# Patient Record
Sex: Female | Born: 1965 | Race: Black or African American | Hispanic: No | Smoking: Never smoker
Health system: Southern US, Community
[De-identification: ages and names within clinical notes are randomized; demographics above are authoritative.]

## PROBLEM LIST (undated history)

## (undated) DIAGNOSIS — M6282 Rhabdomyolysis: Secondary | ICD-10-CM

## (undated) DIAGNOSIS — I509 Heart failure, unspecified: Secondary | ICD-10-CM

## (undated) DIAGNOSIS — F819 Developmental disorder of scholastic skills, unspecified: Secondary | ICD-10-CM

## (undated) DIAGNOSIS — M1990 Unspecified osteoarthritis, unspecified site: Secondary | ICD-10-CM

## (undated) DIAGNOSIS — R413 Other amnesia: Secondary | ICD-10-CM

## (undated) DIAGNOSIS — I428 Other cardiomyopathies: Secondary | ICD-10-CM

## (undated) DIAGNOSIS — K589 Irritable bowel syndrome without diarrhea: Secondary | ICD-10-CM

## (undated) DIAGNOSIS — F419 Anxiety disorder, unspecified: Secondary | ICD-10-CM

## (undated) DIAGNOSIS — G9341 Metabolic encephalopathy: Secondary | ICD-10-CM

## (undated) DIAGNOSIS — A419 Sepsis, unspecified organism: Secondary | ICD-10-CM

## (undated) DIAGNOSIS — R109 Unspecified abdominal pain: Secondary | ICD-10-CM

## (undated) DIAGNOSIS — T7840XA Allergy, unspecified, initial encounter: Secondary | ICD-10-CM

## (undated) DIAGNOSIS — E785 Hyperlipidemia, unspecified: Secondary | ICD-10-CM

## (undated) DIAGNOSIS — F319 Bipolar disorder, unspecified: Secondary | ICD-10-CM

## (undated) DIAGNOSIS — K5900 Constipation, unspecified: Secondary | ICD-10-CM

## (undated) DIAGNOSIS — F32A Depression, unspecified: Secondary | ICD-10-CM

## (undated) DIAGNOSIS — F329 Major depressive disorder, single episode, unspecified: Secondary | ICD-10-CM

## (undated) DIAGNOSIS — I639 Cerebral infarction, unspecified: Secondary | ICD-10-CM

## (undated) DIAGNOSIS — G709 Myoneural disorder, unspecified: Secondary | ICD-10-CM

## (undated) DIAGNOSIS — I5042 Chronic combined systolic (congestive) and diastolic (congestive) heart failure: Secondary | ICD-10-CM

## (undated) DIAGNOSIS — G8929 Other chronic pain: Secondary | ICD-10-CM

## (undated) DIAGNOSIS — I10 Essential (primary) hypertension: Secondary | ICD-10-CM

## (undated) HISTORY — DX: Heart failure, unspecified: I50.9

## (undated) HISTORY — DX: Sepsis, unspecified organism: A41.9

## (undated) HISTORY — DX: Metabolic encephalopathy: G93.41

## (undated) HISTORY — DX: Other cardiomyopathies: I42.8

## (undated) HISTORY — DX: Rhabdomyolysis: M62.82

## (undated) HISTORY — DX: Bipolar disorder, unspecified: F31.9

## (undated) HISTORY — DX: Major depressive disorder, single episode, unspecified: F32.9

## (undated) HISTORY — DX: Chronic combined systolic (congestive) and diastolic (congestive) heart failure: I50.42

## (undated) HISTORY — PX: ABDOMINAL HYSTERECTOMY: SHX81

## (undated) HISTORY — DX: Unspecified osteoarthritis, unspecified site: M19.90

## (undated) HISTORY — DX: Hyperlipidemia, unspecified: E78.5

## (undated) HISTORY — DX: Cerebral infarction, unspecified: I63.9

## (undated) HISTORY — PX: GALLBLADDER SURGERY: SHX652

## (undated) HISTORY — DX: Allergy, unspecified, initial encounter: T78.40XA

## (undated) HISTORY — PX: KNEE SURGERY: SHX244

## (undated) HISTORY — DX: Myoneural disorder, unspecified: G70.9

---

## 1999-01-30 ENCOUNTER — Emergency Department (HOSPITAL_COMMUNITY): Admission: EM | Admit: 1999-01-30 | Discharge: 1999-01-30 | Payer: Self-pay | Admitting: Internal Medicine

## 1999-06-27 ENCOUNTER — Emergency Department (HOSPITAL_COMMUNITY): Admission: EM | Admit: 1999-06-27 | Discharge: 1999-06-27 | Payer: Self-pay | Admitting: Emergency Medicine

## 1999-09-26 ENCOUNTER — Emergency Department (HOSPITAL_COMMUNITY): Admission: EM | Admit: 1999-09-26 | Discharge: 1999-09-26 | Payer: Self-pay | Admitting: Emergency Medicine

## 1999-10-15 ENCOUNTER — Other Ambulatory Visit: Admission: RE | Admit: 1999-10-15 | Discharge: 1999-10-15 | Payer: Self-pay | Admitting: Family Medicine

## 1999-10-24 ENCOUNTER — Emergency Department (HOSPITAL_COMMUNITY): Admission: EM | Admit: 1999-10-24 | Discharge: 1999-10-24 | Payer: Self-pay

## 1999-10-31 ENCOUNTER — Encounter: Payer: Self-pay | Admitting: Family Medicine

## 1999-10-31 ENCOUNTER — Ambulatory Visit (HOSPITAL_COMMUNITY): Admission: RE | Admit: 1999-10-31 | Discharge: 1999-10-31 | Payer: Self-pay | Admitting: Family Medicine

## 1999-11-14 ENCOUNTER — Emergency Department (HOSPITAL_COMMUNITY): Admission: EM | Admit: 1999-11-14 | Discharge: 1999-11-14 | Payer: Self-pay

## 2000-07-01 ENCOUNTER — Emergency Department (HOSPITAL_COMMUNITY): Admission: EM | Admit: 2000-07-01 | Discharge: 2000-07-01 | Payer: Self-pay | Admitting: Emergency Medicine

## 2000-07-01 ENCOUNTER — Encounter: Payer: Self-pay | Admitting: Emergency Medicine

## 2000-12-13 ENCOUNTER — Emergency Department (HOSPITAL_COMMUNITY): Admission: EM | Admit: 2000-12-13 | Discharge: 2000-12-14 | Payer: Self-pay | Admitting: Emergency Medicine

## 2000-12-14 ENCOUNTER — Encounter: Payer: Self-pay | Admitting: Emergency Medicine

## 2000-12-29 ENCOUNTER — Ambulatory Visit (HOSPITAL_COMMUNITY): Admission: RE | Admit: 2000-12-29 | Discharge: 2000-12-29 | Payer: Self-pay | Admitting: Family Medicine

## 2000-12-29 ENCOUNTER — Encounter: Payer: Self-pay | Admitting: Family Medicine

## 2001-02-15 ENCOUNTER — Emergency Department (HOSPITAL_COMMUNITY): Admission: EM | Admit: 2001-02-15 | Discharge: 2001-02-15 | Payer: Self-pay | Admitting: Emergency Medicine

## 2001-02-15 ENCOUNTER — Encounter: Payer: Self-pay | Admitting: Emergency Medicine

## 2001-06-23 ENCOUNTER — Encounter: Admission: RE | Admit: 2001-06-23 | Discharge: 2001-06-23 | Payer: Self-pay | Admitting: Internal Medicine

## 2001-07-20 ENCOUNTER — Inpatient Hospital Stay (HOSPITAL_COMMUNITY): Admission: AD | Admit: 2001-07-20 | Discharge: 2001-07-20 | Payer: Self-pay | Admitting: Obstetrics

## 2001-09-08 ENCOUNTER — Encounter: Admission: RE | Admit: 2001-09-08 | Discharge: 2001-09-08 | Payer: Self-pay

## 2002-01-05 ENCOUNTER — Encounter: Admission: RE | Admit: 2002-01-05 | Discharge: 2002-01-05 | Payer: Self-pay | Admitting: Internal Medicine

## 2002-05-19 ENCOUNTER — Other Ambulatory Visit: Admission: RE | Admit: 2002-05-19 | Discharge: 2002-05-19 | Payer: Self-pay | Admitting: Family Medicine

## 2002-11-18 ENCOUNTER — Encounter: Payer: Self-pay | Admitting: Emergency Medicine

## 2002-11-18 ENCOUNTER — Emergency Department (HOSPITAL_COMMUNITY): Admission: EM | Admit: 2002-11-18 | Discharge: 2002-11-18 | Payer: Self-pay | Admitting: Emergency Medicine

## 2003-01-20 ENCOUNTER — Emergency Department (HOSPITAL_COMMUNITY): Admission: EM | Admit: 2003-01-20 | Discharge: 2003-01-20 | Payer: Self-pay | Admitting: Emergency Medicine

## 2003-02-15 ENCOUNTER — Other Ambulatory Visit: Admission: RE | Admit: 2003-02-15 | Discharge: 2003-02-15 | Payer: Self-pay | Admitting: Family Medicine

## 2003-02-17 ENCOUNTER — Encounter: Admission: RE | Admit: 2003-02-17 | Discharge: 2003-03-14 | Payer: Self-pay | Admitting: Family Medicine

## 2003-05-01 ENCOUNTER — Emergency Department (HOSPITAL_COMMUNITY): Admission: EM | Admit: 2003-05-01 | Discharge: 2003-05-01 | Payer: Self-pay | Admitting: Emergency Medicine

## 2003-05-23 ENCOUNTER — Emergency Department (HOSPITAL_COMMUNITY): Admission: EM | Admit: 2003-05-23 | Discharge: 2003-05-23 | Payer: Self-pay

## 2003-05-23 ENCOUNTER — Encounter: Payer: Self-pay | Admitting: Emergency Medicine

## 2003-05-26 ENCOUNTER — Other Ambulatory Visit: Admission: RE | Admit: 2003-05-26 | Discharge: 2003-05-26 | Payer: Self-pay | Admitting: *Deleted

## 2003-08-31 ENCOUNTER — Encounter: Admission: RE | Admit: 2003-08-31 | Discharge: 2003-08-31 | Payer: Self-pay | Admitting: *Deleted

## 2003-10-19 ENCOUNTER — Ambulatory Visit (HOSPITAL_COMMUNITY): Admission: RE | Admit: 2003-10-19 | Discharge: 2003-10-19 | Payer: Self-pay | Admitting: Obstetrics & Gynecology

## 2005-01-23 ENCOUNTER — Inpatient Hospital Stay (HOSPITAL_COMMUNITY): Admission: RE | Admit: 2005-01-23 | Discharge: 2005-01-30 | Payer: Self-pay | Admitting: Psychiatry

## 2005-01-23 ENCOUNTER — Ambulatory Visit: Payer: Self-pay | Admitting: Psychiatry

## 2005-01-23 ENCOUNTER — Encounter: Payer: Self-pay | Admitting: Emergency Medicine

## 2005-10-19 ENCOUNTER — Emergency Department (HOSPITAL_COMMUNITY): Admission: EM | Admit: 2005-10-19 | Discharge: 2005-10-20 | Payer: Self-pay | Admitting: Emergency Medicine

## 2006-05-20 ENCOUNTER — Emergency Department (HOSPITAL_COMMUNITY): Admission: EM | Admit: 2006-05-20 | Discharge: 2006-05-20 | Payer: Self-pay | Admitting: Emergency Medicine

## 2006-06-08 ENCOUNTER — Ambulatory Visit (HOSPITAL_COMMUNITY): Admission: RE | Admit: 2006-06-08 | Discharge: 2006-06-08 | Payer: Self-pay | Admitting: Obstetrics

## 2006-06-25 ENCOUNTER — Encounter (INDEPENDENT_AMBULATORY_CARE_PROVIDER_SITE_OTHER): Payer: Self-pay | Admitting: Specialist

## 2006-06-25 ENCOUNTER — Inpatient Hospital Stay (HOSPITAL_COMMUNITY): Admission: RE | Admit: 2006-06-25 | Discharge: 2006-06-28 | Payer: Self-pay | Admitting: Obstetrics

## 2007-07-28 ENCOUNTER — Emergency Department (HOSPITAL_COMMUNITY): Admission: EM | Admit: 2007-07-28 | Discharge: 2007-07-29 | Payer: Self-pay | Admitting: Emergency Medicine

## 2007-09-04 ENCOUNTER — Encounter: Admission: RE | Admit: 2007-09-04 | Discharge: 2007-09-04 | Payer: Self-pay | Admitting: Family Medicine

## 2007-12-24 ENCOUNTER — Emergency Department (HOSPITAL_COMMUNITY): Admission: EM | Admit: 2007-12-24 | Discharge: 2007-12-24 | Payer: Self-pay | Admitting: Emergency Medicine

## 2008-01-03 ENCOUNTER — Emergency Department (HOSPITAL_COMMUNITY): Admission: EM | Admit: 2008-01-03 | Discharge: 2008-01-03 | Payer: Self-pay | Admitting: Emergency Medicine

## 2008-03-28 ENCOUNTER — Emergency Department (HOSPITAL_COMMUNITY): Admission: EM | Admit: 2008-03-28 | Discharge: 2008-03-28 | Payer: Self-pay | Admitting: Emergency Medicine

## 2008-03-30 ENCOUNTER — Ambulatory Visit (HOSPITAL_COMMUNITY): Admission: RE | Admit: 2008-03-30 | Discharge: 2008-03-30 | Payer: Self-pay | Admitting: Emergency Medicine

## 2008-06-13 ENCOUNTER — Emergency Department (HOSPITAL_COMMUNITY): Admission: EM | Admit: 2008-06-13 | Discharge: 2008-06-14 | Payer: Self-pay | Admitting: Emergency Medicine

## 2008-06-30 ENCOUNTER — Ambulatory Visit (HOSPITAL_COMMUNITY): Admission: RE | Admit: 2008-06-30 | Discharge: 2008-06-30 | Payer: Self-pay | Admitting: Obstetrics

## 2008-10-09 ENCOUNTER — Ambulatory Visit (HOSPITAL_COMMUNITY): Admission: RE | Admit: 2008-10-09 | Discharge: 2008-10-09 | Payer: Self-pay | Admitting: Obstetrics

## 2008-12-28 ENCOUNTER — Encounter: Payer: Self-pay | Admitting: Physician Assistant

## 2009-01-02 ENCOUNTER — Encounter: Admission: RE | Admit: 2009-01-02 | Discharge: 2009-01-02 | Payer: Self-pay | Admitting: Gastroenterology

## 2009-05-10 ENCOUNTER — Emergency Department (HOSPITAL_COMMUNITY): Admission: EM | Admit: 2009-05-10 | Discharge: 2009-05-11 | Payer: Self-pay | Admitting: Emergency Medicine

## 2009-06-20 ENCOUNTER — Emergency Department (HOSPITAL_COMMUNITY): Admission: EM | Admit: 2009-06-20 | Discharge: 2009-06-20 | Payer: Self-pay | Admitting: Emergency Medicine

## 2009-06-29 ENCOUNTER — Telehealth: Payer: Self-pay | Admitting: Gastroenterology

## 2009-07-03 ENCOUNTER — Ambulatory Visit: Payer: Self-pay | Admitting: Gastroenterology

## 2009-07-03 ENCOUNTER — Telehealth (INDEPENDENT_AMBULATORY_CARE_PROVIDER_SITE_OTHER): Payer: Self-pay | Admitting: *Deleted

## 2009-07-03 DIAGNOSIS — Z860100 Personal history of colon polyps, unspecified: Secondary | ICD-10-CM | POA: Insufficient documentation

## 2009-07-03 DIAGNOSIS — K5732 Diverticulitis of large intestine without perforation or abscess without bleeding: Secondary | ICD-10-CM | POA: Insufficient documentation

## 2009-07-03 DIAGNOSIS — J90 Pleural effusion, not elsewhere classified: Secondary | ICD-10-CM | POA: Insufficient documentation

## 2009-07-03 DIAGNOSIS — I10 Essential (primary) hypertension: Secondary | ICD-10-CM | POA: Insufficient documentation

## 2009-07-03 DIAGNOSIS — R1032 Left lower quadrant pain: Secondary | ICD-10-CM | POA: Insufficient documentation

## 2009-07-03 DIAGNOSIS — I517 Cardiomegaly: Secondary | ICD-10-CM | POA: Insufficient documentation

## 2009-07-03 DIAGNOSIS — R109 Unspecified abdominal pain: Secondary | ICD-10-CM | POA: Insufficient documentation

## 2009-07-04 DIAGNOSIS — N760 Acute vaginitis: Secondary | ICD-10-CM | POA: Insufficient documentation

## 2009-07-04 DIAGNOSIS — F411 Generalized anxiety disorder: Secondary | ICD-10-CM | POA: Insufficient documentation

## 2009-07-04 DIAGNOSIS — N83209 Unspecified ovarian cyst, unspecified side: Secondary | ICD-10-CM

## 2009-07-04 LAB — CONVERTED CEMR LAB
Basophils Relative: 0.9 % (ref 0.0–3.0)
Eosinophils Absolute: 0.5 10*3/uL (ref 0.0–0.7)
Hemoglobin: 14.4 g/dL (ref 12.0–15.0)
Lymphs Abs: 2.9 10*3/uL (ref 0.7–4.0)
MCHC: 34.4 g/dL (ref 30.0–36.0)
MCV: 96.7 fL (ref 78.0–100.0)
Monocytes Absolute: 0.6 10*3/uL (ref 0.1–1.0)
Neutro Abs: 2.8 10*3/uL (ref 1.4–7.7)
RBC: 4.32 M/uL (ref 3.87–5.11)

## 2009-07-05 ENCOUNTER — Ambulatory Visit: Payer: Self-pay | Admitting: Cardiovascular Disease

## 2009-07-05 DIAGNOSIS — R0602 Shortness of breath: Secondary | ICD-10-CM

## 2009-07-10 ENCOUNTER — Telehealth: Payer: Self-pay | Admitting: Gastroenterology

## 2009-07-16 ENCOUNTER — Ambulatory Visit: Payer: Self-pay

## 2009-07-16 ENCOUNTER — Ambulatory Visit: Payer: Self-pay | Admitting: Cardiology

## 2009-07-16 ENCOUNTER — Encounter: Payer: Self-pay | Admitting: Cardiovascular Disease

## 2009-07-16 ENCOUNTER — Ambulatory Visit (HOSPITAL_COMMUNITY): Admission: RE | Admit: 2009-07-16 | Discharge: 2009-07-16 | Payer: Self-pay | Admitting: Cardiovascular Disease

## 2009-07-17 ENCOUNTER — Telehealth: Payer: Self-pay | Admitting: Gastroenterology

## 2009-07-21 ENCOUNTER — Telehealth: Payer: Self-pay | Admitting: Gastroenterology

## 2009-07-24 ENCOUNTER — Telehealth (INDEPENDENT_AMBULATORY_CARE_PROVIDER_SITE_OTHER): Payer: Self-pay | Admitting: *Deleted

## 2009-07-24 ENCOUNTER — Ambulatory Visit: Payer: Self-pay | Admitting: Gastroenterology

## 2009-07-24 LAB — CONVERTED CEMR LAB
AST: 16 units/L (ref 0–37)
Albumin: 4.3 g/dL (ref 3.5–5.2)
Alkaline Phosphatase: 49 units/L (ref 39–117)
BUN: 6 mg/dL (ref 6–23)
Eosinophils Absolute: 0.3 10*3/uL (ref 0.0–0.7)
MCHC: 34.6 g/dL (ref 30.0–36.0)
MCV: 94.7 fL (ref 78.0–100.0)
Monocytes Absolute: 0.4 10*3/uL (ref 0.1–1.0)
Neutrophils Relative %: 50.8 % (ref 43.0–77.0)
Platelets: 254 10*3/uL (ref 150.0–400.0)
Potassium: 3.2 meq/L — ABNORMAL LOW (ref 3.5–5.1)
Sodium: 144 meq/L (ref 135–145)
Total Bilirubin: 1.4 mg/dL — ABNORMAL HIGH (ref 0.3–1.2)

## 2009-07-27 ENCOUNTER — Telehealth (INDEPENDENT_AMBULATORY_CARE_PROVIDER_SITE_OTHER): Payer: Self-pay | Admitting: *Deleted

## 2009-07-27 ENCOUNTER — Ambulatory Visit (HOSPITAL_COMMUNITY): Admission: RE | Admit: 2009-07-27 | Discharge: 2009-07-27 | Payer: Self-pay | Admitting: Gastroenterology

## 2009-07-30 ENCOUNTER — Telehealth (INDEPENDENT_AMBULATORY_CARE_PROVIDER_SITE_OTHER): Payer: Self-pay | Admitting: *Deleted

## 2009-08-01 ENCOUNTER — Ambulatory Visit (HOSPITAL_COMMUNITY): Admission: RE | Admit: 2009-08-01 | Discharge: 2009-08-01 | Payer: Self-pay | Admitting: Gastroenterology

## 2009-08-02 ENCOUNTER — Telehealth: Payer: Self-pay | Admitting: Internal Medicine

## 2009-08-03 ENCOUNTER — Telehealth: Payer: Self-pay | Admitting: Gastroenterology

## 2009-08-09 ENCOUNTER — Telehealth: Payer: Self-pay | Admitting: Gastroenterology

## 2009-08-09 ENCOUNTER — Emergency Department (HOSPITAL_COMMUNITY): Admission: EM | Admit: 2009-08-09 | Discharge: 2009-08-09 | Payer: Self-pay | Admitting: Emergency Medicine

## 2009-09-28 ENCOUNTER — Emergency Department (HOSPITAL_COMMUNITY): Admission: EM | Admit: 2009-09-28 | Discharge: 2009-09-29 | Payer: Self-pay | Admitting: Emergency Medicine

## 2009-10-12 ENCOUNTER — Ambulatory Visit (HOSPITAL_BASED_OUTPATIENT_CLINIC_OR_DEPARTMENT_OTHER): Admission: RE | Admit: 2009-10-12 | Discharge: 2009-10-12 | Payer: Self-pay | Admitting: Urology

## 2010-01-01 ENCOUNTER — Ambulatory Visit (HOSPITAL_BASED_OUTPATIENT_CLINIC_OR_DEPARTMENT_OTHER): Admission: RE | Admit: 2010-01-01 | Discharge: 2010-01-01 | Payer: Self-pay | Admitting: Urology

## 2010-02-18 IMAGING — CT CT PELVIS W/ CM
2 of 5 series · 17 of 46 positions shown, 19 images · IV contrast (APPLIED)
Comparison: [DATE].

CT ABDOMEN

CLINICAL DATA: Left-sided abdominal pain.

CT ABDOMEN AND PELVIS WITH CONTRAST
TECHNIQUE: Multidetector CT imaging of the abdomen and pelvis was
performed using the standard protocol following bolus
administration of intravenous contrast.
Contrast: 100 ml Omnipaque 300.

[Series 2: abd/pelv with 5.0 b31f st · axial · 0.71mm/px · z∈[-474,-44]mm · 14 of 98 slices shown, 16 images]
[im 6/98  soft-tissue]
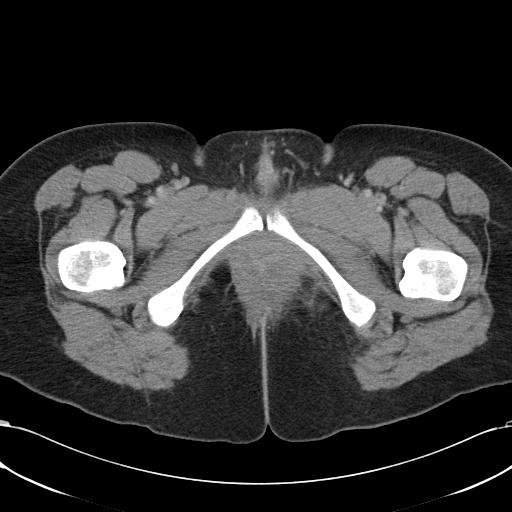
[im 6/98  bone]
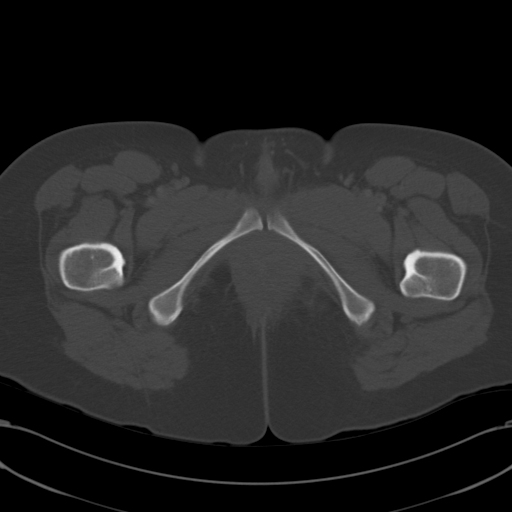
[im 11/98  soft-tissue]
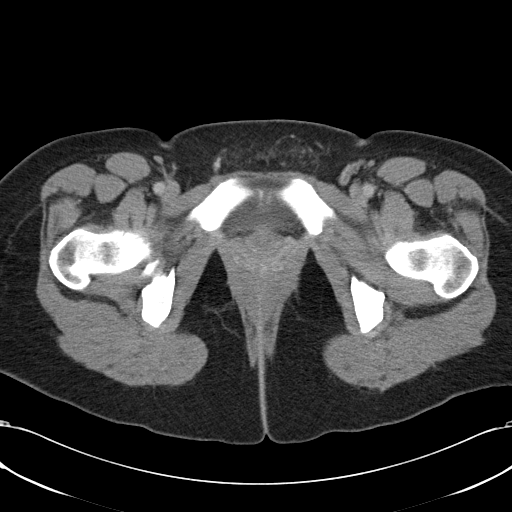
[im 21/98  soft-tissue]
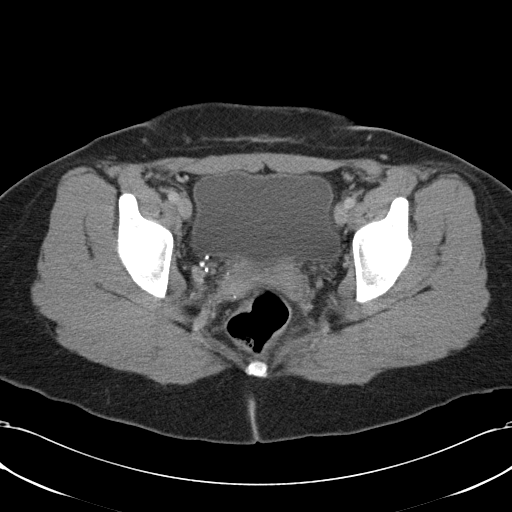
[im 26/98  soft-tissue]
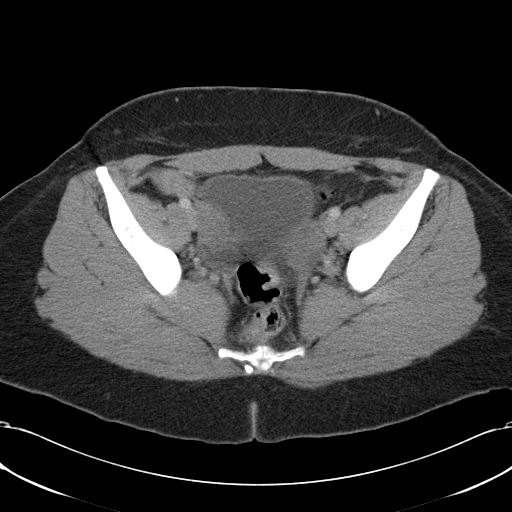
[im 31/98  soft-tissue]
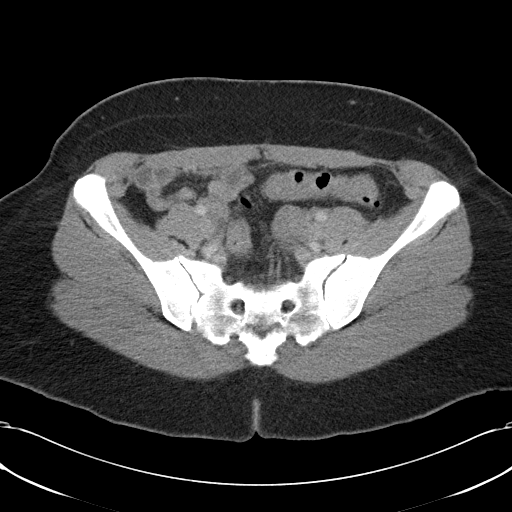
[im 41/98  soft-tissue]
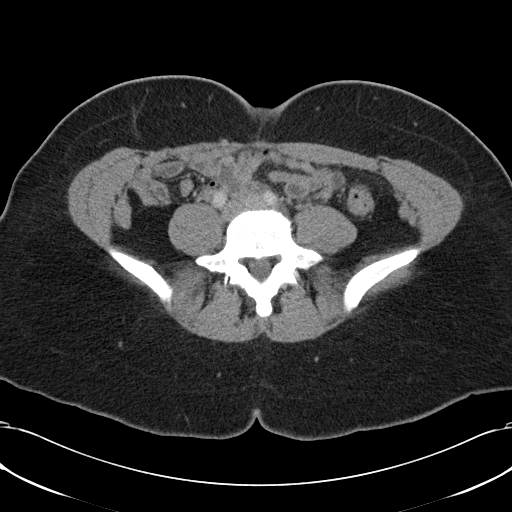
[im 46/98  soft-tissue]
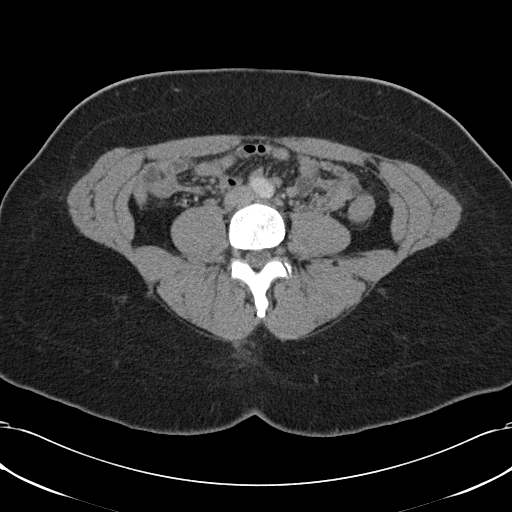
[im 52/98  soft-tissue]
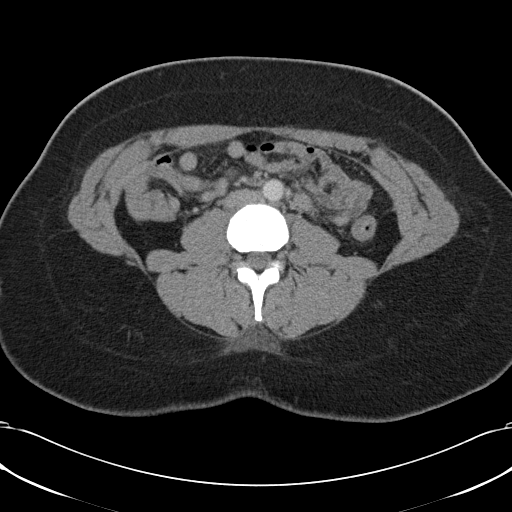
[im 57/98  soft-tissue]
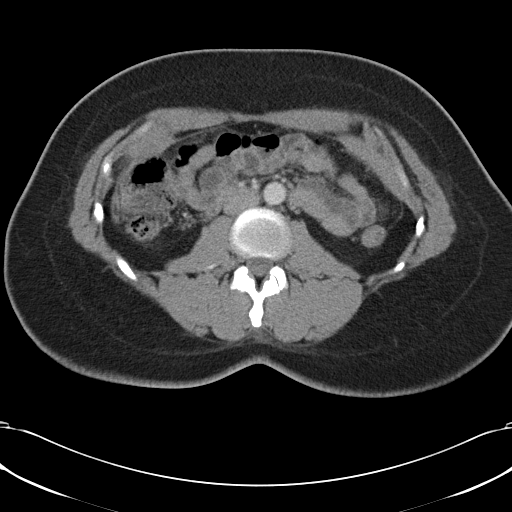
[im 57/98  bone]
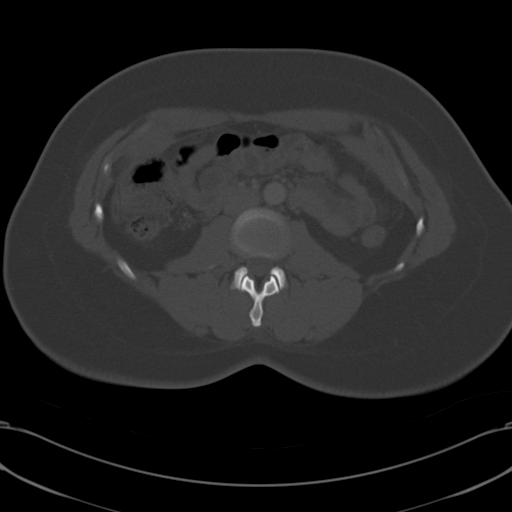
[im 67/98  soft-tissue]
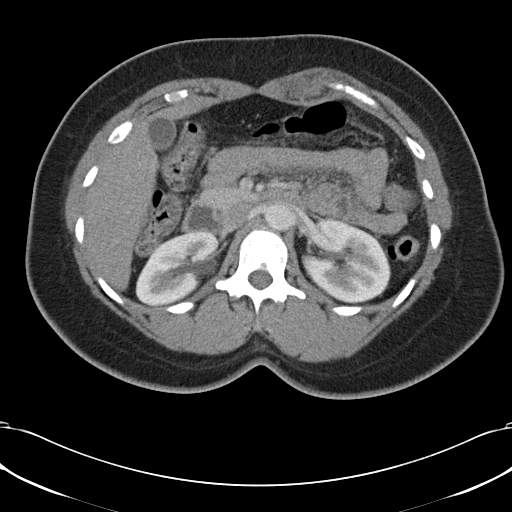
[im 72/98  soft-tissue]
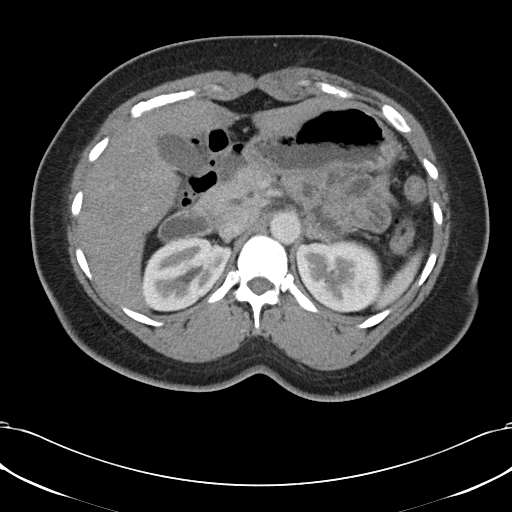
[im 77/98  soft-tissue]
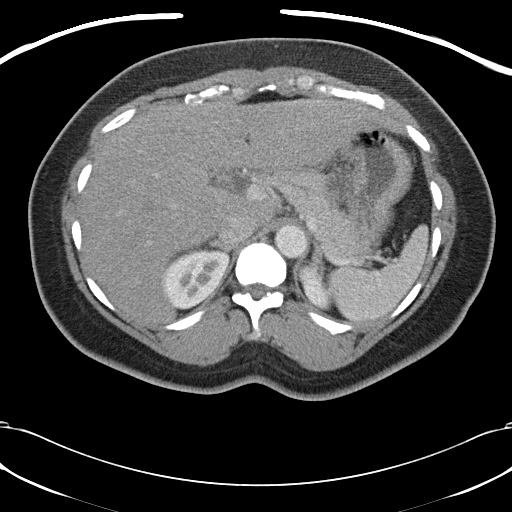
[im 87/98  soft-tissue]
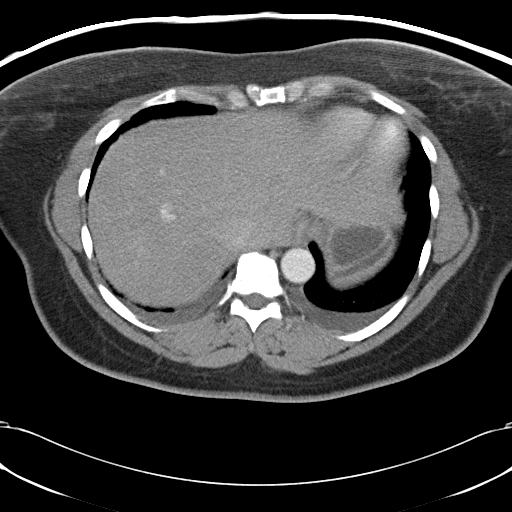
[im 92/98  soft-tissue]
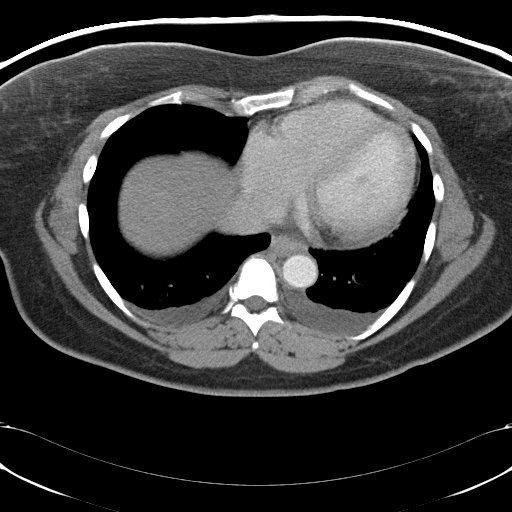

[Series 6: abd/pelv with 3.0 spo cor st · coronal · 0.96mm/px · 3 of 69 slices shown]
[im 23/69  soft-tissue]
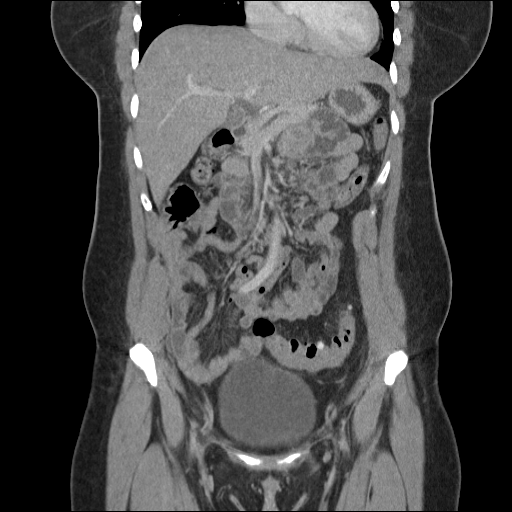
[im 31/69  soft-tissue]
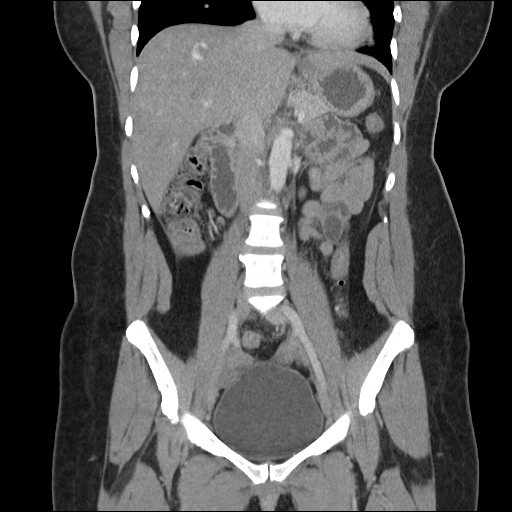
[im 38/69  soft-tissue]
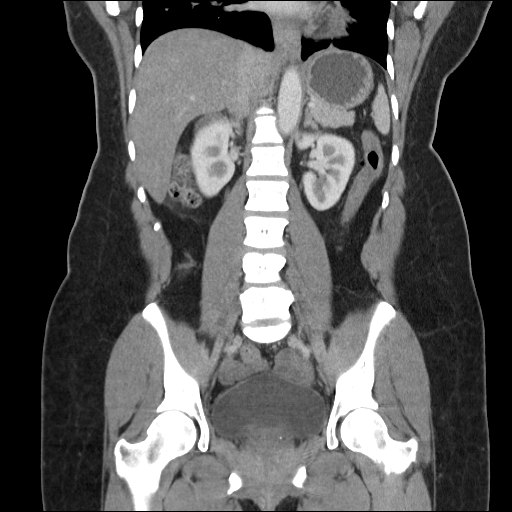

[17 of 46 positions shown; findings below may reference images not displayed]

FINDINGS: Bilateral pleural effusions are present.  There is
associated atelectasis.  This is similar to the prior study.  The
heart is enlarged.  No significant pericardial effusion is present.

The hypodense lesion near the dome the liver demonstrates
peripheral enhancement, compatible with a hemangioma.  Mild extra
panic biliary dilation is stable, with the common bile duct
measuring 8 mm.  The stomach, duodenum, and pancreas are within
normal limits.  No obstructing lesion is identified.  The
gallbladder is unremarkable.  Minimal renal scarring is stable.

Bone windows are unremarkable.
IMPRESSION: 1.  Stable appearance of small bilateral pleural effusions and
associated atelectasis.
2.  A hemangioma at the dome of the liver.
3.  No acute abnormality of the abdomen.
4.  Stable mild extrahepatic biliary dilation without focal
obstructive lesion identified.

CT PELVIS
FINDINGS: Diverticular changes are again seen within the sigmoid
colon.  There is wall thickening and mild pericolonic stranding,
suggesting residual or recurrent diverticulitis.  There is no free
air or significant fluid collection to suggest abscess.  The
remainder of the colon is normal.  The appendix is visualized and
within normal limits.  The urinary bladder is within normal limits.
No significant pelvic adenopathy or free fluid is evident.  The
bone windows are unremarkable.
IMPRESSION: 1.  Sigmoid diverticulosis.
2.  Mild wall thickening and persistent stranding at the junction
of the descending and sigmoid colon, suggesting mild
diverticulitis.

## 2010-05-01 ENCOUNTER — Telehealth (INDEPENDENT_AMBULATORY_CARE_PROVIDER_SITE_OTHER): Payer: Self-pay | Admitting: *Deleted

## 2010-06-04 ENCOUNTER — Emergency Department (HOSPITAL_COMMUNITY): Admission: EM | Admit: 2010-06-04 | Discharge: 2010-06-04 | Payer: Self-pay | Admitting: Emergency Medicine

## 2010-08-21 ENCOUNTER — Ambulatory Visit
Admission: RE | Admit: 2010-08-21 | Discharge: 2010-08-21 | Payer: Self-pay | Source: Home / Self Care | Attending: Gastroenterology | Admitting: Gastroenterology

## 2010-08-22 ENCOUNTER — Telehealth (INDEPENDENT_AMBULATORY_CARE_PROVIDER_SITE_OTHER): Payer: Self-pay | Admitting: *Deleted

## 2010-08-22 ENCOUNTER — Encounter: Payer: Self-pay | Admitting: Gastroenterology

## 2010-09-07 ENCOUNTER — Encounter: Payer: Self-pay | Admitting: Family Medicine

## 2010-09-08 ENCOUNTER — Encounter: Payer: Self-pay | Admitting: Family Medicine

## 2010-09-08 ENCOUNTER — Encounter: Payer: Self-pay | Admitting: Obstetrics

## 2010-09-08 ENCOUNTER — Encounter: Payer: Self-pay | Admitting: Gastroenterology

## 2010-09-17 NOTE — Progress Notes (Signed)
  Phone Note Outgoing Call   Call placed by: Deliah Goody, RN,  May 01, 2010 8:46 AM Summary of Call: SPOKE WITH PT, IN 11-10 PT HAD A CXR WITH SMALL EFFUSIONS. PER DR NISHAN REPEAT CXR ORDERED TO MAKE SURE HAS CLEARED UP. PT WILL GO TO THE XRAY DEPARTMENT AT THE [facility] TOMORROW FOR REPEAT SCAN. Initial call taken by: Deliah Goody, RN,  May 01, 2010 8:48 AM

## 2010-09-19 NOTE — Assessment & Plan Note (Signed)
Review of gastrointestinal problems: 1. Left (sigmoid/descending) diverticulitis noted on CT scans (two of them) Fall 2010.  Was under care of Dr. Jeani Hawking, did not follow up with him due to billing issues.  Colonoscopy May 2010 found diverticulosis, single small TA (Dr. Elnoria Howard).  Has LLQ pains, cipro/flagyl course (twice previously) and now on her second course of Augmentin. 2. Adenomatous colon polyps, Dr. Elnoria Howard colonoscopy 12/2008.  needs repeat colonoscopy 5, 2015. 3. left ovarian complex cyst; noted 07/2009, sent to gynecology.  ?cause of recent pains.   History of Present Illness Visit Type: Follow-up Visit Primary GI MD: Rob Bunting MD Primary Provider: Cleda Daub, MD Chief Complaint: abdominal pain History of Present Illness:     45 year old woman whom I last saw about a year ago, she was having abd pains.  I found she had a left ovarian cyst. She was seen by Dr. Henderson Cloud, was supposed to go back but she did not.  she has not followup with me since then  she came to the conclusion that she may have IBS.  she has a BM about once every 4 times a month.   She only takes prune juice.  fi ber supplements do not help.           Current Medications (verified): 1)  Alprazolam 0.5 Mg Tabs (Alprazolam) .Marland Kitchen.. 1 Tablet By Mouth Three Times A Day 2)  Doxazosin Mesylate 4 Mg Tabs (Doxazosin Mesylate) .Marland Kitchen.. 1 Tablet By Mouth Once Daily 3)  Hydrochlorothiazide 25 Mg Tabs (Hydrochlorothiazide) .Marland Kitchen.. 1 Tablet By Mouth Once Daily  Allergies (verified): 1)  ! Pcn  Vital Signs:  Patient profile:   45 year old female Height:      64 inches Weight:      184.25 pounds BMI:     31.74 Pulse rate:   84 / minute Pulse rhythm:   regular BP sitting:   110 / 68  (left arm) Cuff size:   regular  Vitals Entered By: June McMurray CMA Duncan Dull) (August 21, 2010 2:08 PM)  Physical Exam  Additional Exam:  Constitutional: generally well appearing Psychiatric: alert and oriented times 3 Abdomen:  soft, non-tender, non-distended, normal bowel sounds    Impression & Recommendations:  Problem # 1:  Chronic constipation she was quite difficult in the exam room today. I recommended she try MiraLax 2-3 doses a day and at first she told me she only took one dose a day but then later on she said she had taken many doses a day in the distal doesn't work. She really wanted a pill for this. I eventually agreed to write her a prescription for a trial of amitiza at 8 micrograms twice daily. She will return to see me in 6-7 weeks and sooner if needed.  Of note she has not followed up with her gynecologist I referred to. I recommended she do that since it does sound like he wanted to see her again for this complex ovarian cyst.  Patient Instructions: 1)  Stay on the prune juice. 2)  Start taking miralax, start with 3 doses a day.  Since you will not try this medicine then will give a prescription for amitiza. 3)  You should get back in with Dr. Henderson Cloud for the ovarian cyst. 4)  Return to see Dr. Christella Hartigan in 6-7 weeks. 5)  The medication list was reviewed and reconciled.  All changed / newly prescribed medications were explained.  A complete medication list was provided to the patient / caregiver.  Prescriptions: AMITIZA 8 MCG  CAPS (LUBIPROSTONE) 1 two times a day/take with food and water  #60 x 1   Entered and Authorized by:   Rachael Fee MD   Signed by:   Rachael Fee MD on 08/21/2010   Method used:   Electronically to        [facility] [phone]* (retail)       [address]       [place], [place]  [address]       Ph: [phone]       Fax: [phone]   RxID:   [id number]

## 2010-09-19 NOTE — Progress Notes (Signed)
Summary: Amitiza prior auth  Phone Note Other Incoming   Summary of Call: Prior auth recieved for Amitiza and was completed over the phone response to be recieved in about 2 buisness days Initial call taken by: Chales Abrahams CMA (AAMA),  August 22, 2010 9:20 AM     Appended Document: Amitiza prior auth Dr Christella Hartigan the pt's insurance has denied Amitiza and states she has to have Lactulose first and then if that fails they will cover Amitiza.  I advised the pt and she said she would try the lactulose if you want her to.  Should I send a rx for it?  Appended Document: Amitiza prior auth yes, have her take lactulose 30cc, twice daily.  dispense one month, refill 6 times  thanks  Appended Document: Amitiza prior auth pt aware rx sent   Clinical Lists Changes  Medications: Added new medication of LACTULOSE 10 GM/15ML SOLN (LACTULOSE) 30 cc two times a day - Signed Rx of LACTULOSE 10 GM/15ML SOLN (LACTULOSE) 30 cc two times a day;  #1 month x 6;  Signed;  Entered by: Chales Abrahams CMA (AAMA);  Authorized by: Rachael Fee MD;  Method used: Electronically to [facility] [phone]*, [address], [place], [place]  [address], Ph: [phone], Fax: [phone]    Prescriptions: LACTULOSE 10 GM/15ML SOLN (LACTULOSE) 30 cc two times a day  #1 month x 6   Entered by:   Chales Abrahams CMA (AAMA)   Authorized by:   Rachael Fee MD   Signed by:   Chales Abrahams CMA (AAMA) on 08/22/2010   Method used:   Electronically to        [facility] [phone]* (retail)       [address]       [place], [place]  [address]       Ph: [phone]       Fax: [phone]   RxID:   [id number]

## 2010-09-25 ENCOUNTER — Ambulatory Visit: Payer: Self-pay | Attending: Gynecologic Oncology | Admitting: Gynecologic Oncology

## 2010-09-25 NOTE — Medication Information (Signed)
Summary: Amitiza Denied / [employer]  Amitiza Denied / [employer]   Imported By: Lennie Odor 09/17/2010 14:54:50  _____________________________________________________________________  External Attachment:    Type:   Image     Comment:   External Document

## 2010-10-30 LAB — POCT I-STAT, CHEM 8
BUN: 16 mg/dL (ref 6–23)
Chloride: 102 mEq/L (ref 96–112)
Creatinine, Ser: 0.9 mg/dL (ref 0.4–1.2)
Potassium: 4 mEq/L (ref 3.5–5.1)
Sodium: 140 mEq/L (ref 135–145)
TCO2: 28 mmol/L (ref 0–100)

## 2010-10-30 LAB — URINALYSIS, ROUTINE W REFLEX MICROSCOPIC
Glucose, UA: NEGATIVE mg/dL
Hgb urine dipstick: NEGATIVE
Protein, ur: NEGATIVE mg/dL
pH: 8 (ref 5.0–8.0)

## 2010-10-30 LAB — HEMOCCULT GUIAC POC 1CARD (OFFICE): Fecal Occult Bld: NEGATIVE

## 2010-11-04 LAB — POCT I-STAT, CHEM 8
BUN: 9 mg/dL (ref 6–23)
Chloride: 103 mEq/L (ref 96–112)
Creatinine, Ser: 0.8 mg/dL (ref 0.4–1.2)
Potassium: 3 mEq/L — ABNORMAL LOW (ref 3.5–5.1)
Sodium: 141 mEq/L (ref 135–145)

## 2010-11-08 LAB — URINALYSIS, ROUTINE W REFLEX MICROSCOPIC
Glucose, UA: NEGATIVE mg/dL
Hgb urine dipstick: NEGATIVE
Specific Gravity, Urine: 1.014 (ref 1.005–1.030)
pH: 6 (ref 5.0–8.0)

## 2010-11-08 LAB — DIFFERENTIAL
Basophils Absolute: 0.1 10*3/uL (ref 0.0–0.1)
Eosinophils Relative: 7 % — ABNORMAL HIGH (ref 0–5)
Lymphocytes Relative: 44 % (ref 12–46)

## 2010-11-08 LAB — BASIC METABOLIC PANEL
CO2: 30 mEq/L (ref 19–32)
Chloride: 101 mEq/L (ref 96–112)
Creatinine, Ser: 0.96 mg/dL (ref 0.4–1.2)
GFR calc Af Amer: >60 mL/min (ref >60)
Sodium: 142 mEq/L (ref 135–145)

## 2010-11-08 LAB — CBC
HCT: 41.3 % (ref 36.0–46.0)
Platelets: 316 10*3/uL (ref 150–400)
RDW: 13.1 % (ref 11.5–15.5)
WBC: 9.7 10*3/uL (ref 4.0–10.5)

## 2010-11-08 LAB — POCT I-STAT 4, (NA,K, GLUC, HGB,HCT)
HCT: 38 % (ref 36.0–46.0)
Sodium: 139 mEq/L (ref 135–145)

## 2010-11-18 LAB — COMPREHENSIVE METABOLIC PANEL
Alkaline Phosphatase: 66 U/L (ref 39–117)
BUN: 9 mg/dL (ref 6–23)
Chloride: 99 mEq/L (ref 96–112)
Glucose, Bld: 102 mg/dL — ABNORMAL HIGH (ref 70–99)
Potassium: 3.1 mEq/L — ABNORMAL LOW (ref 3.5–5.1)
Total Bilirubin: 1.2 mg/dL (ref 0.3–1.2)

## 2010-11-18 LAB — CBC
HCT: 39.7 % (ref 36.0–46.0)
Hemoglobin: 13.3 g/dL (ref 12.0–15.0)
RBC: 4.19 MIL/uL (ref 3.87–5.11)
WBC: 7.1 10*3/uL (ref 4.0–10.5)

## 2010-11-18 LAB — URINALYSIS, ROUTINE W REFLEX MICROSCOPIC
Glucose, UA: NEGATIVE mg/dL
Specific Gravity, Urine: 1.017 (ref 1.005–1.030)
pH: 6 (ref 5.0–8.0)

## 2010-11-18 LAB — DIFFERENTIAL
Basophils Absolute: 0 10*3/uL (ref 0.0–0.1)
Basophils Relative: 1 % (ref 0–1)
Eosinophils Absolute: 0.6 10*3/uL (ref 0.0–0.7)
Monocytes Absolute: 0.5 10*3/uL (ref 0.1–1.0)
Neutro Abs: 2.6 10*3/uL (ref 1.7–7.7)
Neutrophils Relative %: 36 % — ABNORMAL LOW (ref 43–77)

## 2010-11-20 LAB — BASIC METABOLIC PANEL
BUN: 8 mg/dL (ref 6–23)
Calcium: 10 mg/dL (ref 8.4–10.5)
GFR calc non Af Amer: >60 mL/min (ref >60)
Potassium: 3.4 mEq/L — ABNORMAL LOW (ref 3.5–5.1)
Sodium: 140 mEq/L (ref 135–145)

## 2010-11-20 LAB — URINE MICROSCOPIC-ADD ON

## 2010-11-20 LAB — DIFFERENTIAL
Basophils Absolute: 0 10*3/uL (ref 0.0–0.1)
Eosinophils Relative: 4 % (ref 0–5)
Lymphocytes Relative: 42 % (ref 12–46)
Lymphs Abs: 3.7 10*3/uL (ref 0.7–4.0)
Neutro Abs: 4.3 10*3/uL (ref 1.7–7.7)

## 2010-11-20 LAB — URINALYSIS, ROUTINE W REFLEX MICROSCOPIC
Glucose, UA: NEGATIVE mg/dL
Ketones, ur: 15 mg/dL — AB
Nitrite: NEGATIVE
Specific Gravity, Urine: 1.026 (ref 1.005–1.030)
pH: 6 (ref 5.0–8.0)

## 2010-11-20 LAB — CBC
HCT: 41.2 % (ref 36.0–46.0)
Platelets: 289 10*3/uL (ref 150–400)
WBC: 8.9 10*3/uL (ref 4.0–10.5)

## 2010-11-22 LAB — COMPREHENSIVE METABOLIC PANEL
ALT: 14 U/L (ref 0–35)
AST: 20 U/L (ref 0–37)
CO2: 29 mEq/L (ref 19–32)
Chloride: 98 mEq/L (ref 96–112)
Creatinine, Ser: 1.04 mg/dL (ref 0.4–1.2)
GFR calc Af Amer: >60 mL/min (ref >60)
GFR calc non Af Amer: 58 mL/min — ABNORMAL LOW (ref >60)
Glucose, Bld: 80 mg/dL (ref 70–99)
Sodium: 135 mEq/L (ref 135–145)
Total Bilirubin: 1 mg/dL (ref 0.3–1.2)

## 2010-11-22 LAB — URINALYSIS, ROUTINE W REFLEX MICROSCOPIC
Hgb urine dipstick: NEGATIVE
Specific Gravity, Urine: 1.022 (ref 1.005–1.030)
Urobilinogen, UA: 1 mg/dL (ref 0.0–1.0)
pH: 6 (ref 5.0–8.0)

## 2010-11-22 LAB — CBC
Hemoglobin: 12.4 g/dL (ref 12.0–15.0)
MCV: 95.9 fL (ref 78.0–100.0)
RBC: 3.78 MIL/uL — ABNORMAL LOW (ref 3.87–5.11)
WBC: 10.6 10*3/uL — ABNORMAL HIGH (ref 4.0–10.5)

## 2010-11-22 LAB — DIFFERENTIAL
Basophils Absolute: 0.1 10*3/uL (ref 0.0–0.1)
Eosinophils Absolute: 0.9 10*3/uL — ABNORMAL HIGH (ref 0.0–0.7)
Eosinophils Relative: 8 % — ABNORMAL HIGH (ref 0–5)

## 2010-11-22 LAB — AMYLASE: Amylase: 66 U/L (ref 27–131)

## 2010-12-25 ENCOUNTER — Emergency Department (HOSPITAL_COMMUNITY)
Admission: EM | Admit: 2010-12-25 | Discharge: 2010-12-25 | Disposition: A | Discharge status: Home / Self Care | Payer: Medicare Other | Attending: Emergency Medicine | Admitting: Emergency Medicine

## 2010-12-25 DIAGNOSIS — I10 Essential (primary) hypertension: Secondary | ICD-10-CM | POA: Insufficient documentation

## 2010-12-25 DIAGNOSIS — L02519 Cutaneous abscess of unspecified hand: Secondary | ICD-10-CM | POA: Insufficient documentation

## 2010-12-25 DIAGNOSIS — L03019 Cellulitis of unspecified finger: Secondary | ICD-10-CM | POA: Insufficient documentation

## 2011-01-03 NOTE — H&P (Signed)
NAME:  Dorothy Alvarez, Dorothy Alvarez NO.:  [id number]   MEDICAL RECORD NO.:  [id number]          PATIENT TYPE:  IPS   LOCATION:  0307                          FACILITY:  [facility]   PHYSICIAN:  Jeanice Lim, M.D. DATE OF BIRTH:  [birth year]   DATE OF ADMISSION:  01/23/2005  DATE OF DISCHARGE:                         PSYCHIATRIC ADMISSION ASSESSMENT   IDENTIFYING INFORMATION:  This is a 45 year old single African-American  female voluntarily admitted on January 23, 2005.   HISTORY OF PRESENT ILLNESS:  The patient presents with a history of  depression.  The patient states that she just needed to get away.  She  states that she had taken herself to the emergency department with  complaints of arm pain, started crying, feeling very angry.  The patient  reports she has problems with self-esteem.  She states that her life is  feeling empty.  Also has to take care of the children.  Also experienced  some homicidal ideation towards no one in particular.  Her sleep has been  satisfactory.  She sleeps well with her medication.  She reports very vivid  nightmares.  The patient states her stressors go way back to 1995.  She  never felt she should have been going to mental health where she had never  been started on medication.  She denies any hallucinations.  She reports a  history of cocaine use for about six months to a year.   PAST PSYCHIATRIC HISTORY:  This is the patient's first admission to  [facility].  Sees Dr. Mila Homer in mental health.   SOCIAL HISTORY:  This is a 45 year old single African-American female.  She  has two children, ages 97 and 46.  The 99 year old is with the mother of the  patient.  She is on disability.  She lives with her two children otherwise.  She reports no legal issues.   FAMILY HISTORY:  Parents with depression.   ALCOHOL/DRUG HISTORY:  Nonsmoker.  Denies any alcohol.  Denies any IV drug  use.  Reports recent use of cocaine.   PRIMARY CARE  PHYSICIAN:  Dr. Parke Simmers.   MEDICAL PROBLEMS:  Hypertension.   MEDICATIONS:  Has been on Xanax 1 mg q.i.d., Wellbutrin 150 mg for three  years, Diovan 160/25 mg, Seroquel 150 mg at bedtime.   ALLERGIES:  No known allergies.   PHYSICAL EXAMINATION:  The patient was assessed at [facility].  This is  middle-aged, overweight female in no acute distress.  Somewhat disheveled.  Temperature is 98.1, heart rate 92, blood pressure 134/94, weight 220  pounds, 99% saturated.   LABORATORY DATA:  CBC is within normal limits.  Normal EKG.  Glucose is 103.  SGOT 20, SGPT 25.  Urine drug screen is positive for cocaine.  Alcohol level  is less than 5.  Salicylate level less than 5.  Acetaminophen level less  than 10.  Wet prep was done that showed no yeast or no trichomonas.   MENTAL STATUS EXAM:  She is a fully alert, cooperative female.  Fair eye  contact.  Speech is clear, rambling.  The patient  feels angry.  Affect is  somewhat agitated.  Thought processes with patient seems to be exhibiting  some paranoid ideation, some ideas of reference.  Does not appear to be  internally stimulated.  Cognitive function intact.  Memory is fair.  Insight  is minimal.  She is a poor historian.   DIAGNOSES:   AXIS I:  1.  Depressive disorder not otherwise specified.  2.  Rule out bipolar disorder not otherwise specified.  3.  Cocaine abuse.   AXIS II:  Deferred.   AXIS III:  Hypertension.   AXIS IV:  Problems with primary support group, psychosocial problems related  to self-esteem, social isolation, chronic mental illness and substance  abuse.   AXIS V:  Current 38; estimated this past year 38.   PLAN:  Stabilize mood and thinking.  Contract for safety.  Work on relapse  prevention.  Will stabilize mood and thinking.  Will clarify patient's Xanax  prescription.  Will have Seroquel available for agitation.  The patient will  monitor her blood pressure.  The patient is to follow up with Dr. Mila Homer.  The   patient is to increase her coping skills.  Will consider a family session  with children.   TENTATIVE LENGTH OF STAY:  Four to five days.       JO/MEDQ  D:  01/30/2005  T:  01/30/2005  Job:  [id number]

## 2011-01-03 NOTE — Discharge Summary (Signed)
NAME:  Dorothy Alvarez, Dorothy Alvarez NO.:  [id number]   MEDICAL RECORD NO.:  [id number]          PATIENT TYPE:  INP   LOCATION:  9308                          FACILITY:  [facility]   PHYSICIAN:  Charles A. Clearance Coots, M.D.DATE OF BIRTH:  August 07, 1966   DATE OF ADMISSION:  06/25/2006  DATE OF DISCHARGE:  06/28/2006                               DISCHARGE SUMMARY   ADMISSION DIAGNOSIS:  Symptomatic uterine fibroids and menorrhagia.   DISCHARGE DIAGNOSIS:  Symptomatic uterine fibroids and menorrhagia.   PROCEDURE:  Status post total abdominal hysterectomy, bilateral salpingo-  oophorectomy.   DISPOSITION:  Discharged home in good condition.   REASON FOR ADMISSION:  A 45 year old black female with large uterine  fibroids and heavy painful periods.  The patient's fibroids have been  getting more uncomfortable, with increased pain or pressure over the  past 6 months due to large size.  The patient desired definitive  surgical management.   PAST SURGICAL HISTORY:  Knee.   PAST MEDICAL HISTORY:  Panic attacks.   ALLERGIES:  PENICILLIN.   MEDICATIONS:  Iron.   SOCIAL HISTORY:  Single.  Negative tobacco, alcohol, or recreational  drug use.   PHYSICAL EXAMINATION:  GENERAL:  Well-nourished, well-developed female  in no acute distress.  VITAL SIGNS:  Temperature 98.4, pulse 82, respiratory rate 18, blood  pressure 130/82.  HEENT:  Within normal limits.  NECK:  Supple, zero adenopathy.  LUNGS:  Clear to auscultation bilaterally.  HEART:  Regular rate and rhythm.  ABDOMEN:  Soft.  Uterus palpable above the symphysis, tender.  PELVIC:  Normal external female genitalia.  Vaginal mucosa normal.  Cervix parous without lesions.  Uterus about 16-week size, irregular,  tender.  There were no adnexal masses appreciated.  EXTREMITIES:  Without cyanosis, clubbing or edema.   ADMITTING LABORATORY VALUES:  Hemoglobin 11.1, hematocrit 32.3, white  blood cell count 7200, platelets 491,000.  Coags  were within normal  limits.  Urinalysis was within normal limits   HOSPITAL COURSE:  The patient underwent total abdominal hysterectomy and  bilateral salpingo-oophorectomy on June 25, 2006.  There were no  intraoperative complications.  Her postoperative course was  uncomplicated, and she was discharged home on postoperative day #3 in  good condition.   DISCHARGE LABORATORY VALUES:  Hemoglobin 9.5, hematocrit 28.6, white  blood cell count 12,500, platelets 419,000.   DISCHARGE MEDICATIONS:  Percocet and ibuprofen were prescribed for pain.  Continue iron therapy as prescribed.   DISCHARGE INSTRUCTIONS:  Routine written instructions were given for  discharge after hysterectomy.  The patient is to call our office for a  followup appointment in 2 weeks.      Charles A. Clearance Coots, M.D.  Electronically Signed     CAH/MEDQ  D:  07/17/2006  T:  07/19/2006  Job:  [id number]

## 2011-01-03 NOTE — Discharge Summary (Signed)
NAME:  Dorothy Alvarez, Dorothy Alvarez NO.:  [id number]   MEDICAL RECORD NO.:  [id number]          PATIENT TYPE:  IPS   LOCATION:  0307                          [facility]ILITY[facility]  [facility]   PHYSICIAN:  Jeanice Lim, M.D. DATE OF BIRTH:  April 25, 1966   DATE OF ADMISSION:  01/23/2005  DATE OF DISCHARGE:  01/30/2005                                 DISCHARGE SUMMARY   IDENTIFYING DATA:  This 45 year old single African-American female,  voluntarily admitted on June 8, presenting with a history of depression,  reporting just needed to get away.  Also experiencing some homicidal  ideation towards no one in particular. Reports stressors going way back to  1995, felt that should have been seen by mental health but had never been  started on medications.  Reported a history of cocaine use for 6 months to a  year.  First admission to [facility], sees Dr. Gwyndolyn Kaufman at mental  health.  Medical problems hypertension.   MEDICATIONS:  Xanax 1 mg q.i.d., Wellbutrin 150 mg for 3 years, Diovan  160/25 daily, and Seroquel 150 q.h.s.   ALLERGIES:  No known drug allergies.   PHYSICAL AND NEUROLOGICAL EXAMINATION:  Essentially within normal limits.   ROUTINE ADMISSION LABS:  Within normal limits.  Alcohol level less than 5.  Liver function tests within normal limits.  EKG within normal limits.   MENTAL STATUS EXAM:  Fully alert, cooperative, fair eye contact, speech  clear, rambling.  The patient feels angry, somewhat agitated.  Thought  process goal directed, exhibiting some paranoid ideation and ideas of  reference.  Does not appear to be internally distracted.  Cognitively  intact.  Judgment and insight were minimal and the patient was a poor  historian.   ADMISSION DIAGNOSES:  AXIS I:  Depressive disorder not otherwise specified,  rule out bipolar disorder not otherwise specified, cocaine dependency and  possible substance-induced mood disorder superimposed on a depressive  disorder.  AXIS  II:  Deferred.  AXIS III:  Hypertension  AXIS IV:  Moderate problems with primary support group, psychosocial issues,  self-esteem, isolation and chronic mental illness and substance abuse  sequelae.  AXIS V:  38/65.   HOSPITAL COURSE:  The patient was admitted and ordered routine p.r.n.  medications, underwent further monitoring, and was encouraged to participate  in individual, group and milieu therapy.  The patient's medications were  reconciled, clarification regarding Xanax.  Seroquel was ordered for  agitation.  The patient was placed on safety checks, encouraged to  participate in individual, group and milieu therapy, showed progress in  developing aftercare plan and relapse prevention plan, and the patient was  resumed on Wellbutrin and Xanax due to no history of previous benzodiazepine  abuse or alcohol abuse.  Seroquel was gradually tapered and Risperdal  started, and Equetro for mood instability, and the patient was continued on  Cymbalta targeting depressive symptoms and Topamax and trazodone for sleep  restoration.  The patient reported a positive response, tolerating  medications without side effects, showing remarkable improvement, and was  discharged in improved condition.  Mood was more  euthymic, affect brighter,  no dangerous ideation, no psychotic symptoms.  The patient reported  understanding the importance of being compliant with medication followup and  was given medication education again, and discharged on:  1.  Cymbalta 30 mg daily.  2.  Topamax 25 mg 2 at 8 p.m.  3.  Equetro 200 mg b.i.d.  4.  Xanax 1 mg 4 times a day as previously prescribed.  5.  Diovan 160 daily as previously prescribed.  6.  Hydrochlorothiazide 25 mg daily as previously prescribed.  7.  Allegra as previously prescribed.  8.  Risperdal 0.5 mg at 9 a.m. and 2 at 3 p.m.  9.  Wellbutrin 300 mg XL q.a.m.  10. Trazodone 100 mg 1.5 at 8 p.m.  11. Ambien 10 mg q.8 p.m.   DISPOSITION:  The  patient is to follow up with Dr. Lang Snow at [facility] on June 21 therapy 9:30 a.m.   DISCHARGE DIAGNOSES:  AXIS I:  Depressive disorder not otherwise specified,  rule out bipolar disorder not otherwise specified, cocaine dependency and  possible substance-induced mood disorder superimposed on a depressive  disorder.  AXIS II:  Deferred.  AXIS III:  Hypertension  AXIS IV:  Moderate problems with primary support group, psychosocial issues,  self esteem, isolation and chronic mental illness and substance abuse  sequelae.  AXIS V:  Global assessment of function on discharge was 55.       JEM/MEDQ  D:  03/06/2005  T:  03/06/2005  Job:  [id number]

## 2011-01-03 NOTE — Op Note (Signed)
NAME:  Dorothy Alvarez, Dorothy Alvarez NO.:  [id number]   MEDICAL RECORD NO.:  [id number]          PATIENT TYPE:  INP   LOCATION:  9308                          FACILITY:  [facility]   PHYSICIAN:  Charles A. Clearance Coots, M.D.DATE OF BIRTH:  1965/11/11   DATE OF PROCEDURE:  06/25/2006  DATE OF DISCHARGE:                                 OPERATIVE REPORT   PREOPERATIVE DIAGNOSIS:  Symptomatic uterine fibroids, menorrhagia.   POSTOPERATIVE DIAGNOSIS:  Symptomatic uterine fibroids, menorrhagia.   PROCEDURE:  Total abdominal hysterectomy, bilateral salpingectomy.   SURGEON:  Coral Ceo, MD   ASSISTANT:  Antionette Char, MD   ANESTHESIA:  General.   ESTIMATED BLOOD LOSS:  300 mL.   SPECIMEN TO PATHOLOGY:  Uterus, cervix, fallopian tubes.   COMPLICATIONS:  None.   Foley to gravity.   FINDINGS:  Large fibroid uterus.   OPERATION:  The patient was brought to operating room and after satisfactory  general endotracheal anesthesia, the abdomen was prepped and draped in the  usual sterile fashion.  The Pfannenstiel skin incision was made with a  scalpel that was deepened down to the fascia with a scalpel. The fascia was  nicked in the midline and the fascial incision was extended to left, to  right with curved Mayo scissors.  The superior and inferior fascial edges  were taken off the rectus muscle, both blunt and sharp dissection.  Rectus  muscles bluntly and sharply divided in midline, superiorly and inferiorly,  being careful to avoid the urinary bladder inferiorly.  Peritoneum was  entered digitally and was sharply extended superiorly and inferiorly being  careful to avoid the urinary bladder inferiorly.  The uterus was noted to be  about 18 weeks size and the uterus was brought up into the incision and  exteriorized.  The round ligaments were then grasped with Kelly forceps  bilaterally, cut and suture ligated with transfixion sutures of 0 Vicryl.  The anterior reflection of the  bladder peritoneum was then undermined and  excised with Metzenbaum scissors and the urinary bladder was sharply  dissected away from the lower uterine segment and the cervix, placing it  well out of the operative field.  A window was then created digitally.  Medial aspect of the broad ligament and parametrial clamps were placed  across the utero-ovarian and the broad ligaments and a second clamp was  placed beneath the upper clamp.  The utero-ovarian and broad ligaments were  then transected between clamps and free tie of 0 Vicryl was placed beneath  the bottom clamp followed by a transfixion suture of 0 Vicryl above the  knot.  Hemostasis was excellent.  The same procedure was performed on the  opposite side without complications.  The uterine vessels were then  skeletonized and were doubly clamped bilaterally with curved Heaney clamps  and straight Heaney clamp was placed above the upper clamp.  The vessels  were then transected and suture ligated beneath the lower clamp with 0  Vicryl.  A second transfixion suture was placed above the knot, beneath the  upper clamp of 0 Vicryl.  Hemostasis  was excellent.  Same procedure was  performed on the opposite side without complications.  The cardinal  ligaments were then serially clamped, cut and suture ligated with  transfixion sutures of 0 Vicryl down to the uterosacral ligaments.  The  uterosacral ligaments were then clamped, cut and suture ligated with  transfixion sutures of 0 Vicryl bilaterally.  Two parametrial clamps were  then placed across the vaginal cuff meeting in the center and the cervix and  uterus was cut away from the vaginal wall and submitted to pathology for  evaluation.  The vaginal cuff was then closed with transfixion sutures of 0-  0 Vicryl beneath each clamp and a figure-of-eight suture of 0 Vicryl in the  middle of the cuff.  Hemostasis was excellent.  The fallopian tubes were  then grasped with Babcock clamps and were  crossclamped with Kelly forceps  and excised with Metzenbaum scissors and suture ligated with transfixion  sutures of 0 Vicryl bilaterally.  The fallopian tubes were submitted to  pathology for evaluation.  Pelvic cavity was then thoroughly irrigated with  warm saline solution and all clots were removed.  All pedicles were examined  for hemostasis and there was no active bleeding noted.  Vaginal cuff was  examined for hemostasis and there was no active bleeding noted.  The  O'Connor-O'Sullivan retractor which had been placed in the incision was then  removed and all packing was removed.  Surgical technician indicated that all  needle, sponge and instrument counts were correct x2.  The abdomen was then  closed as follows.  Peritoneum was closed with a continuous suture of 2-0  Vicryl.  Fascia was closed with a continuous suture of 0 Vicryl from each  corner to the center.  Subcutaneous tissue was thoroughly irrigated with  warm saline solution and all areas of subcutaneous bleeding were coagulated  with Bovie.  The skin was then closed with continuous subcuticular suture of  3-0 Monocryl.  Dermabond was then placed along the incision closure.  The  patient tolerated procedure well, was transported to recovery room in  satisfactory condition.      Charles A. Clearance Coots, M.D.  Electronically Signed     CAH/MEDQ  D:  06/25/2006  T:  06/26/2006  Job:  [id number]

## 2011-05-14 LAB — DIFFERENTIAL
Basophils Absolute: 0
Basophils Relative: 1
Eosinophils Relative: 4
Lymphocytes Relative: 40
Monocytes Absolute: 0.6

## 2011-05-14 LAB — CBC
HCT: 40.8
Hemoglobin: 14.2
MCHC: 34.8
MCV: 91.7
RDW: 13.2

## 2011-05-14 LAB — POCT I-STAT, CHEM 8
BUN: 9
Calcium, Ion: 1.08 — ABNORMAL LOW
TCO2: 26

## 2011-05-14 LAB — URINALYSIS, ROUTINE W REFLEX MICROSCOPIC
Bilirubin Urine: NEGATIVE
Hgb urine dipstick: NEGATIVE
Ketones, ur: NEGATIVE
Protein, ur: NEGATIVE
Urobilinogen, UA: 0.2

## 2011-05-16 LAB — URINALYSIS, ROUTINE W REFLEX MICROSCOPIC
Hgb urine dipstick: NEGATIVE
Nitrite: NEGATIVE
Specific Gravity, Urine: 1.025
Urobilinogen, UA: 1

## 2011-05-16 LAB — WET PREP, GENITAL
Trich, Wet Prep: NONE SEEN
Yeast Wet Prep HPF POC: NONE SEEN

## 2011-05-16 LAB — RPR: RPR Ser Ql: NONREACTIVE

## 2011-05-16 LAB — GC/CHLAMYDIA PROBE AMP, GENITAL: Chlamydia, DNA Probe: NEGATIVE

## 2011-05-20 LAB — CBC
HCT: 34.6 — ABNORMAL LOW
MCV: 94.2
Platelets: 268
RDW: 13.3

## 2011-05-20 LAB — POCT I-STAT, CHEM 8
BUN: 13
Calcium, Ion: 1.21
Chloride: 98
Creatinine, Ser: 1.2
Glucose, Bld: 84
HCT: 38
Hemoglobin: 12.9
Potassium: 2.9 — ABNORMAL LOW
Sodium: 138
TCO2: 31

## 2011-05-20 LAB — DIFFERENTIAL
Basophils Absolute: 0
Eosinophils Absolute: 0.6
Eosinophils Relative: 5
Monocytes Absolute: 0.7

## 2011-05-20 LAB — URINALYSIS, ROUTINE W REFLEX MICROSCOPIC
Bilirubin Urine: NEGATIVE
Glucose, UA: NEGATIVE
Hgb urine dipstick: NEGATIVE
Ketones, ur: NEGATIVE
Protein, ur: NEGATIVE

## 2011-05-26 LAB — CULTURE, ROUTINE-ABSCESS

## 2012-01-16 ENCOUNTER — Encounter (HOSPITAL_COMMUNITY): Payer: Self-pay | Admitting: Emergency Medicine

## 2012-01-16 ENCOUNTER — Emergency Department (HOSPITAL_COMMUNITY): Payer: Medicare Other

## 2012-01-16 ENCOUNTER — Emergency Department (HOSPITAL_COMMUNITY)
Admission: EM | Admit: 2012-01-16 | Discharge: 2012-01-17 | Disposition: A | Discharge status: Home / Self Care | Payer: Medicare Other | Attending: Emergency Medicine | Admitting: Emergency Medicine

## 2012-01-16 DIAGNOSIS — Z90710 Acquired absence of both cervix and uterus: Secondary | ICD-10-CM | POA: Insufficient documentation

## 2012-01-16 DIAGNOSIS — M549 Dorsalgia, unspecified: Secondary | ICD-10-CM | POA: Insufficient documentation

## 2012-01-16 DIAGNOSIS — L905 Scar conditions and fibrosis of skin: Secondary | ICD-10-CM

## 2012-01-16 DIAGNOSIS — I10 Essential (primary) hypertension: Secondary | ICD-10-CM | POA: Insufficient documentation

## 2012-01-16 DIAGNOSIS — F329 Major depressive disorder, single episode, unspecified: Secondary | ICD-10-CM | POA: Insufficient documentation

## 2012-01-16 DIAGNOSIS — F3289 Other specified depressive episodes: Secondary | ICD-10-CM | POA: Insufficient documentation

## 2012-01-16 DIAGNOSIS — R300 Dysuria: Secondary | ICD-10-CM | POA: Insufficient documentation

## 2012-01-16 DIAGNOSIS — F411 Generalized anxiety disorder: Secondary | ICD-10-CM | POA: Insufficient documentation

## 2012-01-16 DIAGNOSIS — R109 Unspecified abdominal pain: Secondary | ICD-10-CM | POA: Insufficient documentation

## 2012-01-16 HISTORY — DX: Depression, unspecified: F32.A

## 2012-01-16 HISTORY — DX: Essential (primary) hypertension: I10

## 2012-01-16 HISTORY — DX: Major depressive disorder, single episode, unspecified: F32.9

## 2012-01-16 HISTORY — DX: Anxiety disorder, unspecified: F41.9

## 2012-01-16 LAB — BASIC METABOLIC PANEL
BUN: 19 mg/dL (ref 6–23)
Creatinine, Ser: 0.88 mg/dL (ref 0.50–1.10)
GFR calc Af Amer: 89 mL/min — ABNORMAL LOW (ref >90)
GFR calc non Af Amer: 77 mL/min — ABNORMAL LOW (ref >90)
Potassium: 4.2 mEq/L (ref 3.5–5.1)

## 2012-01-16 LAB — URINALYSIS, ROUTINE W REFLEX MICROSCOPIC
Bilirubin Urine: NEGATIVE
Glucose, UA: NEGATIVE mg/dL
Hgb urine dipstick: NEGATIVE
Ketones, ur: NEGATIVE mg/dL
Leukocytes, UA: NEGATIVE
Nitrite: NEGATIVE
Protein, ur: NEGATIVE mg/dL
Specific Gravity, Urine: 1.023 (ref 1.005–1.030)
Urobilinogen, UA: 0.2 mg/dL (ref 0.0–1.0)
pH: 5.5 (ref 5.0–8.0)

## 2012-01-16 LAB — CBC
Hemoglobin: 12.2 g/dL (ref 12.0–15.0)
MCH: 31.3 pg (ref 26.0–34.0)
MCHC: 33.8 g/dL (ref 30.0–36.0)
RDW: 12.8 % (ref 11.5–15.5)

## 2012-01-16 LAB — DIFFERENTIAL
Basophils Absolute: 0.1 10*3/uL (ref 0.0–0.1)
Basophils Relative: 1 % (ref 0–1)
Eosinophils Absolute: 1.2 10*3/uL — ABNORMAL HIGH (ref 0.0–0.7)
Monocytes Relative: 8 % (ref 3–12)
Neutrophils Relative %: 52 % (ref 43–77)

## 2012-01-16 MED ORDER — OXYCODONE-ACETAMINOPHEN 5-325 MG PO TABS
1.0000 | ORAL_TABLET | Freq: Once | ORAL | Status: AC
  Administered 2012-01-16: 1 via ORAL
  Filled 2012-01-16: qty 1

## 2012-01-16 MED ORDER — OXYCODONE-ACETAMINOPHEN 5-325 MG PO TABS: 1.0000 | ORAL_TABLET | Freq: Four times a day (QID) | ORAL | Status: AC | PRN

## 2012-01-16 NOTE — ED Provider Notes (Addendum)
History     CSN: [id number]  Arrival date & time 01/16/12  4098   First MD Initiated Contact with Patient 01/16/12 2044      Chief Complaint  Patient presents with  . Abdominal Pain  . Back Pain  . Dysuria    (Consider location/radiation/quality/duration/timing/severity/associated sxs/prior treatment) Patient is a 46 y.o. female presenting with abdominal pain, back pain, and dysuria. The history is provided by the patient.  Abdominal Pain The primary symptoms of the illness include abdominal pain and dysuria. The primary symptoms of the illness do not include fever, nausea, vomiting or diarrhea. Episode onset:   states  thhis hass bbeen  going on forr years but worse in the last week. The onset of the illness was gradual. The problem has not changed since onset. The abdominal pain is located in the suprapubic region. The abdominal pain radiates to the right flank. The severity of the abdominal pain is 8/10. The abdominal pain is relieved by nothing. The abdominal pain is exacerbated by movement.  Onset: Strong smelling urine. The dysuria is not associated with discharge, frequency or urgency.  Additional symptoms associated with the illness include back pain. Symptoms associated with the illness do not include anorexia, urgency or frequency.  Back Pain  Associated symptoms include abdominal pain and dysuria. Pertinent negatives include no fever.  Dysuria  Pertinent negatives include no nausea, no vomiting, no discharge, no frequency and no urgency.    Past Medical History  Diagnosis Date  . Hypertension   . Depression   . Anxiety     Past Surgical History  Procedure Date  . Abdominal hysterectomy     No family history on file.  History  Substance Use Topics  . Smoking status: Never Smoker   . Smokeless tobacco: Not on file  . Alcohol Use: No    OB History    Grav Para Term Preterm Abortions TAB SAB Ect Mult Living                  Review of Systems    Constitutional: Negative for fever.  Gastrointestinal: Positive for abdominal pain. Negative for nausea, vomiting, diarrhea and anorexia.  Genitourinary: Positive for dysuria. Negative for urgency and frequency.  Musculoskeletal: Positive for back pain.  All other systems reviewed and are negative.    Allergies  Penicillins  Home Medications   Current Outpatient Rx  Name Route Sig Dispense Refill  . ACETAMINOPHEN 500 MG PO TABS Oral Take 500 mg by mouth every 6 (six) hours as needed.    . ALPRAZOLAM 0.25 MG PO TABS Oral Take 0.25 mg by mouth 3 (three) times daily as needed. anxiety    . TRIAMTERENE-HCTZ 37.5-25 MG PO CAPS Oral Take 1 capsule by mouth daily.      BP 115/90  Pulse 111  Temp(Src) 98.1 F (36.7 C) (Oral)  Resp 20  Wt 184 lb (83.462 kg)  SpO2 98%  Physical Exam  Nursing note and vitals reviewed. Constitutional: She is oriented to person, place, and time. She appears well-developed and well-nourished. No distress.  HENT:  Head: Normocephalic and atraumatic.  Mouth/Throat: Oropharynx is clear and moist.  Eyes: Conjunctivae and EOM are normal. Pupils are equal, round, and reactive to light.  Neck: Normal range of motion. Neck supple.  Cardiovascular: Normal rate, regular rhythm and intact distal pulses.   No murmur heard. Pulmonary/Chest: Effort normal and breath sounds normal. No respiratory distress. She has no wheezes. She has no rales.  Abdominal: Soft.  She exhibits no distension. There is tenderness in the suprapubic area. There is no rebound, no guarding and no CVA tenderness.  Musculoskeletal: Normal range of motion. She exhibits no edema and no tenderness.       Lumbar back: She exhibits tenderness. She exhibits no bony tenderness.       Back:  Neurological: She is alert and oriented to person, place, and time.  Skin: Skin is warm and dry. No rash noted. No erythema.  Psychiatric: She has a normal mood and affect. Her behavior is normal.    ED  Course  Procedures (including critical care time)  Labs Reviewed  DIFFERENTIAL - Abnormal; Notable for the following:    Eosinophils Relative 12 (*)    Eosinophils Absolute 1.2 (*)    All other components within normal limits  BASIC METABOLIC PANEL - Abnormal; Notable for the following:    Glucose, Bld 102 (*)    GFR calc non Af Amer 77 (*)    GFR calc Af Amer 89 (*)    All other components within normal limits  URINALYSIS, ROUTINE W REFLEX MICROSCOPIC  CBC   No results found.   No diagnosis found.    MDM   Patient here complaining of lower abdominal pain and right-sided back pain. She states this has been ongoing for years after a history of fibroids and an abdominal hysterectomy. She also states her urine has been smelling strong but denies fever, nausea or vomiting. Exam patient's pain is exacerbated by movement and palpation.  However she has no peritoneal signs or concern for pancreatitis, gastritis, appendicitis or diverticulitis. CBC, BMP, UA are all within normal limits. Discussed this with the patient and she will be following up with her PMD on the 15th for further evaluation. She was requesting a Pap smear here but explained to the patient that we are unable to do that test. She also was requesting a pelvic ultrasound however the patient no longer has a uterus and feel her pain is most likely from scar tissue and this would not be adequately evaluated with an ultrasound or CT.   10:47 PM Looking back through the records patient had a complex questionable left ovarian cyst one year ago and she never followed up. Based on this information will get an ultrasound to further evaluate the ovary.  Gwyneth Sprout, MD 01/16/12 1308  Gwyneth Sprout, MD 01/17/12 2256

## 2012-01-16 NOTE — ED Notes (Signed)
Ultrasound Tech at bedside at this time

## 2012-01-16 NOTE — Discharge Instructions (Signed)

## 2012-01-16 NOTE — ED Notes (Signed)
Pt alert, nad, c/o UTI s/s,. Onset several weeks ago, resp even unlabored, skin pwd. Pt states hx of uterine fibroids, denies bleeding or discharge

## 2012-01-17 MED ORDER — OXYCODONE-ACETAMINOPHEN 5-325 MG PO TABS
1.0000 | ORAL_TABLET | Freq: Once | ORAL | Status: AC
  Administered 2012-01-17: 1 via ORAL
  Filled 2012-01-17: qty 1

## 2012-01-17 MED ORDER — KETOROLAC TROMETHAMINE 30 MG/ML IJ SOLN
30.0000 mg | Freq: Once | INTRAMUSCULAR | Status: AC
  Administered 2012-01-17: 30 mg via INTRAMUSCULAR
  Filled 2012-01-17: qty 1

## 2012-01-17 NOTE — ED Notes (Signed)
Spoke with Earley Favor, NP and notified her that this patient has already received 2 percocet during her stay in the ED. Confirmed that she has ordered a 3rd percocet and she does want this administered. The patient's daughter is driving her home

## 2012-02-05 ENCOUNTER — Encounter (HOSPITAL_COMMUNITY): Payer: Self-pay | Admitting: Emergency Medicine

## 2012-02-05 ENCOUNTER — Emergency Department (HOSPITAL_COMMUNITY)
Admission: EM | Admit: 2012-02-05 | Discharge: 2012-02-05 | Disposition: A | Discharge status: Home / Self Care | Payer: Medicare Other | Attending: Emergency Medicine | Admitting: Emergency Medicine

## 2012-02-05 ENCOUNTER — Emergency Department (HOSPITAL_COMMUNITY): Payer: Medicare Other

## 2012-02-05 DIAGNOSIS — R609 Edema, unspecified: Secondary | ICD-10-CM | POA: Insufficient documentation

## 2012-02-05 DIAGNOSIS — F329 Major depressive disorder, single episode, unspecified: Secondary | ICD-10-CM | POA: Insufficient documentation

## 2012-02-05 DIAGNOSIS — R0609 Other forms of dyspnea: Secondary | ICD-10-CM | POA: Insufficient documentation

## 2012-02-05 DIAGNOSIS — F3289 Other specified depressive episodes: Secondary | ICD-10-CM | POA: Insufficient documentation

## 2012-02-05 DIAGNOSIS — I10 Essential (primary) hypertension: Secondary | ICD-10-CM | POA: Insufficient documentation

## 2012-02-05 DIAGNOSIS — R0600 Dyspnea, unspecified: Secondary | ICD-10-CM

## 2012-02-05 DIAGNOSIS — Z880 Allergy status to penicillin: Secondary | ICD-10-CM | POA: Insufficient documentation

## 2012-02-05 DIAGNOSIS — F411 Generalized anxiety disorder: Secondary | ICD-10-CM | POA: Insufficient documentation

## 2012-02-05 DIAGNOSIS — M545 Low back pain, unspecified: Secondary | ICD-10-CM | POA: Insufficient documentation

## 2012-02-05 DIAGNOSIS — R0989 Other specified symptoms and signs involving the circulatory and respiratory systems: Secondary | ICD-10-CM | POA: Insufficient documentation

## 2012-02-05 DIAGNOSIS — G8929 Other chronic pain: Secondary | ICD-10-CM | POA: Insufficient documentation

## 2012-02-05 LAB — BASIC METABOLIC PANEL
BUN: 16 mg/dL (ref 6–23)
Calcium: 9.5 mg/dL (ref 8.4–10.5)
Creatinine, Ser: 0.84 mg/dL (ref 0.50–1.10)
GFR calc Af Amer: >90 mL/min (ref >90)
GFR calc non Af Amer: 81 mL/min — ABNORMAL LOW (ref >90)

## 2012-02-05 LAB — CBC
MCHC: 34.9 g/dL (ref 30.0–36.0)
RDW: 13 % (ref 11.5–15.5)

## 2012-02-05 LAB — DIFFERENTIAL
Basophils Absolute: 0 10*3/uL (ref 0.0–0.1)
Basophils Relative: 0 % (ref 0–1)
Monocytes Absolute: 0.7 10*3/uL (ref 0.1–1.0)
Neutro Abs: 6.7 10*3/uL (ref 1.7–7.7)
Neutrophils Relative %: 60 % (ref 43–77)

## 2012-02-05 LAB — URINALYSIS, ROUTINE W REFLEX MICROSCOPIC
Bilirubin Urine: NEGATIVE
Ketones, ur: NEGATIVE mg/dL
Nitrite: NEGATIVE
Specific Gravity, Urine: 1.027 (ref 1.005–1.030)
Urobilinogen, UA: 0.2 mg/dL (ref 0.0–1.0)

## 2012-02-05 MED ORDER — METHOCARBAMOL 500 MG PO TABS: 500.0000 mg | ORAL_TABLET | Freq: Two times a day (BID) | ORAL | Status: AC

## 2012-02-05 MED ORDER — KETOROLAC TROMETHAMINE 60 MG/2ML IM SOLN
60.0000 mg | Freq: Once | INTRAMUSCULAR | Status: AC
  Administered 2012-02-05: 60 mg via INTRAMUSCULAR
  Filled 2012-02-05: qty 2

## 2012-02-05 NOTE — ED Notes (Signed)
Patient complains of constipation- last bowel movement "I can't remember".

## 2012-02-05 NOTE — ED Notes (Signed)
Pt c/o lower abd pain x years after having hysterectomy; pt c/o increased SOB and swelling; pt speaking complete sentences and mild swelling noted to feet; pt sts x 2 weeks; pt sts some burning with urination but denies vaginal discharge

## 2012-02-05 NOTE — ED Provider Notes (Signed)
History     CSN: [id number]  Arrival date & time 02/05/12  1016   First MD Initiated Contact with Patient 02/05/12 1114      Chief Complaint  Patient presents with  . Abdominal Pain  . Shortness of Breath    (Consider location/radiation/quality/duration/timing/severity/associated sxs/prior treatment) HPI Pt with multiple complaints. She has chronic lower abdominal pain, related to prior hysterectomy, no change in character or severity but no longer controlled well with her home Vicoprofen. She also reports moderate aching lower back pain, worse with movement. She feels like she is swollen and 'retaining fluid' and reports some subjective dyspnea, no orthopnea or dyspnea with exertion. She has not had cough or fever. No chest pain.   Past Medical History  Diagnosis Date  . Hypertension   . Depression   . Anxiety     Past Surgical History  Procedure Date  . Abdominal hysterectomy     History reviewed. No pertinent family history.  History  Substance Use Topics  . Smoking status: Never Smoker   . Smokeless tobacco: Not on file  . Alcohol Use: No    OB History    Grav Para Term Preterm Abortions TAB SAB Ect Mult Living                  Review of Systems All other systems reviewed and are negative except as noted in HPI.   Allergies  Penicillins  Home Medications   Current Outpatient Rx  Name Route Sig Dispense Refill  . ALPRAZOLAM 0.5 MG PO TABS Oral Take 0.5 mg by mouth 3 (three) times daily as needed. For anxiety.    . AMITRIPTYLINE HCL 50 MG PO TABS Oral Take 50 mg by mouth 2 (two) times daily.    Marland Kitchen LOSARTAN POTASSIUM 100 MG PO TABS Oral Take 100 mg by mouth daily.    . TRIAMTERENE-HCTZ 37.5-25 MG PO CAPS Oral Take 1 capsule by mouth daily.    Marland Kitchen ZOLPIDEM TARTRATE ER 12.5 MG PO TBCR Oral Take 12.5 mg by mouth at bedtime as needed. For insomnia.      BP 142/100  Pulse 108  Temp 98.4 F (36.9 C) (Oral)  Resp 20  SpO2 97%  Physical Exam  Nursing note  and vitals reviewed. Constitutional: She is oriented to person, place, and time. She appears well-developed and well-nourished.  HENT:  Head: Normocephalic and atraumatic.  Eyes: EOM are normal. Pupils are equal, round, and reactive to light.  Neck: Normal range of motion. Neck supple.  Cardiovascular: Normal rate, normal heart sounds and intact distal pulses.   Pulmonary/Chest: Effort normal and breath sounds normal.  Abdominal: Bowel sounds are normal. She exhibits no distension. There is no tenderness.  Musculoskeletal: Normal range of motion. She exhibits tenderness (bilateral lumbar paraspinal tenderness).       Pt has very trace edema of LE bilaterally, no pitting  Neurological: She is alert and oriented to person, place, and time. She has normal strength. No cranial nerve deficit or sensory deficit.  Skin: Skin is warm and dry. No rash noted.  Psychiatric: She has a normal mood and affect.    ED Course  Procedures (including critical care time)  Labs Reviewed  URINALYSIS, ROUTINE W REFLEX MICROSCOPIC - Abnormal; Notable for the following:    APPearance HAZY (*)     All other components within normal limits  BASIC METABOLIC PANEL - Abnormal; Notable for the following:    GFR calc non Af Amer 81 (*)  All other components within normal limits  CBC - Abnormal; Notable for the following:    WBC 11.2 (*)     RBC 3.86 (*)     HCT 34.7 (*)     All other components within normal limits  DIFFERENTIAL - Abnormal; Notable for the following:    Eosinophils Relative 7 (*)     Eosinophils Absolute 0.8 (*)     All other components within normal limits   Dg Chest 2 View  02/05/2012  *RADIOLOGY REPORT*  Clinical Data: Shortness of breath, chest pain.  CHEST - 2 VIEW  Comparison: 07/05/2009  Findings: Heart and mediastinal contours are within normal limits. No focal opacities or effusions.  No acute bony abnormality.  IMPRESSION: No active cardiopulmonary disease.  Original Report  Authenticated By: Cyndie Chime, M.D.     No diagnosis found.    MDM  Pt has multiple complaints, most of which appear to be chronic or previously evaluated, see similar presentation from 3 weeks ago. She has no evidence of fluid overload, CHF, peripheral edema, etc. Advised to continue pain medication. Robaxin as a muscle relaxer. PCP followup.         Cyrilla Durkin B. Bernette Mayers, MD 02/05/12 1254

## 2012-02-05 NOTE — Discharge Instructions (Signed)
Back Pain, Adult Low back pain is very common. About 1 in 5 people have back pain.The cause of low back pain is rarely dangerous. The pain often gets better over time.About half of people with a sudden onset of back pain feel better in just 2 weeks. About 8 in 10 people feel better by 6 weeks.  CAUSES Some common causes of back pain include:  Strain of the muscles or ligaments supporting the spine.   Wear and tear (degeneration) of the spinal discs.   Arthritis.   Direct injury to the back.  DIAGNOSIS Most of the time, the direct cause of low back pain is not known.However, back pain can be treated effectively even when the exact cause of the pain is unknown.Answering your caregiver's questions about your overall health and symptoms is one of the most accurate ways to make sure the cause of your pain is not dangerous. If your caregiver needs more information, he or she may order lab work or imaging tests (X-rays or MRIs).However, even if imaging tests show changes in your back, this usually does not require surgery. HOME CARE INSTRUCTIONS For many people, back pain returns.Since low back pain is rarely dangerous, it is often a condition that people can learn to manageon their own.   Remain active. It is stressful on the back to sit or stand in one place. Do not sit, drive, or stand in one place for more than 30 minutes at a time. Take short walks on level surfaces as soon as pain allows.Try to increase the length of time you walk each day.   Do not stay in bed.Resting more than 1 or 2 days can delay your recovery.   Do not avoid exercise or work.Your body is made to move.It is not dangerous to be active, even though your back may hurt.Your back will likely heal faster if you return to being active before your pain is gone.   Pay attention to your body when you bend and lift. Many people have less discomfortwhen lifting if they bend their knees, keep the load close to their  bodies,and avoid twisting. Often, the most comfortable positions are those that put less stress on your recovering back.   Find a comfortable position to sleep. Use a firm mattress and lie on your side with your knees slightly bent. If you lie on your back, put a pillow under your knees.   Only take over-the-counter or prescription medicines as directed by your caregiver. Over-the-counter medicines to reduce pain and inflammation are often the most helpful.Your caregiver may prescribe muscle relaxant drugs.These medicines help dull your pain so you can more quickly return to your normal activities and healthy exercise.   Put ice on the injured area.   Put ice in a plastic bag.   Place a towel between your skin and the bag.   Leave the ice on for 15 to 20 minutes, 3 to 4 times a day for the first 2 to 3 days. After that, ice and heat may be alternated to reduce pain and spasms.   Ask your caregiver about trying back exercises and gentle massage. This may be of some benefit.   Avoid feeling anxious or stressed.Stress increases muscle tension and can worsen back pain.It is important to recognize when you are anxious or stressed and learn ways to manage it.Exercise is a great option.  SEEK MEDICAL CARE IF:  You have pain that is not relieved with rest or medicine.   You have   pain that does not improve in 1 week.   You have new symptoms.   You are generally not feeling well.  SEEK IMMEDIATE MEDICAL CARE IF:   You have pain that radiates from your back into your legs.   You develop new bowel or bladder control problems.   You have unusual weakness or numbness in your arms or legs.   You develop nausea or vomiting.   You develop abdominal pain.   You feel faint.  Document Released: 08/04/2005 Document Revised: 07/24/2011 Document Reviewed: 12/23/2010 [facility] Patient Information 2012 [place], [place].Shortness of Breath Shortness of breath (dyspnea) is the feeling of uneasy  breathing. Shortness of breath does not always mean that there is a life-threatening illness. However, shortness of breath requires immediate medical care. CAUSES  Causes for shortness of breath include:  Not enough oxygen in the air (as with high altitudes or with a smoke-filled room).   Short-term (acute) lung disease, including:   Infections such as pneumonia.   Fluid in the lungs, such as heart failure.   A blood clot in the lungs (pulmonary embolism).   Lasting (chronic) lung diseases.   Heart disease (heart attack, angina, heart failure, and others).   Low red blood cells (anemia).   Poor physical fitness. This can cause shortness of breath when you exercise.   Chest or back injuries or stiffness.   Being overweight (obese).   Anxiety. This can make you feel like you are not getting enough air.  DIAGNOSIS  Serious medical problems can usually be found during your physical exam. Many tests may also be done to determine why you are having shortness of breath. Tests include:  Chest X-rays.   Lung function tests.   Blood tests.   Electrocardiography.   Exercise testing.   A cardiac echo.   Imaging scans.  Your caregiver may not be able to find a cause for your shortness of breath after your exam. In this case, it is important to have a follow-up exam with your caregiver as directed.  HOME CARE INSTRUCTIONS   Do not smoke. Smoking is a common cause of shortness of breath. Ask for help to stop smoking.   Avoid being around chemicals that may bother your breathing (paint fumes, dust).   Rest as needed. Slowly resume your usual activities.   If medicines were prescribed, take them as directed for the full length of time directed. This includes oxygen and any inhaled medicines.   Follow up with your caregiver as directed. Waiting to do so or failure to follow up could result in worsening of your condition and possible disability or death.   Be sure you understand  what to do or who to call if your shortness of breath worsens.  SEEK MEDICAL CARE IF:   Your condition does not improve in the time expected.   You have a hard time doing your normal activities even with rest.   You have any side effects or problems with the medicines prescribed.   You develop any new symptoms.  SEEK IMMEDIATE MEDICAL CARE IF:   Your shortness of breath is getting worse.   You feel lightheaded, faint, or develop a cough not controlled with medicines.   You start coughing up blood.   You have pain with breathing.   You have chest pain or pain in your arms, shoulders, or abdomen.   You have a fever.   You are unable to walk up stairs or exercise the way you normally do.  Your symptoms are getting worse.  Document Released: 04/29/2001 Document Revised: 07/24/2011 Document Reviewed: 12/15/2007 [facility] Patient Information 2012 [place], [place].

## 2012-04-03 ENCOUNTER — Encounter (HOSPITAL_COMMUNITY): Payer: Self-pay | Admitting: Emergency Medicine

## 2012-04-03 ENCOUNTER — Emergency Department (HOSPITAL_COMMUNITY)
Admission: EM | Admit: 2012-04-03 | Discharge: 2012-04-04 | Disposition: A | Discharge status: Home / Self Care | Payer: Medicare Other | Attending: Emergency Medicine | Admitting: Emergency Medicine

## 2012-04-03 DIAGNOSIS — Z90710 Acquired absence of both cervix and uterus: Secondary | ICD-10-CM | POA: Insufficient documentation

## 2012-04-03 DIAGNOSIS — M549 Dorsalgia, unspecified: Secondary | ICD-10-CM | POA: Insufficient documentation

## 2012-04-03 DIAGNOSIS — K5900 Constipation, unspecified: Secondary | ICD-10-CM | POA: Insufficient documentation

## 2012-04-03 DIAGNOSIS — N949 Unspecified condition associated with female genital organs and menstrual cycle: Secondary | ICD-10-CM | POA: Insufficient documentation

## 2012-04-03 DIAGNOSIS — R102 Pelvic and perineal pain: Secondary | ICD-10-CM

## 2012-04-03 DIAGNOSIS — I10 Essential (primary) hypertension: Secondary | ICD-10-CM | POA: Insufficient documentation

## 2012-04-03 LAB — CBC WITH DIFFERENTIAL/PLATELET
Basophils Absolute: 0.1 10*3/uL (ref 0.0–0.1)
Basophils Relative: 1 % (ref 0–1)
Eosinophils Absolute: 0.6 10*3/uL (ref 0.0–0.7)
Lymphs Abs: 3.7 10*3/uL (ref 0.7–4.0)
MCH: 31.6 pg (ref 26.0–34.0)
Neutrophils Relative %: 42 % — ABNORMAL LOW (ref 43–77)
Platelets: 311 10*3/uL (ref 150–400)
RBC: 4.34 MIL/uL (ref 3.87–5.11)
RDW: 12.7 % (ref 11.5–15.5)

## 2012-04-03 LAB — BASIC METABOLIC PANEL
GFR calc Af Amer: 89 mL/min — ABNORMAL LOW (ref >90)
GFR calc non Af Amer: 77 mL/min — ABNORMAL LOW (ref >90)
Glucose, Bld: 84 mg/dL (ref 70–99)
Potassium: 3.3 mEq/L — ABNORMAL LOW (ref 3.5–5.1)
Sodium: 137 mEq/L (ref 135–145)

## 2012-04-03 LAB — URINALYSIS, ROUTINE W REFLEX MICROSCOPIC
Bilirubin Urine: NEGATIVE
Nitrite: NEGATIVE
Specific Gravity, Urine: 1.021 (ref 1.005–1.030)
Urobilinogen, UA: 0.2 mg/dL (ref 0.0–1.0)

## 2012-04-03 LAB — WET PREP, GENITAL
WBC, Wet Prep HPF POC: NONE SEEN
Yeast Wet Prep HPF POC: NONE SEEN

## 2012-04-03 MED ORDER — OXYCODONE-ACETAMINOPHEN 5-325 MG PO TABS
2.0000 | ORAL_TABLET | Freq: Once | ORAL | Status: AC
  Administered 2012-04-03: 2 via ORAL
  Filled 2012-04-03: qty 2

## 2012-04-03 MED ORDER — ONDANSETRON 8 MG PO TBDP
8.0000 mg | ORAL_TABLET | Freq: Once | ORAL | Status: AC
  Administered 2012-04-03: 8 mg via ORAL
  Filled 2012-04-03: qty 1

## 2012-04-03 MED ORDER — HYDROCODONE-ACETAMINOPHEN 5-325 MG PO TABS: 1.0000 | ORAL_TABLET | ORAL | Status: AC | PRN

## 2012-04-03 MED ORDER — POTASSIUM CHLORIDE CRYS ER 20 MEQ PO TBCR
40.0000 meq | EXTENDED_RELEASE_TABLET | Freq: Once | ORAL | Status: AC
  Administered 2012-04-03: 40 meq via ORAL
  Filled 2012-04-03: qty 2

## 2012-04-03 MED ORDER — KETOROLAC TROMETHAMINE 60 MG/2ML IM SOLN
60.0000 mg | Freq: Once | INTRAMUSCULAR | Status: AC
  Administered 2012-04-03: 60 mg via INTRAMUSCULAR
  Filled 2012-04-03: qty 2

## 2012-04-03 NOTE — ED Provider Notes (Signed)
History     CSN: [id number]  Arrival date & time 04/03/12  1737   First MD Initiated Contact with Patient 04/03/12 2016      Chief Complaint  Patient presents with  . Abdominal Pain  . Back Pain   HPI  History provided by the patient. Patient is a 46 year old female with history of hypertension, anxiety and depression who presents with complaints of intermittent waxing and waning lower abdominal cramping and discomfort. Symptoms have been associated with some dysuria and urinary frequency. Pain occasionally radiates to bilateral flank area. Patient also notes a foul odor to her urine was a dark coloration. She denies any blood. Patient denies any fever, chills, sweats, nausea or vomiting. Patient also reports some issues with hard stool and constipation. Her last bowel movement was yesterday after taking prune juice to help. Patient has history of hysterectomy and fibroids in the past. She denies any vaginal discharge or bleeding. She does report some vaginal itching and burning.    Past Medical History  Diagnosis Date  . Hypertension   . Depression   . Anxiety     Past Surgical History  Procedure Date  . Abdominal hysterectomy     No family history on file.  History  Substance Use Topics  . Smoking status: Never Smoker   . Smokeless tobacco: Not on file  . Alcohol Use: No    OB History    Grav Para Term Preterm Abortions TAB SAB Ect Mult Living                  Review of Systems  Constitutional: Negative for fever and chills.  Cardiovascular: Negative for chest pain.  Gastrointestinal: Positive for nausea, abdominal pain and constipation. Negative for vomiting and diarrhea.  Genitourinary: Positive for dysuria, frequency and flank pain. Negative for hematuria, vaginal bleeding and vaginal discharge.    Allergies  Penicillins  Home Medications   Current Outpatient Rx  Name Route Sig Dispense Refill  . ALPRAZOLAM 0.5 MG PO TABS Oral Take 0.5 mg by mouth 3  (three) times daily as needed. For anxiety.    . TRIAMTERENE-HCTZ 37.5-25 MG PO CAPS Oral Take 1 capsule by mouth daily.    Marland Kitchen ZOLPIDEM TARTRATE ER 12.5 MG PO TBCR Oral Take 12.5 mg by mouth at bedtime as needed. For insomnia.      BP 150/97  Pulse 84  Temp 97.9 F (36.6 C) (Oral)  Resp 18  SpO2 100%  Physical Exam  Nursing note and vitals reviewed. Constitutional: She is oriented to person, place, and time. She appears well-developed and well-nourished. No distress.  HENT:  Head: Normocephalic.  Cardiovascular: Normal rate and regular rhythm.   Pulmonary/Chest: Effort normal and breath sounds normal. No respiratory distress. She has no wheezes. She has no rales.  Abdominal: Soft. There is tenderness in the suprapubic area. There is no rebound, no guarding, no CVA tenderness, no tenderness at McBurney's point and negative Murphy's sign.       Tenderness is mild  Genitourinary: Vagina normal. Cervix exhibits no discharge. Right adnexum displays tenderness. Right adnexum displays no mass and no fullness. Left adnexum displays tenderness. Left adnexum displays no mass and no fullness.       Chaperone was present. Some reproducible tenderness on pelvic exam. No masses present. Exam is limited by body habitus.  Neurological: She is alert and oriented to person, place, and time.  Skin: Skin is warm and dry.  Psychiatric: She has a normal mood and  affect. Her behavior is normal.    ED Course  Procedures   Results for orders placed during the hospital encounter of 04/03/12  URINALYSIS, ROUTINE W REFLEX MICROSCOPIC      Component Value Range   Color, Urine YELLOW  YELLOW   APPearance CLEAR  CLEAR   Specific Gravity, Urine 1.021  1.005 - 1.030   pH 5.5  5.0 - 8.0   Glucose, UA NEGATIVE  NEGATIVE mg/dL   Hgb urine dipstick NEGATIVE  NEGATIVE   Bilirubin Urine NEGATIVE  NEGATIVE   Ketones, ur NEGATIVE  NEGATIVE mg/dL   Protein, ur NEGATIVE  NEGATIVE mg/dL   Urobilinogen, UA 0.2  0.0 -  1.0 mg/dL   Nitrite NEGATIVE  NEGATIVE   Leukocytes, UA NEGATIVE  NEGATIVE  CBC WITH DIFFERENTIAL      Component Value Range   WBC 8.2  4.0 - 10.5 K/uL   RBC 4.34  3.87 - 5.11 MIL/uL   Hemoglobin 13.7  12.0 - 15.0 g/dL   HCT 47.8  29.5 - 62.1 %   MCV 90.6  78.0 - 100.0 fL   MCH 31.6  26.0 - 34.0 pg   MCHC 34.9  30.0 - 36.0 g/dL   RDW 30.8  65.7 - 84.6 %   Platelets 311  150 - 400 K/uL   Neutrophils Relative 42 (*) 43 - 77 %   Neutro Abs 3.5  1.7 - 7.7 K/uL   Lymphocytes Relative 45  12 - 46 %   Lymphs Abs 3.7  0.7 - 4.0 K/uL   Monocytes Relative 5  3 - 12 %   Monocytes Absolute 0.4  0.1 - 1.0 K/uL   Eosinophils Relative 7 (*) 0 - 5 %   Eosinophils Absolute 0.6  0.0 - 0.7 K/uL   Basophils Relative 1  0 - 1 %   Basophils Absolute 0.1  0.0 - 0.1 K/uL  BASIC METABOLIC PANEL      Component Value Range   Sodium 137  135 - 145 mEq/L   Potassium 3.3 (*) 3.5 - 5.1 mEq/L   Chloride 100  96 - 112 mEq/L   CO2 29  19 - 32 mEq/L   Glucose, Bld 84  70 - 99 mg/dL   BUN 13  6 - 23 mg/dL   Creatinine, Ser 9.62  0.50 - 1.10 mg/dL   Calcium 9.1  8.4 - 95.2 mg/dL   GFR calc non Af Amer 77 (*) >90 mL/min   GFR calc Af Amer 89 (*) >90 mL/min  WET PREP, GENITAL      Component Value Range   Yeast Wet Prep HPF POC NONE SEEN  NONE SEEN   Trich, Wet Prep NONE SEEN  NONE SEEN   Clue Cells Wet Prep HPF POC FEW (*) NONE SEEN   WBC, Wet Prep HPF POC NONE SEEN  NONE SEEN      1. Pelvic pain in female       MDM  8:10 PM patient seen and evaluated. Patient resting comfortably not appearing acute distress or discomfort.  Patient does have some reproducible tenderness on pelvic exam. Patient reports a known history of fibroids and reports she has had some return of the symptoms but is being followed by OB/GYN. No concerning masses appreciated on exam. Normal vaginal exam.  Patient feeling much better after medications. At this time we'll discharge home and patient will followup with  OB/GYN.    Angus Seller, [place] 04/04/12 [phone]

## 2012-04-03 NOTE — ED Notes (Signed)
Bedside report received from previous RN 

## 2012-04-03 NOTE — ED Notes (Signed)
Low back pain onset five days ago. Some lower abdominal pain, denies N/V/D. Endorses constipation. Dysuria,

## 2012-04-04 NOTE — ED Provider Notes (Signed)
Medical screening examination/treatment/procedure(s) were performed by non-physician practitioner and as supervising physician I was immediately available for consultation/collaboration.  Derwood Kaplan, MD 04/04/12 0110

## 2012-04-05 LAB — GC/CHLAMYDIA PROBE AMP, GENITAL
Chlamydia, DNA Probe: NEGATIVE
GC Probe Amp, Genital: NEGATIVE

## 2012-06-23 ENCOUNTER — Emergency Department (HOSPITAL_COMMUNITY)
Admission: EM | Admit: 2012-06-23 | Discharge: 2012-06-23 | Disposition: A | Discharge status: Home / Self Care | Payer: Medicare Other | Attending: Emergency Medicine | Admitting: Emergency Medicine

## 2012-06-23 ENCOUNTER — Emergency Department (HOSPITAL_COMMUNITY): Payer: Medicare Other

## 2012-06-23 ENCOUNTER — Encounter (HOSPITAL_COMMUNITY): Payer: Self-pay | Admitting: Emergency Medicine

## 2012-06-23 DIAGNOSIS — Z8719 Personal history of other diseases of the digestive system: Secondary | ICD-10-CM | POA: Insufficient documentation

## 2012-06-23 DIAGNOSIS — F411 Generalized anxiety disorder: Secondary | ICD-10-CM | POA: Insufficient documentation

## 2012-06-23 DIAGNOSIS — R109 Unspecified abdominal pain: Secondary | ICD-10-CM | POA: Insufficient documentation

## 2012-06-23 DIAGNOSIS — F3289 Other specified depressive episodes: Secondary | ICD-10-CM | POA: Insufficient documentation

## 2012-06-23 DIAGNOSIS — G8929 Other chronic pain: Secondary | ICD-10-CM | POA: Insufficient documentation

## 2012-06-23 DIAGNOSIS — I10 Essential (primary) hypertension: Secondary | ICD-10-CM | POA: Insufficient documentation

## 2012-06-23 DIAGNOSIS — K5900 Constipation, unspecified: Secondary | ICD-10-CM | POA: Insufficient documentation

## 2012-06-23 DIAGNOSIS — Z90710 Acquired absence of both cervix and uterus: Secondary | ICD-10-CM | POA: Insufficient documentation

## 2012-06-23 DIAGNOSIS — F329 Major depressive disorder, single episode, unspecified: Secondary | ICD-10-CM | POA: Insufficient documentation

## 2012-06-23 HISTORY — DX: Constipation, unspecified: K59.00

## 2012-06-23 HISTORY — DX: Other chronic pain: G89.29

## 2012-06-23 HISTORY — DX: Irritable bowel syndrome, unspecified: K58.9

## 2012-06-23 HISTORY — DX: Unspecified abdominal pain: R10.9

## 2012-06-23 LAB — LIPASE, BLOOD: Lipase: 31 U/L (ref 11–59)

## 2012-06-23 LAB — CBC WITH DIFFERENTIAL/PLATELET
Eosinophils Relative: 8 % — ABNORMAL HIGH (ref 0–5)
HCT: 37.1 % (ref 36.0–46.0)
Hemoglobin: 13.2 g/dL (ref 12.0–15.0)
Lymphocytes Relative: 38 % (ref 12–46)
MCHC: 35.6 g/dL (ref 30.0–36.0)
MCV: 89.2 fL (ref 78.0–100.0)
Monocytes Absolute: 0.5 10*3/uL (ref 0.1–1.0)
Monocytes Relative: 7 % (ref 3–12)
Neutro Abs: 3.6 10*3/uL (ref 1.7–7.7)
RDW: 12.6 % (ref 11.5–15.5)
WBC: 7.7 10*3/uL (ref 4.0–10.5)

## 2012-06-23 LAB — URINALYSIS, ROUTINE W REFLEX MICROSCOPIC
Glucose, UA: NEGATIVE mg/dL
Ketones, ur: NEGATIVE mg/dL
Leukocytes, UA: NEGATIVE
Nitrite: NEGATIVE
Protein, ur: NEGATIVE mg/dL
Urobilinogen, UA: 0.2 mg/dL (ref 0.0–1.0)

## 2012-06-23 LAB — COMPREHENSIVE METABOLIC PANEL
BUN: 11 mg/dL (ref 6–23)
CO2: 31 mEq/L (ref 19–32)
Calcium: 9 mg/dL (ref 8.4–10.5)
Chloride: 98 mEq/L (ref 96–112)
Creatinine, Ser: 0.89 mg/dL (ref 0.50–1.10)
GFR calc Af Amer: 88 mL/min — ABNORMAL LOW (ref >90)
GFR calc non Af Amer: 76 mL/min — ABNORMAL LOW (ref >90)
Total Bilirubin: 0.3 mg/dL (ref 0.3–1.2)

## 2012-06-23 MED ORDER — POTASSIUM CHLORIDE 20 MEQ/15ML (10%) PO LIQD
40.0000 meq | Freq: Once | ORAL | Status: AC
  Administered 2012-06-23: 40 meq via ORAL
  Filled 2012-06-23: qty 30

## 2012-06-23 MED ORDER — NAPROXEN 250 MG PO TABS: 250.0000 mg | ORAL_TABLET | Freq: Two times a day (BID) | ORAL | Status: DC

## 2012-06-23 MED ORDER — OXYCODONE-ACETAMINOPHEN 5-325 MG PO TABS
2.0000 | ORAL_TABLET | Freq: Once | ORAL | Status: AC
  Administered 2012-06-23: 2 via ORAL
  Filled 2012-06-23: qty 2

## 2012-06-23 MED ORDER — ONDANSETRON 8 MG PO TBDP
8.0000 mg | ORAL_TABLET | Freq: Once | ORAL | Status: AC
  Administered 2012-06-23: 8 mg via ORAL
  Filled 2012-06-23: qty 1

## 2012-06-23 MED ORDER — KETOROLAC TROMETHAMINE 60 MG/2ML IM SOLN
60.0000 mg | Freq: Once | INTRAMUSCULAR | Status: AC
  Administered 2012-06-23: 60 mg via INTRAMUSCULAR
  Filled 2012-06-23: qty 2

## 2012-06-23 MED ORDER — TRAMADOL HCL 50 MG PO TABS: 50.0000 mg | ORAL_TABLET | Freq: Four times a day (QID) | ORAL | Status: DC | PRN

## 2012-06-23 NOTE — ED Provider Notes (Signed)
History     CSN: [id number]  Arrival date & time 06/23/12  1231   First MD Initiated Contact with Patient 06/23/12 1253      Chief Complaint  Patient presents with  . Shortness of Breath  . Abdominal Pain     HPI Pt was seen at 1340.  Per pt, c/o gradual onset and persistence of constant acute flair of her chronic abd pain "since 2007."  States she has been "worse" over the past 4 months. Has been associated with constipation and the passage of "hard balls" of stool.  States she has a GI MD appt scheduled in the next 2-3 weeks. Denies any change in her usual chronic symptoms.  Denies N/V/D, no black or blood in stools, no rectal pain, no fevers, no back pain, no CP/SOB.    Past Medical History  Diagnosis Date  . Hypertension   . Depression   . Anxiety   . Chronic abdominal pain   . IBS (irritable bowel syndrome)   . Constipation     Past Surgical History  Procedure Date  . Abdominal hysterectomy     History  Substance Use Topics  . Smoking status: Never Smoker   . Smokeless tobacco: Never Used  . Alcohol Use: No      Review of Systems ROS: Statement: All systems negative except as marked or noted in the HPI; Constitutional: Negative for fever and chills. ; ; Eyes: Negative for eye pain, redness and discharge. ; ; ENMT: Negative for ear pain, hoarseness, nasal congestion, sinus pressure and sore throat. ; ; Cardiovascular: Negative for chest pain, palpitations, diaphoresis, dyspnea and peripheral edema. ; ; Respiratory: Negative for cough, wheezing and stridor. ; ; Gastrointestinal: +abd pain, constipation. Negative for nausea, vomiting, diarrhea, blood in stool, hematemesis, jaundice and rectal bleeding. . ; ; Genitourinary: Negative for dysuria, flank pain and hematuria. ; ; Musculoskeletal: Negative for back pain and neck pain. Negative for swelling and trauma.; ; Skin: Negative for pruritus, rash, abrasions, blisters, bruising and skin lesion.; ; Neuro: Negative for  headache, lightheadedness and neck stiffness. Negative for weakness, altered level of consciousness , altered mental status, extremity weakness, paresthesias, involuntary movement, seizure and syncope.     Allergies  Penicillins  Home Medications   Current Outpatient Rx  Name  Route  Sig  Dispense  Refill  . ALPRAZOLAM 0.5 MG PO TABS   Oral   Take 0.5 mg by mouth 3 (three) times daily as needed. For anxiety.         . FUROSEMIDE 20 MG PO TABS   Oral   Take 20 mg by mouth daily.         Marland Kitchen ZOLPIDEM TARTRATE 10 MG PO TABS   Oral   Take 10 mg by mouth at bedtime.         Marland Kitchen NAPROXEN 250 MG PO TABS   Oral   Take 1 tablet (250 mg total) by mouth 2 (two) times daily with a meal.   14 tablet   0   . TRAMADOL HCL 50 MG PO TABS   Oral   Take 1 tablet (50 mg total) by mouth every 6 (six) hours as needed for pain.   15 tablet   0     BP 137/81  Pulse 80  Temp 98 F (36.7 C) (Oral)  Resp 20  Ht 5\' 4"  (1.626 m)  Wt 226 lb (102.513 kg)  BMI 38.79 kg/m2  SpO2 97%  Physical Exam 1345: Physical  examination:  Nursing notes reviewed; Vital signs and O2 SAT reviewed;  Constitutional: Well developed, Well nourished, Well hydrated, In no acute distress; Head:  Normocephalic, atraumatic; Eyes: EOMI, PERRL, No scleral icterus; ENMT: Mouth and pharynx normal, Mucous membranes moist; Neck: Supple, Full range of motion, No lymphadenopathy; Cardiovascular: Regular rate and rhythm, No murmur, rub, or gallop; Respiratory: Breath sounds clear & equal bilaterally, No rales, rhonchi, wheezes.  Speaking full sentences with ease, Normal respiratory effort/excursion; Chest: Nontender, Movement normal; Abdomen: Soft, Nontender, Nondistended, Normal bowel sounds; Genitourinary: No CVA tenderness; Extremities: Pulses normal, No tenderness, No edema, No calf edema or asymmetry.; Neuro: AA&Ox3, Major CN grossly intact.  Speech clear. No gross focal motor or sensory deficits in extremities.; Skin: Color  normal, Warm, Dry.   ED Course  Procedures    MDM  MDM Reviewed: nursing note, vitals and previous chart Reviewed previous: labs Interpretation: labs and x-ray     Results for orders placed during the hospital encounter of 06/23/12  CBC WITH DIFFERENTIAL      Component Value Range   WBC 7.7  4.0 - 10.5 K/uL   RBC 4.16  3.87 - 5.11 MIL/uL   Hemoglobin 13.2  12.0 - 15.0 g/dL   HCT 16.1  09.6 - 04.5 %   MCV 89.2  78.0 - 100.0 fL   MCH 31.7  26.0 - 34.0 pg   MCHC 35.6  30.0 - 36.0 g/dL   RDW 40.9  81.1 - 91.4 %   Platelets 319  150 - 400 K/uL   Neutrophils Relative 47  43 - 77 %   Neutro Abs 3.6  1.7 - 7.7 K/uL   Lymphocytes Relative 38  12 - 46 %   Lymphs Abs 3.0  0.7 - 4.0 K/uL   Monocytes Relative 7  3 - 12 %   Monocytes Absolute 0.5  0.1 - 1.0 K/uL   Eosinophils Relative 8 (*) 0 - 5 %   Eosinophils Absolute 0.6  0.0 - 0.7 K/uL   Basophils Relative 1  0 - 1 %   Basophils Absolute 0.1  0.0 - 0.1 K/uL  COMPREHENSIVE METABOLIC PANEL      Component Value Range   Sodium 140  135 - 145 mEq/L   Potassium 3.2 (*) 3.5 - 5.1 mEq/L   Chloride 98  96 - 112 mEq/L   CO2 31  19 - 32 mEq/L   Glucose, Bld 86  70 - 99 mg/dL   BUN 11  6 - 23 mg/dL   Creatinine, Ser 7.82  0.50 - 1.10 mg/dL   Calcium 9.0  8.4 - 95.6 mg/dL   Total Protein 6.6  6.0 - 8.3 g/dL   Albumin 3.9  3.5 - 5.2 g/dL   AST 16  0 - 37 U/L   ALT 13  0 - 35 U/L   Alkaline Phosphatase 79  39 - 117 U/L   Total Bilirubin 0.3  0.3 - 1.2 mg/dL   GFR calc non Af Amer 76 (*) >90 mL/min   GFR calc Af Amer 88 (*) >90 mL/min  LIPASE, BLOOD      Component Value Range   Lipase 31  11 - 59 U/L  URINALYSIS, ROUTINE W REFLEX MICROSCOPIC      Component Value Range   Color, Urine YELLOW  YELLOW   APPearance CLEAR  CLEAR   Specific Gravity, Urine 1.011  1.005 - 1.030   pH 6.0  5.0 - 8.0   Glucose, UA NEGATIVE  NEGATIVE mg/dL  Hgb urine dipstick NEGATIVE  NEGATIVE   Bilirubin Urine NEGATIVE  NEGATIVE   Ketones, ur NEGATIVE   NEGATIVE mg/dL   Protein, ur NEGATIVE  NEGATIVE mg/dL   Urobilinogen, UA 0.2  0.0 - 1.0 mg/dL   Nitrite NEGATIVE  NEGATIVE   Leukocytes, UA NEGATIVE  NEGATIVE   Dg Abd Acute W/chest 06/23/2012  *RADIOLOGY REPORT*  Clinical Data: Abdominal pain, nausea, vomiting, rule out small bowel obstruction  ACUTE ABDOMEN SERIES (ABDOMEN 2 VIEW & CHEST 1 VIEW)  Comparison: 02/05/2012  Findings: Cardiomediastinal silhouette is stable.  No acute infiltrate or pleural effusion.  No pulmonary edema.  There is nonspecific nonobstructive bowel gas pattern.  Moderate stool noted in the right colon and transverse colon.  Pelvic phleboliths are noted.  No free abdominal air.  IMPRESSION: No acute disease.  Nonspecific nonobstructive bowel gas pattern. Moderate stool noted in proximal colon.  No free abdominal air.   Original Report Authenticated By: Natasha Mead, M.D.      1600:  Long hx of chronic abd pain, no change in her usual symptoms today. No acute findings on workup today.  Pt talking on cellphone most of ED visit.  NAD, resps easy, abd remains soft/NT.  Long d/w pt regarding tx of constipation by myself and ED RN.  Dx and testing d/w pt.  Questions answered.  Verb understanding, agreeable to d/c home with outpt f/u.       Laray Anger, DO 06/25/12 1339

## 2012-06-23 NOTE — ED Notes (Signed)
Pt states that she has been having abd pain since July but has been having "colon problems" since 2007.  Pt states that she feels constipated with her last normal BM being last Thursday.  She states that she is passing "hard balls".  Pt's bowel sounds are normal in all quadrants.  Pt states that she also wants her cholesterol and liver levels checked.

## 2012-06-23 NOTE — ED Notes (Signed)
Pt reports 10/10 abdominal pain with shortness of breath. Pt reports abdominal pain has been going on since 2007 with ongoing constipation, which has worsened over time. Pt has history of diverticulitis. Bowel sounds are normoactive, pt reports last bowel movement occurred last Thursday. Breath sounds CTA, breaths equal and symmetrical, and oxygen sats 99% on room air.

## 2012-06-23 NOTE — ED Notes (Signed)
Unable to obtain blood for labs.patient in radiology.will obtain labs upon patient's return

## 2012-06-25 LAB — URINE CULTURE: Colony Count: NO GROWTH

## 2012-07-13 ENCOUNTER — Ambulatory Visit: Payer: Medicare Other | Admitting: Gastroenterology

## 2012-08-02 ENCOUNTER — Ambulatory Visit: Payer: Medicare Other

## 2012-08-02 ENCOUNTER — Ambulatory Visit (INDEPENDENT_AMBULATORY_CARE_PROVIDER_SITE_OTHER): Payer: Medicare Other | Admitting: Internal Medicine

## 2012-08-02 VITALS — BP 167/110 | HR 83 | Temp 97.9°F | Resp 18 | Ht 66.0 in | Wt 213.0 lb

## 2012-08-02 DIAGNOSIS — S8012XA Contusion of left lower leg, initial encounter: Secondary | ICD-10-CM

## 2012-08-02 DIAGNOSIS — M79609 Pain in unspecified limb: Secondary | ICD-10-CM

## 2012-08-02 DIAGNOSIS — M79669 Pain in unspecified lower leg: Secondary | ICD-10-CM

## 2012-08-02 DIAGNOSIS — S8011XA Contusion of right lower leg, initial encounter: Secondary | ICD-10-CM

## 2012-08-02 DIAGNOSIS — I10 Essential (primary) hypertension: Secondary | ICD-10-CM

## 2012-08-02 DIAGNOSIS — S8010XA Contusion of unspecified lower leg, initial encounter: Secondary | ICD-10-CM

## 2012-08-02 NOTE — Progress Notes (Signed)
  Subjective:    Patient ID: Dorothy Alvarez, female    DOB: [birth year], 46 y.o.   MRN: [id number]  HPI First urgent medical and family care visit for an injury [place] [employer], and somehow slipped and fell striking both legs on the foot pads-female limping and pain with swelling on the front part of both lower legs/is in pain even with nonweightbearing  In exam room No prior injuries to legs  Currently on tramadol and Naprosyn for abdominal pain Also on medication for hypertension and anxiety   Review of Systems No chest pain or shortness of breath/no peripheral edema    Objective:   Physical Exam Vital signs blood pressure 167/110 Weight 213 pounds Able to talk and give history without distraction Hips have good range of motion Knees are intact Both anterior tibial areas have localized swelling that is exquisitely tender midshaft Calf muscles are nontender Ankles are intact Initial gait is affected      [facility] reading (PRIMARY) by  Dr.Om Lizotte= no fx either tibia/?bony density adjacent to L fib head(nontender there)-chronic process   Assessment & Plan:   1. Contusion of leg, left   2. Contusion of leg, right   3. Pain, lower leg   4. Hypertension, uncontrolled    Needs to be nonweightbearing tonight/use ice for 30 minutes every 2 hours as needed for pain control Okay to continue her regular medications Gave her a letter suggesting nonweightbearing exercises only for the next 7 days She requested this letter because she is afraid that the [employer] will require her to pay for the use of the gym despite being unable to continue her planned exercise there She is very skeptical that ice and Naprosyn plus or minus tramadol will actually help control her current pain and so I offered that she could return within 24-48 hours if she was unhappy with that control, and then we would reevaluate her. If she is unable to return to weightbearing activities within a  week, I offered to schedule physical therapy.

## 2012-08-06 ENCOUNTER — Ambulatory Visit (INDEPENDENT_AMBULATORY_CARE_PROVIDER_SITE_OTHER): Payer: Medicare Other | Admitting: Gastroenterology

## 2012-08-06 ENCOUNTER — Encounter: Payer: Self-pay | Admitting: Gastroenterology

## 2012-08-06 VITALS — BP 162/100 | HR 64 | Ht 66.25 in | Wt 218.2 lb

## 2012-08-06 DIAGNOSIS — K5904 Chronic idiopathic constipation: Secondary | ICD-10-CM

## 2012-08-06 DIAGNOSIS — K5909 Other constipation: Secondary | ICD-10-CM

## 2012-08-06 MED ORDER — LACTULOSE 10 GM/15ML PO SOLN: 10.0000 g | Freq: Two times a day (BID) | ORAL | Status: DC

## 2012-08-06 NOTE — Patient Instructions (Addendum)
Trial of lactulose liquid, take 15ml twice a day. Please call to report on your symptoms in 4-5 weeks.

## 2012-08-06 NOTE — Progress Notes (Signed)
Review of gastrointestinal problems:  1. Left (sigmoid/descending) diverticulitis noted on CT scans (two of them) Fall 2010. Was under care of Dr. Jeani Hawking, did not follow up with him due to billing issues. Colonoscopy May 2010 found diverticulosis, single small TA (Dr. Elnoria Howard). Has LLQ pains, cipro/flagyl course (twice previously) and now on her second course of Augmentin.  2. Adenomatous colon polyps, Dr. Elnoria Howard colonoscopy 12/2008. needs repeat colonoscopy 5, 2015.  3. left ovarian complex cyst; noted 07/2009, sent to gynecology. ?cause of recent pains.  Was referred to gynecologist in 2012 but she never saw him.   HPI: This is a   46 year old woman whom I last saw about a year and a half ago.  Her constipation has "gotten worse." has BM about once a week.  Told me 4 times per month 2 years ago.  Tells me her doctor gave her a pill and she cannot recall the name of it and it.    She does not recall ever trying amitiza that I wrote for her at last.       Past Medical History  Diagnosis Date  . Hypertension   . Depression   . Anxiety   . Chronic abdominal pain   . IBS (irritable bowel syndrome)   . Constipation     Past Surgical History  Procedure Date  . Abdominal hysterectomy     Current Outpatient Prescriptions  Medication Sig Dispense Refill  . ALPRAZolam (XANAX) 0.5 MG tablet Take 0.5 mg by mouth 3 (three) times daily as needed. For anxiety.      . furosemide (LASIX) 20 MG tablet Take 20 mg by mouth daily.      . naproxen (NAPROSYN) 250 MG tablet Take 1 tablet (250 mg total) by mouth 2 (two) times daily with a meal.  14 tablet  0  . traMADol (ULTRAM) 50 MG tablet Take 1 tablet (50 mg total) by mouth every 6 (six) hours as needed for pain.  15 tablet  0  . zolpidem (AMBIEN) 10 MG tablet Take 10 mg by mouth at bedtime.        Allergies as of 08/06/2012 - Review Complete 08/06/2012  Allergen Reaction Noted  . Penicillins Itching     Family History  Problem Relation  Age of Onset  . Diabetes Mother   . Heart disease Father     History   Social History  . Marital Status: Legally Separated    Spouse Name: N/A    Number of Children: 2  . Years of Education: N/A   Occupational History  . DISABLED    Social History Main Topics  . Smoking status: Never Smoker   . Smokeless tobacco: Never Used  . Alcohol Use: No  . Drug Use: No  . Sexually Active: No   Other Topics Concern  . Not on file   Social History Narrative  . No narrative on file      Physical Exam: BP 162/100  Pulse 64  Ht 5' 6.25" (1.683 m)  Wt 218 lb 3.2 oz (98.975 kg)  BMI 34.95 kg/m2 Constitutional: generally well-appearing Psychiatric: alert and oriented x3 Abdomen: soft, nontender, nondistended, no obvious ascites, no peritoneal signs, normal bowel sounds     Assessment and plan: 46 y.o. female with chronic constipation  She did not remember the [place]  which I prescribed for her 2 years ago. At that time she was very insistent on a pill form. She insists that now pills have "never worked" and she  can only accept a liquid form. We went back and forth between MiraLax and lactulose and eventually decided on a trial of lactulose. She'll call to report on her symptoms in several weeks.

## 2012-10-04 ENCOUNTER — Other Ambulatory Visit (HOSPITAL_COMMUNITY): Payer: Self-pay | Admitting: Obstetrics and Gynecology

## 2012-10-04 DIAGNOSIS — R102 Pelvic and perineal pain: Secondary | ICD-10-CM

## 2012-10-07 ENCOUNTER — Ambulatory Visit (HOSPITAL_COMMUNITY): Payer: Medicare Other

## 2012-10-11 ENCOUNTER — Encounter (HOSPITAL_COMMUNITY): Payer: Self-pay

## 2012-10-11 ENCOUNTER — Ambulatory Visit (HOSPITAL_COMMUNITY)
Admission: RE | Admit: 2012-10-11 | Discharge: 2012-10-11 | Disposition: A | Discharge status: Home / Self Care | Payer: Medicare Other | Source: Ambulatory Visit | Attending: Obstetrics and Gynecology | Admitting: Obstetrics and Gynecology

## 2012-10-11 DIAGNOSIS — K7689 Other specified diseases of liver: Secondary | ICD-10-CM | POA: Insufficient documentation

## 2012-10-11 DIAGNOSIS — K589 Irritable bowel syndrome without diarrhea: Secondary | ICD-10-CM | POA: Insufficient documentation

## 2012-10-11 DIAGNOSIS — K5730 Diverticulosis of large intestine without perforation or abscess without bleeding: Secondary | ICD-10-CM | POA: Insufficient documentation

## 2012-10-11 DIAGNOSIS — R102 Pelvic and perineal pain: Secondary | ICD-10-CM

## 2012-10-11 DIAGNOSIS — R109 Unspecified abdominal pain: Secondary | ICD-10-CM | POA: Insufficient documentation

## 2012-10-11 DIAGNOSIS — N949 Unspecified condition associated with female genital organs and menstrual cycle: Secondary | ICD-10-CM | POA: Insufficient documentation

## 2012-10-11 MED ORDER — IOHEXOL 300 MG/ML  SOLN
100.0000 mL | Freq: Once | INTRAMUSCULAR | Status: AC | PRN
  Administered 2012-10-11: 100 mL via INTRAVENOUS

## 2012-12-29 ENCOUNTER — Emergency Department (HOSPITAL_COMMUNITY): Payer: Medicare Other

## 2012-12-29 ENCOUNTER — Emergency Department (HOSPITAL_COMMUNITY)
Admission: EM | Admit: 2012-12-29 | Discharge: 2012-12-29 | Disposition: A | Discharge status: Home / Self Care | Payer: Medicare Other | Attending: Emergency Medicine | Admitting: Emergency Medicine

## 2012-12-29 ENCOUNTER — Encounter (HOSPITAL_COMMUNITY): Payer: Self-pay | Admitting: *Deleted

## 2012-12-29 DIAGNOSIS — F411 Generalized anxiety disorder: Secondary | ICD-10-CM | POA: Insufficient documentation

## 2012-12-29 DIAGNOSIS — Z8719 Personal history of other diseases of the digestive system: Secondary | ICD-10-CM | POA: Insufficient documentation

## 2012-12-29 DIAGNOSIS — H53149 Visual discomfort, unspecified: Secondary | ICD-10-CM | POA: Insufficient documentation

## 2012-12-29 DIAGNOSIS — F3289 Other specified depressive episodes: Secondary | ICD-10-CM | POA: Insufficient documentation

## 2012-12-29 DIAGNOSIS — R51 Headache: Secondary | ICD-10-CM | POA: Insufficient documentation

## 2012-12-29 DIAGNOSIS — N83209 Unspecified ovarian cyst, unspecified side: Secondary | ICD-10-CM

## 2012-12-29 DIAGNOSIS — G8929 Other chronic pain: Secondary | ICD-10-CM | POA: Insufficient documentation

## 2012-12-29 DIAGNOSIS — R110 Nausea: Secondary | ICD-10-CM | POA: Insufficient documentation

## 2012-12-29 DIAGNOSIS — I10 Essential (primary) hypertension: Secondary | ICD-10-CM | POA: Insufficient documentation

## 2012-12-29 DIAGNOSIS — R1084 Generalized abdominal pain: Secondary | ICD-10-CM | POA: Insufficient documentation

## 2012-12-29 DIAGNOSIS — Z90710 Acquired absence of both cervix and uterus: Secondary | ICD-10-CM | POA: Insufficient documentation

## 2012-12-29 DIAGNOSIS — F329 Major depressive disorder, single episode, unspecified: Secondary | ICD-10-CM | POA: Insufficient documentation

## 2012-12-29 DIAGNOSIS — Z79899 Other long term (current) drug therapy: Secondary | ICD-10-CM | POA: Insufficient documentation

## 2012-12-29 DIAGNOSIS — Z880 Allergy status to penicillin: Secondary | ICD-10-CM | POA: Insufficient documentation

## 2012-12-29 LAB — CBC WITH DIFFERENTIAL/PLATELET
Eosinophils Relative: 8 % — ABNORMAL HIGH (ref 0–5)
Hemoglobin: 12.7 g/dL (ref 12.0–15.0)
Lymphocytes Relative: 34 % (ref 12–46)
Lymphs Abs: 2.7 10*3/uL (ref 0.7–4.0)
MCV: 89.9 fL (ref 78.0–100.0)
Monocytes Relative: 5 % (ref 3–12)
Platelets: 278 10*3/uL (ref 150–400)
RBC: 4.05 MIL/uL (ref 3.87–5.11)
WBC: 7.8 10*3/uL (ref 4.0–10.5)

## 2012-12-29 LAB — POCT I-STAT, CHEM 8
BUN: 7 mg/dL (ref 6–23)
Creatinine, Ser: 0.9 mg/dL (ref 0.50–1.10)
Hemoglobin: 12.9 g/dL (ref 12.0–15.0)
Potassium: 3.8 mEq/L (ref 3.5–5.1)
Sodium: 141 mEq/L (ref 135–145)
TCO2: 32 mmol/L (ref 0–100)

## 2012-12-29 MED ORDER — LISINOPRIL 10 MG PO TABS: 10.0000 mg | ORAL_TABLET | Freq: Every day | ORAL | Status: DC

## 2012-12-29 MED ORDER — LISINOPRIL 20 MG PO TABS
20.0000 mg | ORAL_TABLET | Freq: Once | ORAL | Status: AC
  Administered 2012-12-29: 20 mg via ORAL
  Filled 2012-12-29: qty 1

## 2012-12-29 MED ORDER — KETOROLAC TROMETHAMINE 30 MG/ML IJ SOLN
30.0000 mg | Freq: Once | INTRAMUSCULAR | Status: AC
  Administered 2012-12-29: 30 mg via INTRAVENOUS
  Filled 2012-12-29: qty 1

## 2012-12-29 MED ORDER — ONDANSETRON HCL 4 MG/2ML IJ SOLN
4.0000 mg | Freq: Once | INTRAMUSCULAR | Status: AC
  Administered 2012-12-29: 4 mg via INTRAVENOUS
  Filled 2012-12-29: qty 2

## 2012-12-29 MED ORDER — HYDROCHLOROTHIAZIDE 12.5 MG PO CAPS
25.0000 mg | ORAL_CAPSULE | Freq: Once | ORAL | Status: AC
  Administered 2012-12-29: 25 mg via ORAL
  Filled 2012-12-29: qty 2

## 2012-12-29 MED ORDER — OXYCODONE-ACETAMINOPHEN 5-325 MG PO TABS
2.0000 | ORAL_TABLET | Freq: Once | ORAL | Status: AC
  Administered 2012-12-29: 2 via ORAL
  Filled 2012-12-29: qty 2

## 2012-12-29 MED ORDER — HYDROCHLOROTHIAZIDE 25 MG PO TABS: 25.0000 mg | ORAL_TABLET | Freq: Every day | ORAL | Status: DC

## 2012-12-29 NOTE — ED Provider Notes (Signed)
History     CSN: [id number]  Arrival date & time 12/29/12  1348   First MD Initiated Contact with Patient 12/29/12 1437      Chief Complaint  Patient presents with  . Hypertension    (Consider location/radiation/quality/duration/timing/severity/associated sxs/prior treatment) HPI Comments: 47 y.o. Female with PMHx of HTN, anxiety, chronic abdominal pain, IBS, and constipation presents today for chronic abdominal pain and HTN to 160s/100s. Pt was at an appointment at [facility] for a transvaginal ultrasound for her chronic abdominal issues. Before the exam began, she was found to be hypertensive at 160s/100s. She was BIBEMS for evaluation of hypertension. Pt admits gradual onset frontal headache, blurred vision, nausea, and central lower diffuse abdominal pain which is similar to her chronic abdominal pain. Pt denies vomiting, constipation, diarrhea.   Pt states this abdominal pain began 6 years ago after her hysterectomy. She went back to the surgeon a few times and later stopped seeing him after he appeared to the pt to "want to get rid of her." She did start seeing another provider, but also stopped following up with them after a financial falling out. Pt has most recently been working with her PCP Dr. Pecola Leisure [facility]) who has been managing her pain with Toradol and Vicoprofen. Pt received CT [facility] that showed fibroids, and was discharged to follow up with OBGYN. Past CT scans and ultrasounds show ovarian cysts, diverticulosis, but nothing emergent or pathological. Pt has been  persistently hypertensive over last few ER visits (to 160s/100s) and regularly sees Dr. Pecola Leisure [facility]). Pt last visit was last month and pt is scheduled to see her next week.   Patient is a 47 y.o. female presenting with hypertension.  Hypertension Associated symptoms include abdominal pain, headaches and nausea. Pertinent negatives include no chest pain, diaphoresis,  fever, neck pain, numbness, rash, vomiting or weakness.    Past Medical History  Diagnosis Date  . Hypertension   . Depression   . Anxiety   . Chronic abdominal pain   . IBS (irritable bowel syndrome)   . Constipation     Past Surgical History  Procedure Laterality Date  . Abdominal hysterectomy      Family History  Problem Relation Age of Onset  . Diabetes Mother   . Heart disease Father     History  Substance Use Topics  . Smoking status: Never Smoker   . Smokeless tobacco: Never Used  . Alcohol Use: No    OB History   Grav Para Term Preterm Abortions TAB SAB Ect Mult Living                  Review of Systems  Constitutional: Negative for fever and diaphoresis.  HENT: Negative for neck pain and neck stiffness.   Eyes: Positive for photophobia. Negative for visual disturbance.  Respiratory: Negative for apnea, chest tightness and shortness of breath.   Cardiovascular: Negative for chest pain and palpitations.  Gastrointestinal: Positive for nausea and abdominal pain. Negative for vomiting, diarrhea and constipation.       Diffuse central lower abdominal pain radiating to the right  Genitourinary: Negative for dysuria.  Musculoskeletal: Negative for gait problem.  Skin: Negative for rash.  Neurological: Positive for headaches. Negative for dizziness, weakness, light-headedness and numbness.    Allergies  Penicillins  Home Medications   Current Outpatient Rx  Name  Route  Sig  Dispense  Refill  . ALPRAZolam (XANAX) 1 MG tablet   Oral  Take 1 mg by mouth 3 (three) times daily.         . DiphenhydrAMINE HCl (BENADRYL ALLERGY PO)   Oral   Take 2 tablets by mouth daily as needed (Sinus allergies).         . furosemide (LASIX) 20 MG tablet   Oral   Take 20 mg by mouth daily.         Marland Kitchen gabapentin (NEURONTIN) 300 MG capsule   Oral   Take 300 mg by mouth daily as needed (Nerve pain).         Marland Kitchen HYDROcodone-ibuprofen (VICOPROFEN) 7.5-200 MG per  tablet   Oral   Take 2 tablets by mouth 4 (four) times daily as needed for pain.         . traMADol (ULTRAM) 50 MG tablet   Oral   Take 50-100 mg by mouth daily as needed for pain.         Marland Kitchen zolpidem (AMBIEN CR) 12.5 MG CR tablet   Oral   Take 12.5 mg by mouth at bedtime as needed for sleep.           BP 166/104  Pulse 81  Temp(Src) 99 F (37.2 C) (Oral)  Resp 18  Ht 5\' 4"  (1.626 m)  Wt 230 lb (104.327 kg)  BMI 39.46 kg/m2  SpO2 99%  Physical Exam  Nursing note and vitals reviewed. Constitutional: She is oriented to person, place, and time. She appears well-developed and well-nourished. No distress.  HENT:  Head: Normocephalic and atraumatic.  Eyes: Conjunctivae and EOM are normal.  Neck: Normal range of motion. Neck supple.  No meningeal signs  Cardiovascular: Normal rate, regular rhythm and normal heart sounds.  Exam reveals no gallop and no friction rub.   No murmur heard. Pulmonary/Chest: Effort normal and breath sounds normal. No respiratory distress. She has no wheezes. She has no rales. She exhibits no tenderness.  Abdominal: Soft. Bowel sounds are normal. She exhibits no distension. There is no tenderness. There is no rebound and no guarding.  Mild tenderness to palpation, centrally radiating to right, no pain at McBurney's point, no rovsing's sign  Musculoskeletal: Normal range of motion. She exhibits no edema and no tenderness.  Neurological: She is alert and oriented to person, place, and time. No cranial nerve deficit.  Skin: Skin is warm and dry. She is not diaphoretic. No erythema.    ED Course  Procedures (including critical care time)  Labs Reviewed  CBC WITH DIFFERENTIAL - Abnormal; Notable for the following:    Eosinophils Relative 8 (*)    All other components within normal limits  POCT I-STAT, CHEM 8   US Transvaginal Non-ob  12/29/2012   *RADIOLOGY REPORT*  Clinical Data:  Chronic pelvic pain. History of hysterectomy. History of ovarian  cysts.  TRANSABDOMINAL AND TRANSVAGINAL ULTRASOUND OF PELVIS DOPPLER ULTRASOUND OF OVARIES  Technique:  Both transabdominal and transvaginal ultrasound examinations of the pelvis were performed. Transabdominal technique was performed for global imaging of the pelvis including uterus, ovaries, adnexal regions, and pelvic cul-de-sac.  It was necessary to proceed with endovaginal exam following the transabdominal exam to visualize the ovaries.  Color and duplex Doppler ultrasound was utilized to evaluate blood flow to the ovaries.  Comparison: 01/16/2012  Findings:  Uterus:  Removed.  Right ovary: There is vascular flow in the right ovary.  The right ovary measures 3.6 x 2.1 x 3.3 cm.  There is a collapsing cyst or follicle in the right ovary that measures  up to 1.5 cm.  There is an additional follicle measuring up to 1.1 cm.  Left ovary: Vascular flow in the left ovary.  Left ovary measures 4.5 x 2.7 x 1.9 cm.  Dominant follicle measures up to 1.7 cm.  Pulsed Doppler evaluation demonstrates normal low-resistance arterial and venous waveforms in both ovaries.  Other findings:  There may be a trace amount of free fluid.  IMPRESSION:  Normal exam.  No evidence of pelvic mass or other significant abnormality.  Post hysterectomy.  No sonographic evidence for ovarian torsion.   Original Report Authenticated By: Richarda Overlie, M.D.   US Pelvis Complete  12/29/2012   *RADIOLOGY REPORT*  Clinical Data:  Chronic pelvic pain. History of hysterectomy. History of ovarian cysts.  TRANSABDOMINAL AND TRANSVAGINAL ULTRASOUND OF PELVIS DOPPLER ULTRASOUND OF OVARIES  Technique:  Both transabdominal and transvaginal ultrasound examinations of the pelvis were performed. Transabdominal technique was performed for global imaging of the pelvis including uterus, ovaries, adnexal regions, and pelvic cul-de-sac.  It was necessary to proceed with endovaginal exam following the transabdominal exam to visualize the ovaries.  Color and duplex  Doppler ultrasound was utilized to evaluate blood flow to the ovaries.  Comparison: 01/16/2012  Findings:  Uterus:  Removed.  Right ovary: There is vascular flow in the right ovary.  The right ovary measures 3.6 x 2.1 x 3.3 cm.  There is a collapsing cyst or follicle in the right ovary that measures up to 1.5 cm.  There is an additional follicle measuring up to 1.1 cm.  Left ovary: Vascular flow in the left ovary.  Left ovary measures 4.5 x 2.7 x 1.9 cm.  Dominant follicle measures up to 1.7 cm.  Pulsed Doppler evaluation demonstrates normal low-resistance arterial and venous waveforms in both ovaries.  Other findings:  There may be a trace amount of free fluid.  IMPRESSION:  Normal exam.  No evidence of pelvic mass or other significant abnormality.  Post hysterectomy.  No sonographic evidence for ovarian torsion.   Original Report Authenticated By: Richarda Overlie, M.D.   [place] Art/ven Flow Abd Pelv Doppler  12/29/2012   *RADIOLOGY REPORT*  Clinical Data:  Chronic pelvic pain. History of hysterectomy. History of ovarian cysts.  TRANSABDOMINAL AND TRANSVAGINAL ULTRASOUND OF PELVIS DOPPLER ULTRASOUND OF OVARIES  Technique:  Both transabdominal and transvaginal ultrasound examinations of the pelvis were performed. Transabdominal technique was performed for global imaging of the pelvis including uterus, ovaries, adnexal regions, and pelvic cul-de-sac.  It was necessary to proceed with endovaginal exam following the transabdominal exam to visualize the ovaries.  Color and duplex Doppler ultrasound was utilized to evaluate blood flow to the ovaries.  Comparison: 01/16/2012  Findings:  Uterus:  Removed.  Right ovary: There is vascular flow in the right ovary.  The right ovary measures 3.6 x 2.1 x 3.3 cm.  There is a collapsing cyst or follicle in the right ovary that measures up to 1.5 cm.  There is an additional follicle measuring up to 1.1 cm.  Left ovary: Vascular flow in the left ovary.  Left ovary measures 4.5 x 2.7 x 1.9  cm.  Dominant follicle measures up to 1.7 cm.  Pulsed Doppler evaluation demonstrates normal low-resistance arterial and venous waveforms in both ovaries.  Other findings:  There may be a trace amount of free fluid.  IMPRESSION:  Normal exam.  No evidence of pelvic mass or other significant abnormality.  Post hysterectomy.  No sonographic evidence for ovarian torsion.   Original Report Authenticated  By: Richarda Overlie, M.D.    Medications  hydrochlorothiazide (MICROZIDE) capsule 25 mg (25 mg Oral Given 12/29/12 1536)  ketorolac (TORADOL) 30 MG/ML injection 30 mg (30 mg Intravenous Given 12/29/12 1536)  ondansetron (ZOFRAN) injection 4 mg (4 mg Intravenous Given 12/29/12 1536)  ketorolac (TORADOL) 30 MG/ML injection 30 mg (30 mg Intravenous Given 12/29/12 1640)  oxyCODONE-acetaminophen (PERCOCET/ROXICET) 5-325 MG per tablet 2 tablet (2 tablets Oral Given 12/29/12 1640)  lisinopril (PRINIVIL,ZESTRIL) tablet 20 mg (20 mg Oral Given 12/29/12 1752)   Discharge Medication List as of 12/29/2012  7:01 PM    START taking these medications   Details  hydrochlorothiazide (HYDRODIURIL) 25 MG tablet Take 1 tablet (25 mg total) by mouth daily., Starting 12/29/2012, Until Discontinued, Print    lisinopril (PRINIVIL,ZESTRIL) 10 MG tablet Take 1 tablet (10 mg total) by mouth daily., Starting 12/29/2012, Until Discontinued, Print        1. Hypertension   2. Ovarian cyst       MDM  Patient is nontoxic, nonseptic appearing, in no apparent distress.  Patient's pain and other symptoms adequately managed in emergency department.  Blood pressure, while elevated, is similar to blood pressures from previous visits to the ED.  Labs, imaging and vitals reviewed.  Unconcerning. Imaging shows similar results to previous including possible collapsing cyst or follicle. No evidence of pelvic mass or torsion.   On repeat exam patient does not have a surgical abdomen and there are no peritoneal signs.  No indication of appendicitis,  bowel obstruction, bowel perforation, cholecystitis, diverticulitis, PID or ectopic pregnancy.  Discussed findings with pt who was grateful for the imaging which was what she had planned for today at [facility] clinic. Started pt on lisinopril and HCTZ and informed pt to stop lasix while on HCTZ. Pt states she had run out of her prescription anyway. Pt states she has an appointment with her primary care doctor next week. Encouraged pt to keep that appt, discuss today's findings, and her uncontrolled hypertension. Pt states she understands diagnosis and is in agreement with discharge.   Glade Nurse, PA-C 12/29/12 2259

## 2012-12-29 NOTE — ED Notes (Signed)
Pt here per [facility] from PCP.  Pt was being seen at PCP for chronic abdominal pain that she advises started in 2007.  Staff called EMS due to pt. Having elevated BP.  PIV started left wrist.  CBG 122

## 2012-12-31 NOTE — ED Provider Notes (Signed)
Medical screening examination/treatment/procedure(s) were performed by non-physician practitioner and as supervising physician I was immediately available for consultation/collaboration.   Richardean Canal, MD 12/31/12 [phone]

## 2014-01-11 ENCOUNTER — Emergency Department (HOSPITAL_COMMUNITY)
Admission: EM | Admit: 2014-01-11 | Discharge: 2014-01-12 | Disposition: A | Discharge status: Home / Self Care | Payer: Medicare Other | Attending: Emergency Medicine | Admitting: Emergency Medicine

## 2014-01-11 ENCOUNTER — Emergency Department (HOSPITAL_COMMUNITY): Payer: Medicare Other

## 2014-01-11 ENCOUNTER — Encounter (HOSPITAL_COMMUNITY): Payer: Self-pay | Admitting: Emergency Medicine

## 2014-01-11 DIAGNOSIS — R45850 Homicidal ideations: Secondary | ICD-10-CM | POA: Insufficient documentation

## 2014-01-11 DIAGNOSIS — Z79899 Other long term (current) drug therapy: Secondary | ICD-10-CM | POA: Insufficient documentation

## 2014-01-11 DIAGNOSIS — F3289 Other specified depressive episodes: Secondary | ICD-10-CM | POA: Insufficient documentation

## 2014-01-11 DIAGNOSIS — I10 Essential (primary) hypertension: Secondary | ICD-10-CM | POA: Insufficient documentation

## 2014-01-11 DIAGNOSIS — R1084 Generalized abdominal pain: Secondary | ICD-10-CM | POA: Insufficient documentation

## 2014-01-11 DIAGNOSIS — Z8719 Personal history of other diseases of the digestive system: Secondary | ICD-10-CM | POA: Insufficient documentation

## 2014-01-11 DIAGNOSIS — IMO0002 Reserved for concepts with insufficient information to code with codable children: Secondary | ICD-10-CM | POA: Insufficient documentation

## 2014-01-11 DIAGNOSIS — G8929 Other chronic pain: Secondary | ICD-10-CM | POA: Insufficient documentation

## 2014-01-11 DIAGNOSIS — F329 Major depressive disorder, single episode, unspecified: Secondary | ICD-10-CM | POA: Insufficient documentation

## 2014-01-11 DIAGNOSIS — F4329 Adjustment disorder with other symptoms: Secondary | ICD-10-CM

## 2014-01-11 LAB — COMPREHENSIVE METABOLIC PANEL
ALT: 24 U/L (ref 0–35)
AST: 25 U/L (ref 0–37)
Albumin: 4 g/dL (ref 3.5–5.2)
Alkaline Phosphatase: 70 U/L (ref 39–117)
BUN: 16 mg/dL (ref 6–23)
CALCIUM: 9.2 mg/dL (ref 8.4–10.5)
CO2: 27 meq/L (ref 19–32)
CREATININE: 0.92 mg/dL (ref 0.50–1.10)
Chloride: 101 mEq/L (ref 96–112)
GFR, EST AFRICAN AMERICAN: 83 mL/min — AB (ref >90)
GFR, EST NON AFRICAN AMERICAN: 72 mL/min — AB (ref >90)
Glucose, Bld: 61 mg/dL — ABNORMAL LOW (ref 70–99)
Potassium: 3.7 mEq/L (ref 3.7–5.3)
Sodium: 142 mEq/L (ref 137–147)
Total Bilirubin: 0.5 mg/dL (ref 0.3–1.2)
Total Protein: 6.9 g/dL (ref 6.0–8.3)

## 2014-01-11 LAB — RAPID URINE DRUG SCREEN, HOSP PERFORMED
Amphetamines: NOT DETECTED
Barbiturates: NOT DETECTED
Benzodiazepines: POSITIVE — AB
COCAINE: NOT DETECTED
OPIATES: POSITIVE — AB
TETRAHYDROCANNABINOL: NOT DETECTED

## 2014-01-11 LAB — URINALYSIS, ROUTINE W REFLEX MICROSCOPIC
BILIRUBIN URINE: NEGATIVE
GLUCOSE, UA: NEGATIVE mg/dL
HGB URINE DIPSTICK: NEGATIVE
Ketones, ur: NEGATIVE mg/dL
Leukocytes, UA: NEGATIVE
Nitrite: NEGATIVE
PH: 7 (ref 5.0–8.0)
Protein, ur: NEGATIVE mg/dL
SPECIFIC GRAVITY, URINE: 1.018 (ref 1.005–1.030)
Urobilinogen, UA: 1 mg/dL (ref 0.0–1.0)

## 2014-01-11 LAB — CBC WITH DIFFERENTIAL/PLATELET
Basophils Absolute: 0.1 10*3/uL (ref 0.0–0.1)
Basophils Relative: 1 % (ref 0–1)
EOS ABS: 0.7 10*3/uL (ref 0.0–0.7)
EOS PCT: 7 % — AB (ref 0–5)
HCT: 33.6 % — ABNORMAL LOW (ref 36.0–46.0)
HEMOGLOBIN: 11.9 g/dL — AB (ref 12.0–15.0)
LYMPHS ABS: 3.6 10*3/uL (ref 0.7–4.0)
LYMPHS PCT: 39 % (ref 12–46)
MCH: 32.7 pg (ref 26.0–34.0)
MCHC: 35.4 g/dL (ref 30.0–36.0)
MCV: 92.3 fL (ref 78.0–100.0)
MONOS PCT: 5 % (ref 3–12)
Monocytes Absolute: 0.5 10*3/uL (ref 0.1–1.0)
Neutro Abs: 4.3 10*3/uL (ref 1.7–7.7)
Neutrophils Relative %: 48 % (ref 43–77)
Platelets: 247 10*3/uL (ref 150–400)
RBC: 3.64 MIL/uL — AB (ref 3.87–5.11)
RDW: 13 % (ref 11.5–15.5)
WBC: 9.1 10*3/uL (ref 4.0–10.5)

## 2014-01-11 LAB — ETHANOL

## 2014-01-11 MED ORDER — LORAZEPAM 2 MG/ML IJ SOLN
1.0000 mg | Freq: Once | INTRAMUSCULAR | Status: AC
  Administered 2014-01-11: 1 mg via INTRAVENOUS
  Filled 2014-01-11: qty 1

## 2014-01-11 MED ORDER — LORAZEPAM 1 MG PO TABS
1.0000 mg | ORAL_TABLET | Freq: Once | ORAL | Status: DC
  Filled 2014-01-11: qty 1

## 2014-01-11 NOTE — ED Notes (Signed)
Pt belongings include:  Blue jeans with flowers Black bra Black T shirt Black and white flip flops   Belongings entered by [place], Mining engineer 1.
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
## 2014-01-11 NOTE — ED Notes (Signed)
Pt has in a big black trash bags:  One five dollar bill, 4 one dollar bills, silver plated earrings, black cell phone, black and white purse, black purse, blue jeans, black and white sandals, black bra, black shirt.
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
## 2014-01-12 MED ORDER — FUROSEMIDE 40 MG PO TABS
40.0000 mg | ORAL_TABLET | Freq: Every day | ORAL | Status: DC
  Filled 2014-01-12: qty 1

## 2014-01-12 MED ORDER — IRBESARTAN 300 MG PO TABS
300.0000 mg | ORAL_TABLET | Freq: Every day | ORAL | Status: DC
  Administered 2014-01-12: 300 mg via ORAL
  Filled 2014-01-12: qty 1

## 2014-01-12 MED ORDER — LISINOPRIL 10 MG PO TABS
10.0000 mg | ORAL_TABLET | Freq: Every day | ORAL | Status: DC
  Administered 2014-01-12: 10 mg via ORAL
  Filled 2014-01-12: qty 1

## 2014-01-12 MED ORDER — HYDROCODONE-ACETAMINOPHEN 5-325 MG PO TABS
2.0000 | ORAL_TABLET | Freq: Four times a day (QID) | ORAL | Status: DC | PRN
  Administered 2014-01-12: 2 via ORAL
  Filled 2014-01-12: qty 2

## 2014-01-12 MED ORDER — LORAZEPAM 1 MG PO TABS: 1.0000 mg | ORAL_TABLET | Freq: Three times a day (TID) | ORAL | Status: DC | PRN

## 2014-01-12 MED ORDER — ALPRAZOLAM 1 MG PO TABS
1.0000 mg | ORAL_TABLET | Freq: Three times a day (TID) | ORAL | Status: DC
  Administered 2014-01-12: 1 mg via ORAL
  Filled 2014-01-12: qty 1

## 2014-01-12 MED ORDER — ZOLPIDEM TARTRATE 5 MG PO TABS: 5.0000 mg | ORAL_TABLET | Freq: Every evening | ORAL | Status: DC | PRN

## 2014-01-12 MED ORDER — ONDANSETRON HCL 4 MG PO TABS: 4.0000 mg | ORAL_TABLET | Freq: Three times a day (TID) | ORAL | Status: DC | PRN

## 2014-01-12 MED ORDER — HYDROCHLOROTHIAZIDE 25 MG PO TABS
25.0000 mg | ORAL_TABLET | Freq: Every day | ORAL | Status: DC
  Administered 2014-01-12: 25 mg via ORAL
  Filled 2014-01-12: qty 1

## 2014-01-12 MED ORDER — ACETAMINOPHEN 325 MG PO TABS: 650.0000 mg | ORAL_TABLET | ORAL | Status: DC | PRN

## 2014-01-12 MED ORDER — NICOTINE 21 MG/24HR TD PT24
21.0000 mg | MEDICATED_PATCH | Freq: Every day | TRANSDERMAL | Status: DC
  Filled 2014-01-12: qty 1

## 2014-01-12 MED ORDER — ALUM & MAG HYDROXIDE-SIMETH 200-200-20 MG/5ML PO SUSP: 30.0000 mL | ORAL | Status: DC | PRN

## 2014-01-12 MED ORDER — LORAZEPAM 1 MG PO TABS
1.0000 mg | ORAL_TABLET | Freq: Three times a day (TID) | ORAL | Status: DC | PRN
  Administered 2014-01-12: 1 mg via ORAL
  Filled 2014-01-12: qty 1

## 2014-01-12 MED ORDER — IBUPROFEN 200 MG PO TABS: 600.0000 mg | ORAL_TABLET | Freq: Three times a day (TID) | ORAL | Status: DC | PRN

## 2014-01-12 NOTE — Consult Note (Signed)
Dorothy Alvarez   Reason for Alvarez:  Reportedly threatening people at [facility] for doing shoddy work Referring Physician:  ER MD  Dorothy Alvarez is an 48 y.o. female. Total Time spent with patient: 45 minutes  Assessment: AXIS I:  Adjustment Disorder with Mixed Emotional Features AXIS II:  Deferred AXIS III:   Past Medical History  Diagnosis Date  . Hypertension   . Depression   . Anxiety   . Chronic abdominal pain   . IBS (irritable bowel syndrome)   . Constipation    AXIS IV:  other psychosocial or environmental problems AXIS V:  61-70 mild symptoms  Plan:  No evidence of imminent risk to self or others at present.    Subjective:   Dorothy Alvarez is a 48 y.o. female patient admitted with reported threats to others.  HPI:  Dorothy Alvarez is known to me from my being her psychiatrist in the past.  She says she is very angry at the [employer] for doing shoddy work on her house.  She has taken them to court as well as reporting them for  Not getting a permit to do the work.  She says she is a [place] and is not going to kill anyone.  She wants to pursue trying to make things work out fairly and needs somebody to help her with the documents as she has a third grade education.  Says she is willing to pay her bills, just wants them not to cheat anybody else. HPI Elements:   Location:  anger for shoddy contractor work. Quality:  making her not sleep well and being unable to relax and let it go. Severity:  as above. Timing:  as above. Duration:  6 months. Context:  as above.  Past Psychiatric History: Past Medical History  Diagnosis Date  . Hypertension   . Depression   . Anxiety   . Chronic abdominal pain   . IBS (irritable bowel syndrome)   . Constipation     reports that she has never smoked. She has never used smokeless tobacco. She reports that she does not drink alcohol or use illicit drugs. Family History  Problem Relation Age of Onset  . Diabetes  Mother   . Heart disease Father    Family History Substance Abuse: No Family Supports: No (None reported ) Living Arrangements: Other relatives (Son lives in the home with pt.) Can pt return to current living arrangement?: Yes Abuse/Neglect [facility]) Physical Abuse: Denies Verbal Abuse: Denies Sexual Abuse: Denies Allergies:   Allergies  Allergen Reactions  . Penicillins Itching    ACT Assessment Complete:  Yes:    Educational Status    Risk to Self: Risk to self Suicidal Ideation: No Suicidal Intent: No Is patient at risk for suicide?: No Suicidal Plan?: No Access to Means: No What has been your use of drugs/alcohol within the last 12 months?: Pt denies  Previous Attempts/Gestures: No How many times?: 0 Other Self Harm Risks: None  Triggers for Past Attempts: None known Intentional Self Injurious Behavior: None Family Suicide History: No Recent stressful life event(s): Conflict (Comment);Legal Issues (Legal issues with [employer] for home improvement servi) Persecutory voices/beliefs?: No Depression: Yes Depression Symptoms: Feeling angry/irritable;Tearfulness;Isolating;Insomnia;Despondent (Hopelessness) Substance abuse history and/or treatment for substance abuse?: No Suicide prevention information given to non-admitted patients: Not applicable  Risk to Others: Risk to Others Homicidal Ideation: Yes-Currently Present Thoughts of Harm to Others: Yes-Currently Present Comment - Thoughts of Harm to Others: Thoughts to harm  [employer] workers  Current Homicidal Intent: No Current Homicidal Plan: No Access to Homicidal Means: No Identified Victim: Animal nutritionist workers  History of harm to others?: No Assessment of Violence: None Noted Violent Behavior Description: None  Does patient have access to weapons?: No Criminal Charges Pending?: No Does patient have a court date: No  Abuse: Abuse/Neglect Assessment (Assessment to be complete while patient is  alone) Physical Abuse: Denies Verbal Abuse: Denies Sexual Abuse: Denies Exploitation of patient/patient's resources: Denies Self-Neglect: Denies  Prior Inpatient Therapy: Prior Inpatient Therapy Prior Inpatient Therapy: Yes Prior Therapy Dates: 2003,2006 Prior Therapy Facilty/Provider(s): [facility]  Reason for Treatment: Mental IIlness  Prior Outpatient Therapy: Prior Outpatient Therapy Prior Outpatient Therapy: Yes Prior Therapy Dates: Current  Prior Therapy Facilty/Provider(s): Dr. Jule Economy. [facility] Reason for Treatment: Med Mgt/Therapy   Additional Information: Additional Information 1:1 In Past 12 Months?: No CIRT Risk: No Elopement Risk: No Does patient have medical clearance?: Yes                  Objective: Blood pressure 138/86, pulse 70, temperature 97.4 F (36.3 C), temperature source Oral, resp. rate 18, SpO2 99.00%.There is no weight on file to calculate BMI. Results for orders placed during the hospital encounter of 01/11/14 (from the past 72 hour(s))  COMPREHENSIVE METABOLIC PANEL     Status: Abnormal   Collection Time    01/11/14  9:20 PM      Result Value Ref Range   Sodium 142  137 - 147 mEq/L   Potassium 3.7  3.7 - 5.3 mEq/L   Comment: SLIGHT HEMOLYSIS     HEMOLYSIS AT THIS LEVEL MAY AFFECT RESULT   Chloride 101  96 - 112 mEq/L   CO2 27  19 - 32 mEq/L   Glucose, Bld 61 (*) 70 - 99 mg/dL   BUN 16  6 - 23 mg/dL   Creatinine, Ser 0.92  0.50 - 1.10 mg/dL   Calcium 9.2  8.4 - 10.5 mg/dL   Total Protein 6.9  6.0 - 8.3 g/dL   Albumin 4.0  3.5 - 5.2 g/dL   AST 25  0 - 37 U/L   Comment: SLIGHT HEMOLYSIS     HEMOLYSIS AT THIS LEVEL MAY AFFECT RESULT   ALT 24  0 - 35 U/L   Comment: SLIGHT HEMOLYSIS     HEMOLYSIS AT THIS LEVEL MAY AFFECT RESULT   Alkaline Phosphatase 70  39 - 117 U/L   Total Bilirubin 0.5  0.3 - 1.2 mg/dL   GFR calc non Af Amer 72 (*) >90 mL/min   GFR calc Af Amer 83 (*) >90 mL/min   Comment: (NOTE)     The eGFR has been  calculated using the CKD EPI equation.     This calculation has not been validated in all clinical situations.     eGFR's persistently <90 mL/min signify possible Chronic Kidney     Disease.  ETHANOL     Status: None   Collection Time    01/11/14  9:20 PM      Result Value Ref Range   Alcohol, Ethyl (B) <11  0 - 11 mg/dL   Comment:            LOWEST DETECTABLE LIMIT FOR     SERUM ALCOHOL IS 11 mg/dL     FOR MEDICAL PURPOSES ONLY  URINE RAPID DRUG SCREEN (HOSP PERFORMED)     Status: Abnormal   Collection Time    01/11/14 10:04 PM  Result Value Ref Range   Opiates POSITIVE (*) NONE DETECTED   Cocaine NONE DETECTED  NONE DETECTED   Benzodiazepines POSITIVE (*) NONE DETECTED   Amphetamines NONE DETECTED  NONE DETECTED   Tetrahydrocannabinol NONE DETECTED  NONE DETECTED   Barbiturates NONE DETECTED  NONE DETECTED   Comment:            DRUG SCREEN FOR MEDICAL PURPOSES     ONLY.  IF CONFIRMATION IS NEEDED     FOR ANY PURPOSE, NOTIFY LAB     WITHIN 5 DAYS.                LOWEST DETECTABLE LIMITS     FOR URINE DRUG SCREEN     Drug Class       Cutoff (ng/mL)     Amphetamine      1000     Barbiturate      200     Benzodiazepine   510     Tricyclics       258     Opiates          300     Cocaine          300     THC              50  URINALYSIS, ROUTINE W REFLEX MICROSCOPIC     Status: None   Collection Time    01/11/14 10:04 PM      Result Value Ref Range   Color, Urine YELLOW  YELLOW   APPearance CLEAR  CLEAR   Specific Gravity, Urine 1.018  1.005 - 1.030   pH 7.0  5.0 - 8.0   Glucose, UA NEGATIVE  NEGATIVE mg/dL   Hgb urine dipstick NEGATIVE  NEGATIVE   Bilirubin Urine NEGATIVE  NEGATIVE   Ketones, ur NEGATIVE  NEGATIVE mg/dL   Protein, ur NEGATIVE  NEGATIVE mg/dL   Urobilinogen, UA 1.0  0.0 - 1.0 mg/dL   Nitrite NEGATIVE  NEGATIVE   Leukocytes, UA NEGATIVE  NEGATIVE   Comment: MICROSCOPIC NOT DONE ON URINES WITH NEGATIVE PROTEIN, BLOOD, LEUKOCYTES, NITRITE, OR  GLUCOSE <1000 mg/dL.  CBC WITH DIFFERENTIAL     Status: Abnormal   Collection Time    01/11/14 10:45 PM      Result Value Ref Range   WBC 9.1  4.0 - 10.5 K/uL   RBC 3.64 (*) 3.87 - 5.11 MIL/uL   Hemoglobin 11.9 (*) 12.0 - 15.0 g/dL   HCT 33.6 (*) 36.0 - 46.0 %   MCV 92.3  78.0 - 100.0 fL   MCH 32.7  26.0 - 34.0 pg   MCHC 35.4  30.0 - 36.0 g/dL   RDW 13.0  11.5 - 15.5 %   Platelets 247  150 - 400 K/uL   Neutrophils Relative % 48  43 - 77 %   Neutro Abs 4.3  1.7 - 7.7 K/uL   Lymphocytes Relative 39  12 - 46 %   Lymphs Abs 3.6  0.7 - 4.0 K/uL   Monocytes Relative 5  3 - 12 %   Monocytes Absolute 0.5  0.1 - 1.0 K/uL   Eosinophils Relative 7 (*) 0 - 5 %   Eosinophils Absolute 0.7  0.0 - 0.7 K/uL   Basophils Relative 1  0 - 1 %   Basophils Absolute 0.1  0.0 - 0.1 K/uL   Labs are reviewed and are pertinent for no psychiatric issues.  Current Facility-Administered Medications  Medication Dose Route  Frequency Provider Last Rate Last Dose  . acetaminophen (TYLENOL) tablet 650 mg  650 mg Oral Q4H PRN Clayton Bibles, PA-C      . ALPRAZolam Duanne Moron) tablet 1 mg  1 mg Oral TID Clayton Bibles, PA-C   1 mg at 01/12/14 4403  . alum & mag hydroxide-simeth (MAALOX/MYLANTA) 200-200-20 MG/5ML suspension 30 mL  30 mL Oral PRN Clayton Bibles, PA-C      . furosemide (LASIX) tablet 40 mg  40 mg Oral Daily Emily West, PA-C      . hydrochlorothiazide (HYDRODIURIL) tablet 25 mg  25 mg Oral Daily Congress, PA-C   25 mg at 01/12/14 0943  . HYDROcodone-acetaminophen (NORCO/VICODIN) 5-325 MG per tablet 2 tablet  2 tablet Oral QID PRN Clayton Bibles, PA-C   2 tablet at 01/12/14 0244  . ibuprofen (ADVIL,MOTRIN) tablet 600 mg  600 mg Oral Q8H PRN Clayton Bibles, PA-C      . irbesartan (AVAPRO) tablet 300 mg  300 mg Oral Daily Bedford, PA-C   300 mg at 01/12/14 4742  . lisinopril (PRINIVIL,ZESTRIL) tablet 10 mg  10 mg Oral Daily Hoffman, PA-C   10 mg at 01/12/14 5956  . LORazepam (ATIVAN) tablet 1 mg  1 mg Oral Q8H PRN Clayton Bibles, PA-C   1 mg at 01/12/14 0244  . nicotine (NICODERM CQ - dosed in mg/24 hours) patch 21 mg  21 mg Transdermal Daily Emily West, PA-C      . ondansetron [facility]) tablet 4 mg  4 mg Oral Q8H PRN Clayton Bibles, PA-C      . zolpidem (AMBIEN) tablet 5 mg  5 mg Oral QHS PRN Clayton Bibles, PA-C      . zolpidem (AMBIEN) tablet 5 mg  5 mg Oral QHS PRN,MR X 1 Clayton Bibles, PA-C       Current Outpatient Prescriptions  Medication Sig Dispense Refill  . ALPRAZolam (XANAX) 1 MG tablet Take 1 mg by mouth 3 (three) times daily.      Marland Kitchen BENICAR 40 MG tablet Take 1 tablet by mouth daily.      . DiphenhydrAMINE HCl (BENADRYL ALLERGY PO) Take 2 tablets by mouth daily as needed (Sinus allergies).      . furosemide (LASIX) 20 MG tablet Take 40 mg by mouth daily.       . hydrochlorothiazide (HYDRODIURIL) 25 MG tablet Take 1 tablet (25 mg total) by mouth daily.  30 tablet  1  . HYDROcodone-ibuprofen (VICOPROFEN) 7.5-200 MG per tablet Take 2 tablets by mouth 4 (four) times daily as needed for pain.      Marland Kitchen lisinopril (PRINIVIL,ZESTRIL) 10 MG tablet Take 1 tablet (10 mg total) by mouth daily.  30 tablet  1  . zolpidem (AMBIEN CR) 12.5 MG CR tablet Take 12.5 mg by mouth at bedtime as needed for sleep.        Psychiatric Specialty Exam:     Blood pressure 138/86, pulse 70, temperature 97.4 F (36.3 C), temperature source Oral, resp. rate 18, SpO2 99.00%.There is no weight on file to calculate BMI.  General Appearance: Well Groomed  Engineer, water::  Good  Speech:  Clear and Coherent  Volume:  Normal  Mood:  Angry  Affect:  Appropriate  Thought Process:  Coherent and Logical  Orientation:  Full (Time, Place, and Person)  Thought Content:  Negative  Suicidal Thoughts:  No  Homicidal Thoughts:  No  Memory:  Immediate;   Good Recent;   Good Remote;   Good  Judgement:  Good  Insight:  Fair  Psychomotor Activity:  Normal  Concentration:  Good  Recall:  Good  Fund of Knowledge:Good  Language: Good  Akathisia:   Negative  Handed:  Right  AIMS (if indicated):     Assets:  Communication Skills Desire for Improvement Financial Resources/Insurance [place] Talents/Skills Transportation Vocational/Educational  Sleep:      Musculoskeletal: Strength & Muscle Tone: within normal limits Gait & Station: normal Patient leans: N/A  Treatment Plan Summary: not suicidal or homicidal so discharge home to be followed outpatient  Clarene Reamer 01/12/2014 11:12 AM

## 2014-01-12 NOTE — BH Assessment (Signed)
Tele Assessment Note   Dorothy Alvarez is a 48 y.o. female who presents via IVC petition, initiated by mobile crisis.  This Probation officer spoke briefly to pt regarding he current mental status. Pt denies SI/AVH and states called mobile crisis after speaking with a family member who encouraged her to talk with someone about her emotional state.  Pt admits that she told mobile crisis that she was having HI thoughts towards staff members at [employer] because they recently completed a home improvement project with her home and she is not satisfied.  She is currently in litigation with the company because of the amount of money she has paid them for a poorly performed renovation. Pt adamantly denies plan or intent to harm staff member(s).   Per mobile crisis: pt reports increased stress due to caring for her son who lives with and recently had knee surgery.  He is also ID/D and legal problems with [employer] water quality company whom she hired to renovate her home.  Pt states she not sleeping and is having nightmares because of stress level, decreased appetite but is gaining weight because of non compliant with medication.  She is not taking her psych medication and is not seeing her psych/therapist for support and med mgt.  Pt reports thoughts of revenge with increasing anger because she feels she has been taken advantage of and disrespected(feels that company ridiculed her because she has a 3rd grade education and doesn't read very well.     Axis I: Unspecified depressive disorder Axis II: Deferred Axis III:  Past Medical History  Diagnosis Date  . Hypertension   . Depression   . Anxiety   . Chronic abdominal pain   . IBS (irritable bowel syndrome)   . Constipation    Axis IV: other psychosocial or environmental problems, problems related to legal system/crime, problems related to social environment and problems with primary support group Axis V: 41-50 serious symptoms  Past Medical History:   Past Medical History  Diagnosis Date  . Hypertension   . Depression   . Anxiety   . Chronic abdominal pain   . IBS (irritable bowel syndrome)   . Constipation     Past Surgical History  Procedure Laterality Date  . Abdominal hysterectomy      Family History:  Family History  Problem Relation Age of Onset  . Diabetes Mother   . Heart disease Father     Social History:  reports that she has never smoked. She has never used smokeless tobacco. She reports that she does not drink alcohol or use illicit drugs.  Additional Social History:  Alcohol / Drug Use Pain Medications: See MAR  Prescriptions: See MAR  Over the Counter: See MAR  History of alcohol / drug use?: No history of alcohol / drug abuse Longest period of sobriety (when/how long): None   CIWA: CIWA-Ar BP: 141/71 mmHg Pulse Rate: 70 COWS:    Allergies:  Allergies  Allergen Reactions  . Penicillins Itching    Home Medications:  (Not in a hospital admission)  OB/GYN Status:  No LMP recorded. Patient has had a hysterectomy.  General Assessment Data Location of Assessment: [facility] Is this a Tele or Face-to-Face Assessment?: Face-to-Face Is this an Initial Assessment or a Re-assessment for this encounter?: Initial Assessment Living Arrangements: Other relatives (Son lives in the home with pt.) Can pt return to current living arrangement?: Yes Admission Status: Involuntary Is patient capable of signing voluntary admission?: No Transfer from: [facility] Referral  Source: MD  Medical Screening Exam ([place]) Medical Exam completed: No Reason for MSE not completed: Other: (None )  [facility] Crisis Care Plan Living Arrangements: Other relatives (Son lives in the home with pt.) Name of Psychiatrist: Dr. Corena Pilgrim  Name of Therapist: Dr. [facility]   Education Status Is patient currently in school?: No Current Grade: None  Highest grade of school patient has  completed: 3rd grade  Name of school: None  Contact person: None   Risk to self Suicidal Ideation: No Suicidal Intent: No Is patient at risk for suicide?: No Suicidal Plan?: No Access to Means: No What has been your use of drugs/alcohol within the last 12 months?: Pt denies  Previous Attempts/Gestures: No How many times?: 0 Other Self Harm Risks: None  Triggers for Past Attempts: None known Intentional Self Injurious Behavior: None Family Suicide History: No Recent stressful life event(s): Conflict (Comment);Legal Issues (Legal issues with [employer] for home improvement servi) Persecutory voices/beliefs?: No Depression: Yes Depression Symptoms: Feeling angry/irritable;Tearfulness;Isolating;Insomnia;Despondent (Hopelessness) Substance abuse history and/or treatment for substance abuse?: No Suicide prevention information given to non-admitted patients: Not applicable  Risk to Others Homicidal Ideation: Yes-Currently Present Thoughts of Harm to Others: Yes-Currently Present Comment - Thoughts of Harm to Others: Thoughts to harm Animal nutritionist workers  Current Homicidal Intent: No Current Homicidal Plan: No Access to Homicidal Means: No Identified Victim: Animal nutritionist workers  History of harm to others?: No Assessment of Violence: None Noted Violent Behavior Description: None  Does patient have access to weapons?: No Criminal Charges Pending?: No Does patient have a court date: No  Psychosis Hallucinations: None noted Delusions: None noted  Mental Status Report Appear/Hygiene: In scrubs Eye Contact: Good Motor Activity: Unremarkable Speech: Loud;Logical/coherent;Pressured Level of Consciousness: Alert Mood: Anxious;Angry Affect: Appropriate to circumstance Anxiety Level: Minimal Thought Processes: Coherent;Relevant Judgement: Unimpaired Orientation: Person;Place;Time;Situation Obsessive Compulsive Thoughts/Behaviors: None  Cognitive  Functioning Concentration: Normal Memory: Recent Intact;Remote Intact IQ: Average Insight: Fair Impulse Control: Fair Appetite: Good Weight Loss: 0 Weight Gain: 0 Sleep: Decreased Total Hours of Sleep: 5 Vegetative Symptoms: None  ADLScreening [facility] Assessment Services) Patient's cognitive ability adequate to safely complete daily activities?: Yes Patient able to express need for assistance with ADLs?: Yes Independently performs ADLs?: Yes (appropriate for developmental age)  Prior Inpatient Therapy Prior Inpatient Therapy: Yes Prior Therapy Dates: 2003,2006 Prior Therapy Facilty/Provider(s): [facility]  Reason for Treatment: Mental IIlness  Prior Outpatient Therapy Prior Outpatient Therapy: Yes Prior Therapy Dates: Current  Prior Therapy Facilty/Provider(s): Dr. Jule Economy. [facility] Reason for Treatment: Med Mgt/Therapy   ADL Screening (condition at time of admission) Patient's cognitive ability adequate to safely complete daily activities?: Yes Is the patient deaf or have difficulty hearing?: No Does the patient have difficulty seeing, even when wearing glasses/contacts?: No Does the patient have difficulty concentrating, remembering, or making decisions?: No Patient able to express need for assistance with ADLs?: Yes Does the patient have difficulty dressing or bathing?: No Independently performs ADLs?: Yes (appropriate for developmental age) Does the patient have difficulty walking or climbing stairs?: No Weakness of Legs: None Weakness of Arms/Hands: None  Home Assistive Devices/Equipment Home Assistive Devices/Equipment: None  Therapy Consults (therapy consults require a physician order) PT Evaluation Needed: No OT Evalulation Needed: No SLP Evaluation Needed: No Abuse/Neglect Assessment (Assessment to be complete while patient is alone) Physical Abuse: Denies Verbal Abuse: Denies Sexual Abuse: Denies Exploitation of patient/patient's resources:  Denies Self-Neglect: Denies Values / Beliefs Cultural Requests During Hospitalization: None  Spiritual Requests During Hospitalization: None Consults Spiritual Care Consult Needed: No Social Work Consult Needed: No Regulatory affairs officer (For Healthcare) Advance Directive: Patient does not have advance directive;Patient would not like information Pre-existing out of facility DNR order (yellow form or pink MOST form): No Nutrition Screen- [facility] Adult/[facility] Patient's home diet: Regular  Additional Information 1:1 In Past 12 Months?: No CIRT Risk: No Elopement Risk: No Does patient have medical clearance?: Yes     Disposition:  Disposition Initial Assessment Completed for this Encounter: Yes Disposition of Patient: Referred to (AM psych eval for final disposition ) Patient referred to: Other (Comment) (AM psych eval for final disposition )  Girtha Rm 01/12/2014 6:21 AM

## 2014-01-12 NOTE — ED Provider Notes (Signed)
Medical screening examination/treatment/procedure(s) were conducted as a shared visit with non-physician practitioner(s) and myself.  I personally evaluated the patient during the encounter.   EKG Interpretation   Date/Time:  Wednesday Jan 11 2014 21:19:07 EDT Ventricular Rate:  77 PR Interval:  146 QRS Duration: 82 QT Interval:  412 QTC Calculation: 466 R Axis:   -15 Text Interpretation:  Sinus rhythm Borderline left axis deviation  Borderline low voltage, extremity leads Confirmed by Jeneen Rinks  MD, [place]  [phone]) on 01/12/2014 12:33:18 AM      Patient examined face-to-face. First opinion written. Agree with the IVC. Patient describes homicidal ideations. Normal physical examination. Intermittently agitated.   EKG Interpretation   Date/Time:  Wednesday Jan 11 2014 21:19:07 EDT Ventricular Rate:  77 PR Interval:  146 QRS Duration: 82 QT Interval:  412 QTC Calculation: 466 R Axis:   -15 Text Interpretation:  Sinus rhythm Borderline left axis deviation  Borderline low voltage, extremity leads Confirmed by Jeneen Rinks  MD, [place]  [phone]) on 01/12/2014 12:33:18 AM        Tanna Furry, MD 01/12/14 1558

## 2014-01-12 NOTE — [facility] Suicide Risk Assessment (Signed)
Suicide Risk Assessment  Discharge Assessment     Demographic Factors:  Low socioeconomic status  Total Time spent with patient: 45 minutes  Psychiatric Specialty Exam:     Blood pressure 138/86, pulse 70, temperature 97.4 F (36.3 C), temperature source Oral, resp. rate 18, SpO2 99.00%.There is no weight on file to calculate BMI.  General Appearance: Well Groomed  Engineer, water::  Good  Speech:  Clear and Coherent  Volume:  Normal  Mood:  Angry  Affect:  Appropriate  Thought Process:  Coherent  Orientation:  Full (Time, Place, and Person)  Thought Content:  Negative  Suicidal Thoughts:  No  Homicidal Thoughts:  No  Memory:  Immediate;   Good Recent;   Good Remote;   Good  Judgement:  Intact  Insight:  Good  Psychomotor Activity:  Normal  Concentration:  Good  Recall:  Good  Fund of Knowledge:Good  Language: Good  Akathisia:  Negative  Handed:  Right  AIMS (if indicated):     Assets:  Communication Skills Desire for Improvement Financial Resources/Insurance Housing Leisure Time Physical Health Social Support Talents/Skills Transportation Vocational/Educational  Sleep:       Musculoskeletal: Strength & Muscle Tone: within normal limits Gait & Station: normal Patient leans: NA   Mental Status Per Nursing Assessment::   On Admission:     Current Mental Status by Physician: NA  Loss Factors: NA  Historical Factors: NA  Risk Reduction Factors:   Sense of responsibility to family, Religious beliefs about death, Positive social support, Positive therapeutic relationship and Positive coping skills or problem solving skills  Continued Clinical Symptoms:  Severe Anxiety and/or Agitation  Cognitive Features That Contribute To Risk:  Closed-mindedness    Suicide Risk:  Minimal: No identifiable suicidal ideation.  Patients presenting with no risk factors but with morbid ruminations; may be classified as minimal risk based on the severity of the  depressive symptoms  Discharge Diagnoses:   AXIS I:  Adjustment Disorder with Mixed Emotional Features AXIS II:  Deferred AXIS III:   Past Medical History  Diagnosis Date  . Hypertension   . Depression   . Anxiety   . Chronic abdominal pain   . IBS (irritable bowel syndrome)   . Constipation    AXIS IV:  other psychosocial or environmental problems AXIS V:  61-70 mild symptoms  Plan Of Care/Follow-up recommendations:  Activity:  resume usual activity Diet:  resume usual diet  Is patient on multiple antipsychotic therapies at discharge:  No   Has Patient had three or more failed trials of antipsychotic monotherapy by history:  No  Recommended Plan for Multiple Antipsychotic Therapies: NA    Dorothy Alvarez 01/12/2014, 11:41 AM

## 2014-01-12 NOTE — BH Assessment (Signed)
[facility] Assessment Progress Note      Pt was evaluated by Dr Gerald Taylor, ED psychiatrist.  She was cleared for discharge home.  Her IVC was rescinded by Dr Taylor and the notice of commitment change was faxed to the magistrate and the clerk of court for [place] 

## 2014-01-12 NOTE — ED Notes (Signed)
Pt denies chest pain at this time.

## 2014-01-12 NOTE — Consult Note (Signed)
Review of Systems   Constitutional: Negative.    HENT: Negative.    Eyes: Negative.    Respiratory: Negative.    Cardiovascular: Negative.    Gastrointestinal: Negative.    Genitourinary: Negative.    Musculoskeletal: Negative.    Skin: Negative.    Neurological: Negative.    Endo/Heme/Allergies: Negative.    Psychiatric/Behavioral: Negative.

## 2014-01-12 NOTE — Progress Notes (Addendum)
CSW me with pt at bedside, to discuss resources including [facility] for [facility]. Patient states that she will follow up but she needs some type of legal help or she's going to continue to have these thoughts. Patient denies wanting to harm herself or others. TTS providing resources for counseling.   Noreene Larsson [phone]  ED CSW 01/12/2014 1141am

## 2014-02-06 ENCOUNTER — Emergency Department (HOSPITAL_COMMUNITY)
Admission: EM | Admit: 2014-02-06 | Discharge: 2014-02-06 | Discharge status: Against Medical Advice | Payer: Medicare Other | Attending: Emergency Medicine | Admitting: Emergency Medicine

## 2014-02-06 ENCOUNTER — Encounter (HOSPITAL_COMMUNITY): Payer: Self-pay | Admitting: Emergency Medicine

## 2014-02-06 DIAGNOSIS — F3289 Other specified depressive episodes: Secondary | ICD-10-CM | POA: Insufficient documentation

## 2014-02-06 DIAGNOSIS — I10 Essential (primary) hypertension: Secondary | ICD-10-CM | POA: Insufficient documentation

## 2014-02-06 DIAGNOSIS — F329 Major depressive disorder, single episode, unspecified: Secondary | ICD-10-CM | POA: Diagnosis present

## 2014-02-06 DIAGNOSIS — G8929 Other chronic pain: Secondary | ICD-10-CM | POA: Insufficient documentation

## 2014-02-06 LAB — RAPID URINE DRUG SCREEN, HOSP PERFORMED
AMPHETAMINES: NOT DETECTED
Barbiturates: NOT DETECTED
Benzodiazepines: NOT DETECTED
Cocaine: NOT DETECTED
Opiates: POSITIVE — AB
TETRAHYDROCANNABINOL: NOT DETECTED

## 2014-02-06 LAB — COMPREHENSIVE METABOLIC PANEL
ALBUMIN: 4.7 g/dL (ref 3.5–5.2)
ALK PHOS: 68 U/L (ref 39–117)
ALT: 17 U/L (ref 0–35)
AST: 15 U/L (ref 0–37)
BILIRUBIN TOTAL: 0.7 mg/dL (ref 0.3–1.2)
BUN: 8 mg/dL (ref 6–23)
CHLORIDE: 103 meq/L (ref 96–112)
CO2: 26 mEq/L (ref 19–32)
Calcium: 9.5 mg/dL (ref 8.4–10.5)
Creatinine, Ser: 0.89 mg/dL (ref 0.50–1.10)
GFR calc Af Amer: 87 mL/min — ABNORMAL LOW (ref >90)
GFR calc non Af Amer: 75 mL/min — ABNORMAL LOW (ref >90)
Glucose, Bld: 102 mg/dL — ABNORMAL HIGH (ref 70–99)
Potassium: 3.8 mEq/L (ref 3.7–5.3)
SODIUM: 144 meq/L (ref 137–147)
Total Protein: 7.7 g/dL (ref 6.0–8.3)

## 2014-02-06 LAB — CBC
HEMATOCRIT: 41.6 % (ref 36.0–46.0)
Hemoglobin: 14.8 g/dL (ref 12.0–15.0)
MCH: 32.5 pg (ref 26.0–34.0)
MCHC: 35.6 g/dL (ref 30.0–36.0)
MCV: 91.4 fL (ref 78.0–100.0)
PLATELETS: 331 10*3/uL (ref 150–400)
RBC: 4.55 MIL/uL (ref 3.87–5.11)
RDW: 13 % (ref 11.5–15.5)
WBC: 7.7 10*3/uL (ref 4.0–10.5)

## 2014-02-06 LAB — ETHANOL: Alcohol, Ethyl (B): <11 mg/dL (ref 0–11)

## 2014-02-06 NOTE — ED Notes (Signed)
Pt states she feels depressed has felt like this for the past couple weeks but has gotten worse. Pt denies SI/HI.

## 2014-02-06 NOTE — ED Notes (Signed)
Observed pt walking out ED TRIAGE DOORS

## 2014-02-06 NOTE — ED Notes (Signed)
Called for pt with no response. 

## 2014-02-07 ENCOUNTER — Emergency Department (HOSPITAL_COMMUNITY)
Admission: EM | Admit: 2014-02-07 | Discharge: 2014-02-07 | Disposition: A | Discharge status: Home / Self Care | Payer: Medicare Other | Attending: Emergency Medicine | Admitting: Emergency Medicine

## 2014-02-07 ENCOUNTER — Encounter (HOSPITAL_COMMUNITY): Payer: Self-pay | Admitting: Emergency Medicine

## 2014-02-07 DIAGNOSIS — Z880 Allergy status to penicillin: Secondary | ICD-10-CM | POA: Diagnosis not present

## 2014-02-07 DIAGNOSIS — F3289 Other specified depressive episodes: Secondary | ICD-10-CM | POA: Insufficient documentation

## 2014-02-07 DIAGNOSIS — Z79899 Other long term (current) drug therapy: Secondary | ICD-10-CM | POA: Insufficient documentation

## 2014-02-07 DIAGNOSIS — I10 Essential (primary) hypertension: Secondary | ICD-10-CM | POA: Diagnosis not present

## 2014-02-07 DIAGNOSIS — Z8719 Personal history of other diseases of the digestive system: Secondary | ICD-10-CM | POA: Diagnosis not present

## 2014-02-07 DIAGNOSIS — F411 Generalized anxiety disorder: Secondary | ICD-10-CM | POA: Insufficient documentation

## 2014-02-07 DIAGNOSIS — R45851 Suicidal ideations: Secondary | ICD-10-CM | POA: Diagnosis not present

## 2014-02-07 DIAGNOSIS — F329 Major depressive disorder, single episode, unspecified: Secondary | ICD-10-CM | POA: Diagnosis not present

## 2014-02-07 DIAGNOSIS — G8929 Other chronic pain: Secondary | ICD-10-CM | POA: Insufficient documentation

## 2014-02-07 LAB — BASIC METABOLIC PANEL
BUN: 5 mg/dL — ABNORMAL LOW (ref 6–23)
CHLORIDE: 101 meq/L (ref 96–112)
CO2: 28 mEq/L (ref 19–32)
CREATININE: 0.8 mg/dL (ref 0.50–1.10)
Calcium: 9.3 mg/dL (ref 8.4–10.5)
GFR, EST NON AFRICAN AMERICAN: 85 mL/min — AB (ref >90)
Glucose, Bld: 116 mg/dL — ABNORMAL HIGH (ref 70–99)
Potassium: 3.2 mEq/L — ABNORMAL LOW (ref 3.7–5.3)
Sodium: 142 mEq/L (ref 137–147)

## 2014-02-07 LAB — RAPID URINE DRUG SCREEN, HOSP PERFORMED
Amphetamines: NOT DETECTED
BENZODIAZEPINES: NOT DETECTED
Barbiturates: NOT DETECTED
Cocaine: NOT DETECTED
Opiates: NOT DETECTED
TETRAHYDROCANNABINOL: NOT DETECTED

## 2014-02-07 LAB — CBC WITH DIFFERENTIAL/PLATELET
Basophils Absolute: 0 10*3/uL (ref 0.0–0.1)
Basophils Relative: 0 % (ref 0–1)
EOS ABS: 0.1 10*3/uL (ref 0.0–0.7)
EOS PCT: 1 % (ref 0–5)
HEMATOCRIT: 39.7 % (ref 36.0–46.0)
HEMOGLOBIN: 14 g/dL (ref 12.0–15.0)
Lymphocytes Relative: 25 % (ref 12–46)
Lymphs Abs: 2.3 10*3/uL (ref 0.7–4.0)
MCH: 32.2 pg (ref 26.0–34.0)
MCHC: 35.3 g/dL (ref 30.0–36.0)
MCV: 91.3 fL (ref 78.0–100.0)
MONO ABS: 0.6 10*3/uL (ref 0.1–1.0)
MONOS PCT: 6 % (ref 3–12)
Neutro Abs: 6.1 10*3/uL (ref 1.7–7.7)
Neutrophils Relative %: 68 % (ref 43–77)
Platelets: 316 10*3/uL (ref 150–400)
RBC: 4.35 MIL/uL (ref 3.87–5.11)
RDW: 12.9 % (ref 11.5–15.5)
WBC: 9.1 10*3/uL (ref 4.0–10.5)

## 2014-02-07 LAB — URINALYSIS, ROUTINE W REFLEX MICROSCOPIC
Bilirubin Urine: NEGATIVE
Glucose, UA: NEGATIVE mg/dL
Hgb urine dipstick: NEGATIVE
Ketones, ur: NEGATIVE mg/dL
Leukocytes, UA: NEGATIVE
NITRITE: NEGATIVE
PH: 6.5 (ref 5.0–8.0)
Protein, ur: NEGATIVE mg/dL
Specific Gravity, Urine: 1 — ABNORMAL LOW (ref 1.005–1.030)
Urobilinogen, UA: 0.2 mg/dL (ref 0.0–1.0)

## 2014-02-07 LAB — ETHANOL: Alcohol, Ethyl (B): <11 mg/dL (ref 0–11)

## 2014-02-07 MED ORDER — ONDANSETRON HCL 4 MG PO TABS: 4.0000 mg | ORAL_TABLET | Freq: Three times a day (TID) | ORAL | Status: DC | PRN

## 2014-02-07 MED ORDER — IRBESARTAN 300 MG PO TABS
300.0000 mg | ORAL_TABLET | Freq: Every day | ORAL | Status: DC
  Administered 2014-02-07: 300 mg via ORAL
  Filled 2014-02-07 (×2): qty 1

## 2014-02-07 MED ORDER — FUROSEMIDE 20 MG PO TABS
20.0000 mg | ORAL_TABLET | Freq: Every day | ORAL | Status: DC
Start: 2014-02-07 — End: 2014-02-07
  Administered 2014-02-07: 20 mg via ORAL
  Filled 2014-02-07: qty 1
  Filled 2014-02-07: qty 0.5

## 2014-02-07 MED ORDER — ZOLPIDEM TARTRATE 5 MG PO TABS: 5.0000 mg | ORAL_TABLET | Freq: Every evening | ORAL | Status: DC | PRN

## 2014-02-07 MED ORDER — NICOTINE 21 MG/24HR TD PT24: 21.0000 mg | MEDICATED_PATCH | Freq: Every day | TRANSDERMAL | Status: DC | PRN

## 2014-02-07 MED ORDER — ALPRAZOLAM 0.5 MG PO TABS: 0.5000 mg | ORAL_TABLET | Freq: Three times a day (TID) | ORAL | Status: DC | PRN

## 2014-02-07 MED ORDER — LORAZEPAM 1 MG PO TABS
1.0000 mg | ORAL_TABLET | Freq: Three times a day (TID) | ORAL | Status: DC | PRN
  Administered 2014-02-07: 1 mg via ORAL
  Filled 2014-02-07: qty 1

## 2014-02-07 MED ORDER — ACETAMINOPHEN 325 MG PO TABS: 650.0000 mg | ORAL_TABLET | ORAL | Status: DC | PRN

## 2014-02-07 MED ORDER — ALPRAZOLAM 0.5 MG PO TABS
0.5000 mg | ORAL_TABLET | Freq: Three times a day (TID) | ORAL | Status: DC
  Administered 2014-02-07: 0.5 mg via ORAL
  Filled 2014-02-07: qty 1

## 2014-02-07 MED ORDER — ALUM & MAG HYDROXIDE-SIMETH 200-200-20 MG/5ML PO SUSP
30.0000 mL | ORAL | Status: DC | PRN
Start: 2014-02-07 — End: 2014-02-07

## 2014-02-07 MED ORDER — IBUPROFEN 200 MG PO TABS
600.0000 mg | ORAL_TABLET | Freq: Three times a day (TID) | ORAL | Status: DC | PRN
  Administered 2014-02-07: 600 mg via ORAL
  Filled 2014-02-07: qty 3

## 2014-02-07 NOTE — ED Notes (Signed)
PT states that she feels anxious or hyperactive; pt states that she was prescribed Ativan 3 weeks ago for anxiety; pt states that she thinks that the medication is not working and is making it worse; pt arrived via [place]; pt states that she was here early and didn't want to wait so she left and called the ambulance

## 2014-02-07 NOTE — ED Provider Notes (Signed)
CSN: [id number]     Arrival date & time 02/07/14  0349 History   First MD Initiated Contact with Patient 02/07/14 0703     Chief Complaint  Patient presents with  . Anxiety     (Consider location/radiation/quality/duration/timing/severity/associated sxs/prior Treatment) Patient is a 48 y.o. female presenting with anxiety. The history is provided by the patient.  Anxiety   She presents for evaluation of suicidal ideation. He does not have an active plan. She has a burning sensation in her head and had him. She denies audible or visual hallucinations. She is not currently homicidal. She has not seen a psychiatrist since being discharged, from the ER 2 weeks ago. He is here with her daughter. She's been having trouble sleeping for 2 days. She states that she is taking. Her medications, without relief. There are no other modifying factors.  Past Medical History  Diagnosis Date  . Hypertension   . Depression   . Anxiety   . Chronic abdominal pain   . IBS (irritable bowel syndrome)   . Constipation    Past Surgical History  Procedure Laterality Date  . Abdominal hysterectomy     Family History  Problem Relation Age of Onset  . Diabetes Mother   . Heart disease Father    History  Substance Use Topics  . Smoking status: Never Smoker   . Smokeless tobacco: Never Used  . Alcohol Use: No   OB History   Grav Para Term Preterm Abortions TAB SAB Ect Mult Living                 Review of Systems  All other systems reviewed and are negative.     Allergies  Penicillins  Home Medications   Prior to Admission medications   Medication Sig Start Date End Date Taking? Authorizing Provider  BENICAR 40 MG tablet Take 1 tablet by mouth daily. 12/17/13  Yes Historical Provider, MD  DiphenhydrAMINE HCl (BENADRYL ALLERGY PO) Take 2 tablets by mouth daily as needed (Sinus allergies).   Yes Historical Provider, MD  furosemide (LASIX) 20 MG tablet Take 20 mg by mouth daily.    Yes  Historical Provider, MD  HYDROcodone-ibuprofen (VICOPROFEN) 7.5-200 MG per tablet Take 2 tablets by mouth 4 (four) times daily as needed for pain.   Yes Historical Provider, MD  ALPRAZolam Duanne Moron) 0.5 MG tablet Take 1 tablet (0.5 mg total) by mouth 3 (three) times daily as needed for anxiety. 02/07/14   Clarene Reamer, MD  zolpidem (AMBIEN) 5 MG tablet Take 1 tablet (5 mg total) by mouth at bedtime as needed for sleep. 02/07/14   Clarene Reamer, MD   BP 187/111  Pulse 100  Temp(Src) 99 F (37.2 C) (Oral)  Resp 18  SpO2 99% Physical Exam  Nursing note and vitals reviewed. Constitutional: She is oriented to person, place, and time. She appears well-developed and well-nourished.  HENT:  Head: Normocephalic and atraumatic.  Eyes: Conjunctivae and EOM are normal. Pupils are equal, round, and reactive to light.  Neck: Normal range of motion and phonation normal. Neck supple.  Cardiovascular: Normal rate, regular rhythm and intact distal pulses.   Pulmonary/Chest: Effort normal and breath sounds normal. She exhibits no tenderness.  Abdominal: Soft. She exhibits no distension. There is no tenderness. There is no guarding.  Musculoskeletal: Normal range of motion.  Neurological: She is alert and oriented to person, place, and time. She exhibits normal muscle tone.  Skin: Skin is warm and dry.  Psychiatric: She  has a normal mood and affect. Her behavior is normal. Judgment and thought content normal.    ED Course  Procedures (including critical care time) Medications - No data to display  Patient Vitals for the past 24 hrs:  BP Temp Temp src Pulse Resp SpO2  02/07/14 1230 187/111 mmHg 99 F (37.2 C) Oral 100 18 99 %  02/07/14 0354 163/119 mmHg 98 F (36.7 C) Oral 106 20 96 %    0715- cleared for transfer to the psychiatric unit for evaluation by psychiatry. Routine labs ordered.    Labs Review Labs Reviewed  BASIC METABOLIC PANEL - Abnormal; Notable for the following:    Potassium  3.2 (*)    Glucose, Bld 116 (*)    BUN 5 (*)    GFR calc non Af Amer 85 (*)    All other components within normal limits  URINALYSIS, ROUTINE W REFLEX MICROSCOPIC - Abnormal; Notable for the following:    Specific Gravity, Urine 1.000 (*)    All other components within normal limits  CBC WITH DIFFERENTIAL  ETHANOL  URINE RAPID DRUG SCREEN (HOSP PERFORMED)    Imaging Review No results found.   EKG Interpretation None      MDM   Final diagnoses:  ANXIETY  Generalized anxiety disorder    Suicidal ideation, with anxiety, she was evaluated by psychiatry, and disposition by them    Richarda Blade, MD 02/07/14 1640

## 2014-02-07 NOTE — ED Notes (Signed)
Pt belongings in locker #27  

## 2014-02-07 NOTE — Consult Note (Signed)
  Review of Systems  Constitutional: Negative.   HENT: Negative.   Eyes: Negative.   Respiratory: Negative.   Cardiovascular: Negative.   Gastrointestinal: Negative.   Genitourinary: Negative.   Musculoskeletal: Negative.   Skin: Negative.   Neurological: Negative.   Endo/Heme/Allergies: Negative.   Psychiatric/Behavioral: The patient is nervous/anxious.

## 2014-02-07 NOTE — ED Notes (Signed)
MD at bedside. TTS DID NOT SEE PT AT THIS TIME. DID DISCUSS WOULD SEE PT LATER

## 2014-02-07 NOTE — ED Notes (Signed)
Bed: WA05 Expected date:  Expected time:  Means of arrival:  Comments: 

## 2014-02-07 NOTE — ED Notes (Signed)
Bed: WA27 Expected date:  Expected time:  Means of arrival:  Comments: Room 5

## 2014-02-07 NOTE — Consult Note (Signed)
[facility] Psychiatry Consult   Reason for Consult:  anxiety Referring Physician:  ER MD  Andora Krull is an 48 y.o. female. Total Time spent with patient: 45 minutes  Assessment: AXIS I:  Anxiety Disorder NOS AXIS II:  Deferred AXIS III:   Past Medical History  Diagnosis Date  . Hypertension   . Depression   . Anxiety   . Chronic abdominal pain   . IBS (irritable bowel syndrome)   . Constipation    AXIS IV:  chronic anxiety AXIS V:  51-60 moderate symptoms  Plan:  No evidence of imminent risk to self or others at present.    Subjective:   Cole Klugh is a 48 y.o. female patient admitted with anxiety.  HPI:  Ms Zenovia Jordan says she tried the lorazepam we gave her at her last visit to the ER last week but it made her worse.  The only thing that works for her anxiety is alprazolam and zolpidem, she says.  Otherwise, she feels that she is about to lose it.  She wants to be inpatient but is not suicidal or dangerous to others so there are no criteria for admission.   HPI Elements:   Location:  anxiety. Quality:  about to lose it, she says. Severity:  daily and constant . Timing:  tried Ativan and said it does not help. Duration:  years. Context:  as above.  Past Psychiatric History: Past Medical History  Diagnosis Date  . Hypertension   . Depression   . Anxiety   . Chronic abdominal pain   . IBS (irritable bowel syndrome)   . Constipation     reports that she has never smoked. She has never used smokeless tobacco. She reports that she does not drink alcohol or use illicit drugs. Family History  Problem Relation Age of Onset  . Diabetes Mother   . Heart disease Father            Allergies:   Allergies  Allergen Reactions  . Penicillins Itching    ACT Assessment Complete:  Yes:    Educational Status    Risk to Self: Risk to self Is patient at risk for suicide?: No Substance abuse history and/or treatment for substance abuse?: No  Risk to Others:     Abuse:    Prior Inpatient Therapy:    Prior Outpatient Therapy:    Additional Information:                    Objective: Blood pressure 163/119, pulse 106, temperature 98 F (36.7 C), temperature source Oral, resp. rate 20, SpO2 96.00%.There is no weight on file to calculate BMI. Results for orders placed during the hospital encounter of 02/07/14 (from the past 72 hour(s))  CBC WITH DIFFERENTIAL     Status: None   Collection Time    02/07/14  8:38 AM      Result Value Ref Range   WBC 9.1  4.0 - 10.5 K/uL   RBC 4.35  3.87 - 5.11 MIL/uL   Hemoglobin 14.0  12.0 - 15.0 g/dL   HCT 39.7  36.0 - 46.0 %   MCV 91.3  78.0 - 100.0 fL   MCH 32.2  26.0 - 34.0 pg   MCHC 35.3  30.0 - 36.0 g/dL   RDW 12.9  11.5 - 15.5 %   Platelets 316  150 - 400 K/uL   Neutrophils Relative % 68  43 - 77 %   Neutro Abs 6.1  1.7 - 7.7 K/uL   Lymphocytes Relative 25  12 - 46 %   Lymphs Abs 2.3  0.7 - 4.0 K/uL   Monocytes Relative 6  3 - 12 %   Monocytes Absolute 0.6  0.1 - 1.0 K/uL   Eosinophils Relative 1  0 - 5 %   Eosinophils Absolute 0.1  0.0 - 0.7 K/uL   Basophils Relative 0  0 - 1 %   Basophils Absolute 0.0  0.0 - 0.1 K/uL  BASIC METABOLIC PANEL     Status: Abnormal   Collection Time    02/07/14  8:38 AM      Result Value Ref Range   Sodium 142  137 - 147 mEq/L   Potassium 3.2 (*) 3.7 - 5.3 mEq/L   Chloride 101  96 - 112 mEq/L   CO2 28  19 - 32 mEq/L   Glucose, Bld 116 (*) 70 - 99 mg/dL   BUN 5 (*) 6 - 23 mg/dL   Creatinine, Ser 0.80  0.50 - 1.10 mg/dL   Calcium 9.3  8.4 - 10.5 mg/dL   GFR calc non Af Amer 85 (*) >90 mL/min   GFR calc Af Amer >90  >90 mL/min   Comment: (NOTE)     The eGFR has been calculated using the CKD EPI equation.     This calculation has not been validated in all clinical situations.     eGFR's persistently <90 mL/min signify possible Chronic Kidney     Disease.  ETHANOL     Status: None   Collection Time    02/07/14  8:38 AM      Result Value Ref  Range   Alcohol, Ethyl (B) <11  0 - 11 mg/dL   Comment:            LOWEST DETECTABLE LIMIT FOR     SERUM ALCOHOL IS 11 mg/dL     FOR MEDICAL PURPOSES ONLY  URINALYSIS, ROUTINE W REFLEX MICROSCOPIC     Status: Abnormal   Collection Time    02/07/14  8:45 AM      Result Value Ref Range   Color, Urine YELLOW  YELLOW   APPearance CLEAR  CLEAR   Specific Gravity, Urine 1.000 (*) 1.005 - 1.030   pH 6.5  5.0 - 8.0   Glucose, UA NEGATIVE  NEGATIVE mg/dL   Hgb urine dipstick NEGATIVE  NEGATIVE   Bilirubin Urine NEGATIVE  NEGATIVE   Ketones, ur NEGATIVE  NEGATIVE mg/dL   Protein, ur NEGATIVE  NEGATIVE mg/dL   Urobilinogen, UA 0.2  0.0 - 1.0 mg/dL   Nitrite NEGATIVE  NEGATIVE   Leukocytes, UA NEGATIVE  NEGATIVE   Comment: MICROSCOPIC NOT DONE ON URINES WITH NEGATIVE PROTEIN, BLOOD, LEUKOCYTES, NITRITE, OR GLUCOSE <1000 mg/dL.  URINE RAPID DRUG SCREEN (HOSP PERFORMED)     Status: None   Collection Time    02/07/14  8:45 AM      Result Value Ref Range   Opiates NONE DETECTED  NONE DETECTED   Comment: REPEATED TO VERIFY     DELTA CHECK NOTED   Cocaine NONE DETECTED  NONE DETECTED   Benzodiazepines NONE DETECTED  NONE DETECTED   Amphetamines NONE DETECTED  NONE DETECTED   Tetrahydrocannabinol NONE DETECTED  NONE DETECTED   Barbiturates NONE DETECTED  NONE DETECTED   Comment:            DRUG SCREEN FOR MEDICAL PURPOSES     ONLY.  IF CONFIRMATION IS NEEDED  FOR ANY PURPOSE, NOTIFY LAB     WITHIN 5 DAYS.                LOWEST DETECTABLE LIMITS     FOR URINE DRUG SCREEN     Drug Class       Cutoff (ng/mL)     Amphetamine      1000     Barbiturate      200     Benzodiazepine   992     Tricyclics       426     Opiates          300     Cocaine          300     THC              50   Labs are reviewed and are pertinent for no psychiatric issue.  Current Facility-Administered Medications  Medication Dose Route Frequency Provider Last Rate Last Dose  . acetaminophen (TYLENOL) tablet  650 mg  650 mg Oral Q4H PRN Richarda Blade, MD      . ALPRAZolam Duanne Moron) tablet 0.5 mg  0.5 mg Oral TID Clarene Reamer, MD      . alum & mag hydroxide-simeth (MAALOX/MYLANTA) 200-200-20 MG/5ML suspension 30 mL  30 mL Oral PRN Richarda Blade, MD      . furosemide (LASIX) tablet 20 mg  20 mg Oral Daily Angela Adam, RPH      . ibuprofen (ADVIL,MOTRIN) tablet 600 mg  600 mg Oral Q8H PRN Richarda Blade, MD      . irbesartan (AVAPRO) tablet 300 mg  300 mg Oral Daily Richarda Blade, MD      . LORazepam (ATIVAN) tablet 1 mg  1 mg Oral Q8H PRN Richarda Blade, MD      . nicotine (NICODERM CQ - dosed in mg/24 hours) patch 21 mg  21 mg Transdermal Daily PRN Richarda Blade, MD      . ondansetron [facility]) tablet 4 mg  4 mg Oral Q8H PRN Richarda Blade, MD      . zolpidem (AMBIEN) tablet 5 mg  5 mg Oral QHS PRN Richarda Blade, MD       Current Outpatient Prescriptions  Medication Sig Dispense Refill  . BENICAR 40 MG tablet Take 1 tablet by mouth daily.      . DiphenhydrAMINE HCl (BENADRYL ALLERGY PO) Take 2 tablets by mouth daily as needed (Sinus allergies).      . furosemide (LASIX) 20 MG tablet Take 20 mg by mouth daily.       Marland Kitchen HYDROcodone-ibuprofen (VICOPROFEN) 7.5-200 MG per tablet Take 2 tablets by mouth 4 (four) times daily as needed for pain.      Marland Kitchen LORazepam (ATIVAN) 1 MG tablet Take 1 tablet (1 mg total) by mouth every 8 (eight) hours as needed for anxiety (agitation).  30 tablet  0  . zolpidem (AMBIEN CR) 12.5 MG CR tablet Take 12.5 mg by mouth at bedtime as needed for sleep.        Psychiatric Specialty Exam:     Blood pressure 163/119, pulse 106, temperature 98 F (36.7 C), temperature source Oral, resp. rate 20, SpO2 96.00%.There is no weight on file to calculate BMI.  General Appearance: Casual  Eye Contact::  Good  Speech:  Clear and Coherent  Volume:  Normal  Mood:  Anxious  Affect:  Congruent  Thought Process:  Coherent  Orientation:  Full (Time, Place, and Person)   Thought Content:  Negative  Suicidal Thoughts:  No  Homicidal Thoughts:  No  Memory:  Immediate;   Good Recent;   Good Remote;   Good  Judgement:  Good  Insight:  Fair  Psychomotor Activity:  Normal  Concentration:  Good  Recall:  Good  Fund of Knowledge:Good  Language: Good  Akathisia:  Negative  Handed:  Right  AIMS (if indicated):     Assets:  Communication Skills Housing  Sleep:      Musculoskeletal: Strength & Muscle Tone: within normal limits Gait & Station: normal Patient leans: N/A  Treatment Plan Summary: discharge home with alprazolam and zolpidem  TAYLOR,GERALD D 02/07/2014 10:34 AM

## 2014-02-07 NOTE — ED Notes (Signed)
Pt speaking with [place] - Pt and family present. Charge Faith aware of delay. AD NIcole aware of delay

## 2014-02-07 NOTE — ED Notes (Signed)
Lovena Le MD states ok to give both ativan and xanax

## 2014-03-09 ENCOUNTER — Ambulatory Visit (HOSPITAL_COMMUNITY): Payer: Medicare Other | Admitting: Psychiatry

## 2014-04-13 ENCOUNTER — Encounter (HOSPITAL_COMMUNITY): Payer: Self-pay | Admitting: Psychiatry

## 2014-04-13 ENCOUNTER — Ambulatory Visit (INDEPENDENT_AMBULATORY_CARE_PROVIDER_SITE_OTHER): Payer: Medicare Other | Admitting: Psychiatry

## 2014-04-13 VITALS — BP 114/83 | HR 96 | Ht 65.0 in | Wt 214.6 lb

## 2014-04-13 DIAGNOSIS — G4700 Insomnia, unspecified: Secondary | ICD-10-CM | POA: Insufficient documentation

## 2014-04-13 DIAGNOSIS — F411 Generalized anxiety disorder: Secondary | ICD-10-CM

## 2014-04-13 DIAGNOSIS — F329 Major depressive disorder, single episode, unspecified: Secondary | ICD-10-CM

## 2014-04-13 DIAGNOSIS — F331 Major depressive disorder, recurrent, moderate: Secondary | ICD-10-CM | POA: Insufficient documentation

## 2014-04-13 MED ORDER — ALPRAZOLAM 0.5 MG PO TABS: 0.5000 mg | ORAL_TABLET | Freq: Three times a day (TID) | ORAL | Status: DC | PRN

## 2014-04-13 MED ORDER — ZOLPIDEM TARTRATE ER 6.25 MG PO TBCR: 6.2500 mg | EXTENDED_RELEASE_TABLET | Freq: Every evening | ORAL | Status: DC | PRN

## 2014-04-13 MED ORDER — LITHIUM CARBONATE 300 MG PO TABS: 300.0000 mg | ORAL_TABLET | Freq: Every day | ORAL | Status: DC
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
## 2014-04-25 ENCOUNTER — Telehealth (HOSPITAL_COMMUNITY): Payer: Self-pay | Admitting: Psychiatry

## 2014-04-25 NOTE — Telephone Encounter (Signed)
Called and left message for pt to call back.   Pt has left copies of following scripts issued today by Dr. Ayesha Rumpf at [facility]. 1. Ambien CR 12.5mg  po qHS #30, no refills 2. Xanax 2mg  take 1 tab po TID #90, no refills 3. Vicoprofen 7.5/200mg  take 1-2 tabs po q6hrs prn #120, no refills.

## 2014-05-01 ENCOUNTER — Telehealth (HOSPITAL_COMMUNITY): Payer: Self-pay | Admitting: *Deleted

## 2014-05-01 NOTE — Telephone Encounter (Signed)
Pharmacist([facility] [address]) left message-Needs clarification on doses of medications.Has different prescriptions.  Gave information to Dr. Doyne Keel on 04/27/14 for review --per her note:  Charlcie Cradle, MD at 04/25/2014  4:08 PM    Status: Signed          Called and left message for pt to call back.  Pt has left copies of following scripts issued today by Dr. Ayesha Rumpf at [facility]. 1. Ambien CR 12.5mg  po qHS #30, no refills 2. Xanax 2mg  take 1 tab po TID #90, no refills 3. Vicoprofen 7.5/200mg  take 1-2 tabs po q6hrs prn #120, no refills.   On 04/13/14- Dr. Doyne Keel had given RX for Xanax 0.5 mg, TID for anxiety, #90/1 R AND Ambien CR, 6.25 mg @ HS, #30/1 R. Per Dr. Doyne Keel, she had given pt those RX when she did not know the strengths that the pt's other MD had given her. 04/27/14: Dr. Doyne Keel asked that pharmacy ([facility] [address]) be contacted and that RX from Dr. Ayesha Rumpf be filled and  RX from her (Dr. Doyne Keel) be destroyed. This was done.

## 2014-05-16 ENCOUNTER — Telehealth (HOSPITAL_COMMUNITY): Payer: Self-pay | Admitting: *Deleted

## 2014-05-16 NOTE — Telephone Encounter (Signed)
Patient called to request refill of Xanax and Ambien. Wants to remain on doses of medication that were originally prescribed for her by Dr. Ayesha Rumpf.  Current meds: Alprazolam 2 mg TID - currently taking and working well Ambien CR 6.25mg  - not working well  Pt reports she has terminated relationship with Dr. Ayesha Rumpf. Needs refill of medications before appt (see previous note regarding doses)

## 2014-05-18 ENCOUNTER — Other Ambulatory Visit (HOSPITAL_COMMUNITY): Payer: Self-pay | Admitting: Psychiatry

## 2014-05-18 DIAGNOSIS — G4700 Insomnia, unspecified: Secondary | ICD-10-CM

## 2014-05-23 MED ORDER — ZOLPIDEM TARTRATE ER 12.5 MG PO TBCR: 12.5000 mg | EXTENDED_RELEASE_TABLET | Freq: Every evening | ORAL | Status: DC | PRN

## 2014-05-30 ENCOUNTER — Telehealth (HOSPITAL_COMMUNITY): Payer: Self-pay | Admitting: Psychiatry

## 2014-05-30 ENCOUNTER — Emergency Department (HOSPITAL_COMMUNITY)
Admission: EM | Admit: 2014-05-30 | Discharge: 2014-05-31 | Disposition: A | Discharge status: Home / Self Care | Payer: Medicare Other | Attending: Emergency Medicine | Admitting: Emergency Medicine

## 2014-05-30 ENCOUNTER — Encounter (HOSPITAL_COMMUNITY): Payer: Self-pay | Admitting: Emergency Medicine

## 2014-05-30 DIAGNOSIS — F79 Unspecified intellectual disabilities: Secondary | ICD-10-CM | POA: Insufficient documentation

## 2014-05-30 DIAGNOSIS — F1310 Sedative, hypnotic or anxiolytic abuse, uncomplicated: Secondary | ICD-10-CM | POA: Diagnosis not present

## 2014-05-30 DIAGNOSIS — R1032 Left lower quadrant pain: Secondary | ICD-10-CM | POA: Diagnosis not present

## 2014-05-30 DIAGNOSIS — I10 Essential (primary) hypertension: Secondary | ICD-10-CM | POA: Insufficient documentation

## 2014-05-30 DIAGNOSIS — Z79899 Other long term (current) drug therapy: Secondary | ICD-10-CM | POA: Insufficient documentation

## 2014-05-30 DIAGNOSIS — G8929 Other chronic pain: Secondary | ICD-10-CM | POA: Diagnosis not present

## 2014-05-30 DIAGNOSIS — Z8719 Personal history of other diseases of the digestive system: Secondary | ICD-10-CM | POA: Insufficient documentation

## 2014-05-30 DIAGNOSIS — R45851 Suicidal ideations: Secondary | ICD-10-CM | POA: Diagnosis present

## 2014-05-30 DIAGNOSIS — F419 Anxiety disorder, unspecified: Secondary | ICD-10-CM | POA: Diagnosis not present

## 2014-05-30 DIAGNOSIS — F331 Major depressive disorder, recurrent, moderate: Secondary | ICD-10-CM | POA: Diagnosis not present

## 2014-05-30 DIAGNOSIS — Z90710 Acquired absence of both cervix and uterus: Secondary | ICD-10-CM | POA: Diagnosis not present

## 2014-05-30 DIAGNOSIS — Z880 Allergy status to penicillin: Secondary | ICD-10-CM | POA: Diagnosis not present

## 2014-05-30 DIAGNOSIS — F1110 Opioid abuse, uncomplicated: Secondary | ICD-10-CM | POA: Insufficient documentation

## 2014-05-30 DIAGNOSIS — R1031 Right lower quadrant pain: Secondary | ICD-10-CM | POA: Diagnosis not present

## 2014-05-30 LAB — COMPREHENSIVE METABOLIC PANEL
ALT: 23 U/L (ref 0–35)
AST: 20 U/L (ref 0–37)
Albumin: 4.8 g/dL (ref 3.5–5.2)
Alkaline Phosphatase: 73 U/L (ref 39–117)
Anion gap: 14 (ref 5–15)
BUN: 15 mg/dL (ref 6–23)
CALCIUM: 10 mg/dL (ref 8.4–10.5)
CO2: 31 mEq/L (ref 19–32)
CREATININE: 1.04 mg/dL (ref 0.50–1.10)
Chloride: 95 mEq/L — ABNORMAL LOW (ref 96–112)
GFR calc Af Amer: 72 mL/min — ABNORMAL LOW (ref >90)
GFR calc non Af Amer: 62 mL/min — ABNORMAL LOW (ref >90)
GLUCOSE: 96 mg/dL (ref 70–99)
Potassium: 3.3 mEq/L — ABNORMAL LOW (ref 3.7–5.3)
SODIUM: 140 meq/L (ref 137–147)
TOTAL PROTEIN: 8 g/dL (ref 6.0–8.3)
Total Bilirubin: 0.5 mg/dL (ref 0.3–1.2)

## 2014-05-30 LAB — CBC
HCT: 39.4 % (ref 36.0–46.0)
HEMOGLOBIN: 13.8 g/dL (ref 12.0–15.0)
MCH: 32.1 pg (ref 26.0–34.0)
MCHC: 35 g/dL (ref 30.0–36.0)
MCV: 91.6 fL (ref 78.0–100.0)
PLATELETS: 325 10*3/uL (ref 150–400)
RBC: 4.3 MIL/uL (ref 3.87–5.11)
RDW: 13.5 % (ref 11.5–15.5)
WBC: 10.1 10*3/uL (ref 4.0–10.5)

## 2014-05-30 LAB — URINALYSIS, ROUTINE W REFLEX MICROSCOPIC
Bilirubin Urine: NEGATIVE
GLUCOSE, UA: NEGATIVE mg/dL
Hgb urine dipstick: NEGATIVE
Ketones, ur: NEGATIVE mg/dL
LEUKOCYTES UA: NEGATIVE
Nitrite: NEGATIVE
PH: 5.5 (ref 5.0–8.0)
Protein, ur: NEGATIVE mg/dL
Specific Gravity, Urine: 1.024 (ref 1.005–1.030)
Urobilinogen, UA: 0.2 mg/dL (ref 0.0–1.0)

## 2014-05-30 LAB — RAPID URINE DRUG SCREEN, HOSP PERFORMED
Amphetamines: NOT DETECTED
Barbiturates: NOT DETECTED
Benzodiazepines: POSITIVE — AB
Cocaine: NOT DETECTED
OPIATES: POSITIVE — AB
Tetrahydrocannabinol: NOT DETECTED

## 2014-05-30 LAB — URINE MICROSCOPIC-ADD ON

## 2014-05-30 LAB — ETHANOL: Alcohol, Ethyl (B): <11 mg/dL (ref 0–11)

## 2014-05-30 LAB — ACETAMINOPHEN LEVEL: Acetaminophen (Tylenol), Serum: <15 ug/mL (ref 10–30)

## 2014-05-30 LAB — SALICYLATE LEVEL

## 2014-05-30 LAB — LITHIUM LEVEL

## 2014-05-30 MED ORDER — IRBESARTAN 75 MG PO TABS
75.0000 mg | ORAL_TABLET | Freq: Every day | ORAL | Status: DC
  Administered 2014-05-30 – 2014-05-31 (×2): 75 mg via ORAL
  Filled 2014-05-30 (×2): qty 1

## 2014-05-30 MED ORDER — LORAZEPAM 1 MG PO TABS
1.0000 mg | ORAL_TABLET | Freq: Three times a day (TID) | ORAL | Status: DC | PRN
  Administered 2014-05-31 (×2): 1 mg via ORAL
  Filled 2014-05-30 (×2): qty 1

## 2014-05-30 MED ORDER — OXYCODONE HCL 5 MG PO TABS
10.0000 mg | ORAL_TABLET | Freq: Three times a day (TID) | ORAL | Status: DC | PRN
  Administered 2014-05-30 – 2014-05-31 (×4): 10 mg via ORAL
  Filled 2014-05-30 (×4): qty 2

## 2014-05-30 MED ORDER — ALPRAZOLAM 0.5 MG PO TABS
0.5000 mg | ORAL_TABLET | Freq: Three times a day (TID) | ORAL | Status: DC | PRN
  Administered 2014-05-30 – 2014-05-31 (×3): 0.5 mg via ORAL
  Filled 2014-05-30 (×3): qty 1

## 2014-05-30 MED ORDER — ZOLPIDEM TARTRATE 5 MG PO TABS
5.0000 mg | ORAL_TABLET | Freq: Every day | ORAL | Status: DC
  Administered 2014-05-30: 5 mg via ORAL
  Filled 2014-05-30: qty 1

## 2014-05-30 MED ORDER — HYDROCODONE-IBUPROFEN 7.5-200 MG PO TABS: 2.0000 | ORAL_TABLET | Freq: Four times a day (QID) | ORAL | Status: DC | PRN

## 2014-05-30 MED ORDER — LITHIUM CARBONATE 300 MG PO CAPS
300.0000 mg | ORAL_CAPSULE | Freq: Every day | ORAL | Status: DC
  Administered 2014-05-30 – 2014-05-31 (×2): 300 mg via ORAL
  Filled 2014-05-30 (×2): qty 1

## 2014-05-30 NOTE — ED Notes (Signed)
Pt presents w/ [employer].  Per IVC paperwork, Pt reported SI to Mobile Crisis Unit.  Pt stated "I would rather die and kill myself than be in this pain."  Pt did not state a plan.  Pt reports passive HI towards others when she is in pain w/o her narcotic pain medications.  Pt has been prescribed depakote.  Pt is a danger to self.    [employer] reports Pt was checking herself in for pain management when they were dispatched w/ IVC paperwork.  According to a chart review, the Pt contacted [facility] earlier today.  Per those notes, Pt is upset, because her doctors are refusing to refill narcotic prescriptions for chronic pain.  Sts Pt is noncompliant w/ appointments.

## 2014-05-30 NOTE — ED Notes (Signed)
Patient has on burgundy scrubs and yellow socks.  Patient has been wanded by security.Patient has 2 personal belonging bags one with clothes and one with a silver pocket book in it. Her cell phone is in the silver bag.  Patient has a black pocketbook as well.

## 2014-05-30 NOTE — ED Notes (Signed)
Pt reports SI w/o plan and chronic pain lower abdominal pain.  Pain score 10/10.  Pt reports taking 2 vicoprofen this morning.
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
## 2014-05-30 NOTE — ED Notes (Signed)
Bed: FRT02 Expected date:  Expected time:  Means of arrival:  Comments: Hold Triage 3- wait until 7p
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
## 2014-05-31 DIAGNOSIS — F331 Major depressive disorder, recurrent, moderate: Secondary | ICD-10-CM | POA: Diagnosis not present

## 2014-05-31 MED ORDER — HYDROCODONE-IBUPROFEN 7.5-200 MG PO TABS: 1.0000 | ORAL_TABLET | Freq: Every day | ORAL | Status: DC | PRN

## 2014-05-31 NOTE — BH Assessment (Signed)
Tele Assessment Note   Dorothy Alvarez is a 48 y.o. female who presents to [facility] via IVC petition, initiated by [employer].  Pt reported that she was SI and continued to endorse SI to this writer without a plan to harm self.  Pt stated to mobile crisis, she would rather die and kill herself than to be in pain.  Pt states her SI thoughts are attributed to her chronic abdominal pain.  Pt is upset that her PCP--Dr. Ayesha Rumpf, dismissed her from her care without a plan to addressing her pain and told this writer that upon d/c she was going to return to the dr.'s office and talk with her her.  Pt is exhibiting passive HI thoughts toward dr., stating that due to her pain with no relief and being dismissed as a patient, she could kill the Dr. Ayesha Rumpf; she has no plan or intent to harm physician.  Pt has no past SI attempts and currently receiving outpatient with [facility] for therapy, no psychiatrist.  Pt has pressured speech and tangential and must be re-directed to continue interview.    Axis I: Major depressive disorder, Recurrent episode, Moderate Axis II: Deferred Axis III:  Past Medical History  Diagnosis Date  . Hypertension   . Depression   . Anxiety   . Chronic abdominal pain   . IBS (irritable bowel syndrome)   . Constipation   . Mental retardation    Axis IV: other psychosocial or environmental problems, problems related to social environment and problems with primary support group Axis V: 41-50 serious symptoms  Past Medical History:  Past Medical History  Diagnosis Date  . Hypertension   . Depression   . Anxiety   . Chronic abdominal pain   . IBS (irritable bowel syndrome)   . Constipation   . Mental retardation     Past Surgical History  Procedure Laterality Date  . Abdominal hysterectomy    . Knee surgery      Family History:  Family History  Problem Relation Age of Onset  . Diabetes Mother   . Suicidality Mother   . Heart disease Father   . Depression Father     Social  History:  reports that she has never smoked. She has never used smokeless tobacco. She reports that she does not drink alcohol or use illicit drugs.  Additional Social History:  Alcohol / Drug Use Pain Medications: See MAR  Prescriptions: See MAR  Over the Counter: See MAR  History of alcohol / drug use?: No history of alcohol / drug abuse Longest period of sobriety (when/how long): None   CIWA: CIWA-Ar BP: 98/65 mmHg Pulse Rate: 78 COWS:    PATIENT STRENGTHS: (choose at least two) Communication skills  Allergies:  Allergies  Allergen Reactions  . Penicillins Itching  . Tylenol [Acetaminophen] Itching    Home Medications:  (Not in a hospital admission)  OB/GYN Status:  No LMP recorded. Patient has had a hysterectomy.  General Assessment Data Location of Assessment: [facility] Is this a Tele or Face-to-Face Assessment?: Face-to-Face Is this an Initial Assessment or a Re-assessment for this encounter?: Initial Assessment Living Arrangements: Alone Can pt return to current living arrangement?: Yes Admission Status: Involuntary Is patient capable of signing voluntary admission?: No Transfer from: Home Referral Source: Other Dance movement psychotherapist )  Medical Screening Exam ([place]) Medical Exam completed: No Reason for MSE not completed: Other: (None )  [facility] Care Plan Living Arrangements: Alone Name of Psychiatrist: None  Name  of Therapist: [facility]   Education Status Is patient currently in school?: No Current Grade: None  Highest grade of school patient has completed: None  Name of school: None  Contact person: None   Risk to self with the past 6 months Suicidal Ideation: Yes-Currently Present Suicidal Intent: No-Not Currently/Within Last 6 Months Is patient at risk for suicide?: No Suicidal Plan?: No Access to Means: Yes Specify Access to Suicidal Means: Pills, Sharps  What has been your use of drugs/alcohol within the last 12 months?: None  Previous  Attempts/Gestures: No How many times?: 0 Other Self Harm Risks: None Triggers for Past Attempts: None known Intentional Self Injurious Behavior: None Family Suicide History: No Recent stressful life event(s): Recent negative physical changes (Medical issues-abdominal pain ) Persecutory voices/beliefs?: No Depression: Yes Depression Symptoms: Feeling angry/irritable;Loss of interest in usual pleasures Substance abuse history and/or treatment for substance abuse?: No Suicide prevention information given to non-admitted patients: Not applicable  Risk to Others within the past 6 months Homicidal Ideation: Yes-Currently Present Thoughts of Harm to Others: Yes-Currently Present Comment - Thoughts of Harm to Others: References issues with PCP-Dr. Ayesha Rumpf  Current Homicidal Intent: No Current Homicidal Plan: No Access to Homicidal Means: No Identified Victim: Dr. Donnal Debar  History of harm to others?: Yes (Verbal aggression ) Assessment of Violence: None Noted Violent Behavior Description: None  Does patient have access to weapons?: No Criminal Charges Pending?: No Does patient have a court date: No  Psychosis Hallucinations: None noted Delusions: None noted  Mental Status Report Appear/Hygiene: In scrubs Eye Contact: Good Motor Activity: Unremarkable Speech: Logical/coherent;Pressured;Loud Level of Consciousness: Alert Mood: Anxious;Irritable Affect: Anxious;Irritable Anxiety Level: Minimal Thought Processes: Coherent;Relevant Judgement: Unimpaired Orientation: Person;Place;Time;Situation Obsessive Compulsive Thoughts/Behaviors: None  Cognitive Functioning Concentration: Normal Memory: Recent Intact;Remote Intact IQ: Average Insight: Poor Impulse Control: Fair Appetite: Fair Weight Loss: 0 Weight Gain: 0 Sleep: Decreased Total Hours of Sleep: 6 Vegetative Symptoms: None  ADLScreening [facility] Assessment Services) Patient's cognitive ability adequate to safely complete  daily activities?: Yes Patient able to express need for assistance with ADLs?: Yes Independently performs ADLs?: Yes (appropriate for developmental age)  Prior Inpatient Therapy Prior Inpatient Therapy: Yes Prior Therapy Dates: 2006 Prior Therapy Facilty/Provider(s): [facility]  Reason for Treatment: SA  Prior Outpatient Therapy Prior Outpatient Therapy: Yes Prior Therapy Dates: Current  Prior Therapy Facilty/Provider(s): [facility]  Reason for Treatment: Therapy   ADL Screening (condition at time of admission) Patient's cognitive ability adequate to safely complete daily activities?: Yes Is the patient deaf or have difficulty hearing?: No Does the patient have difficulty seeing, even when wearing glasses/contacts?: No Does the patient have difficulty concentrating, remembering, or making decisions?: No Patient able to express need for assistance with ADLs?: Yes Does the patient have difficulty dressing or bathing?: No Independently performs ADLs?: Yes (appropriate for developmental age) Does the patient have difficulty walking or climbing stairs?: No Weakness of Legs: None Weakness of Arms/Hands: None  Home Assistive Devices/Equipment Home Assistive Devices/Equipment: None  Therapy Consults (therapy consults require a physician order) PT Evaluation Needed: No OT Evalulation Needed: No SLP Evaluation Needed: No Abuse/Neglect Assessment (Assessment to be complete while patient is alone) Physical Abuse: Denies Verbal Abuse: Denies Sexual Abuse: Denies Exploitation of patient/patient's resources: Denies Self-Neglect: Denies Values / Beliefs Cultural Requests During Hospitalization: None Spiritual Requests During Hospitalization: None Consults Spiritual Care Consult Needed: No Social Work Consult Needed: No Regulatory affairs officer (For Healthcare) Does patient have an advance directive?: No Would patient like information on creating an advanced  directive?: No - patient declined  information Nutrition Screen- [facility] Patient's home diet: Regular  Additional Information 1:1 In Past 12 Months?: No CIRT Risk: No Elopement Risk: No Does patient have medical clearance?: Yes     Disposition:  Disposition Initial Assessment Completed for this Encounter: Yes Disposition of Patient: Referred to (AM psych eval for final disposition ) Patient referred to: Other (Comment) (AM psych eval for final disposition )  Girtha Rm 05/31/2014 7:20 AM

## 2014-05-31 NOTE — BHH Suicide Risk Assessment (Signed)
Suicide Risk Assessment  Discharge Assessment     Demographic Factors:  NA  Total Time spent with patient: 45 minutes  Psychiatric Specialty Exam:     Blood pressure 93/68, pulse 87, temperature 98.2 F (36.8 C), temperature source Oral, resp. rate 16, SpO2 98.00%.There is no weight on file to calculate BMI.  General Appearance: Casual  Eye Contact::  Good  Speech:  Clear and Coherent  Volume:  Normal  Mood:  Depressed  Affect:  Appropriate  Thought Process:  Coherent and Logical  Orientation:  Full (Time, Place, and Person)  Thought Content:  Negative  Suicidal Thoughts:  Yes.  without intent/plan  Homicidal Thoughts:  No  Memory:  Immediate;   Good Recent;   Good Remote;   Good  Judgement:  Intact  Insight:  Fair  Psychomotor Activity:  Normal  Concentration:  Good  Recall:  Good  Fund of Knowledge:Good  Language: Good  Akathisia:  Negative  Handed:  Right  AIMS (if indicated):     Assets:  Communication Skills Desire for Improvement Financial Resources/Insurance Housing  Sleep:       Musculoskeletal: Strength & Muscle Tone: within normal limits Gait & Station: normal Patient leans: N/A   Mental Status Per Nursing Assessment::   On Admission:     Current Mental Status by Physician: Suicide ideation indicated by: Patient  Loss Factors: pain medicine being abruptly discontinued  Historical Factors: NA  Risk Reduction Factors:   Sense of responsibility to family, Religious beliefs about death, Positive social support and Positive therapeutic relationship  Continued Clinical Symptoms:  Depression:   Comorbid alcohol abuse/dependence  Cognitive Features That Contribute To Risk:  none    Suicide Risk:  Minimal: No identifiable suicidal ideation.  Patients presenting with no risk factors but with morbid ruminations; may be classified as minimal risk based on the severity of the depressive symptoms  Discharge Diagnoses:   AXIS I:  major  depression recurrent moderate, opoid dependence AXIS II:  Deferred AXIS III:   Past Medical History  Diagnosis Date  . Hypertension   . Depression   . Anxiety   . Chronic abdominal pain   . IBS (irritable bowel syndrome)   . Constipation   . Mental retardation    AXIS IV:  abrupt discontinuation of pain meds AXIS V:  51-60 moderate symptoms  Plan Of Care/Follow-up recommendations:  Activity:  resume usual activity Diet:  resume usual diet  Is patient on multiple antipsychotic therapies at discharge:  No   Has Patient had three or more failed trials of antipsychotic monotherapy by history:  No  Recommended Plan for Multiple Antipsychotic Therapies: NA    Earon Rivest D 05/31/2014, 1:29 PM

## 2014-05-31 NOTE — BH Assessment (Signed)
Discharge home to follow up with the [facility]. Patient's follow up appointment is scheduled for 06/06/2014 @ 1015. The clinic is located at [address], [place] and contact # is [phone]. Writer faxed d/c summary, notes from visit, and demographics/insurance as requested to [phone].

## 2014-05-31 NOTE — BH Assessment (Signed)
IVC rescinded by Dr. Lovena Le

## 2014-05-31 NOTE — Consult Note (Signed)
[facility] Consult   Reason for Consult:  Suicidal ideation secondary to chronic pain Referring Physician:  ER MD  Dorothy Alvarez is an 48 y.o. female. Total Time spent with patient: 45 minutes  Assessment: AXIS I:  major depression recurrent moderate, opioid dependence AXIS II:  Deferred AXIS III:   Past Medical History  Diagnosis Date  . Hypertension   . Depression   . Anxiety   . Chronic abdominal pain   . IBS (irritable bowel syndrome)   . Constipation   . Mental retardation    AXIS IV:  chronic pain and depression AXIS V:  51-60 moderate symptoms  Plan:  No evidence of imminent risk to self or others at present.    Subjective:   Dorothy Alvarez is a 48 y.o. female patient admitted with chronic pain and depression.  HPI:  Dorothy Alvarez says she was fired from the practice of her PCP who was in the process of tapering her pain meds.  She was referred here to be admitted for opioid detox.  She was already depressed and said this made her start thinking suicidal thoughts.  She does not want to kill herself but is afraid of going through withdrawal.   HPI Elements:   Location:  depression and fears of opoid withdrawal. Quality:  some suicidal thoughts if she is going to have pain and withdrawal at the same time. Severity:  does not want to kill herself, just wants some pain relief. 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 Duration:  7 years of pain. Context:  as above.  Past Psychiatric History: Past Medical History  Diagnosis Date  . Hypertension   . Depression   . Anxiety   . Chronic abdominal pain   . IBS (irritable bowel syndrome)   . Constipation   . Mental retardation     reports that she has never smoked. She has never used smokeless tobacco. She reports that she does not drink alcohol or use illicit drugs. Family History  Problem Relation Age of Onset  . Diabetes Mother   . Suicidality Mother   . Heart disease Father   . Depression Father    Family  History Substance Abuse: No Family Supports: No Living Arrangements: Alone Can pt return to current living arrangement?: Yes Abuse/Neglect [facility]) Physical Abuse: Denies Verbal Abuse: Denies Sexual Abuse: Denies Allergies:   Allergies  Allergen Reactions  . Penicillins Itching  . Tylenol [Acetaminophen] Itching    ACT Assessment Complete:  Yes:    Educational Status    Risk to Self: Risk to self with the past 6 months Suicidal Ideation: Yes-Currently Present Suicidal Intent: No-Not Currently/Within Last 6 Months Is patient at risk for suicide?: No Suicidal Plan?: No Access to Means: Yes Specify Access to Suicidal Means: Pills, Sharps  What has been your use of drugs/alcohol within the last 12 months?: None  Previous Attempts/Gestures: No How many times?: 0 Other Self Harm Risks: None Triggers for Past Attempts: None known Intentional Self Injurious Behavior: None Family Suicide History: No Recent stressful life event(s): Recent negative physical changes (Medical issues-abdominal pain ) Persecutory voices/beliefs?: No Depression: Yes Depression Symptoms: Feeling angry/irritable;Loss of interest in usual pleasures Substance abuse history and/or treatment for substance abuse?: No Suicide prevention information given to non-admitted patients: Not applicable  Risk to Others: Risk to Others within the past 6 months Homicidal Ideation: Yes-Currently Present Thoughts of Harm to Others: Yes-Currently Present Comment - Thoughts of Harm to Others: References issues with PCP-Dr. Ayesha Rumpf  Current Homicidal Intent: No Current Homicidal Plan: No Access to Homicidal Means: No Identified Victim: Dr. Donnal Debar  History of harm to others?: Yes (Verbal aggression ) Assessment of Violence: None Noted Violent Behavior Description: None  Does patient have access to weapons?: No Criminal Charges Pending?: No Does patient have a court date: No  Abuse: Abuse/Neglect Assessment (Assessment to  be complete while patient is alone) Physical Abuse: Denies Verbal Abuse: Denies Sexual Abuse: Denies Exploitation of patient/patient's resources: Denies Self-Neglect: Denies  Prior Inpatient Therapy: Prior Inpatient Therapy Prior Inpatient Therapy: Yes Prior Therapy Dates: 2006 Prior Therapy Facilty/Provider(s): [facility]  Reason for Treatment: SA  Prior Outpatient Therapy: Prior Outpatient Therapy Prior Outpatient Therapy: Yes Prior Therapy Dates: Current  Prior Therapy Facilty/Provider(s): [facility]  Reason for Treatment: Therapy   Additional Information: Additional Information 1:1 In Past 12 Months?: No CIRT Risk: No Elopement Risk: No Does patient have medical clearance?: Yes                  Objective: Blood pressure 93/68, pulse 87, temperature 98.2 F (36.8 C), temperature source Oral, resp. rate 16, SpO2 98.00%.There is no weight on file to calculate BMI. Results for orders placed during the hospital encounter of 05/30/14 (from the past 72 hour(s))  URINALYSIS, ROUTINE W REFLEX MICROSCOPIC     Status: Abnormal   Collection Time    05/30/14  4:48 PM      Result Value Ref Range   Color, Urine YELLOW  YELLOW   APPearance TURBID (*) CLEAR   Specific Gravity, Urine 1.024  1.005 - 1.030   pH 5.5  5.0 - 8.0   Glucose, UA NEGATIVE  NEGATIVE mg/dL   Hgb urine dipstick NEGATIVE  NEGATIVE   Bilirubin Urine NEGATIVE  NEGATIVE   Ketones, ur NEGATIVE  NEGATIVE mg/dL   Protein, ur NEGATIVE  NEGATIVE mg/dL   Urobilinogen, UA 0.2  0.0 - 1.0 mg/dL   Nitrite NEGATIVE  NEGATIVE   Leukocytes, UA NEGATIVE  NEGATIVE  URINE MICROSCOPIC-ADD ON     Status: None   Collection Time    05/30/14  4:48 PM      Result Value Ref Range   Urine-Other AMORPHOUS URATES/PHOSPHATES    LITHIUM LEVEL     Status: Abnormal   Collection Time    05/30/14  6:13 PM      Result Value Ref Range   Lithium Lvl <0.25 (*) 0.80 - 1.40 mEq/L  ACETAMINOPHEN LEVEL     Status: None   Collection Time     05/30/14  6:14 PM      Result Value Ref Range   Acetaminophen (Tylenol), Serum <15.0  10 - 30 ug/mL   Comment:            THERAPEUTIC CONCENTRATIONS VARY     SIGNIFICANTLY. A RANGE OF 10-30     ug/mL MAY BE AN EFFECTIVE     CONCENTRATION FOR MANY PATIENTS.     HOWEVER, SOME ARE BEST TREATED     AT CONCENTRATIONS OUTSIDE THIS     RANGE.     ACETAMINOPHEN CONCENTRATIONS     >150 ug/mL AT 4 HOURS AFTER     INGESTION AND >50 ug/mL AT 12     HOURS AFTER INGESTION ARE     OFTEN ASSOCIATED WITH TOXIC     REACTIONS.  CBC     Status: None   Collection Time    05/30/14  6:14 PM      Result Value Ref Range  WBC 10.1  4.0 - 10.5 K/uL   RBC 4.30  3.87 - 5.11 MIL/uL   Hemoglobin 13.8  12.0 - 15.0 g/dL   HCT 39.4  36.0 - 46.0 %   MCV 91.6  78.0 - 100.0 fL   MCH 32.1  26.0 - 34.0 pg   MCHC 35.0  30.0 - 36.0 g/dL   RDW 13.5  11.5 - 15.5 %   Platelets 325  150 - 400 K/uL  COMPREHENSIVE METABOLIC PANEL     Status: Abnormal   Collection Time    05/30/14  6:14 PM      Result Value Ref Range   Sodium 140  137 - 147 mEq/L   Potassium 3.3 (*) 3.7 - 5.3 mEq/L   Chloride 95 (*) 96 - 112 mEq/L   CO2 31  19 - 32 mEq/L   Glucose, Bld 96  70 - 99 mg/dL   BUN 15  6 - 23 mg/dL   Creatinine, Ser 1.04  0.50 - 1.10 mg/dL   Calcium 10.0  8.4 - 10.5 mg/dL   Total Protein 8.0  6.0 - 8.3 g/dL   Albumin 4.8  3.5 - 5.2 g/dL   AST 20  0 - 37 U/L   ALT 23  0 - 35 U/L   Alkaline Phosphatase 73  39 - 117 U/L   Total Bilirubin 0.5  0.3 - 1.2 mg/dL   GFR calc non Af Amer 62 (*) >90 mL/min   GFR calc Af Amer 72 (*) >90 mL/min   Comment: (NOTE)     The eGFR has been calculated using the CKD EPI equation.     This calculation has not been validated in all clinical situations.     eGFR's persistently <90 mL/min signify possible Chronic Kidney     Disease.   Anion gap 14  5 - 15  ETHANOL     Status: None   Collection Time    05/30/14  6:14 PM      Result Value Ref Range   Alcohol, Ethyl (B) <11  0 - 11  mg/dL   Comment:            LOWEST DETECTABLE LIMIT FOR     SERUM ALCOHOL IS 11 mg/dL     FOR MEDICAL PURPOSES ONLY  SALICYLATE LEVEL     Status: Abnormal   Collection Time    05/30/14  6:14 PM      Result Value Ref Range   Salicylate Lvl <3.5 (*) 2.8 - 20.0 mg/dL  URINE RAPID DRUG SCREEN (HOSP PERFORMED)     Status: Abnormal   Collection Time    05/30/14  6:20 PM      Result Value Ref Range   Opiates POSITIVE (*) NONE DETECTED   Cocaine NONE DETECTED  NONE DETECTED   Benzodiazepines POSITIVE (*) NONE DETECTED   Amphetamines NONE DETECTED  NONE DETECTED   Tetrahydrocannabinol NONE DETECTED  NONE DETECTED   Barbiturates NONE DETECTED  NONE DETECTED   Comment:            DRUG SCREEN FOR MEDICAL PURPOSES     ONLY.  IF CONFIRMATION IS NEEDED     FOR ANY PURPOSE, NOTIFY LAB     WITHIN 5 DAYS.                LOWEST DETECTABLE LIMITS     FOR URINE DRUG SCREEN     Drug Class       Cutoff (ng/mL)  Amphetamine      1000     Barbiturate      200     Benzodiazepine   761     Tricyclics       607     Opiates          300     Cocaine          300     THC              50   Labs are reviewed and are pertinent for no psychiatric issues.  Current Facility-Administered Medications  Medication Dose Route Frequency Provider Last Rate Last Dose  . ALPRAZolam Duanne Moron) tablet 0.5 mg  0.5 mg Oral TID PRN Blanchie Dessert, MD   0.5 mg at 05/31/14 0832  . irbesartan (AVAPRO) tablet 75 mg  75 mg Oral Daily Blanchie Dessert, MD   75 mg at 05/31/14 1050  . lithium carbonate capsule 300 mg  300 mg Oral Daily Blanchie Dessert, MD   300 mg at 05/31/14 1050  . LORazepam (ATIVAN) tablet 1 mg  1 mg Oral Q8H PRN Blanchie Dessert, MD   1 mg at 05/31/14 1305  . oxyCODONE (Oxy IR/ROXICODONE) immediate release tablet 10 mg  10 mg Oral TID PRN Blanchie Dessert, MD   10 mg at 05/31/14 1305  . zolpidem (AMBIEN) tablet 5 mg  5 mg Oral QHS Blanchie Dessert, MD   5 mg at 05/30/14 2206   Current Outpatient  Prescriptions  Medication Sig Dispense Refill  . alprazolam (XANAX) 2 MG tablet Take 2 mg by mouth 3 (three) times daily.      Marland Kitchen BENICAR 40 MG tablet Take 1 tablet by mouth daily.      . DiphenhydrAMINE HCl (BENADRYL ALLERGY PO) Take 2 tablets by mouth daily as needed (Sinus allergies).      . diphenhydramine-acetaminophen (TYLENOL PM EXTRA STRENGTH) 25-500 MG TABS Take 1 tablet by mouth at bedtime as needed (insomnia).      . furosemide (LASIX) 20 MG tablet Take 20 mg by mouth daily.       Marland Kitchen HYDROcodone-ibuprofen (VICOPROFEN) 7.5-200 MG per tablet Take 2 tablets by mouth 4 (four) times daily as needed for moderate pain or severe pain (pain).       Marland Kitchen lithium 300 MG tablet Take 1 tablet (300 mg total) by mouth daily.  30 tablet  1  . zolpidem (AMBIEN CR) 12.5 MG CR tablet Take 12.5 mg by mouth at bedtime.      Marland Kitchen zolpidem (AMBIEN CR) 12.5 MG CR tablet Take 1 tablet (12.5 mg total) by mouth at bedtime as needed for sleep.  30 tablet  0    Psychiatric Specialty Exam:     Blood pressure 93/68, pulse 87, temperature 98.2 F (36.8 C), temperature source Oral, resp. rate 16, SpO2 98.00%.There is no weight on file to calculate BMI.  General Appearance: Fairly Groomed  Engineer, water::  Good  Speech:  Clear and Coherent  Volume:  Normal  Mood:  Anxious and Depressed  Affect:  Appropriate  Thought Process:  Coherent and Logical  Orientation:  Full (Time, Place, and Person)  Thought Content:  Negative  Suicidal Thoughts:  Yes.  without intent/plan  Homicidal Thoughts:  No  Memory:  Immediate;   Good Recent;   Good Remote;   Good  Judgement:  Fair  Insight:  Fair  Psychomotor Activity:  Normal  Concentration:  Good  Recall:  [place] of Knowledge:Good  Language: Good  Akathisia:  Negative  Handed:  Right  AIMS (if indicated):     Assets:  Communication Skills Desire for Improvement Financial Resources/Insurance Intimacy  Sleep:      Musculoskeletal: Strength & Muscle Tone: within  normal limits Gait & Station: normal Patient leans: N/A  Treatment Plan Summary: has an appointment with a pain clinic on Oct 20.  Will give pain med at very reduced dose to prevent serious withdrawal until her pain clinic appointment.  Discharge home today.  TAYLOR,GERALD D 05/31/2014 1:15 PM

## 2014-06-06 ENCOUNTER — Emergency Department (HOSPITAL_COMMUNITY)
Admission: EM | Admit: 2014-06-06 | Discharge: 2014-06-07 | Disposition: A | Discharge status: Home / Self Care | Payer: Medicare Other | Attending: Emergency Medicine | Admitting: Emergency Medicine

## 2014-06-06 ENCOUNTER — Encounter (HOSPITAL_COMMUNITY): Payer: Self-pay | Admitting: Emergency Medicine

## 2014-06-06 DIAGNOSIS — Z90710 Acquired absence of both cervix and uterus: Secondary | ICD-10-CM | POA: Diagnosis not present

## 2014-06-06 DIAGNOSIS — F1110 Opioid abuse, uncomplicated: Secondary | ICD-10-CM | POA: Insufficient documentation

## 2014-06-06 DIAGNOSIS — G8929 Other chronic pain: Secondary | ICD-10-CM | POA: Insufficient documentation

## 2014-06-06 DIAGNOSIS — Z8719 Personal history of other diseases of the digestive system: Secondary | ICD-10-CM | POA: Insufficient documentation

## 2014-06-06 DIAGNOSIS — R10819 Abdominal tenderness, unspecified site: Secondary | ICD-10-CM | POA: Insufficient documentation

## 2014-06-06 DIAGNOSIS — F1310 Sedative, hypnotic or anxiolytic abuse, uncomplicated: Secondary | ICD-10-CM | POA: Diagnosis not present

## 2014-06-06 DIAGNOSIS — F331 Major depressive disorder, recurrent, moderate: Secondary | ICD-10-CM

## 2014-06-06 DIAGNOSIS — F79 Unspecified intellectual disabilities: Secondary | ICD-10-CM | POA: Diagnosis not present

## 2014-06-06 DIAGNOSIS — Z880 Allergy status to penicillin: Secondary | ICD-10-CM | POA: Insufficient documentation

## 2014-06-06 DIAGNOSIS — F419 Anxiety disorder, unspecified: Secondary | ICD-10-CM | POA: Insufficient documentation

## 2014-06-06 DIAGNOSIS — Z046 Encounter for general psychiatric examination, requested by authority: Secondary | ICD-10-CM | POA: Diagnosis present

## 2014-06-06 DIAGNOSIS — Z79899 Other long term (current) drug therapy: Secondary | ICD-10-CM | POA: Insufficient documentation

## 2014-06-06 DIAGNOSIS — R45851 Suicidal ideations: Secondary | ICD-10-CM

## 2014-06-06 DIAGNOSIS — I10 Essential (primary) hypertension: Secondary | ICD-10-CM | POA: Insufficient documentation

## 2014-06-06 HISTORY — DX: Developmental disorder of scholastic skills, unspecified: F81.9

## 2014-06-06 NOTE — ED Notes (Signed)
Pt states that she's not suicidal she just needs a primary doctor and pain management help. Pt states that Pain management told her they found something in her urine but she states that she doesn't take any street drugs. She says that the Dr was not an [place] and she couldn't understand him well and she has a learning disability also.

## 2014-06-06 NOTE — ED Notes (Signed)
Pt called [facility] threatening to kill herself because she wasn't getting pain medications, They took out IVC papers on her.

## 2014-06-07 ENCOUNTER — Encounter (HOSPITAL_COMMUNITY): Payer: Self-pay | Admitting: Registered Nurse

## 2014-06-07 DIAGNOSIS — F331 Major depressive disorder, recurrent, moderate: Secondary | ICD-10-CM | POA: Diagnosis not present

## 2014-06-07 LAB — COMPREHENSIVE METABOLIC PANEL
ALBUMIN: 4 g/dL (ref 3.5–5.2)
ALT: 23 U/L (ref 0–35)
ANION GAP: 15 (ref 5–15)
AST: 20 U/L (ref 0–37)
Alkaline Phosphatase: 61 U/L (ref 39–117)
BUN: 14 mg/dL (ref 6–23)
CALCIUM: 9.3 mg/dL (ref 8.4–10.5)
CO2: 24 mEq/L (ref 19–32)
CREATININE: 0.88 mg/dL (ref 0.50–1.10)
Chloride: 102 mEq/L (ref 96–112)
GFR calc Af Amer: 88 mL/min — ABNORMAL LOW (ref >90)
GFR calc non Af Amer: 76 mL/min — ABNORMAL LOW (ref >90)
Glucose, Bld: 91 mg/dL (ref 70–99)
Potassium: 3.2 mEq/L — ABNORMAL LOW (ref 3.7–5.3)
Sodium: 141 mEq/L (ref 137–147)
Total Bilirubin: 0.8 mg/dL (ref 0.3–1.2)
Total Protein: 6.8 g/dL (ref 6.0–8.3)

## 2014-06-07 LAB — RAPID URINE DRUG SCREEN, HOSP PERFORMED
AMPHETAMINES: NOT DETECTED
Barbiturates: NOT DETECTED
Benzodiazepines: POSITIVE — AB
COCAINE: NOT DETECTED
OPIATES: POSITIVE — AB
TETRAHYDROCANNABINOL: NOT DETECTED

## 2014-06-07 LAB — ETHANOL: Alcohol, Ethyl (B): <11 mg/dL (ref 0–11)

## 2014-06-07 LAB — CBC WITH DIFFERENTIAL/PLATELET
BASOS ABS: 0 10*3/uL (ref 0.0–0.1)
Basophils Relative: 0 % (ref 0–1)
EOS ABS: 0.6 10*3/uL (ref 0.0–0.7)
EOS PCT: 5 % (ref 0–5)
HCT: 36.4 % (ref 36.0–46.0)
Hemoglobin: 12.6 g/dL (ref 12.0–15.0)
Lymphocytes Relative: 34 % (ref 12–46)
Lymphs Abs: 3.6 10*3/uL (ref 0.7–4.0)
MCH: 31.7 pg (ref 26.0–34.0)
MCHC: 34.6 g/dL (ref 30.0–36.0)
MCV: 91.5 fL (ref 78.0–100.0)
MONO ABS: 0.6 10*3/uL (ref 0.1–1.0)
Monocytes Relative: 5 % (ref 3–12)
NEUTROS ABS: 6 10*3/uL (ref 1.7–7.7)
Neutrophils Relative %: 56 % (ref 43–77)
Platelets: 272 10*3/uL (ref 150–400)
RBC: 3.98 MIL/uL (ref 3.87–5.11)
RDW: 13.5 % (ref 11.5–15.5)
WBC: 10.8 10*3/uL — ABNORMAL HIGH (ref 4.0–10.5)

## 2014-06-07 LAB — URINALYSIS, ROUTINE W REFLEX MICROSCOPIC
Bilirubin Urine: NEGATIVE
GLUCOSE, UA: NEGATIVE mg/dL
HGB URINE DIPSTICK: NEGATIVE
Ketones, ur: NEGATIVE mg/dL
LEUKOCYTES UA: NEGATIVE
Nitrite: NEGATIVE
PH: 6.5 (ref 5.0–8.0)
PROTEIN: NEGATIVE mg/dL
Specific Gravity, Urine: 1.014 (ref 1.005–1.030)
Urobilinogen, UA: 0.2 mg/dL (ref 0.0–1.0)

## 2014-06-07 MED ORDER — HYDROCODONE-ACETAMINOPHEN 7.5-325 MG PO TABS
1.0000 | ORAL_TABLET | Freq: Every day | ORAL | Status: DC | PRN
  Administered 2014-06-07 (×2): 1 via ORAL
  Filled 2014-06-07 (×2): qty 1

## 2014-06-07 MED ORDER — ZOLPIDEM TARTRATE 5 MG PO TABS: 5.0000 mg | ORAL_TABLET | Freq: Every evening | ORAL | Status: DC | PRN

## 2014-06-07 MED ORDER — FUROSEMIDE 20 MG PO TABS
20.0000 mg | ORAL_TABLET | Freq: Every day | ORAL | Status: DC
  Administered 2014-06-07: 20 mg via ORAL
  Filled 2014-06-07: qty 1

## 2014-06-07 MED ORDER — IRBESARTAN 300 MG PO TABS
300.0000 mg | ORAL_TABLET | Freq: Every day | ORAL | Status: DC
  Administered 2014-06-07: 300 mg via ORAL
  Filled 2014-06-07 (×2): qty 1

## 2014-06-07 MED ORDER — HYDROCODONE-IBUPROFEN 7.5-200 MG PO TABS: 1.0000 | ORAL_TABLET | Freq: Every day | ORAL | Status: DC | PRN

## 2014-06-07 MED ORDER — HYDROCODONE-ACETAMINOPHEN 7.5-325 MG PO TABS: 1.0000 | ORAL_TABLET | Freq: Every day | ORAL | Status: DC | PRN

## 2014-06-07 MED ORDER — ZOLPIDEM TARTRATE 5 MG PO TABS
5.0000 mg | ORAL_TABLET | Freq: Every evening | ORAL | Status: DC | PRN
  Administered 2014-06-07: 5 mg via ORAL
  Filled 2014-06-07: qty 1

## 2014-06-07 MED ORDER — HYDROCODONE-ACETAMINOPHEN 7.5-325 MG PO TABS
1.0000 | ORAL_TABLET | Freq: Once | ORAL | Status: AC
  Administered 2014-06-07: 1 via ORAL
  Filled 2014-06-07: qty 1

## 2014-06-07 NOTE — ED Notes (Signed)
Patient called out, states the Ambien has not helped her with sleeping. Patient is requesting additional sleep aide. Apologized to patient and advised at this time of the morning it is not possible to give her any additional sleep aides. Offered to turn off the television to provide a quieter environment. Patient refused. Primary nurse, Cheral Bay, updated.

## 2014-06-07 NOTE — ED Notes (Signed)
Pt talking calmly at bedside with pt.

## 2014-06-07 NOTE — Progress Notes (Signed)
  CARE MANAGEMENT ED NOTE 06/07/2014  Patient:  Dorothy Alvarez,Dorothy Alvarez   Account Number:  [id number]  Date Initiated:  06/07/2014  Documentation initiated by:  Jackelyn Poling  Subjective/Objective Assessment:   48 yr old [place] medicare [place]mo[place]ppo[place]medicaid of [place] pt called [facility] threatening to kill herself because she wasn't getting pain medications, They took out IVC papers on her.     Subjective/Objective Assessment Detail:   dx major depression recurrent moderate     Action/Plan:   CM provided pt with list of [employer] medicare hmo/ppo & medicaid of [place]  for [place] zip code [phone]   Action/Plan Detail:   Anticipated DC Date:  06/07/2014     Status Recommendation to Physician:   Result of Recommendation:    Other ED Services  Consult Working [place]  Other  Outpatient Services - Pt will follow up  PCP issues    Choice offered to / List presented to:            Status of service:  Completed, signed off  ED Comments:   ED Comments Detail:
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
## 2014-06-07 NOTE — ED Notes (Addendum)
Pt. Wall mount was locked up in the cabinet inside the room, along with B/P equipment. Nures was notified. Sitter at the bedside. Suction canisters were locked in cabinet.

## 2014-06-07 NOTE — ED Provider Notes (Signed)
CSN: [id number]     Arrival date & time 06/06/14  2305 History   First MD Initiated Contact with Patient 06/07/14 0109     Chief Complaint  Patient presents with  . Abdominal Pain  . Psychiatric Evaluation     (Consider location/radiation/quality/duration/timing/severity/associated sxs/prior Treatment) HPI 48 yo female returns to the ER under IVC documentation from [facility].  Pt was seen for same on 10/13.  Pt was seen today at pain clinic, and reports she had some testing done there.  She was very angry with the physician she saw, as she did not understand what he was telling her, and that her UDS had drugs of abuse.  She denies taking any illicit drugs.  She was not given a pain prescription at the pain clinic.  She reports that she is suicidal if she cannot get her pain medications and has thoughts of harming providers who will not give her prescriptions.  Pt has ongoing low abdominal pain after hysterectomy, has had u/s and CT scans this year without acute findings.  Pt is also concerned that she does not have a primary care doctor who will give her pain medications. Past Medical History  Diagnosis Date  . Hypertension   . Depression   . Anxiety   . Chronic abdominal pain   . IBS (irritable bowel syndrome)   . Constipation   . Mental retardation    Past Surgical History  Procedure Laterality Date  . Abdominal hysterectomy    . Knee surgery     Family History  Problem Relation Age of Onset  . Diabetes Mother   . Suicidality Mother   . Heart disease Father   . Depression Father    History  Substance Use Topics  . Smoking status: Never Smoker   . Smokeless tobacco: Never Used  . Alcohol Use: No   OB History   Grav Para Term Preterm Abortions TAB SAB Ect Mult Living                 Review of Systems    Allergies  Penicillins  Home Medications   Prior to Admission medications   Medication Sig Start Date End Date Taking? Authorizing Provider  alprazolam  Duanne Moron) 2 MG tablet Take 2 mg by mouth 3 (three) times daily.   Yes Historical Provider, MD  BENICAR 40 MG tablet Take 1 tablet by mouth daily. 12/17/13  Yes Historical Provider, MD  cyclobenzaprine (FLEXERIL) 10 MG tablet Take 10 mg by mouth 3 (three) times daily as needed. For muscle spasms 03/22/14  Yes Historical Provider, MD  diphenhydrAMINE (BENADRYL) 25 mg capsule Take 25-50 mg by mouth every 6 (six) hours as needed (for allergies).   Yes Historical Provider, MD  diphenhydramine-acetaminophen (TYLENOL PM EXTRA STRENGTH) 25-500 MG TABS Take 1 tablet by mouth at bedtime as needed (insomnia).   Yes Historical Provider, MD  furosemide (LASIX) 20 MG tablet Take 20 mg by mouth daily.    Yes Historical Provider, MD  HYDROcodone-ibuprofen (VICOPROFEN) 7.5-200 MG per tablet Take 1 tablet by mouth 5 (five) times daily as needed for moderate pain or severe pain (pain). 05/31/14  Yes Clarene Reamer, MD  zolpidem (AMBIEN CR) 12.5 MG CR tablet Take 12.5 mg by mouth at bedtime.   Yes Historical Provider, MD   BP 158/88  Pulse 75  Temp(Src) 98.1 F (36.7 C) (Oral)  Resp 18  SpO2 100% Physical Exam  Nursing note and vitals reviewed. Constitutional: She is oriented to person,  place, and time. She appears well-developed and well-nourished. She appears distressed.  HENT:  Head: Normocephalic and atraumatic.  Nose: Nose normal.  Mouth/Throat: Oropharynx is clear and moist.  Eyes: Conjunctivae and EOM are normal. Pupils are equal, round, and reactive to light.  Neck: Normal range of motion. Neck supple. No JVD present. No tracheal deviation present. No thyromegaly present.  Cardiovascular: Normal rate, regular rhythm, normal heart sounds and intact distal pulses.  Exam reveals no gallop and no friction rub.   No murmur heard. Pulmonary/Chest: Effort normal and breath sounds normal. No stridor. No respiratory distress. She has no wheezes. She has no rales. She exhibits no tenderness.  Abdominal: Soft. Bowel  sounds are normal. She exhibits no distension and no mass. There is tenderness (lower abdominal tenderness). There is no rebound and no guarding.  Musculoskeletal: Normal range of motion. She exhibits no edema and no tenderness.  Lymphadenopathy:    She has no cervical adenopathy.  Neurological: She is alert and oriented to person, place, and time. She displays normal reflexes. She exhibits normal muscle tone. Coordination normal.  Skin: Skin is warm and dry. No rash noted. No erythema. No pallor.  Psychiatric:  Patient with flat affect, report her mood is angry.  She has poor insight and judgment    ED Course  Procedures (including critical care time) Labs Review Labs Reviewed  CBC WITH DIFFERENTIAL - Abnormal; Notable for the following:    WBC 10.8 (*)    All other components within normal limits  COMPREHENSIVE METABOLIC PANEL - Abnormal; Notable for the following:    Potassium 3.2 (*)    GFR calc non Af Amer 76 (*)    GFR calc Af Amer 88 (*)    All other components within normal limits  URINE RAPID DRUG SCREEN (HOSP PERFORMED) - Abnormal; Notable for the following:    Opiates POSITIVE (*)    Benzodiazepines POSITIVE (*)    All other components within normal limits  ETHANOL  URINALYSIS, ROUTINE W REFLEX MICROSCOPIC    Imaging Review No results found.   EKG Interpretation None      MDM   Final diagnoses:  Major depressive disorder, recurrent episode, moderate  Suicidal ideations  Chronic pain    4 was fired from her primary care Dr. who is trying to cut back on chronic pain patients.  She was seen today by the pain clinic who told her that she had an abnormality in her drug screen.  Patient appears to have some difficulties understanding the instructions given to her by the pain clinic, by her paperwork it appears that she has followups that she needs to do prior to her reevaluation on November 2.  Patient reports extreme anger towards her pain clinic doctor as he did  not give her pain medications.  She reports that she is suicidal and will kill herself because she cannot live with chronic pain.  She has been IVC by a mobile crisis unit.  Patient has told the nursing staff here that she is not suicidal , but to me she reports that she will kill herself due to her chronic pain.  She also reports that she has not in control of her emotions, and may strike out at providers who do not prescribe her pain medications.  Plan for a TTS evaluation    Kalman Drape, MD 06/07/14 [phone]

## 2014-06-07 NOTE — ED Notes (Signed)
Pt has jeans, slip on shoes black, multi color shirt black coat and some papers all placed in personal belongings bag placed in locker#[id number]
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
## 2014-06-15 ENCOUNTER — Ambulatory Visit (INDEPENDENT_AMBULATORY_CARE_PROVIDER_SITE_OTHER): Payer: Medicare Other | Admitting: Psychiatry

## 2014-06-15 ENCOUNTER — Encounter (HOSPITAL_COMMUNITY): Payer: Self-pay | Admitting: Psychiatry

## 2014-06-15 VITALS — BP 149/104 | HR 90 | Ht 65.0 in | Wt 218.0 lb

## 2014-06-15 DIAGNOSIS — F332 Major depressive disorder, recurrent severe without psychotic features: Secondary | ICD-10-CM

## 2014-06-15 DIAGNOSIS — F1120 Opioid dependence, uncomplicated: Secondary | ICD-10-CM

## 2014-06-15 DIAGNOSIS — F411 Generalized anxiety disorder: Secondary | ICD-10-CM

## 2014-06-15 DIAGNOSIS — F1320 Sedative, hypnotic or anxiolytic dependence, uncomplicated: Secondary | ICD-10-CM

## 2014-06-15 MED ORDER — ALPRAZOLAM 2 MG PO TABS: 2.0000 mg | ORAL_TABLET | Freq: Three times a day (TID) | ORAL | Status: DC

## 2014-06-15 MED ORDER — ZOLPIDEM TARTRATE ER 12.5 MG PO TBCR: 12.5000 mg | EXTENDED_RELEASE_TABLET | Freq: Every day | ORAL | Status: DC
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
## 2014-06-16 DIAGNOSIS — F411 Generalized anxiety disorder: Secondary | ICD-10-CM | POA: Insufficient documentation

## 2014-06-16 DIAGNOSIS — F1120 Opioid dependence, uncomplicated: Secondary | ICD-10-CM | POA: Insufficient documentation

## 2014-06-16 DIAGNOSIS — F332 Major depressive disorder, recurrent severe without psychotic features: Secondary | ICD-10-CM | POA: Insufficient documentation

## 2014-06-16 DIAGNOSIS — F1320 Sedative, hypnotic or anxiolytic dependence, uncomplicated: Secondary | ICD-10-CM | POA: Insufficient documentation

## 2014-06-22 ENCOUNTER — Other Ambulatory Visit (HOSPITAL_COMMUNITY): Payer: Self-pay | Admitting: Psychiatry

## 2014-06-23 ENCOUNTER — Encounter (HOSPITAL_COMMUNITY): Payer: Self-pay | Admitting: Emergency Medicine

## 2014-06-23 ENCOUNTER — Emergency Department (HOSPITAL_COMMUNITY)
Admission: EM | Admit: 2014-06-23 | Discharge: 2014-06-24 | Disposition: A | Discharge status: Home / Self Care | Payer: Medicare Other | Attending: Emergency Medicine | Admitting: Emergency Medicine

## 2014-06-23 DIAGNOSIS — F1310 Sedative, hypnotic or anxiolytic abuse, uncomplicated: Secondary | ICD-10-CM | POA: Diagnosis not present

## 2014-06-23 DIAGNOSIS — Z8719 Personal history of other diseases of the digestive system: Secondary | ICD-10-CM | POA: Insufficient documentation

## 2014-06-23 DIAGNOSIS — Z880 Allergy status to penicillin: Secondary | ICD-10-CM | POA: Insufficient documentation

## 2014-06-23 DIAGNOSIS — R109 Unspecified abdominal pain: Secondary | ICD-10-CM

## 2014-06-23 DIAGNOSIS — Z79899 Other long term (current) drug therapy: Secondary | ICD-10-CM | POA: Insufficient documentation

## 2014-06-23 DIAGNOSIS — G8929 Other chronic pain: Secondary | ICD-10-CM | POA: Insufficient documentation

## 2014-06-23 DIAGNOSIS — F1120 Opioid dependence, uncomplicated: Secondary | ICD-10-CM | POA: Diagnosis not present

## 2014-06-23 DIAGNOSIS — F4321 Adjustment disorder with depressed mood: Secondary | ICD-10-CM | POA: Diagnosis not present

## 2014-06-23 DIAGNOSIS — F418 Other specified anxiety disorders: Secondary | ICD-10-CM | POA: Insufficient documentation

## 2014-06-23 DIAGNOSIS — Z90710 Acquired absence of both cervix and uterus: Secondary | ICD-10-CM | POA: Insufficient documentation

## 2014-06-23 DIAGNOSIS — F430 Acute stress reaction: Secondary | ICD-10-CM

## 2014-06-23 DIAGNOSIS — R1030 Lower abdominal pain, unspecified: Secondary | ICD-10-CM | POA: Diagnosis present

## 2014-06-23 DIAGNOSIS — I10 Essential (primary) hypertension: Secondary | ICD-10-CM | POA: Diagnosis not present

## 2014-06-23 DIAGNOSIS — F4325 Adjustment disorder with mixed disturbance of emotions and conduct: Secondary | ICD-10-CM | POA: Insufficient documentation

## 2014-06-23 DIAGNOSIS — F1320 Sedative, hypnotic or anxiolytic dependence, uncomplicated: Secondary | ICD-10-CM | POA: Diagnosis present

## 2014-06-23 DIAGNOSIS — F411 Generalized anxiety disorder: Secondary | ICD-10-CM | POA: Diagnosis present

## 2014-06-23 DIAGNOSIS — F419 Anxiety disorder, unspecified: Secondary | ICD-10-CM

## 2014-06-23 LAB — RAPID URINE DRUG SCREEN, HOSP PERFORMED
AMPHETAMINES: NOT DETECTED
BARBITURATES: NOT DETECTED
BENZODIAZEPINES: POSITIVE — AB
COCAINE: NOT DETECTED
OPIATES: POSITIVE — AB
TETRAHYDROCANNABINOL: NOT DETECTED

## 2014-06-23 LAB — URINALYSIS, ROUTINE W REFLEX MICROSCOPIC
GLUCOSE, UA: NEGATIVE mg/dL
HGB URINE DIPSTICK: NEGATIVE
KETONES UR: NEGATIVE mg/dL
Leukocytes, UA: NEGATIVE
Nitrite: NEGATIVE
PH: 5.5 (ref 5.0–8.0)
Protein, ur: NEGATIVE mg/dL
SPECIFIC GRAVITY, URINE: 1.034 — AB (ref 1.005–1.030)
Urobilinogen, UA: 0.2 mg/dL (ref 0.0–1.0)

## 2014-06-23 LAB — CBC WITH DIFFERENTIAL/PLATELET
BASOS ABS: 0 10*3/uL (ref 0.0–0.1)
Basophils Relative: 0 % (ref 0–1)
EOS PCT: 4 % (ref 0–5)
Eosinophils Absolute: 0.4 10*3/uL (ref 0.0–0.7)
HEMATOCRIT: 41.4 % (ref 36.0–46.0)
HEMOGLOBIN: 14.4 g/dL (ref 12.0–15.0)
Lymphocytes Relative: 44 % (ref 12–46)
Lymphs Abs: 4.2 10*3/uL — ABNORMAL HIGH (ref 0.7–4.0)
MCH: 31.9 pg (ref 26.0–34.0)
MCHC: 34.8 g/dL (ref 30.0–36.0)
MCV: 91.8 fL (ref 78.0–100.0)
MONO ABS: 0.5 10*3/uL (ref 0.1–1.0)
MONOS PCT: 6 % (ref 3–12)
NEUTROS ABS: 4.5 10*3/uL (ref 1.7–7.7)
Neutrophils Relative %: 46 % (ref 43–77)
Platelets: 348 10*3/uL (ref 150–400)
RBC: 4.51 MIL/uL (ref 3.87–5.11)
RDW: 14 % (ref 11.5–15.5)
WBC: 9.7 10*3/uL (ref 4.0–10.5)

## 2014-06-23 LAB — BASIC METABOLIC PANEL
Anion gap: 16 — ABNORMAL HIGH (ref 5–15)
BUN: 12 mg/dL (ref 6–23)
CALCIUM: 10.1 mg/dL (ref 8.4–10.5)
CHLORIDE: 102 meq/L (ref 96–112)
CO2: 27 meq/L (ref 19–32)
CREATININE: 0.96 mg/dL (ref 0.50–1.10)
GFR calc Af Amer: 79 mL/min — ABNORMAL LOW (ref >90)
GFR calc non Af Amer: 68 mL/min — ABNORMAL LOW (ref >90)
GLUCOSE: 94 mg/dL (ref 70–99)
Potassium: 3.4 mEq/L — ABNORMAL LOW (ref 3.7–5.3)
Sodium: 145 mEq/L (ref 137–147)

## 2014-06-23 LAB — ACETAMINOPHEN LEVEL: Acetaminophen (Tylenol), Serum: <15 ug/mL (ref 10–30)

## 2014-06-23 LAB — SALICYLATE LEVEL: Salicylate Lvl: <2 mg/dL — ABNORMAL LOW (ref 2.8–20.0)

## 2014-06-23 LAB — ETHANOL: Alcohol, Ethyl (B): <11 mg/dL (ref 0–11)

## 2014-06-23 MED ORDER — FUROSEMIDE 20 MG PO TABS
20.0000 mg | ORAL_TABLET | Freq: Every day | ORAL | Status: DC
  Filled 2014-06-23 (×2): qty 1

## 2014-06-23 MED ORDER — CYCLOBENZAPRINE HCL 10 MG PO TABS
10.0000 mg | ORAL_TABLET | Freq: Three times a day (TID) | ORAL | Status: DC | PRN
  Administered 2014-06-24: 10 mg via ORAL
  Filled 2014-06-23: qty 1

## 2014-06-23 MED ORDER — HYDROCODONE-ACETAMINOPHEN 7.5-325 MG PO TABS
1.0000 | ORAL_TABLET | Freq: Every day | ORAL | Status: DC | PRN
  Administered 2014-06-23 – 2014-06-24 (×2): 1 via ORAL
  Filled 2014-06-23 (×2): qty 1

## 2014-06-23 MED ORDER — DIPHENHYDRAMINE-APAP (SLEEP) 25-500 MG PO TABS: 1.0000 | ORAL_TABLET | Freq: Every evening | ORAL | Status: DC | PRN

## 2014-06-23 MED ORDER — IRBESARTAN 300 MG PO TABS
300.0000 mg | ORAL_TABLET | Freq: Every day | ORAL | Status: DC
  Administered 2014-06-24: 300 mg via ORAL
  Filled 2014-06-23 (×2): qty 1

## 2014-06-23 MED ORDER — DIPHENHYDRAMINE HCL 25 MG PO CAPS
25.0000 mg | ORAL_CAPSULE | Freq: Four times a day (QID) | ORAL | Status: DC | PRN
  Administered 2014-06-24: 25 mg via ORAL
  Filled 2014-06-23: qty 1

## 2014-06-23 MED ORDER — ZOLPIDEM TARTRATE 5 MG PO TABS
5.0000 mg | ORAL_TABLET | Freq: Every evening | ORAL | Status: DC | PRN
  Administered 2014-06-23: 5 mg via ORAL
  Filled 2014-06-23: qty 1

## 2014-06-23 MED ORDER — ACETAMINOPHEN 500 MG PO TABS: 500.0000 mg | ORAL_TABLET | Freq: Every evening | ORAL | Status: DC | PRN

## 2014-06-23 MED ORDER — ALPRAZOLAM 1 MG PO TABS
2.0000 mg | ORAL_TABLET | Freq: Three times a day (TID) | ORAL | Status: DC
  Administered 2014-06-23: 2 mg via ORAL
  Filled 2014-06-23: qty 2

## 2014-06-23 MED ORDER — OXYCODONE-ACETAMINOPHEN 5-325 MG PO TABS
1.0000 | ORAL_TABLET | Freq: Once | ORAL | Status: AC
  Administered 2014-06-23: 1 via ORAL
  Filled 2014-06-23: qty 1

## 2014-06-23 NOTE — ED Notes (Addendum)
Patient has been seen at [facility] for this same issue and nothing was founded. She states that she had fibroids and was told that the fibroids had come back after she had her hysterectomy. Patient goes back and forth with chief complaint. According to the chart this is a chronic issue.

## 2014-06-23 NOTE — ED Notes (Signed)
Pt states she suffers from HBP but has no med and no primary

## 2014-06-23 NOTE — ED Notes (Signed)
Patient daughter took her belongings. Patient has used the phone to call family member
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
## 2014-06-23 NOTE — ED Notes (Signed)
Pt with history of chronic abd.pain presents for evaluation after stating her abd. Pain is so unbearable she should take a bottle or handful of Tylenol to just end it all.  Pt very anxious, rates pain as 10 on pain scale.  Pt states blood work was performed but no pelvic exam was done in reference to her abd. Pain. Denies SI or HI, no AV hallucinations.  Pt cooperative at present.  Will continue to monitor for safety.

## 2014-06-23 NOTE — ED Provider Notes (Signed)
CSN: [id number]     Arrival date & time 06/23/14  1401 History   First MD Initiated Contact with Patient 06/23/14 1534     Chief complaint: Abdominal pain.  HPI Patient presented to the emergency room with complaints of chronic abdominal pain.  The patient has had pain in her lower abdomen since 2007. The symptoms all started after having a hysterectomy. Ever since that time she's had chronic lower abdominal pain in her bilateral lower abdomen. The pain is a stinging and burning. She has seen several doctors in the past. She has also been evaluated by Gaspar Cola approximately 2 years ago and was told this may be a pain that she has to deal with for the rest of her life. Patient denies any new symptoms of nausea vomiting diarrhea or fevers. Patient came to the emergency room today because she was having increasing pain. Patient states when the pain becomes more severe she gets very angry and agitated. Patient was afraid she was given have to get admitted to a psychiatric unit because she has had issues with her anxiety in the past. She also was complaining of some anxiety but she denies any complaints of suicidal or homicidal ideation. Past Medical History  Diagnosis Date  . Hypertension   . Depression   . Anxiety   . Chronic abdominal pain   . IBS (irritable bowel syndrome)   . Constipation   . Learning disability    Past Surgical History  Procedure Laterality Date  . Abdominal hysterectomy    . Knee surgery     Family History  Problem Relation Age of Onset  . Diabetes Mother   . Suicidality Mother   . Heart disease Father   . Depression Father    History  Substance Use Topics  . Smoking status: Never Smoker   . Smokeless tobacco: Never Used  . Alcohol Use: No   OB History    No data available     Review of Systems  All other systems reviewed and are negative.     Allergies  Penicillins  Home Medications   Prior to Admission medications   Medication Sig Start Date  End Date Taking? Authorizing Provider  alprazolam Duanne Moron) 2 MG tablet Take 1 tablet (2 mg total) by mouth 3 (three) times daily. 06/15/14  Yes Charlcie Cradle, MD  BENICAR 40 MG tablet Take 1 tablet by mouth daily. 12/17/13  Yes Historical Provider, MD  diphenhydrAMINE (BENADRYL) 25 mg capsule Take 200 mg by mouth every 6 (six) hours as needed for allergies (for allergies).    Yes Historical Provider, MD  diphenhydramine-acetaminophen (TYLENOL PM EXTRA STRENGTH) 25-500 MG TABS Take 1 tablet by mouth at bedtime as needed (insomnia).   Yes Historical Provider, MD  furosemide (LASIX) 20 MG tablet Take 20 mg by mouth daily.    Yes Historical Provider, MD  HYDROcodone-acetaminophen (NORCO) 7.5-325 MG per tablet Take 1 tablet by mouth 5 (five) times daily as needed for moderate pain or severe pain (pain). 06/07/14  Yes Clarene Reamer, MD  zolpidem (AMBIEN CR) 12.5 MG CR tablet Take 1 tablet (12.5 mg total) by mouth at bedtime. 06/15/14  Yes Charlcie Cradle, MD  cyclobenzaprine (FLEXERIL) 10 MG tablet Take 10 mg by mouth 3 (three) times daily as needed. For muscle spasms 03/22/14   Historical Provider, MD  HYDROcodone-ibuprofen (VICOPROFEN) 7.5-200 MG per tablet Take 1 tablet by mouth 5 (five) times daily as needed for moderate pain or severe pain (pain). 06/07/14  Clarene Reamer, MD   BP 131/86 mmHg  Pulse 60  Temp(Src) 98.5 F (36.9 C) (Oral)  Resp 21  SpO2 98% Physical Exam  Constitutional: She appears well-developed and well-nourished. No distress.  HENT:  Head: Normocephalic and atraumatic.  Right Ear: External ear normal.  Left Ear: External ear normal.  Eyes: Conjunctivae are normal. Right eye exhibits no discharge. Left eye exhibits no discharge. No scleral icterus.  Neck: Neck supple. No tracheal deviation present.  Cardiovascular: Normal rate, regular rhythm and intact distal pulses.   Pulmonary/Chest: Effort normal and breath sounds normal. No stridor. No respiratory distress. She has no  wheezes. She has no rales.  Abdominal: Soft. Bowel sounds are normal. She exhibits no distension. There is no tenderness. There is no rebound and no guarding.  Musculoskeletal: She exhibits no edema or tenderness.  Neurological: She is alert. She has normal strength. No cranial nerve deficit (no facial droop, extraocular movements intact, no slurred speech) or sensory deficit. She exhibits normal muscle tone. She displays no seizure activity. Coordination normal.  Skin: Skin is warm and dry. No rash noted.  Psychiatric: She has a normal mood and affect.  Nursing note and vitals reviewed.   ED Course  Procedures (including critical care time) Labs Review Labs Reviewed  CBC WITH DIFFERENTIAL - Abnormal; Notable for the following:    Lymphs Abs 4.2 (*)    All other components within normal limits  BASIC METABOLIC PANEL - Abnormal; Notable for the following:    Potassium 3.4 (*)    GFR calc non Af Amer 68 (*)    GFR calc Af Amer 79 (*)    Anion gap 16 (*)    All other components within normal limits  URINALYSIS, ROUTINE W REFLEX MICROSCOPIC - Abnormal; Notable for the following:    Color, Urine AMBER (*)    Specific Gravity, Urine 1.034 (*)    Bilirubin Urine SMALL (*)    All other components within normal limits  ETHANOL  URINE RAPID DRUG SCREEN (HOSP PERFORMED)   Medications  ALPRAZolam (XANAX) tablet 2 mg (not administered)  irbesartan (AVAPRO) tablet 300 mg (not administered)  cyclobenzaprine (FLEXERIL) tablet 10 mg (not administered)  diphenhydramine-acetaminophen (TYLENOL PM) 25-500 MG per tablet 1 tablet (not administered)  furosemide (LASIX) tablet 20 mg (not administered)  HYDROcodone-acetaminophen (NORCO) 7.5-325 MG per tablet 1 tablet (not administered)  oxyCODONE-acetaminophen (PERCOCET/ROXICET) 5-325 MG per tablet 1 tablet (1 tablet Oral Given 06/23/14 1745)   1830  Pt is crying in pain.  Had just been on the phone and did not appear to be in any discomfort.  MDM    Final diagnoses:  Chronic abdominal pain  Suicidal ideation    The patient initially presented to the emergency room mentioning issues with chronic abdominal pain. However, while she was here her counselor from [place] called and told us that the patient had been threatening to take a whole bottle of Tylenol. When I asked the patient about this she said it was true. She wants the pain to go away. The patient told me that the best thing would be is if she took a whole bottle of medication and never woke up again. The patient if she wanted to harm herself. The patient stated that she was in so much pain she didn't want to wake up again.  Pt placed on involuntary commitment because she wanted to leave the ED.  Will ask Psychiatry to assess.    Dorie Rank, MD 06/23/14 [phone]

## 2014-06-23 NOTE — ED Notes (Addendum)
Pt c/o chronic lower abdominal pain after hysterectomy in 2007, comes to ED today for exacerbation of chronic throbbing abdominal pain. Pt states she does not have a PCP but does have a care coordinator.

## 2014-06-23 NOTE — BH Assessment (Signed)
Assessment completed. Consulted Waylan Boga, NP who recommended that pt be re-assessed in the morning. Pt should not be prescribed any narcotics and if prescribed narcotics pt should be treated on the medical side. [facility] will treat pt for anxiety. Dr. Tomi Bamberger has been informed of the recommendation.

## 2014-06-24 DIAGNOSIS — R1030 Lower abdominal pain, unspecified: Secondary | ICD-10-CM | POA: Diagnosis not present

## 2014-06-24 DIAGNOSIS — F430 Acute stress reaction: Secondary | ICD-10-CM | POA: Insufficient documentation

## 2014-06-24 DIAGNOSIS — F4321 Adjustment disorder with depressed mood: Secondary | ICD-10-CM | POA: Diagnosis present

## 2014-06-24 DIAGNOSIS — F419 Anxiety disorder, unspecified: Secondary | ICD-10-CM

## 2014-06-24 MED ORDER — OLMESARTAN MEDOXOMIL 40 MG PO TABS: 40.0000 mg | ORAL_TABLET | Freq: Every day | ORAL | Status: DC

## 2014-06-24 MED ORDER — HYDROXYZINE HCL 25 MG PO TABS
25.0000 mg | ORAL_TABLET | Freq: Four times a day (QID) | ORAL | Status: DC | PRN
  Administered 2014-06-24: 25 mg via ORAL
  Filled 2014-06-24: qty 1

## 2014-06-24 MED ORDER — HYDROXYZINE HCL 25 MG PO TABS: 25.0000 mg | ORAL_TABLET | Freq: Four times a day (QID) | ORAL | Status: DC | PRN

## 2014-06-24 MED ORDER — ALPRAZOLAM 1 MG PO TABS: 1.0000 mg | ORAL_TABLET | Freq: Three times a day (TID) | ORAL | Status: DC | PRN

## 2014-06-24 MED ORDER — IBUPROFEN 200 MG PO TABS
400.0000 mg | ORAL_TABLET | Freq: Four times a day (QID) | ORAL | Status: DC | PRN
  Administered 2014-06-24: 400 mg via ORAL
  Filled 2014-06-24: qty 2

## 2014-06-24 NOTE — ED Notes (Signed)
Pt sts we are refusing her medical treatment, she sts she will call her attorney and let him know we are holding her "hostage in mental hospital". Attempted to explain to pt multiple times that emergency physician has medically cleared her and she is here for her safety since she expressed suicidal ideations.

## 2014-06-24 NOTE — Discharge Instructions (Signed)
It was our pleasure to provide your ER care today - we hope that you feel better.  Take motrin or aleve as need.  Follow up with primary care doctor in coming week - see referral - call Monday morning to arrange appointment. For mental health issues, follow up with [facility] in the next day - see referral.  Your blood pressure is high, also have them recheck your blood pressure then.  Also see resource guide attached for additional community resources.   Return to ER if worse, new symptoms, fevers, persistent vomiting, medical emergency, severe depression, other concern.     Abdominal Pain Many things can cause abdominal pain. Usually, abdominal pain is not caused by a disease and will improve without treatment. It can often be observed and treated at home. Your health care provider will do a physical exam and possibly order blood tests and X-rays to help determine the seriousness of your pain. However, in many cases, more time must pass before a clear cause of the pain can be found. Before that point, your health care provider may not know if you need more testing or further treatment. HOME CARE INSTRUCTIONS  Monitor your abdominal pain for any changes. The following actions may help to alleviate any discomfort you are experiencing:  Only take over-the-counter or prescription medicines as directed by your health care provider.  Do not take laxatives unless directed to do so by your health care provider.  Try a clear liquid diet (broth, tea, or water) as directed by your health care provider. Slowly move to a bland diet as tolerated. SEEK MEDICAL CARE IF:  You have unexplained abdominal pain.  You have abdominal pain associated with nausea or diarrhea.  You have pain when you urinate or have a bowel movement.  You experience abdominal pain that wakes you in the night.  You have abdominal pain that is worsened or improved by eating food.  You have abdominal pain that is worsened  with eating fatty foods.  You have a fever. SEEK IMMEDIATE MEDICAL CARE IF:   Your pain does not go away within 2 hours.  You keep throwing up (vomiting).  Your pain is felt only in portions of the abdomen, such as the right side or the left lower portion of the abdomen.  You pass bloody or black tarry stools. MAKE SURE YOU:  Understand these instructions.   Will watch your condition.   Will get help right away if you are not doing well or get worse.  Document Released: 05/14/2005 Document Revised: 08/09/2013 Document Reviewed: 04/13/2013 [facility] Patient Information 2015 [place], [place]. This information is not intended to replace advice given to you by your health care provider. Make sure you discuss any questions you have with your health care provider.     Chronic Pain Chronic pain can be defined as pain that is off and on and lasts for 3-6 months or longer. Many things cause chronic pain, which can make it difficult to make a diagnosis. There are many treatment options available for chronic pain. However, finding a treatment that works well for you may require trying various approaches until the right one is found. Many people benefit from a combination of two or more types of treatment to control their pain. SYMPTOMS  Chronic pain can occur anywhere in the body and can range from mild to very severe. Some types of chronic pain include:  Headache.  Low back pain.  Cancer pain.  Arthritis pain.  Neurogenic pain.  This is pain resulting from damage to nerves. People with chronic pain may also have other symptoms such as:  Depression.  Anger.  Insomnia.  Anxiety. DIAGNOSIS  Your health care provider will help diagnose your condition over time. In many cases, the initial focus will be on excluding possible conditions that could be causing the pain. Depending on your symptoms, your health care provider may order tests to diagnose your condition. Some of these tests  may include:   Blood tests.   CT scan.   MRI.   X-rays.   Ultrasounds.   Nerve conduction studies.  You may need to see a specialist.  TREATMENT  Finding treatment that works well may take time. You may be referred to a pain specialist. He or she may prescribe medicine or therapies, such as:   Mindful meditation or yoga.  Shots (injections) of numbing or pain-relieving medicines into the spine or area of pain.  Local electrical stimulation.  Acupuncture.   Massage therapy.   Aroma, color, light, or sound therapy.   Biofeedback.   Working with a physical therapist to keep from getting stiff.   Regular, gentle exercise.   Cognitive or behavioral therapy.   Group support.  Sometimes, surgery may be recommended.  HOME CARE INSTRUCTIONS   Take all medicines as directed by your health care provider.   Lessen stress in your life by relaxing and doing things such as listening to calming music.   Exercise or be active as directed by your health care provider.   Eat a healthy diet and include things such as vegetables, fruits, fish, and lean meats in your diet.   Keep all follow-up appointments with your health care provider.   Attend a support group with others suffering from chronic pain. SEEK MEDICAL CARE IF:   Your pain gets worse.   You develop a new pain that was not there before.   You cannot tolerate medicines given to you by your health care provider.   You have new symptoms since your last visit with your health care provider.  SEEK IMMEDIATE MEDICAL CARE IF:   You feel weak.   You have decreased sensation or numbness.   You lose control of bowel or bladder function.   Your pain suddenly gets much worse.   You develop shaking.  You develop chills.  You develop confusion.  You develop chest pain.  You develop shortness of breath.  MAKE SURE YOU:  Understand these instructions.  Will watch your  condition.  Will get help right away if you are not doing well or get worse. Document Released: 04/26/2002 Document Revised: 04/06/2013 Document Reviewed: 01/28/2013 [facility] Patient Information 2015 [place], [place]. This information is not intended to replace advice given to you by your health care provider. Make sure you discuss any questions you have with your health care provider.    Generalized Anxiety Disorder Generalized anxiety disorder (GAD) is a mental disorder. It interferes with life functions, including relationships, work, and school. GAD is different from normal anxiety, which everyone experiences at some point in their lives in response to specific life events and activities. Normal anxiety actually helps [place] prepare for and get through these life events and activities. Normal anxiety goes away after the event or activity is over.  GAD causes anxiety that is not necessarily related to specific events or activities. It also causes excess anxiety in proportion to specific events or activities. The anxiety associated with GAD is also difficult to control. GAD can  vary from mild to severe. People with severe GAD can have intense waves of anxiety with physical symptoms (panic attacks).  SYMPTOMS The anxiety and worry associated with GAD are difficult to control. This anxiety and worry are related to many life events and activities and also occur more days than not for 6 months or longer. People with GAD also have three or more of the following symptoms (one or more in children):  Restlessness.   Fatigue.  Difficulty concentrating.   Irritability.  Muscle tension.  Difficulty sleeping or unsatisfying sleep. DIAGNOSIS GAD is diagnosed through an assessment by your health care provider. Your health care provider will ask you questions aboutyour mood,physical symptoms, and events in your life. Your health care provider may ask you about your medical history and use of alcohol or  drugs, including prescription medicines. Your health care provider may also do a physical exam and blood tests. Certain medical conditions and the use of certain substances can cause symptoms similar to those associated with GAD. Your health care provider may refer you to a mental health specialist for further evaluation. TREATMENT The following therapies are usually used to treat GAD:   Medication. Antidepressant medication usually is prescribed for long-term daily control. Antianxiety medicines may be added in severe cases, especially when panic attacks occur.   Talk therapy (psychotherapy). Certain types of talk therapy can be helpful in treating GAD by providing support, education, and guidance. A form of talk therapy called cognitive behavioral therapy can teach you healthy ways to think about and react to daily life events and activities.  Stress managementtechniques. These include yoga, meditation, and exercise and can be very helpful when they are practiced regularly. A mental health specialist can help determine which treatment is best for you. Some people see improvement with one therapy. However, other people require a combination of therapies. Document Released: 11/29/2012 Document Revised: 12/19/2013 Document Reviewed: 11/29/2012 [facility] Patient Information 2015 [place], [place]. This information is not intended to replace advice given to you by your health care provider. Make sure you discuss any questions you have with your health care provider.   Hypertension Hypertension, commonly called high blood pressure, is when the force of blood pumping through your arteries is too strong. Your arteries are the blood vessels that carry blood from your heart throughout your body. A blood pressure reading consists of a higher number over a lower number, such as 110/72. The higher number (systolic) is the pressure inside your arteries when your heart pumps. The lower number (diastolic) is the  pressure inside your arteries when your heart relaxes. Ideally you want your blood pressure below 120/80. Hypertension forces your heart to work harder to pump blood. Your arteries may become narrow or stiff. Having hypertension puts you at risk for heart disease, stroke, and other problems.  RISK FACTORS Some risk factors for high blood pressure are controllable. Others are not.  Risk factors you cannot control include:   Race. You may be at higher risk if you are African American.  Age. Risk increases with age.  Gender. Men are at higher risk than women before age 66 years. After age 44, women are at higher risk than men. Risk factors you can control include:  Not getting enough exercise or physical activity.  Being overweight.  Getting too much fat, sugar, calories, or salt in your diet.  Drinking too much alcohol. SIGNS AND SYMPTOMS Hypertension does not usually cause signs or symptoms. Extremely high blood pressure (hypertensive crisis) may cause  headache, anxiety, shortness of breath, and nosebleed. DIAGNOSIS  To check if you have hypertension, your health care provider will measure your blood pressure while you are seated, with your arm held at the level of your heart. It should be measured at least twice using the same arm. Certain conditions can cause a difference in blood pressure between your right and left arms. A blood pressure reading that is higher than normal on one occasion does not mean that you need treatment. If one blood pressure reading is high, ask your health care provider about having it checked again. TREATMENT  Treating high blood pressure includes making lifestyle changes and possibly taking medicine. Living a healthy lifestyle can help lower high blood pressure. You may need to change some of your habits. Lifestyle changes may include:  Following the DASH diet. This diet is high in fruits, vegetables, and whole grains. It is low in salt, red meat, and added  sugars.  Getting at least 2 hours of brisk physical activity every week.  Losing weight if necessary.  Not smoking.  Limiting alcoholic beverages.  Learning ways to reduce stress. If lifestyle changes are not enough to get your blood pressure under control, your health care provider may prescribe medicine. You may need to take more than one. Work closely with your health care provider to understand the risks and benefits. HOME CARE INSTRUCTIONS  Have your blood pressure rechecked as directed by your health care provider.   Take medicines only as directed by your health care provider. Follow the directions carefully. Blood pressure medicines must be taken as prescribed. The medicine does not work as well when you skip doses. Skipping doses also puts you at risk for problems.   Do not smoke.   Monitor your blood pressure at home as directed by your health care provider. SEEK MEDICAL CARE IF:   You think you are having a reaction to medicines taken.  You have recurrent headaches or feel dizzy.  You have swelling in your ankles.  You have trouble with your vision. SEEK IMMEDIATE MEDICAL CARE IF:  You develop a severe headache or confusion.  You have unusual weakness, numbness, or feel faint.  You have severe chest or abdominal pain.  You vomit repeatedly.  You have trouble breathing. MAKE SURE YOU:   Understand these instructions.  Will watch your condition.  Will get help right away if you are not doing well or get worse. Document Released: 08/04/2005 Document Revised: 12/19/2013 Document Reviewed: 05/27/2013 [facility] Patient Information 2015 [place], [place]. This information is not intended to replace advice given to you by your health care provider. Make sure you discuss any questions you have with your health care provider.      Emergency Department Resource Guide 1) Find a Doctor and Pay Out of Pocket Although you won't have to find out who is covered  by your insurance plan, it is a good idea to ask around and get recommendations. You will then need to call the office and see if the doctor you have chosen will accept you as a new patient and what types of options they offer for patients who are self-pay. Some doctors offer discounts or will set up payment plans for their patients who do not have insurance, but you will need to ask so you aren't surprised when you get to your appointment.  2) Contact Your Local Health Department Not all health departments have doctors that can see patients for sick visits, but many do, so  it is worth a call to see if yours does. If you don't know where your local health department is, you can check in your phone book. The CDC also has a tool to help you locate your state's health department, and many state websites also have listings of all of their local health departments.  3) Find a [facility] If your illness is not likely to be very severe or complicated, you may want to try a walk in clinic. These are popping up all over the country in pharmacies, drugstores, and shopping centers. They're usually staffed by nurse practitioners or physician assistants that have been trained to treat common illnesses and complaints. They're usually fairly quick and inexpensive. However, if you have serious medical issues or chronic medical problems, these are probably not your best option.  No Primary Care Doctor: - Call Health Connect at  [phone] - they can help you locate a primary care doctor that  accepts your insurance, provides certain services, etc. - Physician Referral Service- [phone]  Chronic Pain Problems: Organization         Address  Phone   Notes  [facility]  [phone] Patients need to be referred by their primary care doctor.   Medication Assistance: Organization         Address  Phone   Notes  [facility] Medication [facility] [place], [place] [address] [phone] --Must be a resident of [facility] -- Must have NO insurance coverage whatsoever (no Medicaid/ Medicare, etc.) -- The pt. MUST have a primary care doctor that directs their care regularly and follows them in the community   MedAssist  [phone]   [employer]  [phone]    Agencies that provide inexpensive medical care: Organization         Address  Phone   Notes  [place]  [phone]   [facility]    [phone]   [facility] [place], [place] [address] [phone]   [place] [address] [place] [phone]   Planned Parenthood    [phone]   [facility]    [phone]   [place] and [place] [address], [place] Phone:  [phone], Fax:  [phone] Hours of Operation:  9 am - 6 pm, M-F.  Also accepts Medicaid/Medicare and self-pay.  [facility] for [place], [address], [place] Phone: [phone], Fax: [phone]. Hours of Operation:  8:30 am - 5:30 pm, M-F.  Also accepts Medicaid and self-pay.  [facility] [place] [address], [place] Phone: [phone]   [place], [place], [place] [phone], Ext. [phone] Mondays & Thursdays: 7-9 AM.  First 15 patients are seen on a first come, first serve basis.    [place] Providers:  Organization         Address  Phone   Notes  [facility] [address], [place] [phone] Also accepts self-pay patients.  [employer], [place]  [phone]   [place], [address], [place] [phone]   [place] [address], [place] [phone]   Lucianne Lei [address]  [address], [place]   [phone] Only accepts [place] patients after they have their name applied to their card.   Self-Pay (no insurance) in [facility]:  Organization         Address  Phone   Notes  Sickle Cell Patients, [facility] [place] [phone]   [facility] [employer] [phone]   [facility] [place]  [place], [place], [place] [phone]   [facility]/Dr. Osei-Bonsu  [address] [place] [address] [place] or [address], [place] [phone] Phone number for both [place] and [place] locations is the same.  Urgent Medical and [facility] [address], [place] [phone]   [facility] [address] [place] or [address] [phone]   [facility] [address] [place] [phone], phone; [phone], fax Sees patients 1st and 3rd Saturday of every month.  Must not qualify for public or private insurance (i.e. Medicaid, Medicare, [place] Health Choice, Veterans' Benefits)  Household income should be no more than 200% of the poverty level The clinic cannot treat you if you are pregnant or think you are pregnant  Sexually transmitted diseases are not treated at the clinic.    Dental Care: Organization         Address  Phone  Notes  [facility] [place] [phone] Accepts children up to age 45 who are enrolled in [place] or [place]; pregnant women with a Medicaid card; and children who have applied for Medicaid or [place] Health Choice, but were declined, whose parents can pay a reduced fee at time of service.  [facility] Department of [facility]  [address], [place] [phone] Accepts children up to age 64 who are enrolled  in [place] or [place]; pregnant women with a Medicaid card; and children who have applied for Medicaid or [place] Health Choice, but were declined, whose parents can pay a reduced fee at time of service.  [place] Adult Dental Access PROGRAM  [place] [phone] Patients are seen by appointment only. Walk-ins are not accepted. [place] will see patients 84 years of age and older. Monday - Tuesday (8am-5pm) Most Wednesdays (8:30-5pm) $30 per visit, cash only  [facility] Adult Dental Access PROGRAM  [address], [facility] [phone] Patients are seen by appointment only. Walk-ins are not accepted. [place] will see patients 25 years of age and older. One Wednesday Evening (Monthly: Volunteer Based).  $30 per visit, cash only  [place]  [phone] for adults; Children under age 73, call Graduate Pediatric Dentistry at [phone]. Children aged 40-14, please call [phone] to request a pediatric application.  Dental services are provided in all areas of dental care including fillings, crowns and bridges, complete and partial dentures, implants, gum treatment, root canals, and extractions. Preventive care is also provided. Treatment is provided to both adults and children. Patients are selected via a lottery and there is often a waiting list.   [facility] [address], [place]  [phone] [email or web address]   [facility] [address] [place], [place] [phone], Ext. [phone] Second and Fourth Thursday  of each month, opens at 6:30 AM; Clinic ends at 9 AM.  Patients are seen on a first-come first-served basis, and a limited number are seen during each clinic.   [facility]  [address] [place] Danker [place], [place] [phone]   Eligibility Requirements You must have lived in [place], [place], or [place] counties for at least the last three months.   You cannot be  eligible for state or federal sponsored [employer], including [employer], [place], or [employer].   You generally cannot be eligible for healthcare insurance through your employer.    How to apply: Eligibility screenings are held every Tuesday and Wednesday afternoon from 1:00 pm until 4:00 pm. You do not need an appointment for the interview!  [facility] [address], [place], [place]   [place]  [place] Department  [place]  [phone]    Behavioral Health Resources in the Community: Intensive Outpatient Programs Organization         Address  Phone  Notes  [place]. [address], [place], [place] [phone]   [facility] Outpatient [address] [place], [place]   ADS: Alcohol & Drug Svcs [address] [place], [place]   [place] [address],  [place], [place] or [phone]   Substance Abuse Resources Organization         Address  Phone  Notes  Alcohol and Drug Services  [phone]   [place]  [phone]   The [place]   Chinita Pester  [phone]   Residential & Outpatient Substance Abuse Program  [phone]   Psychological Services Organization         Address  Phone  Notes  [facility] [place]  [place]  [phone]   [facility] [address] [place] or [phone]    Mobile Crisis Teams Organization         Address  Phone  Notes  [facility], Mobile Crisis Care Unit  [phone]   [facility]  [address] [place], [place]   [place] Levels [address], [place] [phone]    Self-Help/Support Groups Organization          Address  Phone             Notes  [place]. of [place] - variety of support groups  [place] Call for more information  Narcotics Anonymous (NA), [facility] [address], [employer] [place]  2 meetings at this location   Special educational needs teacher         Address  Phone  Notes  ASAP Residential Treatment Study [place],    [place]  [phone]   [facility]  [address] [place] [address], [place], [place]   [place], [place] [phone] Admissions: 8am-3pm M-F  Incentives Substance [place] [address],    [place], [place] [phone]   [facility] [address] [place]adene Pierini [place], [place]   The [place].,  [place], [place] - Intensive Outpatient [address] Kristeen Mans [address], [place], [place]   [facility] ([place],  [place], [place] [phone] or [phone]   [facility]idential Treatment Services (RTS) [address], [place], [place] Accepts Medicaid  [facility] [place] [address],  [place] [phone] Substance Abuse/Addiction Treatment   [facility] Organization         Address  Phone  Notes  [facility] Human Services  [phone]   Domenic Schwab, PhD [address] [place] [place], [place]   [phone] or [phone]   [place], [place] [id number]   [address], [place], [place] [phone] Insurance/Medicaid/sponsorship through [facility] [address] [place]                                    [place], [place] [phone] [place] [address], [place] [phone]    Dr. Adele Schilder  [phone]   [facility] Dept[facility] 1) [address] [place] 2) [place] 3)  [address], [place] [phone]  [phone]   [place] [phone] or [phone] (After Hours)

## 2014-06-24 NOTE — ED Notes (Signed)
Pt came to desk asking for updates, she sts she is in a lot of pain and "nobody is giving me pain medicine or anything for anxiety. I am thinking about stabbing somebody in the eye with toothbrush, I'm communicating that to you right now, so don't hold it against me if I attack somebody out here."
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
## 2014-06-24 NOTE — ED Notes (Signed)
Pt now c/o "feeling sick and you're denying me treatment", will notify EDP.

## 2014-06-24 NOTE — ED Notes (Signed)
Social worker to rescind IVC papers

## 2014-06-24 NOTE — [facility] Suicide Risk Assessment (Signed)
Suicide Risk Assessment  Discharge Assessment     Demographic Factors:  NA  Total Time spent with patient: 45 minutes  Psychiatric Specialty Exam:     Blood pressure 156/109, pulse 105, temperature 98.4 F (36.9 C), temperature source Oral, resp. rate 18, SpO2 100 %.There is no weight on file to calculate BMI.  General Appearance: Casual  Eye Contact::  Good  Speech:  Normal Rate  Volume:  Normal  Mood:  Anxious  Affect:  Congruent  Thought Process:  Coherent  Orientation:  Full (Time, Place, and Person)  Thought Content:  WDL  Suicidal Thoughts:  No  Homicidal Thoughts:  No  Memory:  Immediate;   Good Recent;   Good Remote;   Good  Judgement:  Fair  Insight:  Lacking  Psychomotor Activity:  Normal  Concentration:  Good  Recall:  Good  Fund of Knowledge:Good  Language: Good  Akathisia:  No  Handed:  Right  AIMS (if indicated):     Assets:  Housing Leisure Time Physical Health Resilience Social Support Transportation  Sleep:      Musculoskeletal: Strength & Muscle Tone: within normal limits Gait & Station: normal Patient leans: N/A  Mental Status Per Nursing Assessment::   On Admission:   abdominal pain, anxiety  Current Mental Status by Physician: NA  Loss Factors: NA  Historical Factors: NA  Risk Reduction Factors:   Sense of responsibility to family, Positive social support and Positive coping skills or problem solving skills  Continued Clinical Symptoms:  Anxiety  Cognitive Features That Contribute To Risk:  None   Suicide Risk:  Minimal: No identifiable suicidal ideation.  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 Hypertension   . Depression   .  Anxiety   . Chronic abdominal pain   . IBS (irritable bowel syndrome)   . Constipation   . Learning disability    AXIS IV:  other psychosocial or environmental problems and problems related to social environment AXIS V:  61-70 mild symptoms  Plan Of Care/Follow-up recommendations:  Activity:  as tolerated Diet:  heart healthy diet  Is patient on multiple antipsychotic therapies at discharge:  No   Has Patient had three or more failed trials of antipsychotic monotherapy by history:  No  Recommended Plan for Multiple Antipsychotic Therapies: NA    LORD, JAMISON, PMH-NP  06/24/2014, 3:38 PM

## 2014-06-24 NOTE — ED Notes (Signed)
Dr Ashok Cordia in to evaluate pt, pt is refusing to got her room and is talking to Dr Ashok Cordia in hallway

## 2014-06-24 NOTE — ED Provider Notes (Signed)
Called to psych ED as pt requested to speak to MD.   Pt indicates she had come to ED w her chronic abd pain, and that as was frustrated with not getting narcotic pain medication she became upset and made comments about suicide.  Pt indicates she has no desire or plan to harm self.  She indicates recently pcp would not longer supply her w pain med rx, and that she needs a new primary care doctor.  Pt states she is more anxious and upset about being in the milieu of the psych ED, and she requests d/c to home.  Pt is alert, oriented. No delusions or hallucinations.  Pt expresses no thoughts of harm to self or others, and is agreeable to being referred to outpatient medical and beh health follow up.  Will provide pt w resource guide as well.   Pt is afeb. abd soft nt.   Pt currently appears stable for d/c, and verbally contracts for safety.      Mirna Mires, MD 06/24/14 [phone]

## 2014-06-24 NOTE — ED Notes (Signed)
Patient complaining about not getting her anxiety and pain medication.  Patient complains of severe abdominal pain.  She has had a complete workup in the ED with no indication of a problem.  She is a frequent visitor to the ED with the same somatic complaints and always requests pain medication.  This morning, she refused to speak with the MD and treatment team and states, "I want you out of my room."  She later told staff, "you are not treating my pain.  I need to go to medical.  What am I doing here when I'm here for medical?  I take ambian, xanax and vicodin."  Patient was informed that would not be getting those medications here because of the addiction potential of these medications.  Informed patient that she will possibly be discharged today.  She again became upset because she wants "medical attention."  Patient states, "how are you going to discharge me?  I'm suicidal!"  Patient states she gets her pain medications from the ED.  She has no primary care physician or pain specialist.

## 2014-06-28 ENCOUNTER — Telehealth: Payer: Self-pay | Admitting: Gastroenterology

## 2014-06-28 NOTE — Telephone Encounter (Signed)
Left message to call back  

## 2014-06-29 NOTE — Telephone Encounter (Signed)
Pt has been scheduled to see Cecille Rubin on 07/03/14 2 pm Tammy will notify pt

## 2014-07-03 ENCOUNTER — Ambulatory Visit: Payer: Self-pay | Admitting: Physician Assistant

## 2014-07-03 ENCOUNTER — Telehealth: Payer: Self-pay | Admitting: *Deleted

## 2014-07-03 NOTE — Telephone Encounter (Signed)
This patient did not show for her appointment today. Per Cecille Rubin HVozdovic PA, I called the patient on her home number and left a message.  I advised her she had spoken to [place] here and Chong Sicilian made her an appointment to see Cecille Rubin the PA.  I left a message for her to please call us if she is needing another appointment.

## 2014-11-29 ENCOUNTER — Other Ambulatory Visit: Payer: Self-pay | Admitting: Internal Medicine

## 2014-11-29 DIAGNOSIS — R109 Unspecified abdominal pain: Secondary | ICD-10-CM

## 2014-12-06 ENCOUNTER — Other Ambulatory Visit: Payer: Self-pay

## 2015-01-09 ENCOUNTER — Ambulatory Visit
Admission: RE | Admit: 2015-01-09 | Discharge: 2015-01-09 | Disposition: A | Discharge status: Home / Self Care | Payer: Medicare Other | Source: Ambulatory Visit | Attending: Internal Medicine | Admitting: Internal Medicine

## 2015-01-09 DIAGNOSIS — R109 Unspecified abdominal pain: Secondary | ICD-10-CM

## 2015-03-01 ENCOUNTER — Encounter (HOSPITAL_COMMUNITY): Payer: Self-pay

## 2015-03-01 ENCOUNTER — Emergency Department (HOSPITAL_COMMUNITY): Payer: Medicare Other

## 2015-03-01 ENCOUNTER — Emergency Department (HOSPITAL_COMMUNITY)
Admission: EM | Admit: 2015-03-01 | Discharge: 2015-03-02 | Disposition: A | Discharge status: Home / Self Care | Payer: Medicare Other | Attending: Emergency Medicine | Admitting: Emergency Medicine

## 2015-03-01 DIAGNOSIS — I10 Essential (primary) hypertension: Secondary | ICD-10-CM | POA: Insufficient documentation

## 2015-03-01 DIAGNOSIS — Z79899 Other long term (current) drug therapy: Secondary | ICD-10-CM | POA: Insufficient documentation

## 2015-03-01 DIAGNOSIS — K5900 Constipation, unspecified: Secondary | ICD-10-CM

## 2015-03-01 DIAGNOSIS — F819 Developmental disorder of scholastic skills, unspecified: Secondary | ICD-10-CM | POA: Diagnosis not present

## 2015-03-01 DIAGNOSIS — G8929 Other chronic pain: Secondary | ICD-10-CM | POA: Diagnosis not present

## 2015-03-01 DIAGNOSIS — Z880 Allergy status to penicillin: Secondary | ICD-10-CM | POA: Insufficient documentation

## 2015-03-01 DIAGNOSIS — F419 Anxiety disorder, unspecified: Secondary | ICD-10-CM | POA: Diagnosis not present

## 2015-03-01 DIAGNOSIS — F329 Major depressive disorder, single episode, unspecified: Secondary | ICD-10-CM | POA: Insufficient documentation

## 2015-03-01 LAB — URINALYSIS, ROUTINE W REFLEX MICROSCOPIC
BILIRUBIN URINE: NEGATIVE
Glucose, UA: NEGATIVE mg/dL
HGB URINE DIPSTICK: NEGATIVE
Ketones, ur: NEGATIVE mg/dL
LEUKOCYTES UA: NEGATIVE
Nitrite: NEGATIVE
PH: 7 (ref 5.0–8.0)
Protein, ur: 30 mg/dL — AB
SPECIFIC GRAVITY, URINE: 1.024 (ref 1.005–1.030)
UROBILINOGEN UA: 1 mg/dL (ref 0.0–1.0)

## 2015-03-01 LAB — COMPREHENSIVE METABOLIC PANEL
ALT: 30 U/L (ref 14–54)
AST: 28 U/L (ref 15–41)
Albumin: 4.8 g/dL (ref 3.5–5.0)
Alkaline Phosphatase: 66 U/L (ref 38–126)
Anion gap: 10 (ref 5–15)
BUN: 7 mg/dL (ref 6–20)
CO2: 30 mmol/L (ref 22–32)
CREATININE: 0.85 mg/dL (ref 0.44–1.00)
Calcium: 9.7 mg/dL (ref 8.9–10.3)
Chloride: 105 mmol/L (ref 101–111)
GFR calc Af Amer: >60 mL/min (ref >60)
GFR calc non Af Amer: >60 mL/min (ref >60)
Glucose, Bld: 154 mg/dL — ABNORMAL HIGH (ref 65–99)
Potassium: 2.8 mmol/L — ABNORMAL LOW (ref 3.5–5.1)
Sodium: 145 mmol/L (ref 135–145)
TOTAL PROTEIN: 8.4 g/dL — AB (ref 6.5–8.1)
Total Bilirubin: 0.8 mg/dL (ref 0.3–1.2)

## 2015-03-01 LAB — URINE MICROSCOPIC-ADD ON

## 2015-03-01 LAB — CBC
HCT: 38.8 % (ref 36.0–46.0)
Hemoglobin: 13.3 g/dL (ref 12.0–15.0)
MCH: 31.4 pg (ref 26.0–34.0)
MCHC: 34.3 g/dL (ref 30.0–36.0)
MCV: 91.7 fL (ref 78.0–100.0)
PLATELETS: 332 10*3/uL (ref 150–400)
RBC: 4.23 MIL/uL (ref 3.87–5.11)
RDW: 13.5 % (ref 11.5–15.5)
WBC: 9.8 10*3/uL (ref 4.0–10.5)

## 2015-03-01 LAB — LIPASE, BLOOD: Lipase: 17 U/L — ABNORMAL LOW (ref 22–51)

## 2015-03-01 MED ORDER — FENTANYL CITRATE (PF) 100 MCG/2ML IJ SOLN
50.0000 ug | Freq: Once | INTRAMUSCULAR | Status: AC
  Administered 2015-03-01: 50 ug via INTRAVENOUS
  Filled 2015-03-01: qty 2

## 2015-03-01 MED ORDER — ONDANSETRON HCL 4 MG/2ML IJ SOLN
4.0000 mg | Freq: Once | INTRAMUSCULAR | Status: AC
  Administered 2015-03-01: 4 mg via INTRAVENOUS
  Filled 2015-03-01: qty 2

## 2015-03-01 MED ORDER — SODIUM CHLORIDE 0.9 % IV BOLUS (SEPSIS)
1000.0000 mL | Freq: Once | INTRAVENOUS | Status: AC
  Administered 2015-03-01: 1000 mL via INTRAVENOUS

## 2015-03-01 MED ORDER — HYDROMORPHONE HCL 1 MG/ML IJ SOLN
1.0000 mg | Freq: Once | INTRAMUSCULAR | Status: AC
  Administered 2015-03-01: 1 mg via INTRAVENOUS
  Filled 2015-03-01: qty 1

## 2015-03-01 MED ORDER — DOCUSATE SODIUM 100 MG PO CAPS
100.0000 mg | ORAL_CAPSULE | Freq: Once | ORAL | Status: AC
  Administered 2015-03-01: 100 mg via ORAL
  Filled 2015-03-01: qty 1

## 2015-03-01 MED ORDER — MAGNESIUM CITRATE PO SOLN
1.0000 | Freq: Once | ORAL | Status: AC
  Administered 2015-03-01: 1 via ORAL
  Filled 2015-03-01: qty 296

## 2015-03-01 MED ORDER — BISACODYL 10 MG RE SUPP: 10.0000 mg | RECTAL | Status: DC | PRN

## 2015-03-01 MED ORDER — DOCUSATE SODIUM 100 MG PO CAPS: 100.0000 mg | ORAL_CAPSULE | Freq: Two times a day (BID) | ORAL | Status: DC

## 2015-03-01 MED ORDER — POTASSIUM CHLORIDE 10 MEQ/100ML IV SOLN
10.0000 meq | INTRAVENOUS | Status: AC
  Administered 2015-03-01 (×2): 10 meq via INTRAVENOUS
  Filled 2015-03-01: qty 100

## 2015-03-01 MED ORDER — LACTULOSE 10 GM/15ML PO SOLN: 20.0000 g | Freq: Three times a day (TID) | ORAL | Status: DC

## 2015-03-01 NOTE — ED Notes (Signed)
Pt is aware urine is needed 

## 2015-03-01 NOTE — ED Notes (Signed)
Pt states no bm x 2 weeks.  Normal to have constipation.  Does not take meds.  Pt states abdominal pain with vomiting since Tuesday.  Pt states abdomen feels distended.  Pt not passing gas.

## 2015-03-01 NOTE — ED Provider Notes (Signed)
CSN: [id number]     Arrival date & time 03/01/15  1343 History   First MD Initiated Contact with Patient 03/01/15 1658     Chief Complaint  Patient presents with  . Constipation  . Emesis  . Abdominal Pain     (Consider location/radiation/quality/duration/timing/severity/associated sxs/prior Treatment) HPI Patient presents to the emergency department with obstipation with abdominal pain.  Patient states her abdominal pain is been ongoing for many, many years.  She states his constipations gotten worse over the last week.  She states her last bowel movement was 9 days ago.  Patient states that she does not have any nausea, vomiting, weakness, dizziness, headache, blurred vision, back pain, fever, cough, runny nose, sore throat, chest pain, shortness of breath, diarrhea, or syncope.  The patient states that nothing seems make her condition better or worse.  Stay.  She did not take any medications prior to arrival for her symptoms Past Medical History  Diagnosis Date  . Hypertension   . Depression   . Anxiety   . Chronic abdominal pain   . IBS (irritable bowel syndrome)   . Constipation   . Learning disability    Past Surgical History  Procedure Laterality Date  . Abdominal hysterectomy    . Knee surgery     Family History  Problem Relation Age of Onset  . Diabetes Mother   . Suicidality Mother   . Heart disease Father   . Depression Father    History  Substance Use Topics  . Smoking status: Never Smoker   . Smokeless tobacco: Never Used  . Alcohol Use: No   OB History    No data available     Review of Systems   All other systems negative except as documented in the HPI. All pertinent positives and negatives as reviewed in the HPI. Allergies  Penicillins  Home Medications   Prior to Admission medications   Medication Sig Start Date End Date Taking? Authorizing Provider  alprazolam Duanne Moron) 2 MG tablet Take 2 mg by mouth 3 (three) times daily as needed for  anxiety.  01/30/15  Yes Historical Provider, MD  diphenhydrAMINE (BENADRYL) 25 mg capsule Take 200 mg by mouth every 6 (six) hours as needed for allergies (for allergies).    Yes Historical Provider, MD  diphenhydramine-acetaminophen (TYLENOL PM EXTRA STRENGTH) 25-500 MG TABS Take 2 tablets by mouth at bedtime as needed (insomnia).    Yes Historical Provider, MD  furosemide (LASIX) 20 MG tablet Take 20 mg by mouth daily. 02/26/15  Yes Historical Provider, MD  olmesartan (BENICAR) 40 MG tablet Take 1 tablet (40 mg total) by mouth daily. 06/24/14  Yes Patrecia Pour, NP  oxcarbazepine (TRILEPTAL) 600 MG tablet Take 600 mg by mouth 2 (two) times daily. 02/19/15  Yes Historical Provider, MD  zolpidem (AMBIEN CR) 12.5 MG CR tablet Take 12.5 mg by mouth at bedtime as needed for sleep.  01/30/15  Yes Historical Provider, MD  bisacodyl (DULCOLAX) 10 MG suppository Place 1 suppository (10 mg total) rectally as needed for moderate constipation. 03/01/15   Dalia Heading, PA-C  docusate sodium (COLACE) 100 MG capsule Take 1 capsule (100 mg total) by mouth every 12 (twelve) hours. 03/01/15   Dalia Heading, PA-C  hydrOXYzine (ATARAX/VISTARIL) 25 MG tablet Take 1 tablet (25 mg total) by mouth every 6 (six) hours as needed for anxiety. Patient not taking: Reported on 03/01/2015 06/24/14   Patrecia Pour, NP  lactulose [facility]) 10 GM/15ML solution Take 30 mLs (20  g total) by mouth 3 (three) times daily. 03/01/15   Tita Terhaar, PA-C   BP 195/107 mmHg  Pulse 84  Resp 18  SpO2 99% Physical Exam  Constitutional: She is oriented to person, place, and time. She appears well-developed and well-nourished. No distress.  HENT:  Head: Normocephalic and atraumatic.  Mouth/Throat: Oropharynx is clear and moist.  Eyes: Pupils are equal, round, and reactive to light.  Neck: Normal range of motion. Neck supple.  Cardiovascular: Normal rate, regular rhythm and normal heart sounds.  Exam reveals no gallop and no  friction rub.   No murmur heard. Pulmonary/Chest: Effort normal and breath sounds normal. No respiratory distress.  Neurological: She is alert and oriented to person, place, and time. She exhibits normal muscle tone. Coordination normal.  Skin: Skin is warm and dry. No rash noted. No erythema.  Psychiatric: She has a normal mood and affect. Her behavior is normal.  Nursing note and vitals reviewed.   ED Course  Procedures (including critical care time) Labs Review Labs Reviewed  LIPASE, BLOOD - Abnormal; Notable for the following:    Lipase 17 (*)    All other components within normal limits  COMPREHENSIVE METABOLIC PANEL - Abnormal; Notable for the following:    Potassium 2.8 (*)    Glucose, Bld 154 (*)    Total Protein 8.4 (*)    All other components within normal limits  URINALYSIS, ROUTINE W REFLEX MICROSCOPIC (NOT AT [facility]) - Abnormal; Notable for the following:    Protein, ur 30 (*)    All other components within normal limits  URINE MICROSCOPIC-ADD ON - Abnormal; Notable for the following:    Squamous Epithelial / LPF FEW (*)    All other components within normal limits  CBC    Imaging Review Dg Abd Acute W/chest  03/01/2015   CLINICAL DATA:  49 year old female with nausea and vomiting  EXAM: DG ABDOMEN ACUTE W/ 1V CHEST  COMPARISON:  None.  FINDINGS: Moderate stool throughout the colon. There is no evidence of dilated bowel loops or free intraperitoneal air. No radiopaque calculi or other significant radiographic abnormality is seen. Mild cardiomegaly. Both lungs are clear.  IMPRESSION: Constipation. No evidence of bowel obstruction. No radiopaque calculi.   Electronically Signed   By: Anner Crete M.D.   On: 03/01/2015 18:52   Patient will be treated for constipation.  Told to return here as needed.  Also advised her to follow-up with her primary care Dr. for recheck  MDM   Final diagnoses:  Constipation, unspecified constipation type       Dalia Heading,  PA-C 03/05/15 0113  Noemi Chapel, MD 03/06/15 [phone]

## 2015-03-01 NOTE — ED Notes (Signed)
Patient transported to X-ray 

## 2015-03-01 NOTE — Discharge Instructions (Signed)
Return here as needed.  Follow-up with your primary care doctor, increase your fluid intake °

## 2015-03-01 NOTE — ED Notes (Signed)
Pt to be discharged at 0200 due to dilaudid given at 2200.

## 2015-03-01 NOTE — ED Notes (Addendum)
Pt is not ready to sign for dc although instruction reviewed. Questions r/t dc were denied. Pt will be driving herself home

## 2015-03-01 NOTE — ED Notes (Signed)
Pt stated that she feels she needs to relax. P.A. At bedside and plans to prescribe med for pt. Instructions reviewed.

## 2015-05-08 ENCOUNTER — Ambulatory Visit (INDEPENDENT_AMBULATORY_CARE_PROVIDER_SITE_OTHER): Payer: Medicare Other | Admitting: Family Medicine

## 2015-05-08 VITALS — BP 128/76 | HR 93 | Temp 98.6°F | Resp 16 | Ht 66.0 in | Wt 223.8 lb

## 2015-05-08 DIAGNOSIS — L749 Eccrine sweat disorder, unspecified: Secondary | ICD-10-CM

## 2015-05-08 DIAGNOSIS — M791 Myalgia, unspecified site: Secondary | ICD-10-CM

## 2015-05-08 DIAGNOSIS — R109 Unspecified abdominal pain: Secondary | ICD-10-CM

## 2015-05-08 DIAGNOSIS — Z113 Encounter for screening for infections with a predominantly sexual mode of transmission: Secondary | ICD-10-CM

## 2015-05-08 DIAGNOSIS — R5383 Other fatigue: Secondary | ICD-10-CM | POA: Diagnosis not present

## 2015-05-08 DIAGNOSIS — Z202 Contact with and (suspected) exposure to infections with a predominantly sexual mode of transmission: Secondary | ICD-10-CM

## 2015-05-08 DIAGNOSIS — K5900 Constipation, unspecified: Secondary | ICD-10-CM

## 2015-05-08 DIAGNOSIS — I10 Essential (primary) hypertension: Secondary | ICD-10-CM

## 2015-05-08 DIAGNOSIS — L750 Bromhidrosis: Secondary | ICD-10-CM

## 2015-05-08 DIAGNOSIS — R932 Abnormal findings on diagnostic imaging of liver and biliary tract: Secondary | ICD-10-CM

## 2015-05-08 DIAGNOSIS — F1310 Sedative, hypnotic or anxiolytic abuse, uncomplicated: Secondary | ICD-10-CM

## 2015-05-08 DIAGNOSIS — Z79899 Other long term (current) drug therapy: Secondary | ICD-10-CM | POA: Diagnosis not present

## 2015-05-08 DIAGNOSIS — J309 Allergic rhinitis, unspecified: Secondary | ICD-10-CM

## 2015-05-08 DIAGNOSIS — G8929 Other chronic pain: Secondary | ICD-10-CM | POA: Diagnosis not present

## 2015-05-08 DIAGNOSIS — R7309 Other abnormal glucose: Secondary | ICD-10-CM

## 2015-05-08 DIAGNOSIS — F1320 Sedative, hypnotic or anxiolytic dependence, uncomplicated: Secondary | ICD-10-CM

## 2015-05-08 LAB — POCT WET + KOH PREP
Trich by wet prep: ABSENT
Yeast by KOH: ABSENT
Yeast by wet prep: ABSENT

## 2015-05-08 LAB — POCT URINALYSIS DIP (MANUAL ENTRY)
Bilirubin, UA: NEGATIVE
Glucose, UA: NEGATIVE
LEUKOCYTES UA: NEGATIVE
Nitrite, UA: NEGATIVE
PH UA: 5.5
RBC UA: NEGATIVE
Spec Grav, UA: 1.025
Urobilinogen, UA: 0.2

## 2015-05-08 LAB — POCT CBC
Granulocyte percent: 57.7 %G (ref 37–80)
HCT, POC: 42.2 % (ref 37.7–47.9)
Hemoglobin: 13.6 g/dL (ref 12.2–16.2)
LYMPH, POC: 3 (ref 0.6–3.4)
MCH, POC: 30 pg (ref 27–31.2)
MCHC: 32.1 g/dL (ref 31.8–35.4)
MCV: 93.3 fL (ref 80–97)
MID (cbc): 0.3 (ref 0–0.9)
MPV: 8.1 fL (ref 0–99.8)
POC Granulocyte: 4.4 (ref 2–6.9)
POC LYMPH %: 38.6 % (ref 10–50)
POC MID %: 3.7 % (ref 0–12)
Platelet Count, POC: 331 10*3/uL (ref 142–424)
RBC: 4.52 M/uL (ref 4.04–5.48)
RDW, POC: 13.5 %
WBC: 7.7 10*3/uL (ref 4.6–10.2)

## 2015-05-08 LAB — COMPREHENSIVE METABOLIC PANEL
ALK PHOS: 79 U/L (ref 33–130)
ALT: 21 U/L (ref 6–29)
AST: 16 U/L (ref 10–35)
Albumin: 4.9 g/dL (ref 3.6–5.1)
BUN: 13 mg/dL (ref 7–25)
CO2: 30 mmol/L (ref 20–31)
Calcium: 10 mg/dL (ref 8.6–10.4)
Chloride: 100 mmol/L (ref 98–110)
Creat: 0.92 mg/dL (ref 0.50–1.05)
Glucose, Bld: 97 mg/dL (ref 65–99)
Potassium: 3.6 mmol/L (ref 3.5–5.3)
Sodium: 143 mmol/L (ref 135–146)
TOTAL PROTEIN: 7.3 g/dL (ref 6.1–8.1)
Total Bilirubin: 0.5 mg/dL (ref 0.2–1.2)

## 2015-05-08 LAB — MAGNESIUM: MAGNESIUM: 2 mg/dL (ref 1.5–2.5)

## 2015-05-08 LAB — CK: CK TOTAL: 168 U/L (ref 7–177)

## 2015-05-08 LAB — POCT GLYCOSYLATED HEMOGLOBIN (HGB A1C): Hemoglobin A1C: 5.5

## 2015-05-08 MED ORDER — CYCLOBENZAPRINE HCL 10 MG PO TABS: 10.0000 mg | ORAL_TABLET | Freq: Every day | ORAL | Status: DC

## 2015-05-08 MED ORDER — POLYETHYLENE GLYCOL 3350 17 GM/SCOOP PO POWD: 17.0000 g | Freq: Every day | ORAL | Status: DC

## 2015-05-08 MED ORDER — CETIRIZINE HCL 10 MG PO TABS: 10.0000 mg | ORAL_TABLET | Freq: Every day | ORAL | Status: DC

## 2015-05-08 MED ORDER — METHOCARBAMOL 500 MG PO TABS: 500.0000 mg | ORAL_TABLET | Freq: Four times a day (QID) | ORAL | Status: DC | PRN

## 2015-05-08 NOTE — Progress Notes (Addendum)
Subjective:  This chart was scribed for Dorothy Cheadle, MD by Dorothy Alvarez, Medical Scribe. This patient was seen in Room 12 and the patient's care was started at 2:13 PM.   Patient ID: Dorothy Alvarez, female    DOB: [birth year], 49 y.o.   MRN: [id number]  Chief Complaint  Patient presents with  . Spasms    back  . Abdominal Pain    x had for years     HPI HPI Comments: Dorothy Alvarez is a 49 y.o. female who presents to [facility] complaining of myalgia.  Pt has a PMHx of abdominal pain as well as mood disorders, bzd and opiate dependence and abuse.  Pt has a noted history of chronic abdominal pain as well as repeated request for opiate. At ER visit she threatened to become suicidal if not given pain medications. She tried to change her PCP when he stopped prescribing her pain medications. She retracted her suicidal threats when she was offered admission to a psychiatric clinic. Pt has had abdominal pain for many years, has seen GI, worsened with constipation, which is also chronic. She was seen in the ER 2 months ago. Pt did not show up for prior scheduled GI appointments. Pt has abdominal X-ray 2 months ago showing constipation. [place] of abdomen 4 months ago showing liver disease, unsure if fatty liver vs. hepatocellular; no gallstones, no kidney stones.   Today, pt reports a constant abdominal pain and muscle spasms all over body. She notes stiffness and pain especially when standing up and sitting down. She also notes ear pain and eye watering onset one month ago. She reports taking Benadryl and Tylenol for the symptoms. She notes that the severity of pain cause her to be forgetful. Pt states that she present with no urinary symptoms. Pt states that she has her constipation under control by taking Dulcolax and drinking tea. She indicates that she has previously seen GI specialist, however she does not recall when. Pt note that she is compliant with taking Benicar that was prescribed  with a cardiologist.   Patient Active Problem List   Diagnosis Date Noted  . Adjustment disorder with depressed mood 06/24/2014  . Stress reaction causing mixed disturbance of emotion and conduct   . Opiate dependence, continuous 06/16/2014  . 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 GAD (generalized anxiety disorder) 06/16/2014  . Major depressive disorder, recurrent, severe without psychotic features 06/16/2014  . Major depressive disorder, recurrent episode, moderate 04/13/2014  . Insomnia 04/13/2014  . DYSPNEA 07/05/2009  . VAGINITIS 07/04/2009  . OVARIAN CYST 07/04/2009  . HYPERTENSION 07/03/2009  . CARDIOMEGALY 07/03/2009  . PLEURAL EFFUSION 07/03/2009  . DIVERTICULITIS, COLON 07/03/2009  . PERSONAL HX COLONIC POLYPS 07/03/2009   Past Medical History  Diagnosis Date  . Hypertension   . Depression   . Anxiety   . Chronic abdominal pain   . IBS (irritable bowel syndrome)   . Constipation   . Learning disability   . Allergy   . Arthritis   . Neuromuscular disorder    Past Surgical History  Procedure Laterality Date  . Abdominal hysterectomy    . Knee surgery    . Gallbladder surgery     Allergies  Allergen Reactions  . Penicillins Itching   Prior to Admission medications   Medication Sig Start Date End Date Taking? Authorizing Provider  alprazolam Duanne Moron) 2 MG tablet Take 2 mg by mouth 3 (three) times daily as needed for anxiety.  01/30/15  Yes Historical Provider, MD  bisacodyl (DULCOLAX) 10 MG suppository Place 1 suppository (10 mg total) rectally as needed for moderate constipation. 03/01/15  Yes Christopher Lawyer, PA-C  olmesartan (BENICAR) 40 MG tablet Take 1 tablet (40 mg total) by mouth daily. 06/24/14  Yes Patrecia Pour, NP  zolpidem (AMBIEN CR) 12.5 MG CR tablet Take 12.5 mg by mouth at bedtime as needed for sleep.  01/30/15  Yes Historical Provider, MD   Social History   Social History  . Marital Status: Legally Separated    Spouse  Name: N/A  . Number of Children: 2  . Years of Education: N/A   Occupational History  . DISABLED    Social History Main Topics  . Smoking status: Never Smoker   . Smokeless tobacco: Never Used  . Alcohol Use: No  . Drug Use: Yes    Special: Other-see comments     Comment: opiates  . Sexual Activity: No   Other Topics Concern  . Not on file   Social History Narrative   Depression screen [facility] 2/9 05/08/2015  Decreased Interest 0  Down, Depressed, Hopeless 0  PHQ - 2 Score 0      Review of Systems  HENT: Positive for ear pain.   Gastrointestinal: Positive for abdominal pain and constipation.  Genitourinary: Negative for dysuria.  Musculoskeletal: Positive for myalgias, back pain and arthralgias.  Neurological: Positive for weakness. Negative for numbness.  Psychiatric/Behavioral: Positive for behavioral problems, sleep disturbance and dysphoric mood. Negative for suicidal ideas and self-injury. The patient is nervous/anxious.       Objective:   Physical Exam  Constitutional: She is oriented to person, place, and time. She appears well-developed and well-nourished. No distress.  HENT:  Head: Normocephalic and atraumatic.  Eyes: EOM are normal. Pupils are equal, round, and reactive to light.  Neck: Neck supple.  Cardiovascular: Regular rhythm, S1 normal and S2 normal.  Tachycardia present.   No murmur heard. Pulmonary/Chest: Effort normal.  Abdominal: Soft. She exhibits no distension and no mass. There is tenderness (generalized tenderness). There is positive Murphy's sign. There is no rebound, no guarding and no CVA tenderness.  Tympanic bowel sounds   Genitourinary:  Normal vaginal exam  Neurological: She is alert and oriented to person, place, and time. No cranial nerve deficit.  Skin: Skin is warm and dry.  Psychiatric: She has a normal mood and affect. Her behavior is normal.  Nursing note and vitals reviewed.  BP 128/76 mmHg  Pulse 93  Temp(Src) 98.6 F (37  C) (Oral)  Resp 16  Ht 5\' 6"  (1.676 m)  Wt 223 lb 12.8 oz (101.515 kg)  BMI 36.14 kg/m2  SpO2 96%     Assessment & Plan:   1. Essential hypertension   2. Sedative hypnotic or anxiolytic dependence - using ambien xr 12.5 qhs - fills each rx of #30 always a few days early and same with xanax 2mg  #90/mo rx'd by psych Dr. Rosine Door  3. Chronic abdominal pain - suspect IBS - rec elimination diet and/or low fodmaps diet.  4. Constipation, unspecified constipation type   5. Urinary body odor   6. Polypharmacy   7. Myalgia   8. Screening for STD (sexually transmitted disease)   9. Abnormal liver ultrasound   10. Exposure to sexually transmitted disease (STD)   11. Other fatigue   12. Elevated random blood glucose level - nml a1c today  13. Allergic rhinitis, unspecified allergic rhinitis type    Last narcotic  fill on 01/31/15 for hydrocodone 7.5 #30  Orders Placed This Encounter  Procedures  . GC/Chlamydia Probe Amp  . Urine culture  . Comprehensive metabolic panel  . CK  . Magnesium  . HIV antibody  . Hepatitis C antibody  . Hepatitis B surface antigen  . Hepatitis B surface antibody  . RPR  . POCT urinalysis dipstick  . POCT Wet + KOH Prep ([facility])  . POCT CBC  . Hemoccult - 1 Card (office)  . POCT glycosylated hemoglobin (Hb A1C)    Meds ordered this encounter  Medications  . DISCONTD: methocarbamol (ROBAXIN) 500 MG tablet    Sig: Take 1 tablet (500 mg total) by mouth every 6 (six) hours as needed for muscle spasms.    Dispense:  40 tablet    Refill:  0  . polyethylene glycol powder (GLYCOLAX/MIRALAX) powder    Sig: Take 17 g by mouth daily.    Dispense:  250 g    Refill:  1  . cetirizine (ZYRTEC) 10 MG tablet    Sig: Take 1 tablet (10 mg total) by mouth at bedtime.    Dispense:  30 tablet    Refill:  11  . cyclobenzaprine (FLEXERIL) 10 MG tablet    Sig: Take 1 tablet (10 mg total) by mouth at bedtime.    Dispense:  30 tablet    Refill:  0   Today I have utilized  the [place] Controlled Substance Registry's online query to confirm compliance regarding the patient's narcotic pain medications. My review reveals appropriate prescription fills and that [facility] is the sole provider of these medications. Rechecks will occur regularly and the patient is aware of our use of the system.  I personally performed the services described in this documentation, which was scribed in my presence. The recorded information has been reviewed and considered, and addended by me as needed.  Dorothy Cheadle, MD MPH     By signing my name below, I, Rawaa Al Rifaie, attest that this documentation has been prepared under the direction and in the presence of Dorothy Cheadle, MD.  Dorothy Alvarez, Medical Scribe. 05/08/2015.  2:31 PM.  Results for orders placed or performed in visit on 05/08/15  GC/Chlamydia Probe Amp  Result Value Ref Range   CT Probe RNA NEGATIVE    GC Probe RNA NEGATIVE   Urine culture  Result Value Ref Range   Colony Count 20,OOO COLONIES/ML    Organism ID, Bacteria Multiple bacterial morphotypes present, none    Organism ID, Bacteria predominant. Suggest appropriate recollection if     Organism ID, Bacteria clinically indicated.   Comprehensive metabolic panel  Result Value Ref Range   Sodium 143 135 - 146 mmol/L   Potassium 3.6 3.5 - 5.3 mmol/L   Chloride 100 98 - 110 mmol/L   CO2 30 20 - 31 mmol/L   Glucose, Bld 97 65 - 99 mg/dL   BUN 13 7 - 25 mg/dL   Creat 0.92 0.50 - 1.05 mg/dL   Total Bilirubin 0.5 0.2 - 1.2 mg/dL   Alkaline Phosphatase 79 33 - 130 U/L   AST 16 10 - 35 U/L   ALT 21 6 - 29 U/L   Total Protein 7.3 6.1 - 8.1 g/dL   Albumin 4.9 3.6 - 5.1 g/dL   Calcium 10.0 8.6 - 10.4 mg/dL  CK  Result Value Ref Range   Total CK 168 7 - 177 U/L  Magnesium  Result Value Ref Range  Magnesium 2.0 1.5 - 2.5 mg/dL  HIV antibody  Result Value Ref Range   HIV 1&2 Ab, 4th Generation NONREACTIVE NONREACTIVE  Hepatitis C antibody  Result  Value Ref Range   HCV Ab NEGATIVE NEGATIVE  Hepatitis B surface antigen  Result Value Ref Range   Hepatitis B Surface Ag NEGATIVE NEGATIVE  Hepatitis B surface antibody  Result Value Ref Range   Hepatitis B-Post 24.4 mIU/mL  RPR  Result Value Ref Range   RPR Ser Ql NON REAC NON REAC  POCT urinalysis dipstick  Result Value Ref Range   Color, UA yellow yellow   Clarity, UA clear clear   Glucose, UA negative negative   Bilirubin, UA negative negative   Ketones, POC UA trace (5) (A) negative   Spec Grav, UA 1.025    Blood, UA negative negative   pH, UA 5.5    Protein Ur, POC trace (A) negative   Urobilinogen, UA 0.2    Nitrite, UA Negative Negative   Leukocytes, UA Negative Negative  POCT Wet + KOH Prep ([facility])  Result Value Ref Range   Yeast by KOH Absent Present, Absent   Yeast by wet prep Absent Present, Absent   WBC by wet prep Few None, Few   Clue Cells Wet Prep HPF POC Few (A) None   Trich by wet prep Absent Present, Absent   Bacteria Wet Prep HPF POC Many (A) None, Few   Epithelial Cells By [employer] Pref ([facility]) Many (A) None, Few   RBC,UR,HPF,POC None None RBC/hpf  POCT CBC  Result Value Ref Range   WBC 7.7 4.6 - 10.2 K/uL   Lymph, poc 3.0 0.6 - 3.4   POC LYMPH PERCENT 38.6 10 - 50 %L   MID (cbc) 0.3 0 - 0.9   POC MID % 3.7 0 - 12 %M   POC Granulocyte 4.4 2 - 6.9   Granulocyte percent 57.7 37 - 80 %G   RBC 4.52 4.04 - 5.48 M/uL   Hemoglobin 13.6 12.2 - 16.2 g/dL   HCT, POC 42.2 37.7 - 47.9 %   MCV 93.3 80 - 97 fL   MCH, POC 30.0 27 - 31.2 pg   MCHC 32.1 31.8 - 35.4 g/dL   RDW, POC 13.5 %   Platelet Count, POC 331 142 - 424 K/uL   MPV 8.1 0 - 99.8 fL  POCT glycosylated hemoglobin (Hb A1C)  Result Value Ref Range   Hemoglobin A1C 5.5

## 2015-05-08 NOTE — Patient Instructions (Signed)
Diet and Irritable Bowel Syndrome  No cure has been found for irritable bowel syndrome (IBS). Many options are available to treat the symptoms. Your caregiver will give you the best treatments available for your symptoms. He or she will also encourage you to manage stress and to make changes to your diet. You need to work with your caregiver and Registered Dietician to find the best combination of medicine, diet, counseling, and support to control your symptoms. The following are some diet suggestions. FOODS THAT MAKE IBS WORSE  Fatty foods, such as French fries.  Milk products, such as cheese or ice cream.  Chocolate.  Alcohol.  Caffeine (found in coffee and some sodas).  Carbonated drinks, such as soda. If certain foods cause symptoms, you should eat less of them or stop eating them. FOOD JOURNAL   Keep a journal of the foods that seem to cause distress. Write down:  What you are eating during the day and when.  What problems you are having after eating.  When the symptoms occur in relation to your meals.  What foods always make you feel badly.  Take your notes with you to your caregiver to see if you should stop eating certain foods. FOODS THAT MAKE IBS BETTER Fiber reduces IBS symptoms, especially constipation, because it makes stools soft, bulky, and easier to pass. Fiber is found in bran, bread, cereal, beans, fruit, and vegetables. Examples of foods with fiber include:  Apples.  Peaches.  Pears.  Berries.  Figs.  Broccoli, raw.  Cabbage.  Carrots.  Raw peas.  Kidney beans.  Lima beans.  Whole-grain bread.  Whole-grain cereal. Add foods with fiber to your diet a little at a time. This will let your body get used to them. Too much fiber at once might cause gas and swelling of your abdomen. This can trigger symptoms in a person with IBS. Caregivers usually recommend a diet with enough fiber to produce soft, painless bowel movements. High fiber diets may  cause gas and bloating. However, these symptoms often go away within a few weeks, as your body adjusts. In many cases, dietary fiber may lessen IBS symptoms, particularly constipation. However, it may not help pain or diarrhea. High fiber diets keep the colon mildly enlarged (distended) with the added fiber. This may help prevent spasms in the colon. Some forms of fiber also keep water in the stool, thereby preventing hard stools that are difficult to pass.  Besides telling you to eat more foods with fiber, your caregiver may also tell you to get more fiber by taking a fiber pill or drinking water mixed with a special high fiber powder. An example of this is a natural fiber laxative containing psyllium seed.  TIPS  Large meals can cause cramping and diarrhea in people with IBS. If this happens to you, try eating 4 or 5 small meals a day, or try eating less at each of your usual 3 meals. It may also help if your meals are low in fat and high in carbohydrates. Examples of carbohydrates are pasta, rice, whole-grain breads and cereals, fruits, and vegetables.  If dairy products cause your symptoms to flare up, you can try eating less of those foods. You might be able to handle yogurt better than other dairy products, because it contains bacteria that helps with digestion. Dairy products are an important source of calcium and other nutrients. If you need to avoid dairy products, be sure to talk with a Registered Dietitian about getting these nutrients   through other food sources.  Drink enough water and fluids to keep your urine clear or pale yellow. This is important, especially if you have diarrhea. FOR MORE INFORMATION  International Foundation for Functional Gastrointestinal Disorders: www.iffgd.org  National Digestive Diseases Information Clearinghouse: digestive.niddk.nih.gov Document Released: 10/25/2003 Document Revised: 10/27/2011 Document Reviewed: 11/04/2013 [employer] Patient Information 2015  [employer]. This information is not intended to replace advice given to you by your health care provider. Make sure you discuss any questions you have with your health care provider.  

## 2015-05-09 LAB — HEPATITIS C ANTIBODY: HCV AB: NEGATIVE

## 2015-05-09 LAB — RPR

## 2015-05-09 LAB — GC/CHLAMYDIA PROBE AMP
CT Probe RNA: NEGATIVE
GC PROBE AMP APTIMA: NEGATIVE

## 2015-05-09 LAB — HIV ANTIBODY (ROUTINE TESTING W REFLEX): HIV 1&2 Ab, 4th Generation: NONREACTIVE

## 2015-05-09 LAB — HEPATITIS B SURFACE ANTIGEN: HEP B S AG: NEGATIVE

## 2015-05-09 LAB — HEPATITIS B SURFACE ANTIBODY, QUANTITATIVE: HEPATITIS B-POST: 24.4 m[IU]/mL

## 2015-05-10 LAB — URINE CULTURE

## 2015-05-11 ENCOUNTER — Encounter: Payer: Self-pay | Admitting: Family Medicine

## 2015-05-22 ENCOUNTER — Encounter: Payer: Self-pay | Admitting: Family Medicine

## 2015-06-25 ENCOUNTER — Ambulatory Visit (INDEPENDENT_AMBULATORY_CARE_PROVIDER_SITE_OTHER): Payer: Medicare Other

## 2015-06-25 ENCOUNTER — Ambulatory Visit (INDEPENDENT_AMBULATORY_CARE_PROVIDER_SITE_OTHER): Payer: Medicare Other | Admitting: Family Medicine

## 2015-06-25 VITALS — BP 178/136 | HR 99 | Temp 98.3°F | Resp 16 | Ht 66.0 in | Wt 225.4 lb

## 2015-06-25 DIAGNOSIS — F819 Developmental disorder of scholastic skills, unspecified: Secondary | ICD-10-CM | POA: Diagnosis not present

## 2015-06-25 DIAGNOSIS — S93401A Sprain of unspecified ligament of right ankle, initial encounter: Secondary | ICD-10-CM | POA: Diagnosis not present

## 2015-06-25 DIAGNOSIS — I10 Essential (primary) hypertension: Secondary | ICD-10-CM

## 2015-06-25 DIAGNOSIS — Z550 Illiteracy and low-level literacy: Secondary | ICD-10-CM | POA: Diagnosis not present

## 2015-06-25 MED ORDER — FUROSEMIDE 40 MG PO TABS: 40.0000 mg | ORAL_TABLET | Freq: Every day | ORAL | Status: DC

## 2015-06-25 NOTE — Progress Notes (Addendum)
[facility] [address], [place] [address] [phone]  Date:  06/25/2015   Name:  Dorothy Alvarez   DOB:  1966-01-19   MRN:  [id number]  PCP:  Bernita Raisin, MD    Chief Complaint: Pelvic Pain and Ankle Pain   History of Present Illness:  Dorothy Alvarez is a 49 y.o. very pleasant female patient who presents with the following:  Established pt here today with concern of a right ankle injury. She was getting down from an 18 wheeler 4 days ago and injured it -she is not sure of the mechanism of injury.  However it has continued to hurt and be swollen.  She is able to walk but she is limping.  She was walking around on it a good bit over the last few days and this seemed to make it worse.    She is using an ace wrap at home.  She has never sprained this ankle severly in the past, but did have a right knee scope several years ago  She had a hysterectomy in the - 2007.  This was done for fibroid tumors.   She does still have her ovaries.  She notes sx "like PMS" once a month "ever since the surgery, I never recovered."  She notes that she will have pelvic pain and cramping every day- this has been the case for years.   She also ran out of her lasix about 2 weeks ago. She is still taking her benicar when she remembers   She has been on lasix 40 daily for years- this does help with her fluid retention She had been on a potassium supplement- she is not sure if she is still taking this  She does not have a PCP. It appears that she was discharged from her last PCP.  She does have medicare, not sure about medicaid  She is not having CP or SOB She has been using a good bit of ibuprofen and aleve for her ankle currently  Asked about her having medicare at an early age; she thinks this is due to disability with "reading and writing."  Admits that she is not able to read or write   She wonders if I might give her something for ankle pain.  However I discussed with her notes  from about a year ago where it looks like she was in [facility] ER or inpatient several times due to SI having to do with withdrawal from narcotics for chronic pain as well as reliance on benzos and ambien.  Therefore I am not willing to start these medications for her at this time  BP Readings from Last 3 Encounters:  06/25/15 162/115  05/08/15 128/76  03/01/15 185/90     Patient Active Problem List   Diagnosis Date Noted  . Adjustment disorder with depressed mood 06/24/2014  . Stress reaction causing mixed disturbance of emotion and conduct   . Opiate dependence, continuous ([place]) 06/16/2014  . 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 GAD (generalized anxiety disorder) 06/16/2014  . Major depressive disorder, recurrent, severe without psychotic features ([place]) 06/16/2014  . Major depressive disorder, recurrent episode, moderate ([place]) 04/13/2014  . Insomnia 04/13/2014  . DYSPNEA 07/05/2009  . VAGINITIS 07/04/2009  . OVARIAN CYST 07/04/2009  . Essential hypertension 07/03/2009  . CARDIOMEGALY 07/03/2009  . PLEURAL EFFUSION 07/03/2009  . DIVERTICULITIS, COLON 07/03/2009  . PERSONAL HX COLONIC POLYPS 07/03/2009    Past Medical History  Diagnosis Date  .  Hypertension   . Depression   . Anxiety   . Chronic abdominal pain   . IBS (irritable bowel syndrome)   . Constipation   . Learning disability   . Allergy   . Arthritis   . Neuromuscular disorder [facility])     Past Surgical History  Procedure Laterality Date  . Abdominal hysterectomy    . Knee surgery    . Gallbladder surgery      Social History  Substance Use Topics  . Smoking status: Never Smoker   . Smokeless tobacco: Never Used  . Alcohol Use: No    Family History  Problem Relation Age of Onset  . Diabetes Mother   . Suicidality Mother   . Heart disease Father   . Depression Father     Allergies  Allergen Reactions  . Penicillins Itching    Medication list has been reviewed and  updated.  Current Outpatient Prescriptions on File Prior to Visit  Medication Sig Dispense Refill  . alprazolam (XANAX) 2 MG tablet Take 2 mg by mouth 3 (three) times daily as needed for anxiety.   1  . olmesartan (BENICAR) 40 MG tablet Take 1 tablet (40 mg total) by mouth daily.    . polyethylene glycol powder (GLYCOLAX/MIRALAX) powder Take 17 g by mouth daily. 250 g 1  . zolpidem (AMBIEN CR) 12.5 MG CR tablet Take 12.5 mg by mouth at bedtime as needed for sleep.   1  . bisacodyl (DULCOLAX) 10 MG suppository Place 1 suppository (10 mg total) rectally as needed for moderate constipation. (Patient not taking: Reported on 06/25/2015) 12 suppository 0  . cetirizine (ZYRTEC) 10 MG tablet Take 1 tablet (10 mg total) by mouth at bedtime. (Patient not taking: Reported on 06/25/2015) 30 tablet 11  . cyclobenzaprine (FLEXERIL) 10 MG tablet Take 1 tablet (10 mg total) by mouth at bedtime. (Patient not taking: Reported on 06/25/2015) 30 tablet 0   No current facility-administered medications on file prior to visit.    Review of Systems:  As per HPI- otherwise negative.   Physical Examination: Filed Vitals:   06/25/15 1308  BP: 162/115  Pulse:   Temp:   Resp:    Filed Vitals:   06/25/15 1259  Height: '5\' 6"'  (1.676 m)  Weight: 225 lb 6.4 oz (102.241 kg)   Body mass index is 36.4 kg/(m^2). Ideal Body Weight: Weight in (lb) to have BMI = 25: 154.6  GEN: WDWN, NAD, Non-toxic, A & O x 3, looks well HEENT: Atraumatic, Normocephalic. Neck supple. No masses, No LAD. Ears and Nose: No external deformity. CV: RRR, No M/G/R. No JVD. No thrill. No extra heart sounds. PULM: CTA B, no wheezes, crackles, rhonchi. No retractions. No resp. distress. No accessory muscle use. ABD: S, ND, +BS. No rebound. No HSM.  She endorses pain over her lower abdomen on exam which has been present for "years" EXTR: No c/c/e NEURO Normal gait.  PSYCH: Normally interactive. Conversant. Not depressed or anxious appearing.   Calm demeanor. Her history is somewhat disjointed and she does give the impression of a mild learning or intellectual disability  Right ankle;  Mild swelling over the lateral ankle.  This area is also tender.  No redness or heat.  Achilles intact, foot negative  [facility] reading (PRIMARY) by  Dr. Lorelei Pont. Right ankle: negative  RIGHT ANKLE - COMPLETE 3+ VIEW  COMPARISON: None.  FINDINGS: There is no evidence of fracture, dislocation, or joint effusion. There is no evidence of arthropathy or other  focal bone abnormality. Soft tissues are unremarkable.  IMPRESSION: Negative.  Placed in a sweed-o ankle brace that felt good to her  Assessment and Plan: Right ankle sprain, initial encounter - Plan: DG Ankle Complete Right  Essential hypertension - Plan: Basic metabolic panel, furosemide (LASIX) 40 MG tablet  Learning disability  Illiterate  Here today to evaluate a right ankle sprain. No fracture evident. Sprain does not seem severe.  sweed-o, ice, elevation, OTC meds as needed.  She did request a stronger pain medication but I declined to write for this; I just met her today, but looking back in her chart it appears that she has been treated with narcotics for chronic abdominal pain of unclear etiology in the past, and then developed suicidal ideation when this needed to be stopped.  Her situation is complicated by illiteracy and I suspect mental disability.  Do not think that rx of narcotics for this injury would be in her best long term interest  Her BP is too high; she is out of her lasix. Refilled this today, will check a BMP She may need to go back on potassium.  She will ask about any rx for potassium that may be at drug store.  Asked her to recheck with [place] in one week for a BP follow-up  Went over instructions verbally with her and gave in written form for her records   She has had this abd pain for about 10 years now, no acute change.  This has been evaluated in the past by CT  and specialist visits.  Do not have more to offer in this regard today.    Signed Lamar Blinks, MD  Received her labs and called on 11/10.  She states that she called her insurance company and that "they have been putting out false information about me, I don't have any record of that (narcotic use?). She states that her ankle is in so much pain that she can't remember anything. I invited her to come in for a recheck of her ankle, but after discussing again that I will not give her narcotics she declines.  I explained that I am sorry that her ankle hurts but I do not think narcotics are in her best interest. She does have an appt to see me next week for a recheck of her BP and labs per her report- this was my main concern today.    Results for orders placed or performed in visit on 27/07/86  Basic metabolic panel  Result Value Ref Range   Sodium 141 135 - 146 mmol/L   Potassium 3.7 3.5 - 5.3 mmol/L   Chloride 102 98 - 110 mmol/L   CO2 30 20 - 31 mmol/L   Glucose, Bld 85 65 - 99 mg/dL   BUN 12 7 - 25 mg/dL   Creat 0.90 0.50 - 1.05 mg/dL   Calcium 9.5 8.6 - 10.4 mg/dL

## 2015-06-25 NOTE — Patient Instructions (Signed)
Use the ankle support as needed for your ankle sprain You can also use ice and elevation as needed  I prescribed the furosemide again for you- start back on this asap See if you have any refills of your potassium at the drug store- if you do not have any refills remaining have the drug store send me a refill request   I will be in touch with there rest of your labs Your blood pressure is way too high- the furosemide will also help with this Please see [place] in one week for a blood pressure check

## 2015-06-26 LAB — BASIC METABOLIC PANEL
BUN: 12 mg/dL (ref 7–25)
CALCIUM: 9.5 mg/dL (ref 8.6–10.4)
CO2: 30 mmol/L (ref 20–31)
Chloride: 102 mmol/L (ref 98–110)
Creat: 0.9 mg/dL (ref 0.50–1.05)
Glucose, Bld: 85 mg/dL (ref 65–99)
POTASSIUM: 3.7 mmol/L (ref 3.5–5.3)
SODIUM: 141 mmol/L (ref 135–146)

## 2015-07-04 ENCOUNTER — Ambulatory Visit (INDEPENDENT_AMBULATORY_CARE_PROVIDER_SITE_OTHER): Payer: Medicare Other | Admitting: Family Medicine

## 2015-07-04 ENCOUNTER — Encounter: Payer: Self-pay | Admitting: Family Medicine

## 2015-07-04 VITALS — BP 180/120 | HR 102 | Temp 97.9°F | Resp 18 | Ht 66.0 in | Wt 219.0 lb

## 2015-07-04 DIAGNOSIS — S93401D Sprain of unspecified ligament of right ankle, subsequent encounter: Secondary | ICD-10-CM

## 2015-07-04 DIAGNOSIS — G894 Chronic pain syndrome: Secondary | ICD-10-CM

## 2015-07-04 DIAGNOSIS — I10 Essential (primary) hypertension: Secondary | ICD-10-CM

## 2015-07-04 DIAGNOSIS — F99 Mental disorder, not otherwise specified: Secondary | ICD-10-CM

## 2015-07-04 LAB — BASIC METABOLIC PANEL
BUN: 11 mg/dL (ref 7–25)
CHLORIDE: 102 mmol/L (ref 98–110)
CO2: 28 mmol/L (ref 20–31)
Calcium: 10.1 mg/dL (ref 8.6–10.4)
Creat: 1.02 mg/dL (ref 0.50–1.05)
GLUCOSE: 101 mg/dL — AB (ref 65–99)
POTASSIUM: 3.4 mmol/L — AB (ref 3.5–5.3)
SODIUM: 144 mmol/L (ref 135–146)

## 2015-07-04 NOTE — Patient Instructions (Signed)
I will be in touch with your labs I am concerned that your blood pressure is too high and recommend that we add a blood pressure medication to your regimen We will get you in to see pain management as soon as we can Continue to use the boot for your sprained ankle as needed

## 2015-07-04 NOTE — Progress Notes (Addendum)
[facility] [address], [place] [address] [phone]  Date:  07/04/2015   Name:  Dorothy Alvarez   DOB:  Mar 19, 1966   MRN:  [id number]  PCP:  Bernita Raisin, MD    Chief Complaint: Follow-up and Depression   History of Present Illness:  Dorothy Alvarez is a 49 y.o. very pleasant female patient who presents with the following:  She was seen about 10 days ago for a right ankle sprain.  Negative x-rays.   We also restarted her lasix as her BP was too high She is taking her benicar and her lasix for her BP She does not check her BP at home  Her right ankle is "worse, pain is up. It's just unbearable.  I wear the boot cause I need it all the time." However she is not wearing it today.  She wants to have a referral to pain management.  This is also for her lower abdominal pain that she has had for years. And "also my ear is hurting, I don't know it its due to sinus."    She is a pt at behavorial health for her mental health issues.  They are managing her psychotropic drugs per her report    She is taking benadryl OTC for her ear.   She follows a thought pattern that while not psychotic does not make complete sense.  She tells me that "if I want to get drugs I know where the drug man is. I just want you to treat this (ankle/ stomach/ chronic) pain."  States that she has never had any issues with dependence on any drug (her chart says differently).    BP Readings from Last 3 Encounters:  07/04/15 180/120  06/25/15 178/136  05/08/15 128/76     Patient Active Problem List   Diagnosis Date Noted  . Learning disability 06/25/2015  . Illiterate 06/25/2015  . Adjustment disorder with depressed mood 06/24/2014  . Stress reaction causing mixed disturbance of emotion and conduct   . Opiate dependence, continuous ([place]) 06/16/2014  . 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 GAD (generalized anxiety disorder) 06/16/2014  . Major depressive disorder,  recurrent, severe without psychotic features ([place]) 06/16/2014  . Major depressive disorder, recurrent episode, moderate ([place]) 04/13/2014  . Insomnia 04/13/2014  . DYSPNEA 07/05/2009  . VAGINITIS 07/04/2009  . OVARIAN CYST 07/04/2009  . Essential hypertension 07/03/2009  . CARDIOMEGALY 07/03/2009  . PLEURAL EFFUSION 07/03/2009  . DIVERTICULITIS, COLON 07/03/2009  . PERSONAL HX COLONIC POLYPS 07/03/2009    Past Medical History  Diagnosis Date  . Hypertension   . Depression   . Anxiety   . Chronic abdominal pain   . IBS (irritable bowel syndrome)   . Constipation   . Learning disability   . Allergy   . Arthritis   . Neuromuscular disorder [facility])     Past Surgical History  Procedure Laterality Date  . Abdominal hysterectomy    . Knee surgery    . Gallbladder surgery      Social History  Substance Use Topics  . Smoking status: Never Smoker   . Smokeless tobacco: Never Used  . Alcohol Use: No    Family History  Problem Relation Age of Onset  . Diabetes Mother   . Suicidality Mother   . Heart disease Father   . Depression Father     Allergies  Allergen Reactions  . Penicillins Itching    Medication list has been reviewed and updated.  Current Outpatient Prescriptions on File Prior to Visit  Medication Sig Dispense Refill  . alprazolam (XANAX) 2 MG tablet Take 2 mg by mouth 3 (three) times daily as needed for anxiety.   1  . furosemide (LASIX) 40 MG tablet Take 1 tablet (40 mg total) by mouth daily. 30 tablet 3  . olmesartan (BENICAR) 40 MG tablet Take 1 tablet (40 mg total) by mouth daily.    . polyethylene glycol powder (GLYCOLAX/MIRALAX) powder Take 17 g by mouth daily. 250 g 1  . zolpidem (AMBIEN CR) 12.5 MG CR tablet Take 12.5 mg by mouth at bedtime as needed for sleep.   1   No current facility-administered medications on file prior to visit.    Review of Systems:  As per HPI- otherwise negative.   Physical Examination: Filed Vitals:   07/04/15  1521  BP: 180/120  Pulse:   Temp:   Resp:    Filed Vitals:   07/04/15 1506  Height: 5\' 6"  (1.676 m)  Weight: 219 lb (99.338 kg)   Body mass index is 35.36 kg/(m^2). Ideal Body Weight: Weight in (lb) to have BMI = 25: 154.6  GEN: WDWN, NAD, Non-toxic, A & O x 3, obese, looks well HEENT: Atraumatic, Normocephalic. Neck supple. No masses, No LAD. Ears and Nose: No external deformity. CV: RRR, No M/G/R. No JVD. No thrill. No extra heart sounds. PULM: CTA B, no wheezes, crackles, rhonchi. No retractions. No resp. distress. No accessory muscle use. EXTR: No c/c/e NEURO Normal gait.  PSYCH: Normally interactive. Conversant. Not depressed or anxious appearing.  Calm demeanor.  She is not wearing her CAM boot today.  She has minimal to no swelling in her right lateral ankle.  Full ROM of the ankle.  She endorses mild tenderness over the lateral ligaments of the ankle.    Assessment and Plan: Essential hypertension - Plan: Basic metabolic panel  Chronic pain syndrome - Plan: Pain Management Screening Profile (10S)  Chronic mental illness  Right ankle sprain, subsequent encounter   Advised pt that I think we need to change her BP medication as her pressure is too high, but she declines to do this. She feels like her medications are "making me sick on my stomach" and she does not want to add any other medication I will check her BMP today to see how her K is looking with restarting lasix   She tells me that her ankle pain is severe enough that she needs to see pain management.  She appears to have a mild sprain with minimal to no swelling and no bruise.  I did refer to pain management per her request- this is also for her chronic abd pain  Unfortunately Benina suffers from a chronic mental illness.  I am not sure where she is getting her mental health care at this time- it appears that she was dismissed from her last psychiatry office due to making threats against her provider. This is  likely a big contribute in her chronic pain and fixation on what seems to be a mild ankle sprain.    Signed Lamar Blinks, MD  Called and [facility] 11/16; her K is a bit low.  I ordered K dur for her to take once a day while she is on lasix. Please come by or a repeat BMP in 1-2 weeks to make sure her K has stabilized

## 2015-07-05 ENCOUNTER — Encounter: Payer: Self-pay | Admitting: Family Medicine

## 2015-07-05 MED ORDER — POTASSIUM CHLORIDE CRYS ER 20 MEQ PO TBCR: 20.0000 meq | EXTENDED_RELEASE_TABLET | Freq: Every day | ORAL | Status: DC

## 2015-07-05 NOTE — Addendum Note (Signed)
Addended by: Lamar Blinks C on: 07/05/2015 05:15 PM   Modules accepted: Orders

## 2015-07-06 LAB — PMP SCREEN PROFILE (10S), URINE
Amphetamine Screen, Ur: NEGATIVE ng/mL
BENZODIAZEPINE SCREEN, URINE: NEGATIVE ng/mL
Barbiturate Screen, Ur: NEGATIVE ng/mL
CANNABINOIDS UR QL SCN: NEGATIVE ng/mL
CREATININE(CRT), U: 7.1 mg/dL — AB (ref 20.0–300.0)
Cocaine(Metab.)Screen, Urine: NEGATIVE ng/mL
Methadone Scn, Ur: NEGATIVE ng/mL
Opiate Scrn, Ur: NEGATIVE ng/mL
Oxycodone+Oxymorphone Ur Ql Scn: NEGATIVE ng/mL
PCP Scrn, Ur: NEGATIVE ng/mL
PH UR, DRUG SCRN: 5.9 (ref 4.5–8.9)
Propoxyphene, Screen: NEGATIVE ng/mL

## 2015-07-06 LAB — SPECIFIC GRAVITY: SPECIFIC GRAVITY: 1.0016

## 2015-07-14 ENCOUNTER — Other Ambulatory Visit: Payer: Self-pay | Admitting: Family Medicine

## 2015-08-15 ENCOUNTER — Other Ambulatory Visit: Payer: Self-pay | Admitting: Family Medicine

## 2015-10-03 ENCOUNTER — Other Ambulatory Visit: Payer: Self-pay | Admitting: Family Medicine

## 2015-10-23 ENCOUNTER — Telehealth: Payer: Self-pay

## 2015-10-23 NOTE — Telephone Encounter (Signed)
Patient is requesting a referral for pain management.  States Dr. Lorelei Pont would not do a referral for her pain.  Wants to tell the insurance company.    Chronic pain all over, recently went to Dr. Ace Gins - the insurance company has that information.    Lower back, stomach, treatment made it worse.   [phone]

## 2015-10-26 ENCOUNTER — Telehealth: Payer: Self-pay

## 2015-10-26 ENCOUNTER — Ambulatory Visit (INDEPENDENT_AMBULATORY_CARE_PROVIDER_SITE_OTHER): Payer: Medicare HMO | Admitting: Family Medicine

## 2015-10-26 ENCOUNTER — Other Ambulatory Visit: Payer: Self-pay | Admitting: Physician Assistant

## 2015-10-26 VITALS — BP 132/84 | HR 103 | Temp 97.7°F | Resp 18 | Wt 233.6 lb

## 2015-10-26 DIAGNOSIS — G894 Chronic pain syndrome: Secondary | ICD-10-CM

## 2015-10-26 DIAGNOSIS — G8929 Other chronic pain: Secondary | ICD-10-CM

## 2015-10-26 DIAGNOSIS — M545 Low back pain: Secondary | ICD-10-CM | POA: Diagnosis not present

## 2015-10-26 DIAGNOSIS — M797 Fibromyalgia: Secondary | ICD-10-CM

## 2015-10-26 NOTE — Patient Instructions (Signed)
Chronic Pain Chronic pain can be defined as pain that is off and on and lasts for 3-6 months or longer. Many things cause chronic pain, which can make it difficult to make a diagnosis. There are many treatment options available for chronic pain. However, finding a treatment that works well for you may require trying various approaches until the right one is found. Many people benefit from a combination of two or more types of treatment to control their pain. SYMPTOMS  Chronic pain can occur anywhere in the body and can range from mild to very severe. Some types of chronic pain include:  Headache.  Low back pain.  Cancer pain.  Arthritis pain.  Neurogenic pain. This is pain resulting from damage to nerves. People with chronic pain may also have other symptoms such as:  Depression.  Anger.  Insomnia.  Anxiety. DIAGNOSIS  Your health care provider will help diagnose your condition over time. In many cases, the initial focus will be on excluding possible conditions that could be causing the pain. Depending on your symptoms, your health care provider may order tests to diagnose your condition. Some of these tests may include:   Blood tests.   CT scan.   MRI.   X-rays.   Ultrasounds.   Nerve conduction studies.  You may need to see a specialist.  TREATMENT  Finding treatment that works well may take time. You may be referred to a pain specialist. He or she may prescribe medicine or therapies, such as:   Mindful meditation or yoga.  Shots (injections) of numbing or pain-relieving medicines into the spine or area of pain.  Local electrical stimulation.  Acupuncture.   Massage therapy.   Aroma, color, light, or sound therapy.   Biofeedback.   Working with a physical therapist to keep from getting stiff.   Regular, gentle exercise.   Cognitive or behavioral therapy.   Group support.  Sometimes, surgery may be recommended.  HOME CARE INSTRUCTIONS    Take all medicines as directed by your health care provider.   Lessen stress in your life by relaxing and doing things such as listening to calming music.   Exercise or be active as directed by your health care provider.   Eat a healthy diet and include things such as vegetables, fruits, fish, and lean meats in your diet.   Keep all follow-up appointments with your health care provider.   Attend a support group with others suffering from chronic pain. SEEK MEDICAL CARE IF:   Your pain gets worse.   You develop a new pain that was not there before.   You cannot tolerate medicines given to you by your health care provider.   You have new symptoms since your last visit with your health care provider.  SEEK IMMEDIATE MEDICAL CARE IF:   You feel weak.   You have decreased sensation or numbness.   You lose control of bowel or bladder function.   Your pain suddenly gets much worse.   You develop shaking.  You develop chills.  You develop confusion.  You develop chest pain.  You develop shortness of breath.  MAKE SURE YOU:  Understand these instructions.  Will watch your condition.  Will get help right away if you are not doing well or get worse.   This information is not intended to replace advice given to you by your health care provider. Make sure you discuss any questions you have with your health care provider.   Document Released: 04/26/2002  Document Revised: 04/06/2013 Document Reviewed: 01/28/2013 Elsevier Interactive Patient Education 2016 Elsevier Inc.  Myofascial Pain Syndrome and Fibromyalgia Myofascial pain syndrome and fibromyalgia are both pain disorders. This pain may be felt mainly in your muscles.   Myofascial pain syndrome:  Always has trigger points or tender points in the muscle that will cause pain when pressed. The pain may come and go.  Usually affects your neck, upper back, and shoulder areas. The pain often radiates into  your arms and hands.  Fibromyalgia:  Has muscle pains and tenderness that come and go.  Is often associated with fatigue and sleep disturbances.  Has trigger points.  Tends to be long-lasting (chronic), but is not life-threatening. Fibromyalgia and myofascial pain are not the same. However, they often occur together. If you have both conditions, each can make the other worse. Both are common and can cause enough pain and fatigue to make day-to-day activities difficult.  CAUSES  The exact causes of fibromyalgia and myofascial pain are not known. People with certain gene types may be more likely to develop fibromyalgia. Some factors can be triggers for both conditions, such as:   Spine disorders.  Arthritis.  Severe injury (trauma) and other physical stressors.  Being under a lot of stress.  A medical illness. SIGNS AND SYMPTOMS  Fibromyalgia The main symptom of fibromyalgia is widespread pain and tenderness in your muscles. This can vary over time. Pain is sometimes described as stabbing, shooting, or burning. You may have tingling or numbness, too. You may also have sleep problems and fatigue. You may wake up feeling tired and groggy (fibro fog). Other symptoms may include:   Bowel and bladder problems.  Headaches.  Visual problems.  Problems with odors and noises.  Depression or mood changes.  Painful menstrual periods (dysmenorrhea).  Dry skin or eyes. Myofascial pain syndrome Symptoms of myofascial pain syndrome include:   Tight, ropy bands of muscle.   Uncomfortable sensations in muscular areas, such as:  Aching.  Cramping.  Burning.  Numbness.  Tingling.   Muscle weakness.  Trouble moving certain muscles freely (range of motion). DIAGNOSIS  There are no specific tests to diagnose fibromyalgia or myofascial pain syndrome. Both can be hard to diagnose because their symptoms are common in many other conditions. Your health care provider may suspect  one or both of these conditions based on your symptoms and medical history. Your health care provider will also do a physical exam.  The key to diagnosing fibromyalgia is having pain, fatigue, and other symptoms for more than three months that cannot be explained by another condition.  The key to diagnosing myofascial pain syndrome is finding trigger points in muscles that are tender and cause pain elsewhere in your body (referred pain). TREATMENT  Treating fibromyalgia and myofascial pain often requires a team of health care providers. This usually starts with your primary provider and a physical therapist. You may also find it helpful to work with alternative health care providers, such as massage therapists or acupuncturists. Treatment for fibromyalgia may include medicines. This may include nonsteroidal anti-inflammatory drugs (NSAIDs), along with other medicines.  Treatment for myofascial pain may also include:  NSAIDs.  Cooling and stretching of muscles.  Trigger point injections.  Sound wave (ultrasound) treatments to stimulate muscles. HOME CARE INSTRUCTIONS   Take medicines only as directed by your health care provider.  Exercise as directed by your health care provider or physical therapist.  Try to avoid stressful situations.  Practice relaxation techniques to control  your stress. You may want to try:  Biofeedback.  Visual imagery.  Hypnosis.  Muscle relaxation.  Yoga.  Meditation.  Talk to your health care provider about alternative treatments, such as acupuncture or massage treatment.  Maintain a healthy lifestyle. This includes eating a healthy diet and getting enough sleep.  Consider joining a support group.  Do not do activities that stress or strain your muscles. That includes repetitive motions and heavy lifting. SEEK MEDICAL CARE IF:   You have new symptoms.  Your symptoms get worse.  You have side effects from your medicines.  You have trouble  sleeping.  Your condition is causing depression or anxiety. FOR MORE INFORMATION   National Fibromyalgia Association: http://www.fmaware.orgwww.fmaware.[place]: http://www.arthritis.orgwww.arthritis.org  American Chronic Pain Association: StreetWrestling.at.https://stevens.biz/   This information is not intended to replace advice given to you by your health care provider. Make sure you discuss any questions you have with your health care provider.   Document Released: 08/04/2005 Document Revised: 08/25/2014 Document Reviewed: 05/10/2014 Elsevier Interactive Patient Education [employer].

## 2015-10-26 NOTE — Telephone Encounter (Signed)
Per Copland's note she placed referral but this does not show up in future appointments.  I have placed.  Philis Fendt, MS, PA-C 8:52 AM, 10/26/2015

## 2015-10-26 NOTE — Progress Notes (Signed)
Subjective:    Patient ID: Dorothy Alvarez, female    DOB: 1966-06-26, 50 y.o.   MRN: [id number] By signing my name below, I, Judithe Modest, attest that this documentation has been prepared under the direction and in the presence of [name], MD. Electronically Signed: Judithe Modest, ER Scribe. 10/26/2015. 8:05 PM.  Chief Complaint  Patient presents with  . Shortness of Breath    x2-3 days  . Anxiety    last few months   HPI  PT is illiterate.  HPI Comments: Dorothy Alvarez is a 51 y.o. female who presents to [facility] complaining of back pain that radiates to her bilateral arms and neck as well as anxiety. Pt reports severe ongoing pain that becomes worse with palpation or exercise. She reports she was treated with soma and that improved her sx while she was on it. She was seen by Dr. Niel Hummer and received pain injections which seemed to make her sx worse. Before the injections her pain did not radiate to her arms or neck. She states to this day she still has the pain of the injections at the site where medication was injected into her back. She states when she gets anxious she sometimes lashes out, and she gets anxious when she is in pain. She states she is just trying to reach out for help. She was also seen at [facility] pain management and she feels her treatment there did not improve her sx.  Last seen by Dr. Edilia Bo four months ago. Pt was a previous pt at behavioral health and they were managing her psychotropic medications. She has not been seen at behavioral health since 06/06/2014. She is illiterate, hx of adjustment disorder, stress reaction, major recurrent depression, anxiety, opioate dependence, and benzodiazepine dependence. She also has a hx of cardiomegaly and plural effusions. Dr. Edilia Bo notes that she was dismissed from her past psychiatry practice after making threats to the provider. When she    Past Medical History  Diagnosis Date  . Hypertension   . Depression   .  Anxiety   . Chronic abdominal pain   . IBS (irritable bowel syndrome)   . Constipation   . Learning disability   . Allergy   . Arthritis   . Neuromuscular disorder (HCC)    Allergies  Allergen Reactions  . Penicillins Itching   Current Outpatient Prescriptions on File Prior to Visit  Medication Sig Dispense Refill  . alprazolam (XANAX) 2 MG tablet Take 2 mg by mouth 3 (three) times daily as needed for anxiety.   1  . furosemide (LASIX) 40 MG tablet TAKE 1 TABLET BY MOUTH EVERY DAY 90 tablet 0  . olmesartan (BENICAR) 40 MG tablet Take 1 tablet (40 mg total) by mouth daily.    Marland Kitchen oxcarbazepine (TRILEPTAL) 600 MG tablet Take 1,200 mg by mouth 2 (two) times daily.  1  . potassium chloride SA (K-DUR,KLOR-CON) 20 MEQ tablet Take 1 tablet (20 mEq total) by mouth daily. 30 tablet 5  . zolpidem (AMBIEN CR) 12.5 MG CR tablet Take 12.5 mg by mouth at bedtime as needed for sleep.   1   No current facility-administered medications on file prior to visit.   Depression screen [facility] 2/9 10/26/2015 07/04/2015 05/08/2015  Decreased Interest 0 3 0  Down, Depressed, Hopeless 0 3 0  PHQ - 2 Score 0 6 0  Altered sleeping - 3 -  Tired, decreased energy - 3 -  Change in appetite - 3 -  Feeling  bad or failure about yourself  - 3 -  Trouble concentrating - 3 -  Moving slowly or fidgety/restless - 3 -  Suicidal thoughts - 0 -  PHQ-9 Score - 24 -  Difficult doing work/chores - Extremely dIfficult -      Review of Systems  Constitutional: Positive for diaphoresis, activity change and appetite change. Negative for fever, chills and fatigue.  Cardiovascular: Negative for leg swelling.  Gastrointestinal: Negative for vomiting, abdominal pain and diarrhea.  Musculoskeletal: Positive for myalgias, back pain, arthralgias, neck pain and neck stiffness. Negative for gait problem.  Skin: Negative for rash.  Neurological: Negative for weakness and numbness.  Psychiatric/Behavioral: Positive for behavioral  problems and agitation. Negative for sleep disturbance and dysphoric mood. The patient is nervous/anxious.       Objective:  BP 132/84 mmHg  Pulse 103  Temp(Src) 97.7 F (36.5 C) (Oral)  Resp 18  Wt 233 lb 9.6 oz (105.96 kg)  SpO2 95%  Physical Exam  Constitutional: She is oriented to person, place, and time. She appears well-developed and well-nourished. No distress.  HENT:  Head: Normocephalic and atraumatic.  Eyes: Pupils are equal, round, and reactive to light.  Neck: Neck supple.  Cardiovascular: Normal rate.   Pulmonary/Chest: Effort normal. No respiratory distress.  Musculoskeletal: Normal range of motion.  TTP over L5-S1 spinous process. Lower lumbar upper sacral paraspinal muscles are TTP. Pain over SI joints. Bilateral trochanteric bursa tenderness.   After palpation of muscles and joints pt reported pain continued for several minutes, hyperaesthetic.   Neurological: She is alert and oriented to person, place, and time. Coordination normal.  2+ patellar and achilles reflexes.   Skin: Skin is warm and dry. She is not diaphoretic.  Psychiatric: Her affect is angry and labile. Her speech is rapid and/or pressured. She is agitated. Thought content is paranoid. She expresses impulsivity and inappropriate judgment. She exhibits a depressed mood. She exhibits normal remote memory.  Nursing note and vitals reviewed.     Assessment & Plan:   1. Fibromyalgia - suspect this as cause of pt's pain - never diagnosed as such pressure but her muscles due seem to be hyperesthetic in all 4 ext and trunk, + trigger/tendon points - advised pt that th eoly way she is likely to get any long-term improvement is to start exercise. Pt would be willing to try some water therapy with PT  2. Chronic pain syndrome   3. Chronic lumbar pain   Pt has failed cyclobenzaprine prior so encouraged to try amrix - the long acting ER version so pt may tolerate an increased dose.  Unfortunately, I doubt there is  any acceptable and safe medication regimen that is going to alleviate pt's pain so will try for pain management referral. Pt very adamant that she will never take ANY type of psychiatric medication - even an SNRI or TCA which would help pain.  Pt requests soma - only muscle relaxant that has worked for her but as her pain is chronic I am not willing to rx this med. Pt is very upset - feels like no provider is listening to her nor wants to help her.  She is frustrated that her past hx is now limiting her treatment - that she is being punished for past mistakes   Orders Placed This Encounter  Procedures  . Ambulatory referral to Physical Therapy    Referral Priority:  Routine    Referral Type:  Physical Medicine    Referral Reason:  Specialty Services Required    Requested Specialty:  Physical Therapy    Number of Visits Requested:  1  . Ambulatory referral to Pain Clinic    Referral Priority:  Routine    Referral Type:  Consultation    Referral Reason:  Specialty Services Required    Requested Specialty:  Pain Medicine    Number of Visits Requested:  1  . Ambulatory referral to Orthopedic Surgery    Referral Priority:  Routine    Referral Type:  Surgical    Referral Reason:  Specialty Services Required    Requested Specialty:  Orthopedic Surgery    Number of Visits Requested:  1    Over 40 min spent in face-to-face evaluation of and consultation with patient and coordination of care.  Over 50% of this time was spent counseling this patient.  I personally performed the services described in this documentation, which was scribed in my presence. The recorded information has been reviewed and considered, and addended by me as needed.  [name], MD MPH

## 2015-10-26 NOTE — Telephone Encounter (Signed)
Spoke with patient for over 45 minutes. She states she feels that the [place] is rejecting her and not caring about her medical treatment. I advised her that we care about her medical care. She thinks a doctor put false allegations in her chart for everyone to think she misuses her medications. She did admit to threatening her doctor that she was seeing for 13 years. She states she is trying to be clear from this and get her pain under control. She is asking what can we do until she gets into the pain management? I advised her she has to come in to be seen or re-establish care with someone. She feels like Dr. Lorelei Pont did not care about her health and she does not want to go back to her. She states she is still in pain and she is seeking help from [place]. She wants someone to tell her what to do and I keep advising her to come in. What should I tell her? I advised her that I would try to talk to somebody and get back with her. I called Legrand Como downstairs and he is going to pull a DEA report and review her chart so I can advise her the next steps. Legrand Como advised she come in to be seen and we can sort this out. FYI Dr. Tamala Julian

## 2015-10-26 NOTE — Telephone Encounter (Signed)
Left message- referral placed.  

## 2015-11-20 ENCOUNTER — Ambulatory Visit: Payer: Medicare Other | Admitting: Physical Therapy

## 2016-02-13 ENCOUNTER — Encounter: Payer: Self-pay | Admitting: Gastroenterology

## 2016-04-04 ENCOUNTER — Emergency Department (HOSPITAL_COMMUNITY)
Admission: EM | Admit: 2016-04-04 | Discharge: 2016-04-04 | Disposition: A | Discharge status: Home / Self Care | Payer: Medicare HMO | Attending: Emergency Medicine | Admitting: Emergency Medicine

## 2016-04-04 ENCOUNTER — Encounter (HOSPITAL_COMMUNITY): Payer: Self-pay | Admitting: *Deleted

## 2016-04-04 DIAGNOSIS — I10 Essential (primary) hypertension: Secondary | ICD-10-CM | POA: Diagnosis not present

## 2016-04-04 DIAGNOSIS — R109 Unspecified abdominal pain: Secondary | ICD-10-CM | POA: Diagnosis present

## 2016-04-04 DIAGNOSIS — M545 Low back pain, unspecified: Secondary | ICD-10-CM

## 2016-04-04 LAB — URINALYSIS, ROUTINE W REFLEX MICROSCOPIC
BILIRUBIN URINE: NEGATIVE
Glucose, UA: NEGATIVE mg/dL
HGB URINE DIPSTICK: NEGATIVE
KETONES UR: NEGATIVE mg/dL
Leukocytes, UA: NEGATIVE
Nitrite: NEGATIVE
PH: 5.5 (ref 5.0–8.0)
Protein, ur: NEGATIVE mg/dL
SPECIFIC GRAVITY, URINE: 1.011 (ref 1.005–1.030)

## 2016-04-04 LAB — CBC
HEMATOCRIT: 42 % (ref 36.0–46.0)
HEMOGLOBIN: 14.2 g/dL (ref 12.0–15.0)
MCH: 31.6 pg (ref 26.0–34.0)
MCHC: 33.8 g/dL (ref 30.0–36.0)
MCV: 93.5 fL (ref 78.0–100.0)
Platelets: 346 10*3/uL (ref 150–400)
RBC: 4.49 MIL/uL (ref 3.87–5.11)
RDW: 13.1 % (ref 11.5–15.5)
WBC: 5.7 10*3/uL (ref 4.0–10.5)

## 2016-04-04 LAB — COMPREHENSIVE METABOLIC PANEL
ALBUMIN: 4.7 g/dL (ref 3.5–5.0)
ALK PHOS: 80 U/L (ref 38–126)
ALT: 23 U/L (ref 14–54)
ANION GAP: 9 (ref 5–15)
AST: 22 U/L (ref 15–41)
BILIRUBIN TOTAL: 0.7 mg/dL (ref 0.3–1.2)
BUN: 8 mg/dL (ref 6–20)
CALCIUM: 9.4 mg/dL (ref 8.9–10.3)
CO2: 29 mmol/L (ref 22–32)
Chloride: 100 mmol/L — ABNORMAL LOW (ref 101–111)
Creatinine, Ser: 1.07 mg/dL — ABNORMAL HIGH (ref 0.44–1.00)
GFR calc Af Amer: >60 mL/min (ref >60)
GFR calc non Af Amer: 59 mL/min — ABNORMAL LOW (ref >60)
GLUCOSE: 97 mg/dL (ref 65–99)
Potassium: 3 mmol/L — ABNORMAL LOW (ref 3.5–5.1)
SODIUM: 138 mmol/L (ref 135–145)
Total Protein: 7.3 g/dL (ref 6.5–8.1)

## 2016-04-04 LAB — LIPASE, BLOOD: Lipase: 22 U/L (ref 11–51)

## 2016-04-04 MED ORDER — METHOCARBAMOL 500 MG PO TABS
500.0000 mg | ORAL_TABLET | Freq: Once | ORAL | Status: AC
  Administered 2016-04-04: 500 mg via ORAL
  Filled 2016-04-04: qty 1

## 2016-04-04 MED ORDER — KETOROLAC TROMETHAMINE 60 MG/2ML IM SOLN
30.0000 mg | Freq: Once | INTRAMUSCULAR | Status: AC
  Administered 2016-04-04: 30 mg via INTRAMUSCULAR
  Filled 2016-04-04: qty 2

## 2016-04-04 MED ORDER — METHOCARBAMOL 500 MG PO TABS: 500.0000 mg | ORAL_TABLET | Freq: Three times a day (TID) | ORAL | 0 refills | Status: AC

## 2016-04-04 NOTE — ED Notes (Signed)
Pt at discharge verbalizes understanding of instructions and prescriptions. Reports she is "still suffering and in pain and I'm frustrated that I'm still hurting." pt has discussed these feelings with MD. Liana Gerold patient a wheelchair at departure, pt reports "No I do not want a wheelchair, I'm going to suffer my way out of here and I'm ready to just go." pt is in NAD at departure. Leaving department with son.

## 2016-04-04 NOTE — ED Provider Notes (Signed)
I saw and evaluated the patient, reviewed the resident's note and I agree with the findings and plan.  50 year old female with chronic abdominal and back pain that is being worked on by her primary doctor and is waiting pain clinic evaluation after a gastroenterology appointment in a few months. No worsening today. No neurologic or GI changes. Exam with a normal abdomen without tenderness. No neurologic abnormalities suggest spinal pathology. Offered Bentyl to try to help with her discomfort however patient refused. Tried Toradol and Robaxin in the emergency Department without relief. Will continue outpatient follow-up.   Merrily Pew, MD 04/04/16 2352

## 2016-04-04 NOTE — ED Notes (Signed)
Pt went to medical records, still not back.

## 2016-04-04 NOTE — ED Provider Notes (Signed)
Pt did not return to room for evaluation.  I have not evaluated this patient.     Domenic Moras, PA-C 04/04/16 1547

## 2016-04-04 NOTE — Progress Notes (Addendum)
CSW was called by Triage stating that Patient was asking to speak to CSW. CSW engaged with Patient in the lobby. Patient began to discuss her complaints from a July 2016 ED visit she had at [facility]. She reports needing counseling as she is still traumatized from this experience. CSW offered to provide her with counseling resources, however, Patient reports that she has counselors. Patient did not want to disclose who these counselors are. CSW directed Patient to Patient Experience to further discuss her concerns. CSW assisted Patient in completing release of medical records application. CSW made three copies at Patient's request. Patient was escorted to medical records by nurse secretary, Cassie.          Emiliano Dyer, LCSW [facility] ED/91M Clinical Social Worker [phone]

## 2016-04-04 NOTE — ED Triage Notes (Signed)
Pt here for tx of chronic lower abdominal pain (post hysterectomy) and back pain (where she received shot in spine for pain).

## 2016-04-04 NOTE — ED Notes (Signed)
Pt ambulatory at discharge with steady gait.

## 2016-04-04 NOTE — ED Provider Notes (Signed)
[facility] DEPT Provider Note   CSN: [id number] Arrival date & time: 04/04/16  1231     History   Chief Complaint Chief Complaint  Patient presents with  . Abdominal Pain    HPI Dorothy Alvarez is a 49 y.o. female.  HPI  Patient with history of chronic abdominal pain after hysterectomy, and chronic back pain presents complaining of "chronic back pain", and "chronic abdominal pain."  Her back pain has been going on for years, however she reports having a steroid injection 8 months ago and the pain has been worse since.  No new falls, other trauma, or new pain.  Regarding abdominal pain, it is lower abdomen, unchanged from the pain she has had for years.  She denies new symptoms or changing quality of the pain.  No fevers, chills, recent illness.  She mentions difficulty getting in to a chronic pain clinic, and her reason for coming to the ED was "I need something for the pain."  Past Medical History:  Diagnosis Date  . Allergy   . Anxiety   . Arthritis   . Chronic abdominal pain   . Constipation   . Depression   . Hypertension   . IBS (irritable bowel syndrome)   . Learning disability   . Neuromuscular disorder [facility])     Patient Active Problem List   Diagnosis Date Noted  . Learning disability 06/25/2015  . Illiterate 06/25/2015  . Adjustment disorder with depressed mood 06/24/2014  . Stress reaction causing mixed disturbance of emotion and conduct   . Opiate dependence, continuous ([place]) 06/16/2014  . 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 GAD (generalized anxiety disorder) 06/16/2014  . Major depressive disorder, recurrent, severe without psychotic features ([place]) 06/16/2014  . Major depressive disorder, recurrent episode, moderate ([place]) 04/13/2014  . Insomnia 04/13/2014  . DYSPNEA 07/05/2009  . VAGINITIS 07/04/2009  . OVARIAN CYST 07/04/2009  . Essential hypertension 07/03/2009  . CARDIOMEGALY 07/03/2009  . PLEURAL EFFUSION 07/03/2009  .  DIVERTICULITIS, COLON 07/03/2009  . PERSONAL HX COLONIC POLYPS 07/03/2009    Past Surgical History:  Procedure Laterality Date  . ABDOMINAL HYSTERECTOMY    . GALLBLADDER SURGERY    . KNEE SURGERY      OB History    No data available       Home Medications    Prior to Admission medications   Medication Sig Start Date End Date Taking? Authorizing Provider  alprazolam Duanne Moron) 2 MG tablet Take 2 mg by mouth 3 (three) times daily as needed for anxiety.  01/30/15   Historical Provider, MD  furosemide (LASIX) 40 MG tablet TAKE 1 TABLET BY MOUTH EVERY DAY 10/05/15   Dorian Heckle English, PA  olmesartan (BENICAR) 40 MG tablet Take 1 tablet (40 mg total) by mouth daily. 06/24/14   Patrecia Pour, NP  oxcarbazepine (TRILEPTAL) 600 MG tablet Take 1,200 mg by mouth 2 (two) times daily. 05/15/15   Historical Provider, MD  potassium chloride SA (K-DUR,KLOR-CON) 20 MEQ tablet Take 1 tablet (20 mEq total) by mouth daily. 07/05/15   Gay Filler Copland, MD  zolpidem (AMBIEN CR) 12.5 MG CR tablet Take 12.5 mg by mouth at bedtime as needed for sleep.  01/30/15   Historical Provider, MD    Family History Family History  Problem Relation Age of Onset  . Diabetes Mother   . Suicidality Mother   . Heart disease Father   . Depression Father     Social History Social History  Substance Use Topics  .  Smoking status: Never Smoker  . Smokeless tobacco: Never Used  . Alcohol use No     Allergies   Penicillins   Review of Systems Review of Systems  Constitutional: Negative for chills and fever.  HENT: Negative for ear pain and sore throat.   Eyes: Negative for pain and visual disturbance.  Respiratory: Negative for cough and shortness of breath.   Cardiovascular: Negative for chest pain and palpitations.  Gastrointestinal: Positive for abdominal pain. Negative for vomiting.  Genitourinary: Negative for dysuria and hematuria.  Musculoskeletal: Positive for back pain. Negative for arthralgias.    Skin: Negative for color change and rash.  Neurological: Negative for seizures and syncope.  All other systems reviewed and are negative.    Physical Exam Updated Vital Signs BP 158/100 (BP Location: Right Arm)   Pulse 88   Temp 98.4 F (36.9 C) (Oral)   Resp 17   Wt 102.1 kg   SpO2 100%   BMI 36.32 kg/m   Physical Exam  Constitutional: She appears well-developed and well-nourished. No distress.  HENT:  Head: Normocephalic and atraumatic.  Eyes: Conjunctivae are normal.  Neck: Neck supple.  Cardiovascular: Normal rate and regular rhythm.   No murmur heard. Pulmonary/Chest: Effort normal and breath sounds normal. No respiratory distress.  Abdominal: Soft. There is no tenderness.  Musculoskeletal: She exhibits tenderness (back / paraspinal). She exhibits no edema.  Neurological: She is alert.  Alert and oriented CN II-XII grossly intact Eyes: PERRL, EOMI Bilateral UE strength 5/5 Bilateral LE strength 5/5 and symmetric Intact gait   Skin: Skin is warm and dry.  Psychiatric: She has a normal mood and affect.  Nursing note and vitals reviewed.    ED Treatments / Results  Labs (all labs ordered are listed, but only abnormal results are displayed) Labs Reviewed  COMPREHENSIVE METABOLIC PANEL - Abnormal; Notable for the following:       Result Value   Potassium 3.0 (*)    Chloride 100 (*)    Creatinine, Ser 1.07 (*)    GFR calc non Af Amer 59 (*)    All other components within normal limits  LIPASE, BLOOD  CBC  URINALYSIS, ROUTINE W REFLEX MICROSCOPIC (NOT AT [facility])    EKG  EKG Interpretation None       Radiology No results found.  Procedures Procedures (including critical care time)  Medications Ordered in ED Medications - No data to display   Initial Impression / Assessment and Plan / ED Course  I have reviewed the triage vital signs and the nursing notes.  Pertinent labs & imaging results that were available during my care of the patient  were reviewed by me and considered in my medical decision making (see chart for details).  Clinical Course    This patient is requesting treatment for what she described as chronic back and abdominal pain.  On exam, there is no abdominal tenderness.  No concerning pieces in the history that would suggest emergent intraabdominal pathology.  Labs unremarkable.  No fevers.  For back pain, patient has normal neuro exam.  No concerning symptoms for acute spinal cord compression.  Nothing in history to suggest new pathology.  patient spoke with Cw.  Will give resources to establish new PCP.  We have discussed the discharge plan, including the plan for outpatient followup, and strict return precautions, including those that would require calling 911.     Final Clinical Impressions(s) / ED Diagnoses   Final diagnoses:  None  New Prescriptions New Prescriptions   No medications on file     Levada Schilling, MD 04/04/16 1843    Merrily Pew, MD 04/04/16 2352

## 2016-04-08 ENCOUNTER — Encounter: Payer: Self-pay | Admitting: Gastroenterology

## 2016-05-13 ENCOUNTER — Encounter: Payer: Self-pay | Admitting: Gastroenterology

## 2016-06-02 ENCOUNTER — Ambulatory Visit (INDEPENDENT_AMBULATORY_CARE_PROVIDER_SITE_OTHER): Payer: Medicare HMO | Admitting: Orthopaedic Surgery

## 2016-06-03 ENCOUNTER — Encounter: Payer: Self-pay | Admitting: Gastroenterology

## 2016-06-20 ENCOUNTER — Encounter: Payer: Self-pay | Admitting: Internal Medicine

## 2016-06-23 ENCOUNTER — Ambulatory Visit (INDEPENDENT_AMBULATORY_CARE_PROVIDER_SITE_OTHER): Payer: Medicare HMO | Admitting: Orthopaedic Surgery

## 2016-07-15 ENCOUNTER — Ambulatory Visit (INDEPENDENT_AMBULATORY_CARE_PROVIDER_SITE_OTHER): Payer: Medicare HMO | Admitting: Orthopaedic Surgery

## 2018-10-20 ENCOUNTER — Other Ambulatory Visit: Payer: Self-pay | Admitting: Registered Nurse

## 2018-10-20 ENCOUNTER — Inpatient Hospital Stay (HOSPITAL_COMMUNITY)
Admission: AD | Admit: 2018-10-20 | Discharge: 2018-10-22 | DRG: 897 | Disposition: A | Discharge status: Home / Self Care | Payer: Medicare HMO | Attending: Psychiatry | Admitting: Psychiatry

## 2018-10-20 ENCOUNTER — Encounter (HOSPITAL_COMMUNITY): Payer: Self-pay | Admitting: *Deleted

## 2018-10-20 ENCOUNTER — Other Ambulatory Visit: Payer: Self-pay

## 2018-10-20 DIAGNOSIS — F13239 Sedative, hypnotic or anxiolytic dependence with withdrawal, unspecified: Secondary | ICD-10-CM | POA: Diagnosis present

## 2018-10-20 DIAGNOSIS — Z8249 Family history of ischemic heart disease and other diseases of the circulatory system: Secondary | ICD-10-CM | POA: Diagnosis not present

## 2018-10-20 DIAGNOSIS — I10 Essential (primary) hypertension: Secondary | ICD-10-CM | POA: Diagnosis present

## 2018-10-20 DIAGNOSIS — F1324 Sedative, hypnotic or anxiolytic dependence with sedative, hypnotic or anxiolytic-induced mood disorder: Principal | ICD-10-CM | POA: Diagnosis present

## 2018-10-20 DIAGNOSIS — F3181 Bipolar II disorder: Secondary | ICD-10-CM

## 2018-10-20 DIAGNOSIS — Z833 Family history of diabetes mellitus: Secondary | ICD-10-CM | POA: Diagnosis not present

## 2018-10-20 DIAGNOSIS — F1320 Sedative, hypnotic or anxiolytic dependence, uncomplicated: Secondary | ICD-10-CM | POA: Diagnosis not present

## 2018-10-20 DIAGNOSIS — F332 Major depressive disorder, recurrent severe without psychotic features: Secondary | ICD-10-CM | POA: Diagnosis present

## 2018-10-20 DIAGNOSIS — Z818 Family history of other mental and behavioral disorders: Secondary | ICD-10-CM | POA: Diagnosis not present

## 2018-10-20 MED ORDER — IBUPROFEN 600 MG PO TABS
600.0000 mg | ORAL_TABLET | Freq: Four times a day (QID) | ORAL | Status: DC | PRN
  Administered 2018-10-20: 600 mg via ORAL
  Filled 2018-10-20: qty 1

## 2018-10-20 MED ORDER — IBUPROFEN 100 MG/5ML PO SUSP
600.0000 mg | Freq: Four times a day (QID) | ORAL | Status: DC | PRN
  Filled 2018-10-20: qty 30

## 2018-10-20 MED ORDER — TRAZODONE HCL 50 MG PO TABS
50.0000 mg | ORAL_TABLET | Freq: Every evening | ORAL | Status: DC | PRN
  Filled 2018-10-20 (×4): qty 1

## 2018-10-20 MED ORDER — HYDROXYZINE HCL 50 MG PO TABS
50.0000 mg | ORAL_TABLET | Freq: Every evening | ORAL | Status: DC | PRN
  Administered 2018-10-20: 50 mg via ORAL
  Filled 2018-10-20: qty 1

## 2018-10-20 MED ORDER — CHLORDIAZEPOXIDE HCL 25 MG PO CAPS
25.0000 mg | ORAL_CAPSULE | Freq: Four times a day (QID) | ORAL | Status: DC | PRN
  Administered 2018-10-20: 25 mg via ORAL
  Filled 2018-10-20: qty 1

## 2018-10-20 MED ORDER — MAGNESIUM HYDROXIDE 400 MG/5ML PO SUSP
30.0000 mL | Freq: Every day | ORAL | Status: DC | PRN
  Administered 2018-10-20: 30 mL via ORAL
  Filled 2018-10-20: qty 30

## 2018-10-20 NOTE — Progress Notes (Signed)
Dorothy Alvarez is a 53 year old female pt admitted on voluntary basis after presenting as a walk-in. On admission, she is tearful with tangential thought process. She denies SI and is able to contract for safety while in the hospital. She spoke about how she is feeling paranoid and feels that there are people who are out to get her. She also reports that she has been taking xanax and ambien and reports that she went to her doctor's office and she reports that her doctor told her he had gotten his license revoked and he could not prescribe her medications any longer. She reports that she had been taking xanax for about 20 years and her last dose was 2 days ago. She reports that she tried to withdraw from the medication by herself at home but reports that she can't do it, is very nervious and anxious and has not been able to sleep the past couple days. She also reports that she goes to pain management and reports that she takes vicoprofen and stated that she had a dose earlier today. She reports that she lives at home with her son and reports that she will return there once she is discharged. Dorothy Alvarez was cooperative during admission process, was oriented to the milieu and safety maintained.

## 2018-10-20 NOTE — Tx Team (Signed)
Initial Treatment Plan 10/20/2018 6:59 PM [name] [id number]    PATIENT STRESSORS: Medication change or noncompliance   PATIENT STRENGTHS: Ability for insight Average or above average intelligence Capable of independent living General fund of knowledge Motivation for treatment/growth   PATIENT IDENTIFIED PROBLEMS: Paranoia Anxiety "My doctor said he got his license revoked and could not prescribe my medications"                     DISCHARGE CRITERIA:  Ability to meet basic life and health needs Improved stabilization in mood, thinking, and/or behavior Verbal commitment to aftercare and medication compliance Withdrawal symptoms are absent or subacute and managed without 24-hour nursing intervention  PRELIMINARY DISCHARGE PLAN: Attend aftercare/continuing care group Return to previous living arrangement  PATIENT/FAMILY INVOLVEMENT: This treatment plan has been presented to and reviewed with the patient, Dorothy Alvarez, and/or family member, .  The patient and family have been given the opportunity to ask questions and make suggestions.  Dorothy Alvarez, [place], [place] 10/20/2018, 6:59 PM

## 2018-10-20 NOTE — H&P (Signed)
Behavioral Health Medical Screening Exam  Dorothy Alvarez is an 53 y.o. female patient presents to [facility] as a walk-in with complaints of worsening depression and anxiety.  Patient also states she is having some paranoia that someone is trying to hurt her; and that she has not been sleeping.  Patient states she does not feel safe at home.  Patient reports living with her husband and her son.  Reports prior psychiatric history but unable to give detailed report related to some thought blocking.  Patient did state that her outpatient psychiatric provider stopped her Ambien and Xanax and she felt that maybe she could wean herself off.  States that the paranoia and the worsening of depression started after stopping the Ambien and Xanax also her not being able to sleep.  Total Time spent with patient: 30 minutes  Psychiatric Specialty Exam: Physical Exam  Vitals reviewed. Constitutional: She is oriented to person, place, and time. She appears well-developed and well-nourished.  Neck: Normal range of motion.  Cardiovascular:  Elevated blood pressure  Respiratory: Effort normal.  Musculoskeletal: Normal range of motion.  Neurological: She is alert and oriented to person, place, and time.  Skin: Skin is warm and dry.    Review of Systems  Psychiatric/Behavioral: Positive for depression. Substance abuse:  Denies. Suicidal ideas:  Denies. The patient is nervous/anxious.        Patient reports that she feels that somebody is trying to harm her and that she does not feel safe at home.  Patient reports some homicidal thoughts not towards anyone and specific but to protect herself.  Feels the people who may be trying to hurt her as her family  All other systems reviewed and are negative.   Blood pressure (!) 157/104, pulse 78, temperature 98.2 F (36.8 C), resp. rate 18, SpO2 99 %.There is no height or weight on file to calculate BMI.  General Appearance: Casual  Eye Contact:  Fair  Speech:  Clear and  Coherent and Thought blocking  Volume:  Normal  Mood:  Anxious, Depressed and Hopeless  Affect:  Depressed and Flat  Thought Process:  Coherent  Orientation:  Full (Time, Place, and Person)  Thought Content:  Paranoid Ideation  Suicidal Thoughts:  No  Homicidal Thoughts:  Denies homicidal ideation but feels that someone is trying to hurt her and that she will protect her self.  Was unable to tell whether she had weapons in the home or not  Memory:  Immediate;   Fair Recent;   Fair Remote;   Fair  Judgement:  Impaired  Insight:  Lacking  Psychomotor Activity:  Decreased  Concentration: Concentration: Poor and Attention Span: Poor  Recall:  [employer] of Knowledge:Fair  Language: Good  Akathisia:  No  Handed:  Right  AIMS (if indicated):     Assets:  Desire for Improvement Housing Social Support  Sleep:       Musculoskeletal: Strength & Muscle Tone: within normal limits Gait & Station: normal Patient leans: N/A  Blood pressure (!) 157/104, pulse 78, temperature 98.2 F (36.8 C), resp. rate 18, SpO2 99 %.  Recommendations: Inpatient psychiatric treatment.  Patient unable to give a list of her home medications at this time.  Based on my evaluation the patient does not appear to have an emergency medical condition.  Jaemarie Hochberg, NP 10/20/2018, 6:07 PM
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
## 2018-10-21 DIAGNOSIS — F3181 Bipolar II disorder: Secondary | ICD-10-CM

## 2018-10-21 DIAGNOSIS — F1320 Sedative, hypnotic or anxiolytic dependence, uncomplicated: Secondary | ICD-10-CM

## 2018-10-21 LAB — HEMOGLOBIN A1C
Hgb A1c MFr Bld: 5.6 % (ref 4.8–5.6)
MEAN PLASMA GLUCOSE: 114.02 mg/dL

## 2018-10-21 LAB — COMPREHENSIVE METABOLIC PANEL
ALT: 25 U/L (ref 0–44)
AST: 26 U/L (ref 15–41)
Albumin: 4 g/dL (ref 3.5–5.0)
Alkaline Phosphatase: 64 U/L (ref 38–126)
Anion gap: 9 (ref 5–15)
BUN: 14 mg/dL (ref 6–20)
CO2: 33 mmol/L — ABNORMAL HIGH (ref 22–32)
CREATININE: 0.84 mg/dL (ref 0.44–1.00)
Calcium: 9.2 mg/dL (ref 8.9–10.3)
Chloride: 102 mmol/L (ref 98–111)
GFR calc Af Amer: >60 mL/min (ref >60)
GLUCOSE: 100 mg/dL — AB (ref 70–99)
POTASSIUM: 2.9 mmol/L — AB (ref 3.5–5.1)
Sodium: 144 mmol/L (ref 135–145)
Total Bilirubin: 1 mg/dL (ref 0.3–1.2)
Total Protein: 6.7 g/dL (ref 6.5–8.1)

## 2018-10-21 LAB — CBC
HCT: 39 % (ref 36.0–46.0)
Hemoglobin: 12.6 g/dL (ref 12.0–15.0)
MCH: 30.9 pg (ref 26.0–34.0)
MCHC: 32.3 g/dL (ref 30.0–36.0)
MCV: 95.6 fL (ref 80.0–100.0)
NRBC: 0 % (ref 0.0–0.2)
PLATELETS: 307 10*3/uL (ref 150–400)
RBC: 4.08 MIL/uL (ref 3.87–5.11)
RDW: 13.1 % (ref 11.5–15.5)
WBC: 6.6 10*3/uL (ref 4.0–10.5)

## 2018-10-21 LAB — LIPID PANEL
Cholesterol: 281 mg/dL — ABNORMAL HIGH (ref 0–200)
HDL: 46 mg/dL (ref >40)
LDL Cholesterol: 217 mg/dL — ABNORMAL HIGH (ref 0–99)
Total CHOL/HDL Ratio: 6.1 RATIO
Triglycerides: 91 mg/dL (ref <150)
VLDL: 18 mg/dL (ref 0–40)

## 2018-10-21 LAB — ETHANOL: Alcohol, Ethyl (B): <10 mg/dL (ref <10)

## 2018-10-21 LAB — TSH: TSH: 2.002 u[IU]/mL (ref 0.350–4.500)

## 2018-10-21 MED ORDER — FUROSEMIDE 20 MG PO TABS: 20.0000 mg | ORAL_TABLET | Freq: Every day | ORAL | Status: DC

## 2018-10-21 MED ORDER — CHLORPROMAZINE HCL 25 MG PO TABS
50.0000 mg | ORAL_TABLET | Freq: Three times a day (TID) | ORAL | Status: DC | PRN
  Administered 2018-10-21: 50 mg via ORAL
  Filled 2018-10-21: qty 2

## 2018-10-21 MED ORDER — LOPERAMIDE HCL 2 MG PO CAPS: 2.0000 mg | ORAL_CAPSULE | ORAL | Status: DC | PRN

## 2018-10-21 MED ORDER — LORATADINE 10 MG PO TABS
10.0000 mg | ORAL_TABLET | Freq: Every day | ORAL | Status: DC
  Administered 2018-10-21 – 2018-10-22 (×2): 10 mg via ORAL
  Filled 2018-10-21 (×4): qty 1

## 2018-10-21 MED ORDER — LORAZEPAM 1 MG PO TABS: 1.0000 mg | ORAL_TABLET | Freq: Two times a day (BID) | ORAL | Status: DC

## 2018-10-21 MED ORDER — VITAMIN B-1 100 MG PO TABS
100.0000 mg | ORAL_TABLET | Freq: Every day | ORAL | Status: DC
  Filled 2018-10-21 (×2): qty 1

## 2018-10-21 MED ORDER — FUROSEMIDE 40 MG PO TABS
40.0000 mg | ORAL_TABLET | Freq: Every day | ORAL | Status: DC
  Administered 2018-10-21 – 2018-10-22 (×2): 40 mg via ORAL
  Filled 2018-10-21 (×4): qty 1

## 2018-10-21 MED ORDER — HYDROXYZINE HCL 25 MG PO TABS
25.0000 mg | ORAL_TABLET | Freq: Four times a day (QID) | ORAL | Status: DC | PRN
  Administered 2018-10-21 – 2018-10-22 (×2): 25 mg via ORAL
  Filled 2018-10-21: qty 1

## 2018-10-21 MED ORDER — LORAZEPAM 1 MG PO TABS: 1.0000 mg | ORAL_TABLET | Freq: Three times a day (TID) | ORAL | Status: DC

## 2018-10-21 MED ORDER — LORAZEPAM 1 MG PO TABS: 1.0000 mg | ORAL_TABLET | Freq: Four times a day (QID) | ORAL | Status: DC | PRN

## 2018-10-21 MED ORDER — CHLORPROMAZINE HCL 50 MG PO TABS
50.0000 mg | ORAL_TABLET | Freq: Every evening | ORAL | Status: DC | PRN
  Filled 2018-10-21 (×4): qty 1

## 2018-10-21 MED ORDER — CHLORPROMAZINE HCL 25 MG PO TABS: 25.0000 mg | ORAL_TABLET | Freq: Every evening | ORAL | Status: DC | PRN

## 2018-10-21 MED ORDER — CHLORPROMAZINE HCL 25 MG PO TABS
25.0000 mg | ORAL_TABLET | Freq: Three times a day (TID) | ORAL | Status: DC | PRN
  Administered 2018-10-21: 25 mg via ORAL

## 2018-10-21 MED ORDER — LORAZEPAM 1 MG PO TABS
1.0000 mg | ORAL_TABLET | Freq: Four times a day (QID) | ORAL | Status: DC | PRN
  Administered 2018-10-22: 1 mg via ORAL
  Filled 2018-10-21 (×2): qty 1

## 2018-10-21 MED ORDER — TRAZODONE HCL 50 MG PO TABS: 50.0000 mg | ORAL_TABLET | Freq: Every evening | ORAL | Status: DC | PRN

## 2018-10-21 MED ORDER — IRBESARTAN 75 MG PO TABS
75.0000 mg | ORAL_TABLET | Freq: Every day | ORAL | Status: DC
  Administered 2018-10-21 – 2018-10-22 (×2): 75 mg via ORAL
  Filled 2018-10-21 (×4): qty 1

## 2018-10-21 MED ORDER — MELOXICAM 7.5 MG PO TABS
7.5000 mg | ORAL_TABLET | Freq: Two times a day (BID) | ORAL | Status: DC
  Administered 2018-10-21 – 2018-10-22 (×3): 7.5 mg via ORAL
  Filled 2018-10-21 (×8): qty 1

## 2018-10-21 MED ORDER — ADULT MULTIVITAMIN W/MINERALS CH
1.0000 | ORAL_TABLET | Freq: Every day | ORAL | Status: DC
  Administered 2018-10-21 – 2018-10-22 (×2): 1 via ORAL
  Filled 2018-10-21 (×4): qty 1

## 2018-10-21 MED ORDER — TETRAHYDROZOLINE HCL 0.05 % OP SOLN
1.0000 [drp] | Freq: Three times a day (TID) | OPHTHALMIC | Status: DC
  Administered 2018-10-21 – 2018-10-22 (×4): 1 [drp] via OPHTHALMIC
  Filled 2018-10-21: qty 15

## 2018-10-21 MED ORDER — TRAZODONE HCL 100 MG PO TABS
100.0000 mg | ORAL_TABLET | Freq: Once | ORAL | Status: DC
  Filled 2018-10-21: qty 1

## 2018-10-21 MED ORDER — FLUTICASONE PROPIONATE 50 MCG/ACT NA SUSP
1.0000 | Freq: Every day | NASAL | Status: DC
  Administered 2018-10-21: 1 via NASAL
  Filled 2018-10-21: qty 16

## 2018-10-21 MED ORDER — VITAMIN B-1 100 MG PO TABS
100.0000 mg | ORAL_TABLET | Freq: Every day | ORAL | Status: DC
  Administered 2018-10-22: 100 mg via ORAL
  Filled 2018-10-21 (×3): qty 1

## 2018-10-21 MED ORDER — LORAZEPAM 1 MG PO TABS: 1.0000 mg | ORAL_TABLET | Freq: Four times a day (QID) | ORAL | Status: DC

## 2018-10-21 MED ORDER — LORAZEPAM 1 MG PO TABS
1.0000 mg | ORAL_TABLET | Freq: Two times a day (BID) | ORAL | Status: DC
  Administered 2018-10-21 – 2018-10-22 (×2): 1 mg via ORAL
  Filled 2018-10-21: qty 1

## 2018-10-21 MED ORDER — POTASSIUM CHLORIDE CRYS ER 20 MEQ PO TBCR
20.0000 meq | EXTENDED_RELEASE_TABLET | Freq: Two times a day (BID) | ORAL | Status: DC
  Administered 2018-10-21 – 2018-10-22 (×3): 20 meq via ORAL
  Filled 2018-10-21 (×4): qty 1

## 2018-10-21 MED ORDER — ADULT MULTIVITAMIN W/MINERALS CH
1.0000 | ORAL_TABLET | Freq: Every day | ORAL | Status: DC
  Administered 2018-10-21: 1 via ORAL
  Filled 2018-10-21 (×3): qty 1

## 2018-10-21 MED ORDER — LORAZEPAM 1 MG PO TABS
1.0000 mg | ORAL_TABLET | Freq: Once | ORAL | Status: AC
  Administered 2018-10-21: 1 mg via ORAL
  Filled 2018-10-21: qty 1

## 2018-10-21 MED ORDER — LORAZEPAM 1 MG PO TABS: 1.0000 mg | ORAL_TABLET | Freq: Every day | ORAL | Status: DC

## 2018-10-21 MED ORDER — CHLORPROMAZINE HCL 50 MG PO TABS
50.0000 mg | ORAL_TABLET | Freq: Two times a day (BID) | ORAL | Status: DC
  Administered 2018-10-21: 50 mg via ORAL
  Filled 2018-10-21 (×4): qty 1

## 2018-10-21 MED ORDER — ONDANSETRON 4 MG PO TBDP: 4.0000 mg | ORAL_TABLET | Freq: Four times a day (QID) | ORAL | Status: DC | PRN

## 2018-10-21 MED ORDER — THIAMINE HCL 100 MG/ML IJ SOLN: 100.0000 mg | Freq: Once | INTRAMUSCULAR | Status: DC
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
## 2018-10-21 NOTE — Progress Notes (Signed)
Pt walked quickly out dayroom said I need to go to the bathroom. After returning pt said I had gas and I finally moved my bowels. The prune juice did work. Staff encourage pt to drink lots of water. Pt said she willl drink water.

## 2018-10-21 NOTE — Plan of Care (Addendum)
D: Patient is in hallway on approach. Patient is cooperative. Denies SI, HI, AVH, and verbally contracts for safety. Reports chronic low back pain rated 10/10. Objective/subjective withdrawal symptoms of headache 10/10, high anxiety, confusion/disorientation/clouding, mild visible tremor, irritability, and sweating. Patient is easily redirected but having trouble sleeping and irritated and confused.      A: PRN medications administered per MD order. Support provided. Patient educated on safety on the unit and medications. Routine safety checks every 15 minutes. Patient stated understanding to tell nurse about any new physical symptoms. Patient understands to tell staff of any needs.     R: No adverse drug reactions noted. Patient verbally contracts for safety. Patient remains safe at this time and will continue to monitor.   Problem: Education: Goal: Knowledge of [facility] Education information/materials will improve Outcome: Progressing   Problem: Safety: Goal: Periods of time without injury will increase Outcome: Progressing   Patient oriented to the unit. Patient remains safe and will continue to monitor.

## 2018-10-21 NOTE — Progress Notes (Signed)
DAR Note: Pt observed in bed on initial approach. Presents agitated, argumentative, very needy with blunted affect on interactions. Pt was dismissive to Market researcher intermittently this shift "I don't want to talk to y'all, I need a doctor, I'm tired talking about my issues to every body, look at the papers for my medicines, I'm done". Pt's BP has been elevated. Pt refused to have her BP rechecked post anti-hypertensive "I'm fine, it's because I'm here, I need to be released tomorrow, this is not good for me". Emotional support and reassurance provided as needed. Scheduled medications given per provider's order with verbal education and effects monitored. Assigned provider made aware of pt's elevated BP readings as well as her refusal to recheck BP. No new orders given at this time. Safety checks maintained without incident.  Pt's mood remains labile. Approached writer this evening and was apologetic for her behavior earlier this shift "I want a hug, the Lord told me to talk to you & hug you, I'm sorry, let's start over, you did your job, you should understand what I I'm going through". Cooperative with unit routines. Compliant with medications when offered. Denies side effects. Attended scheduled groups this afternoon. Went off unit for meals and activities.Tolerated all PO intake well. Remains safe on and off unit. POC continues for safety and mood stability.

## 2018-10-21 NOTE — BHH Group Notes (Signed)
[facility] Mental [facility] Association Group Therapy      10/21/2018 2:01 PM  Type of Therapy: Mental Health Association Presentation  Participation Level: Active  Participation Quality: Attentive  Affect: Appropriate  Cognitive: Oriented  Insight: Developing/Improving  Engagement in Therapy: Engaged  Modes of Intervention: Discussion, Education and Socialization  Summary of Progress/Problems: [place] ([place] Speaker came to talk about his personal journey with mental health. The pt processed ways by which to relate to the speaker. [place] speaker provided handouts and educational information pertaining to groups and services offered by the [facility]. Pt was engaged in speaker's presentation and was receptive to resources provided.    [place] Social Worker

## 2018-10-21 NOTE — Progress Notes (Addendum)
Pt is currently agitated/irritable/demanding at this time. Pt states she needs to go to ED with c/o of swelling in her foot. Pt states she hasn't had a bowel movement in 3 days. Per MHT, Pt had a bowel movement yesterday relieved with prune juice. Pt was told to prop both foot up with pillow to help with swelling. Pt states "I need medical attention and this place is not equip to handle my concerns". Provider on call notified. Trazodone 1X was ordered. Pt states she can't take because it gives her the opposite effect. Pt states she can't take antidepressants. Provider on call Patriciaann Clan gave okay to give PRN Ativan 1 mg. Pt remains labile/restless. Pt ask for crackers and fluids; both given. Pt gave UA; pending results. Pt went to go lay down.
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
## 2018-10-21 NOTE — Progress Notes (Signed)
Pt attended wrap-up group tonight. Pt appears animated/labile in affect and mood. Pt denies SI/HI/AVH/Pain at this time. Pt is loud and tangential in speech. Pt is preoccupied about taking next schedule dose of medications. Pt states she hopes to be d/c tomorrow pending that she has a restful sleep. Pt was encourage to provide UA. BP remains elevated; provider on call notified. Pt is asymptomatic. Will re-check later. Pt is seen interacting with other peers in dayroom joking and laughing. Support provided. Will continue with POC.

## 2018-10-22 DIAGNOSIS — F3181 Bipolar II disorder: Secondary | ICD-10-CM

## 2018-10-22 DIAGNOSIS — F1320 Sedative, hypnotic or anxiolytic dependence, uncomplicated: Secondary | ICD-10-CM

## 2018-10-22 MED ORDER — AMLODIPINE BESYLATE 10 MG PO TABS
10.0000 mg | ORAL_TABLET | Freq: Every day | ORAL | Status: DC
  Administered 2018-10-22: 10 mg via ORAL
  Filled 2018-10-22 (×3): qty 1

## 2018-10-22 MED ORDER — HYDROXYZINE HCL 25 MG PO TABS: 25.0000 mg | ORAL_TABLET | Freq: Four times a day (QID) | ORAL | 0 refills | Status: DC | PRN

## 2018-10-22 MED ORDER — AMLODIPINE BESYLATE 10 MG PO TABS: 10.0000 mg | ORAL_TABLET | Freq: Every day | ORAL | 1 refills | Status: DC

## 2018-10-22 MED ORDER — FLUTICASONE PROPIONATE 50 MCG/ACT NA SUSP: 1.0000 | Freq: Every day | NASAL | 0 refills | Status: DC

## 2018-10-22 MED ORDER — ADULT MULTIVITAMIN W/MINERALS CH: 1.0000 | ORAL_TABLET | Freq: Every day | ORAL | Status: DC

## 2018-10-22 MED ORDER — LORATADINE 10 MG PO TABS: 10.0000 mg | ORAL_TABLET | Freq: Every day | ORAL | Status: DC

## 2018-10-22 MED ORDER — POTASSIUM CHLORIDE CRYS ER 20 MEQ PO TBCR
20.0000 meq | EXTENDED_RELEASE_TABLET | Freq: Every day | ORAL | Status: DC
  Filled 2018-10-22 (×2): qty 1

## 2018-10-22 MED ORDER — IRBESARTAN 75 MG PO TABS: 75.0000 mg | ORAL_TABLET | Freq: Every day | ORAL | 1 refills | Status: DC

## 2018-10-22 MED ORDER — CHLORPROMAZINE HCL 50 MG PO TABS
50.0000 mg | ORAL_TABLET | Freq: Two times a day (BID) | ORAL | Status: DC
  Filled 2018-10-22 (×4): qty 1

## 2018-10-22 MED ORDER — MELOXICAM 7.5 MG PO TABS: 7.5000 mg | ORAL_TABLET | Freq: Two times a day (BID) | ORAL | 1 refills | Status: DC

## 2018-10-22 MED ORDER — TETRAHYDROZOLINE HCL 0.05 % OP SOLN: 1.0000 [drp] | Freq: Three times a day (TID) | OPHTHALMIC | 0 refills | Status: DC | PRN

## 2018-10-22 MED ORDER — LORAZEPAM 1 MG PO TABS: 1.0000 mg | ORAL_TABLET | Freq: Four times a day (QID) | ORAL | 0 refills | Status: DC | PRN

## 2018-10-22 MED ORDER — CHLORPROMAZINE HCL 50 MG PO TABS: 50.0000 mg | ORAL_TABLET | Freq: Two times a day (BID) | ORAL | 1 refills | Status: DC

## 2018-10-22 NOTE — Progress Notes (Addendum)
  [facility] Adult Case Management Discharge Plan :  Will you be returning to the same living situation after discharge:  Yes,  patient reports she is returning home at discahrge At discharge, do you have transportation home?: Yes,  patient reports her car is in the [facility] parking lot Do you have the ability to pay for your medications: Yes,  [employer] Medicare; SSDI  Release of information consent forms completed and in the chart;  Patient's signature needed at discharge.  Patient to Follow up at: Follow-up Information    [facility]. Call.   Why:  Please call  the number listed to schedule an appointment for a sleep assessment.  Contact information: [address] [place] [phone]       PATIENT DECLINED [place] [facility] Follow up.   Why:  Patient reports that she does not want to be referred to any outpatient mental health providers at this time. Patient states she will follow up with an outpatient provider on her own.  Contact information: PATIENT DECLINED [place]          Next level of care provider has access to [place] and Suicide Prevention discussed: Yes,  with the patient  Have you used any form of tobacco in the last 30 days? (Cigarettes, Smokeless Tobacco, Cigars, and/or Pipes): No  Has patient been referred to the Quitline?: N/A patient is not a smoker  Patient has been referred for addiction treatment: N/A  Marylee Floras, [place] 10/22/2018, 3:08 PM

## 2018-10-22 NOTE — [facility] Suicide Risk Assessment (Addendum)
[facility] Discharge Suicide Risk Assessment   Principal Problem: Benzodiazepine dependence ([place]) Discharge Diagnoses: Principal Problem:   Benzodiazepine dependence ([place]) Active Problems:   MDD (major depressive disorder), recurrent episode, severe ([place])   Total Time spent with patient: 30 minutes  Musculoskeletal: Strength & Muscle Tone: within normal limits-no current tremors, no diaphoresis, no psychomotor restlessness, no agitation, Gait & Station: normal Patient leans: N/A  Psychiatric Specialty Exam: ROS no headache, no chest pain, no shortness of breath, no vomiting  Blood pressure (!) 150/99, pulse (!) 102, temperature 98.2 F (36.8 C), temperature source Oral, resp. rate 20, height 5\' 5"  (1.651 m), weight 98 kg, SpO2 99 %.Body mass index is 35.94 kg/m.  General Appearance: Fairly groomed  Engineer, water::  Improving eye contact  Speech:  Normal Rate409  Volume:  Normal  Mood:  States she is feeling better today, remains vaguely dysphoric/irritable  Affect:  Mild irritability at this time, improves as session progresses, smiles appropriately during session  Thought Process:  Linear and Descriptions of Associations: Intact  Orientation:  Other:  She is fully alert and attentive  Thought Content:  No hallucinations, no delusions, not internally preoccupied  Suicidal Thoughts:  No denies self-injurious or suicidal thoughts/also denies homicidal ideations, denies any violent ideations  Homicidal Thoughts:  No  Memory:  Recent and remote grossly intact  Judgement:  Fair  Insight:  Fair  Psychomotor Activity:  Normal-no current significant tremors or psychomotor restlessness  Concentration:  Fair  Recall:  [employer] of Knowledge:Fair  Language: Good  Akathisia:  Negative  Handed:  Right  AIMS (if indicated):     Assets:  Desire for Improvement Resilience  Sleep:  Number of Hours: 5.5  Cognition: WNL  ADL's:  Intact   Mental Status Per Nursing Assessment::   On Admission:   NA  Demographic Factors:  53 year old female, married, 2 adult children, on disability  Loss Factors: Reportsbeing taken off Xanax and Ambien by the outpatient prescribing physician in December. Limited support network.  Disability.   Historical Factors: 1 prior psychiatric admission in 2003 for depression, prior history of bipolar disorder diagnoses, as above long history of benzodiazepine management.  Risk Reduction Factors:   Living with another person, especially a relative and Positive coping skills or problem solving skills  Continued Clinical Symptoms:  Today patient is alert, attentive, fairly groomed, describes feeling partially better than she did prior to admission.  Mood is vaguely dysphoric, affect is congruent/tends to improve during session, smiles at times appropriately.  No thought disorder, denies suicidal ideations, no homicidal ideations, no hallucinations, no delusions,. Today no disruptive or agitated behaviors on unit, polite on approach.  Of note, staff reports patient refused a.m. labs. At this time patient is requesting discharge, interested in continuing outpatient treatment with her outpatient treatment team. Currently she is not presenting with symptoms of benzodiazepine withdrawal-has no tremors, no diaphoresis, no psychomotor agitation, noted to be sitting comfortably throughout session, blood pressure is elevated-patient has history of hypertension.  Of note, I have clarified with patient when her last Xanax intake was-she states it was 3 to 4 days prior to coming to hospital .  Patient reports no side effects with Thorazine, which she received twice yesterday.  We have reviewed side effect profile.  She continues to complain of insomnia as a chronic issue but does state that she slept partially better on Thorazine. Remains focused on benzodiazepines, hoping for Ativan prescription at discharge.  We reviewed rationale to avoid long-term benzodiazepine  management/risks associated with said long-term treatment. We reviewed importance of medication compliance, in particular regarding antihypertensive treatment.  I have reconsulted with hospitalist service-recommendations have been made regarding antihypertensive management-recommendation is to continue Lasix, daily potassium supplement, Avapro, and add Norvasc. No contraindications for discharge from medical perspective.  We have reviewed medication side effects, including risk of hypotension/orthostasis/sedation. I reviewed potential risk of benzodiazepine withdrawal, although at this time less likely based on report of last use of alprazolam 5 to 6 days ago   Cognitive Features That Contribute To Risk:  No gross cognitive deficits noted upon discharge. Is alert , attentive, and oriented x 3   Suicide Risk:  Mild:  Suicidal ideation of limited frequency, intensity, duration, and specificity.  There are no identifiable plans, no associated intent, mild dysphoria and related symptoms, good self-control (both objective and subjective assessment), few other risk factors, and identifiable protective factors, including available and accessible social support.  Follow-up Information    [facility] Follow up.   Why:  Please call to schedule appointment for sleep. Contact information: [address] [place] [phone]       [facility] Follow up.   Why:  CSW attempted to obtain appointment. NO answer. CSW will continue to follow.  Contact information: [address] [place] [address] [phone]           Plan Of Care/Follow-up recommendations:  Activity:  As tolerated Diet:  Heart healthy Tests:  NA Other:  See below Patient is requesting discharge-I have no current grounds for involuntary commitment.  She plans to return home.  Referral as above-patient plans to follow-up with Dr. Altamese Coldiron for outpatient psychiatric care , with  her primary care doctor for medical management.  I have emphasized importance of seeing her PCP within the next week to follow-up on blood pressure and recent antihypertensive medication changes.  She also plans to follow-up with her outpatient pain clinic.  Patient requesting referral to sleep disorders clinic, as describes long history of insomnia.   Jenne Campus, MD 10/22/2018, 12:56 PM

## 2018-10-22 NOTE — Progress Notes (Signed)
Pt is asleep

## 2018-10-22 NOTE — Progress Notes (Signed)
Pt denied SI/HI. Pt noted to be calm and cooperative with Probation officer during the discharge process. Pt received both written and verbal discharge instructions. Pt verbalized understanding of discharge instructions. Pt agreed to f/u appt and med regimen. Pt received SRA, AVS, transitional record and a suicide prevention worksheet. Pt gathered belongings from room and locker. Pt safely discharged to the lobby. Pt stated that her vehicle is in the parking lot and that she drove herself to the hospital.

## 2018-10-22 NOTE — Discharge Summary (Addendum)
Physician Discharge Summary Note  Patient:  Dorothy Alvarez is an 53 y.o., female MRN:  [id number] DOB:  [birth year] Patient phone:  [phone] (home)  Patient address:   [address] [place] [address],  Total Time spent with patient: Greater than 30 minutes  Date of Admission:  10/20/2018 Date of Discharge: 10/22/2018  Reason for Admission:  Outpatient provider discontinued Xanax and Ambien  Principal Problem: Benzodiazepine dependence ([place]) Discharge Diagnoses: Principal Problem:   Benzodiazepine dependence ([place]) Active Problems:   MDD (major depressive disorder), recurrent episode, severe ([place])   Bipolar II disorder ([place])   Past Psychiatric History: Per admission H&P: one prior psychiatric admission in 2003 for depression. Denies history of suicide attempt, denies history of self cutting , denies history of psychosis. Reports prior history of Bipolar Disorder diagnosis , and does endorse brief episodes suggestive of hypomania. Reports history of PTSD as above.  Reports history of chronic depression, which waxes and wanes. She does endorse episodes of increased irritability, racing thoughts, inability to sleep. Long history of benzodiazepine dependence.  Past Medical History:  Past Medical History:  Diagnosis Date  . Allergy   . Anxiety   . Arthritis   . Chronic abdominal pain   . Constipation   . Depression   . Hypertension   . IBS (irritable bowel syndrome)   . Learning disability   . Neuromuscular disorder [facility])     Past Surgical History:  Procedure Laterality Date  . ABDOMINAL HYSTERECTOMY    . GALLBLADDER SURGERY    . KNEE SURGERY     Family History:  Family History  Problem Relation Age of Onset  . Diabetes Mother   . Suicidality Mother   . Heart disease Father   . Depression Father    Family Psychiatric  History: Per admission H&P: mother has history of depression and reportedly attempted suicide when patient was a child. No known alcohol or drug abuse  in family Social History:  Social History   Substance and Sexual Activity  Alcohol Use No     Social History   Substance and Sexual Activity  Drug Use Yes  . Types: Other-see comments, Marijuana   Comment: opiates    Social History   Socioeconomic History  . Marital status: Legally Separated    Spouse name: Not on file  . Number of children: 2  . Years of education: Not on file  . Highest education level: Not on file  Occupational History  . Occupation: DISABLED    Employer: UNEMPLOYED  Social Needs  . Financial resource strain: Not on file  . Food insecurity:    Worry: Not on file    Inability: Not on file  . Transportation needs:    Medical: Not on file    Non-medical: Not on file  Tobacco Use  . Smoking status: Never Smoker  . Smokeless tobacco: Never Used  Substance and Sexual Activity  . Alcohol use: No  . Drug use: Yes    Types: Other-see comments, Marijuana    Comment: opiates  . Sexual activity: Not Currently    Birth control/protection: Surgical  Lifestyle  . Physical activity:    Days per week: Not on file    Minutes per session: Not on file  . Stress: Not on file  Relationships  . Social connections:    Talks on phone: Not on file    Gets together: Not on file    Attends religious service: Not on file    Active member of  club or organization: Not on file    Attends meetings of clubs or organizations: Not on file    Relationship status: Not on file  Other Topics Concern  . Not on file  Social History Narrative  . Not on file    Hospital Course:  From admission H&P 10/21/2018: 53 year old female. States she has been prescribed Xanax , Ambien for " a long time" ( years ) . States she had seen her outpatient  Psychiatrist/prescribed in December and told that those medications were not going to be prescribed any longer . States she still had a refill left and states " I have been trying to stretch it as much as I can, wean off myself , but I feel I  cannot do it on my own". States she has been taking up to 6 mgr of Xanax per day, and has been taking Ambien on most nights . States she last used these 3 days ago. In addition to above, reports she has been depressed, sad, with significant subjective irritability/dysphoria , brief mood swings and a subjective feeling of " racing thoughts" . Denies suicidal ideations. Endorses neuro-vegetative symptoms as below. In particular complains of poor sleep. Denies psychotic symptoms. Currently presents calm,  not presenting with psychomotor agitation or restlessness, no distal tremors , BP 173/104, pulse 81. Also, does not endorse any clear symptoms of opiate WDL- in fact complains of constipation as a major symptom.   Ms. Dieguez was admitted after outpatient provider discontinued Xanax and Ambien. She had been taking Xanax 2 mg TID and Ambien 12.5 mg QHS. She also receives hydrocodone-ibuprofen 10-200 from a pain clinic. She reported provider had stopped prescribing Xanax and Ambien in December, and she had been trying to wean herself off with the last refill. She reported last taking these medications 3 days prior to admission and has no more at this time. She was started on Thorazine, PRN Vistaril, and CIWA protocol with Ativan taper. She was agitated and demanding with staff during admission, demanding Xanax- "I need Xanax to sleep." Risks of Xanax were discussed with patient and reviewed reasons for not prescribing, including patient currently managed with narcotics in pain clinic and not having an outpatient provider who will continue Xanax. On assessment today patient denies SI/HI/AVH and requests discharge home. She denies withdrawal symptoms. No diaphoresis or tremors observed. Last CIWA was 5. Patient is discharging home with #2 Ativan 1 mg tabs to finish detox protocol. Hospitalist consulted regarding patient's high blood pressure and low potassium on admission (patient refused follow-up BMP this AM).  Hospitalist recommended adding Norvasc 10 mg PO daily, as well as KDur 20 meq PO daily with her Lasix. Ms. Savin remained on the [facility] unit for 2 days. She stabilized with medications and therapy. She was discharged on the medications listed below. She denies any SI/HI/AVH and contracts for safety. Patient refused referrals for follow-up for behavioral health. She agrees to follow up with [facility] (see below). Patient is provided with prescriptions for medications upon discharge. Her [facility] is [facility] at [facility], and she is driving herself home.  Physical Findings: AIMS: Facial and Oral Movements Muscles of Facial Expression: None, normal Lips and Perioral Area: None, normal Jaw: None, normal Tongue: None, normal,Extremity Movements Upper (arms, wrists, hands, fingers): None, normal Lower (legs, knees, ankles, toes): None, normal, Trunk Movements Neck, shoulders, hips: None, normal, Overall Severity Severity of abnormal movements (highest score from questions above): None, normal Incapacitation due to abnormal movements:  None, normal Patient's awareness of abnormal movements (rate only patient's report): No Awareness, Dental Status Current problems with teeth and/or dentures?: No Does patient usually wear dentures?: No  CIWA:  CIWA-Ar Total: 3 COWS:     Musculoskeletal: Strength & Muscle Tone: within normal limits Gait & Station: normal Patient leans: N/A  Psychiatric Specialty Exam: Physical Exam  Nursing note and vitals reviewed. Constitutional: She is oriented to person, place, and time. She appears well-developed and well-nourished.  Cardiovascular: Normal rate.  Respiratory: Effort normal.  Neurological: She is alert and oriented to person, place, and time.    Review of Systems  Constitutional: Negative.   Respiratory: Negative.   Cardiovascular: Negative.   Gastrointestinal: Negative.   Psychiatric/Behavioral: Positive for depression (improving) and substance  abuse (BZDs). Negative for hallucinations, memory loss and suicidal ideas. The patient has insomnia. The patient is not nervous/anxious.     Blood pressure (!) 150/99, pulse (!) 102, temperature 98.2 F (36.8 C), temperature source Oral, resp. rate 20, height 5\' 5"  (1.651 m), weight 98 kg, SpO2 99 %.Body mass index is 35.94 kg/m.  See MD's discharge SRA     Have you used any form of tobacco in the last 30 days? (Cigarettes, Smokeless Tobacco, Cigars, and/or Pipes): No  Has this patient used any form of tobacco in the last 30 days? (Cigarettes, Smokeless Tobacco, Cigars, and/or Pipes)  No  Blood Alcohol level:  Lab Results  Component Value Date   [facility] <10 10/21/2018   ETH <11 59/56/3875    Metabolic Disorder Labs:  Lab Results  Component Value Date   HGBA1C 5.6 10/21/2018   MPG 114.02 10/21/2018   No results found for: PROLACTIN Lab Results  Component Value Date   CHOL 281 (H) 10/21/2018   TRIG 91 10/21/2018   HDL 46 10/21/2018   CHOLHDL 6.1 10/21/2018   VLDL 18 10/21/2018   LDLCALC 217 (H) 10/21/2018    See Psychiatric Specialty Exam and Suicide Risk Assessment completed by Attending Physician prior to discharge.  Discharge destination:  Home  Is patient on multiple antipsychotic therapies at discharge:  No   Has Patient had three or more failed trials of antipsychotic monotherapy by history:  No  Recommended Plan for Multiple Antipsychotic Therapies: NA  Discharge Instructions    Discharge instructions   Complete by:  As directed    Patient is instructed to take all prescribed medications as recommended. Report any side effects or adverse reactions to your outpatient psychiatrist. Patient is instructed to abstain from alcohol and illegal drugs while on prescription medications. In the event of worsening symptoms, patient is instructed to call the crisis hotline, 911, or go to the nearest emergency department for evaluation and treatment.     Allergies as of  10/22/2018      Reactions   Ketorolac Other (See Comments)   Anxiety and confusion   Morphine And Related Other (See Comments)   Mental status changes (confusion and anxiety)   Penicillins Itching   Has patient had a PCN reaction causing immediate rash, facial/tongue/throat swelling, SOB or lightheadedness with hypotension: Yes Has patient had a PCN reaction causing severe rash involving mucus membranes or skin necrosis: No Has patient had a PCN reaction that required hospitalization No Has patient had a PCN reaction occurring within the last 10 years: No If all of the above answers are "NO", then may proceed with Cephalosporin use.   Trazodone And Nefazodone    Restless and opposite effect.  Medication List    STOP taking these medications   ADVIL PM PO   alprazolam 2 MG tablet Commonly known as:  XANAX   lisinopril-hydrochlorothiazide 20-25 MG tablet Commonly known as:  PRINZIDE,ZESTORETIC   olmesartan 40 MG tablet Commonly known as:  Benicar   Oxcarbazepine 300 MG tablet Commonly known as:  TRILEPTAL   TYLENOL PM EXTRA STRENGTH PO   zolpidem 12.5 MG CR tablet Commonly known as:  AMBIEN CR     TAKE these medications     Indication  amLODipine 10 MG tablet Commonly known as:  NORVASC Take 1 tablet (10 mg total) by mouth daily. For high blood pressure  Indication:  High Blood Pressure Disorder   chlorproMAZINE 50 MG tablet Commonly known as:  THORAZINE Take 1 tablet (50 mg total) by mouth 2 (two) times daily. For mood/sleep  Indication:  Mood   fluticasone 50 MCG/ACT nasal spray Commonly known as:  FLONASE Place 1 spray into both nostrils daily. Start taking on:  October 23, 2018  Indication:  Allergic Rhinitis   furosemide 40 MG tablet Commonly known as:  LASIX TAKE 1 TABLET BY MOUTH EVERY DAY  Indication:  High Blood Pressure Disorder   hydrOXYzine 25 MG tablet Commonly known as:  ATARAX/VISTARIL Take 1 tablet (25 mg total) by mouth every 6 (six)  hours as needed for anxiety.  Indication:  Feeling Anxious   irbesartan 75 MG tablet Commonly known as:  AVAPRO Take 1 tablet (75 mg total) by mouth daily. For high blood pressure Start taking on:  October 23, 2018  Indication:  High Blood Pressure Disorder   loratadine 10 MG tablet Commonly known as:  CLARITIN Take 1 tablet (10 mg total) by mouth daily. Start taking on:  October 23, 2018  Indication:  Hayfever   LORazepam 1 MG tablet Commonly known as:  ATIVAN Take 1 tablet (1 mg total) by mouth every 6 (six) hours as needed (CIWA > 10).  Indication:  Xanax withdrawal   meloxicam 7.5 MG tablet Commonly known as:  MOBIC Take 1 tablet (7.5 mg total) by mouth 2 (two) times daily. For pain  Indication:  Pain   multivitamin with minerals Tabs tablet Take 1 tablet by mouth daily. Start taking on:  October 23, 2018  Indication:  Supplementation   potassium chloride SA 20 MEQ tablet Commonly known as:  K-DUR,KLOR-CON Take 1 tablet (20 mEq total) by mouth daily.  Indication:  Low Amount of Potassium in the Blood   tetrahydrozoline 0.05 % ophthalmic solution Place 1 drop into both eyes 3 (three) times daily as needed (Eye irritation).  Indication:  Irritation of the Eye      Follow-up Information    [facility]. Call.   Why:  Please call  the number listed to schedule an appointment for a sleep assessment.  Contact information: [address] [place] [phone]       PATIENT DECLINED [place] REFERRALS Follow up.   Why:  Patient reports that she does not want to be referred to any outpatient mental health providers at this time. Patient states she will follow up with an outpatient provider on her own.  Contact information: PATIENT DECLINED ANY OUTPATIENT MENTAL HEALTH REFERRALS          Follow-up recommendations: Activity as tolerated. Diet as recommended by primary care physician. Keep all scheduled  follow-up appointments as recommended.   Comments:   Patient is instructed to  take all prescribed medications as recommended. Report any side effects or adverse reactions to your outpatient psychiatrist. Patient is instructed to abstain from alcohol and illegal drugs while on prescription medications. In the event of worsening symptoms, patient is instructed to call the crisis hotline, 911, or go to the nearest emergency department for evaluation and treatment.  Signed: Connye Burkitt, NP 10/22/2018, 3:38 PM  Patient seen, Suicide Assessment Completed.  Disposition Plan Reviewed

## 2018-10-22 NOTE — [facility] Suicide Risk Assessment (Signed)
[facility] INPATIENT:  Family/Significant Other Suicide Prevention Education  Suicide Prevention Education:  Patient Refusal for Family/Significant Other Suicide Prevention Education: The patient Dorothy Alvarez has refused to provide written consent for family/significant other to be provided Family/Significant Other Suicide Prevention Education during admission and/or prior to discharge.  Physician notified.  SPE completed with patient, as patient refused to consent to family contact. SPI pamphlet provided to pt and pt was encouraged to share information with support network, ask questions, and talk about any concerns relating to SPE. Patient denies access to guns/firearms and verbalized understanding of information provided. Mobile Crisis information also provided to patient.    Marylee Floras 10/22/2018, 2:47 PM

## 2018-10-22 NOTE — Tx Team (Signed)
Interdisciplinary Treatment and Diagnostic Plan Update  10/22/2018 Time of Session:   Dorothy Alvarez MRN: [id number]  Principal Diagnosis: <principal problem not specified>  Secondary Diagnoses: Active Problems:   MDD (major depressive disorder), recurrent episode, severe (HCC)   Current Medications:  Current Facility-Administered Medications  Medication Dose Route Frequency Provider Last Rate Last Dose  . chlorproMAZINE (THORAZINE) tablet 50 mg  50 mg Oral TID PRN Cobos, Myer Peer, MD   50 mg at 10/21/18 2226  . fluticasone (FLONASE) 50 MCG/ACT nasal spray 1 spray  1 spray Each Nare Daily Cobos, Myer Peer, MD   1 spray at 10/21/18 1148  . furosemide (LASIX) tablet 40 mg  40 mg Oral Daily Cobos, Myer Peer, MD   40 mg at 10/22/18 0802  . hydrOXYzine (ATARAX/VISTARIL) tablet 25 mg  25 mg Oral Q6H PRN Cobos, Myer Peer, MD   25 mg at 10/21/18 2226  . irbesartan (AVAPRO) tablet 75 mg  75 mg Oral Daily Cobos, Myer Peer, MD   75 mg at 10/22/18 0802  . loperamide (IMODIUM) capsule 2-4 mg  2-4 mg Oral PRN Cobos, Myer Peer, MD      . loratadine (CLARITIN) tablet 10 mg  10 mg Oral Daily Cobos, Myer Peer, MD   10 mg at 10/22/18 0801  . LORazepam (ATIVAN) tablet 1 mg  1 mg Oral BID Cobos, Myer Peer, MD   1 mg at 10/22/18 0802  . LORazepam (ATIVAN) tablet 1 mg  1 mg Oral Q6H PRN Cobos, Myer Peer, MD   1 mg at 10/22/18 0004  . magnesium hydroxide (MILK OF MAGNESIA) suspension 30 mL  30 mL Oral Daily PRN Laverle Hobby, PA-C   30 mL at 10/20/18 2220  . meloxicam (MOBIC) tablet 7.5 mg  7.5 mg Oral BID Cobos, Myer Peer, MD   7.5 mg at 10/22/18 0802  . multivitamin with minerals tablet 1 tablet  1 tablet Oral Daily Cobos, Myer Peer, MD   1 tablet at 10/22/18 0803  . potassium chloride SA (K-DUR,KLOR-CON) CR tablet 20 mEq  20 mEq Oral BID Cobos, Myer Peer, MD   20 mEq at 10/22/18 0802  . tetrahydrozoline 0.05 % ophthalmic solution 1 drop  1 drop Both Eyes TID Hampton Abbot, MD   1 drop at 10/22/18  0801  . thiamine (VITAMIN B-1) tablet 100 mg  100 mg Oral Daily Cobos, Myer Peer, MD   100 mg at 10/22/18 0802  . traZODone (DESYREL) tablet 100 mg  100 mg Oral Once Laverle Hobby, PA-C       PTA Medications: Medications Prior to Admission  Medication Sig Dispense Refill Last Dose  . alprazolam (XANAX) 2 MG tablet Take 2 mg by mouth 3 (three) times daily as needed for anxiety.   1 04/04/2016 at 0600  . Diphenhydramine-APAP, sleep, (TYLENOL PM EXTRA STRENGTH PO) Take 2-4 tablets by mouth at bedtime as needed (for pain and sleep).   04/03/2016 at pm  . furosemide (LASIX) 40 MG tablet TAKE 1 TABLET BY MOUTH EVERY DAY 90 tablet 0 04/04/2016 at am  . Ibuprofen-Diphenhydramine Cit (ADVIL PM PO) Take 2-4 tablets by mouth at bedtime as needed (for sleep and pain).    Not Taking at Unknown time  . lisinopril-hydrochlorothiazide (PRINZIDE,ZESTORETIC) 20-25 MG tablet Take 1 tablet by mouth daily.   04/03/2016 at am  . olmesartan (BENICAR) 40 MG tablet Take 1 tablet (40 mg total) by mouth daily. (Patient not taking: Reported on 04/04/2016)   Not Taking at Unknown  time  . Oxcarbazepine (TRILEPTAL) 300 MG tablet Take 300 mg by mouth 2 (two) times daily.   04/03/2016 at pm  . potassium chloride SA (K-DUR,KLOR-CON) 20 MEQ tablet Take 1 tablet (20 mEq total) by mouth daily. 30 tablet 5 04/03/2016 at am  . zolpidem (AMBIEN CR) 12.5 MG CR tablet Take 12.5 mg by mouth at bedtime as needed for sleep.   1 04/03/2016 at pm    Patient Stressors: Medication change or noncompliance  Patient Strengths: Ability for insight Average or above average intelligence Capable of independent living General fund of knowledge Motivation for treatment/growth  Treatment Modalities: Medication Management, Group therapy, Case management,  1 to 1 session with clinician, Psychoeducation, Recreational therapy.   Physician Treatment Plan for Primary Diagnosis: <principal problem not specified> Long Term Goal(s): Improvement in symptoms  so as ready for discharge Improvement in symptoms so as ready for discharge   Short Term Goals: Ability to identify changes in lifestyle to reduce recurrence of condition will improve Ability to verbalize feelings will improve Ability to disclose and discuss suicidal ideas Ability to demonstrate self-control will improve Ability to identify and develop effective coping behaviors will improve Ability to maintain clinical measurements within normal limits will improve Ability to identify changes in lifestyle to reduce recurrence of condition will improve Ability to verbalize feelings will improve Ability to disclose and discuss suicidal ideas Ability to demonstrate self-control will improve Ability to identify and develop effective coping behaviors will improve Ability to maintain clinical measurements within normal limits will improve  Medication Management: Evaluate patient's response, side effects, and tolerance of medication regimen.  Therapeutic Interventions: 1 to 1 sessions, Unit Group sessions and Medication administration.  Evaluation of Outcomes: Adequate for Discharge  Physician Treatment Plan for Secondary Diagnosis: Active Problems:   MDD (major depressive disorder), recurrent episode, severe ([place])  Long Term Goal(s): Improvement in symptoms so as ready for discharge Improvement in symptoms so as ready for discharge   Short Term Goals: Ability to identify changes in lifestyle to reduce recurrence of condition will improve Ability to verbalize feelings will improve Ability to disclose and discuss suicidal ideas Ability to demonstrate self-control will improve Ability to identify and develop effective coping behaviors will improve Ability to maintain clinical measurements within normal limits will improve Ability to identify changes in lifestyle to reduce recurrence of condition will improve Ability to verbalize feelings will improve Ability to disclose and discuss  suicidal ideas Ability to demonstrate self-control will improve Ability to identify and develop effective coping behaviors will improve Ability to maintain clinical measurements within normal limits will improve     Medication Management: Evaluate patient's response, side effects, and tolerance of medication regimen.  Therapeutic Interventions: 1 to 1 sessions, Unit Group sessions and Medication administration.  Evaluation of Outcomes: Adequate for Discharge   RN Treatment Plan for Primary Diagnosis: <principal problem not specified> Long Term Goal(s): Knowledge of disease and therapeutic regimen to maintain health will improve  Short Term Goals: Ability to participate in decision making will improve, Ability to verbalize feelings will improve, Ability to disclose and discuss suicidal ideas, Ability to identify and develop effective coping behaviors will improve and Compliance with prescribed medications will improve  Medication Management: RN will administer medications as ordered by provider, will assess and evaluate patient's response and provide education to patient for prescribed medication. RN will report any adverse and/or side effects to prescribing provider.  Therapeutic Interventions: 1 on 1 counseling sessions, Psychoeducation, Medication administration, Evaluate responses to treatment,  Monitor vital signs and CBGs as ordered, Perform/monitor CIWA, COWS, AIMS and Fall Risk screenings as ordered, Perform wound care treatments as ordered.  Evaluation of Outcomes: Adequate for Discharge   LCSW Treatment Plan for Primary Diagnosis: <principal problem not specified> Long Term Goal(s): Safe transition to appropriate next level of care at discharge, Engage patient in therapeutic group addressing interpersonal concerns.  Short Term Goals: Engage patient in aftercare planning with referrals and resources  Therapeutic Interventions: Assess for all discharge needs, 1 to 1 time with  Social worker, Explore available resources and support systems, Assess for adequacy in community support network, Educate family and significant other(s) on suicide prevention, Complete Psychosocial Assessment, Interpersonal group therapy.  Evaluation of Outcomes: Adequate for Discharge   Progress in Treatment: Attending groups: Yes. Participating in groups: Yes. Taking medication as prescribed: Yes. Toleration medication: Yes. Family/Significant other contact made: No, will contact:  patient declined consent for collateral contacts Patient understands diagnosis: Yes. Discussing patient identified problems/goals with staff: Yes. Medical problems stabilized or resolved: Yes. Denies suicidal/homicidal ideation: Yes. Issues/concerns per patient self-inventory: No. Other:   New problem(s) identified: None   New Short Term/Long Term Goal(s):Detox, medication stabilization, elimination of SI thoughts, development of comprehensive mental wellness plan.    Patient Goals:    Discharge Plan or Barriers: Patient lives in [place] with her estranged husband and son. CSW will continue to follow and assess for appropriate referrals and possible discharge planning.   Reason for Continuation of Hospitalization: Anxiety Depression Medication stabilization Suicidal ideation Withdrawal symptoms  Estimated Length of Stay:Discharging, 10/22/2018  Attendees: Patient: 10/22/2018 8:41 AM  Physician: Dr. Neita Garnet, MD 10/22/2018 8:41 AM  Nursing: Sharl Ma.Viona Gilmore, RN 10/22/2018 8:41 AM  RN Care Manager: 10/22/2018 8:41 AM  Social Worker: Radonna Ricker, [place] 10/22/2018 8:41 AM  Recreational Therapist:  10/22/2018 8:41 AM  Other: Harriett Sine, NP 10/22/2018 8:41 AM  Other:  10/22/2018 8:41 AM  Other: 10/22/2018 8:41 AM    Scribe for Treatment Team: Marylee Floras, [place] 10/22/2018 8:41 AM

## 2018-10-22 NOTE — Progress Notes (Signed)
Pt told the other pt in the dayroom, "I will curse out anyone who comes in my room for lab work I did when I came in." " I do not want anyone in my room in the mornings.  RN notify

## 2019-03-26 ENCOUNTER — Emergency Department (HOSPITAL_COMMUNITY): Payer: Medicare HMO

## 2019-03-26 ENCOUNTER — Emergency Department (HOSPITAL_COMMUNITY)
Admission: EM | Admit: 2019-03-26 | Discharge: 2019-03-26 | Disposition: A | Discharge status: Home / Self Care | Payer: Medicare HMO | Attending: Emergency Medicine | Admitting: Emergency Medicine

## 2019-03-26 ENCOUNTER — Encounter (HOSPITAL_COMMUNITY): Payer: Self-pay

## 2019-03-26 ENCOUNTER — Other Ambulatory Visit: Payer: Self-pay

## 2019-03-26 DIAGNOSIS — I10 Essential (primary) hypertension: Secondary | ICD-10-CM

## 2019-03-26 DIAGNOSIS — Z888 Allergy status to other drugs, medicaments and biological substances status: Secondary | ICD-10-CM | POA: Insufficient documentation

## 2019-03-26 DIAGNOSIS — Z79899 Other long term (current) drug therapy: Secondary | ICD-10-CM | POA: Insufficient documentation

## 2019-03-26 DIAGNOSIS — G8929 Other chronic pain: Secondary | ICD-10-CM | POA: Diagnosis not present

## 2019-03-26 DIAGNOSIS — Z885 Allergy status to narcotic agent status: Secondary | ICD-10-CM | POA: Diagnosis not present

## 2019-03-26 DIAGNOSIS — Z880 Allergy status to penicillin: Secondary | ICD-10-CM | POA: Insufficient documentation

## 2019-03-26 DIAGNOSIS — Z9114 Patient's other noncompliance with medication regimen: Secondary | ICD-10-CM | POA: Insufficient documentation

## 2019-03-26 DIAGNOSIS — M545 Low back pain: Secondary | ICD-10-CM | POA: Insufficient documentation

## 2019-03-26 MED ORDER — HYDROMORPHONE HCL 1 MG/ML IJ SOLN
1.0000 mg | Freq: Once | INTRAMUSCULAR | Status: DC
  Filled 2019-03-26: qty 1

## 2019-03-26 MED ORDER — IRBESARTAN 75 MG PO TABS: 75.0000 mg | ORAL_TABLET | Freq: Every day | ORAL | 0 refills | Status: DC

## 2019-03-26 MED ORDER — CYCLOBENZAPRINE HCL 10 MG PO TABS: 10.0000 mg | ORAL_TABLET | Freq: Three times a day (TID) | ORAL | 0 refills | Status: DC | PRN

## 2019-03-26 MED ORDER — CYCLOBENZAPRINE HCL 10 MG PO TABS
5.0000 mg | ORAL_TABLET | Freq: Once | ORAL | Status: AC
  Administered 2019-03-26: 5 mg via ORAL
  Filled 2019-03-26: qty 1

## 2019-03-26 MED ORDER — AMLODIPINE BESYLATE 5 MG PO TABS
10.0000 mg | ORAL_TABLET | Freq: Once | ORAL | Status: AC
  Administered 2019-03-26: 10 mg via ORAL
  Filled 2019-03-26: qty 2

## 2019-03-26 MED ORDER — AMLODIPINE BESYLATE 10 MG PO TABS: 10.0000 mg | ORAL_TABLET | Freq: Every day | ORAL | 0 refills | Status: DC

## 2019-03-26 NOTE — ED Triage Notes (Signed)
Pt presents with c/o back pain. Pt has a hx of back problems and reports she had a "back injection" in 2016. Pt reports her pain is ongoing for years.

## 2019-03-26 NOTE — ED Provider Notes (Signed)
[facility] DEPT Provider Note   CSN: [id number] Arrival date & time: 03/26/19  1433    History   Chief Complaint Chief Complaint  Patient presents with  . Back Pain    HPI Dorothy Alvarez is a 53 y.o. female hx of HTN, IBS, here presenting with back pain.  Patient has chronic back pain and has been followed with pain management.  She received 240 mg of hydrocodone at the end of July.  She states that her back pain has been constant and is going on for years.  She denies any recent falls or injuries. She states that her injection was back in 2016 and did not have any recent manipulations or injections.  She states that the hydrocodone is not controlling her pain and requesting some muscle relaxants.  Denies any numbness or weakness to the legs.  She also has not been taking her blood pressure medicines and states that she does not tolerate them that well.      The history is provided by the patient.    Past Medical History:  Diagnosis Date  . Allergy   . Anxiety   . Arthritis   . Chronic abdominal pain   . Constipation   . Depression   . Hypertension   . IBS (irritable bowel syndrome)   . Learning disability   . Neuromuscular disorder [facility])     Patient Active Problem List   Diagnosis Date Noted  . Benzodiazepine dependence ([place]) 10/22/2018  . Bipolar II disorder ([place])   . MDD (major depressive disorder), recurrent episode, severe ([place]) 10/20/2018  . Learning disability 06/25/2015  . Illiterate 06/25/2015  . Adjustment disorder with depressed mood 06/24/2014  . Stress reaction causing mixed disturbance of emotion and conduct   . Opiate dependence, continuous ([place]) 06/16/2014  . 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 GAD (generalized anxiety disorder) 06/16/2014  . Major depressive disorder, recurrent, severe without psychotic features ([place]) 06/16/2014  . Major depressive disorder, recurrent episode, moderate ([place])  04/13/2014  . Insomnia 04/13/2014  . DYSPNEA 07/05/2009  . VAGINITIS 07/04/2009  . OVARIAN CYST 07/04/2009  . Essential hypertension 07/03/2009  . CARDIOMEGALY 07/03/2009  . PLEURAL EFFUSION 07/03/2009  . DIVERTICULITIS, COLON 07/03/2009  . PERSONAL HX COLONIC POLYPS 07/03/2009    Past Surgical History:  Procedure Laterality Date  . ABDOMINAL HYSTERECTOMY    . GALLBLADDER SURGERY    . KNEE SURGERY       OB History   No obstetric history on file.      Home Medications    Prior to Admission medications   Medication Sig Start Date End Date Taking? Authorizing Provider  amLODipine (NORVASC) 10 MG tablet Take 1 tablet (10 mg total) by mouth daily. For high blood pressure 10/22/18   Connye Burkitt, NP  chlorproMAZINE (THORAZINE) 50 MG tablet Take 1 tablet (50 mg total) by mouth 2 (two) times daily. For mood/sleep 10/22/18   Connye Burkitt, NP  fluticasone (FLONASE) 50 MCG/ACT nasal spray Place 1 spray into both nostrils daily. 10/23/18   Connye Burkitt, NP  furosemide (LASIX) 40 MG tablet TAKE 1 TABLET BY MOUTH EVERY DAY 10/05/15   Ivar Drape D, PA  hydrOXYzine (ATARAX/VISTARIL) 25 MG tablet Take 1 tablet (25 mg total) by mouth every 6 (six) hours as needed for anxiety. 10/22/18   Connye Burkitt, NP  irbesartan (AVAPRO) 75 MG tablet Take 1 tablet (75 mg total) by mouth daily. For high blood pressure  10/23/18   Connye Burkitt, NP  loratadine (CLARITIN) 10 MG tablet Take 1 tablet (10 mg total) by mouth daily. 10/23/18   Connye Burkitt, NP  LORazepam (ATIVAN) 1 MG tablet Take 1 tablet (1 mg total) by mouth every 6 (six) hours as needed (CIWA > 10). 10/22/18   Connye Burkitt, NP  meloxicam (MOBIC) 7.5 MG tablet Take 1 tablet (7.5 mg total) by mouth 2 (two) times daily. For pain 10/22/18   Connye Burkitt, NP  Multiple Vitamin (MULTIVITAMIN WITH MINERALS) TABS tablet Take 1 tablet by mouth daily. 10/23/18   Connye Burkitt, NP  potassium chloride SA (K-DUR,KLOR-CON) 20 MEQ tablet Take 1 tablet (20 mEq  total) by mouth daily. 07/05/15   Copland, Gay Filler, MD  tetrahydrozoline 0.05 % ophthalmic solution Place 1 drop into both eyes 3 (three) times daily as needed (Eye irritation). 10/22/18   Connye Burkitt, NP    Family History Family History  Problem Relation Age of Onset  . Diabetes Mother   . Suicidality Mother   . Heart disease Father   . Depression Father     Social History Social History   Tobacco Use  . Smoking status: Never Smoker  . Smokeless tobacco: Never Used  Substance Use Topics  . Alcohol use: No  . Drug use: Yes    Types: Other-see comments, Marijuana    Comment: opiates     Allergies   Ketorolac, Morphine and related, Penicillins, and Trazodone and nefazodone   Review of Systems Review of Systems  Musculoskeletal: Positive for back pain.  All other systems reviewed and are negative.    Physical Exam Updated Vital Signs BP (!) 190/126 (BP Location: Left Arm)   Pulse 92   Temp 99.1 F (37.3 C) (Oral)   Resp 18   Ht 5\' 4"  (1.626 m)   Wt 104.3 kg   SpO2 100%   BMI 39.48 kg/m   Physical Exam Vitals signs and nursing note reviewed.  Constitutional:      Comments: Uncomfortable   HENT:     Head: Normocephalic.     Nose: Nose normal.     Mouth/Throat:     Mouth: Mucous membranes are moist.  Eyes:     Extraocular Movements: Extraocular movements intact.     Pupils: Pupils are equal, round, and reactive to light.  Neck:     Musculoskeletal: Normal range of motion.  Cardiovascular:     Rate and Rhythm: Normal rate and regular rhythm.     Pulses: Normal pulses.     Heart sounds: Normal heart sounds.  Pulmonary:     Effort: Pulmonary effort is normal.     Breath sounds: Normal breath sounds.  Abdominal:     General: Abdomen is flat.     Palpations: Abdomen is soft.  Musculoskeletal:     Comments: + diffuse lower lumbar tenderness, + paralumbar spasms, no stepoff or deformity   Skin:    General: Skin is warm.     Capillary Refill:  Capillary refill takes less than 2 seconds.  Neurological:     Mental Status: She is alert.     Comments: CN 2- 12 intact. Nl strength and sensation bilateral lower extremities. In particular, nl hip flexion and extension, nl knee flexion and extension bilaterally. Nl reflexes bilaterally, no saddle anesthesia   Psychiatric:        Mood and Affect: Mood normal.        Behavior: Behavior normal.  ED Treatments / Results  Labs (all labs ordered are listed, but only abnormal results are displayed) Labs Reviewed - No data to display  EKG None  Radiology Dg Lumbar Spine Complete  Result Date: 03/26/2019 CLINICAL DATA:  Back pain EXAM: LUMBAR SPINE - COMPLETE 4+ VIEW COMPARISON:  CT and pelvis dated October 11, 2012. FINDINGS: There are mild multilevel degenerative changes throughout the visualized lumbar spine. There is mild facet arthrosis at the lower lumbar segments. There is some straightening of the normal lumbar lordotic curvature. Phleboliths project over the patient's partially visualized pelvis. IMPRESSION: No acute osseous abnormality. Electronically Signed   By: Constance Holster M.D.   On: 03/26/2019 16:48    Procedures Procedures (including critical care time)  Medications Ordered in ED Medications  amLODipine (NORVASC) tablet 10 mg (10 mg Oral Given 03/26/19 1616)  cyclobenzaprine (FLEXERIL) tablet 5 mg (5 mg Oral Given 03/26/19 1616)     Initial Impression / Assessment and Plan / ED Course  I have reviewed the triage vital signs and the nursing notes.  Pertinent labs & imaging results that were available during my care of the patient were reviewed by me and considered in my medical decision making (see chart for details).       Dorothy Alvarez is a 53 y.o. female hx of chronic back pain with worsening back pain. Patient neurovascular intact. No saddle anesthesia. She has no fall or injury. She is hypertensive but isn't compliant with BP meds and denies chest pain or  SOB.  I discussed treatment options including injections and prednisone and muscle relaxants.  Patient states that she will continue her pain meds and does not want any prednisone as it will cause her to gain weight.  I told her that prescribing muscle relaxants may get her kicked out of her pain management program but she states that her pain management doctor is only prescribing pain meds for her and she would like to try muscle relaxants.  We will give her some blood pressure medicines as well and will get lumbar x-ray.  4:51 PM Lumbar xrays unremarkable. Will prescribe flexeril and also her BP meds. She will call her pain management doctor on Mon as well.    Final Clinical Impressions(s) / ED Diagnoses   Final diagnoses:  None    ED Discharge Orders    None       Drenda Freeze, MD 03/26/19 [phone]

## 2019-03-26 NOTE — Discharge Instructions (Signed)
Continue your pain meds as prescribed by your pain management doctor   Take flexeril in addition. You need to tell you pain doctor about this   Your blood pressure is elevated so you were prescribed your blood pressure medicines   See your doctor in a week   Return to ER if you have worse back pain, weakness, numbness, fever, trouble walking

## 2019-07-01 ENCOUNTER — Emergency Department (HOSPITAL_COMMUNITY)
Admission: EM | Admit: 2019-07-01 | Discharge: 2019-07-03 | Disposition: A | Discharge status: Home / Self Care | Payer: Medicare HMO | Attending: Emergency Medicine | Admitting: Emergency Medicine

## 2019-07-01 ENCOUNTER — Encounter (HOSPITAL_COMMUNITY): Payer: Self-pay | Admitting: Emergency Medicine

## 2019-07-01 ENCOUNTER — Other Ambulatory Visit: Payer: Self-pay

## 2019-07-01 DIAGNOSIS — I10 Essential (primary) hypertension: Secondary | ICD-10-CM | POA: Insufficient documentation

## 2019-07-01 DIAGNOSIS — F332 Major depressive disorder, recurrent severe without psychotic features: Secondary | ICD-10-CM | POA: Diagnosis not present

## 2019-07-01 DIAGNOSIS — F3181 Bipolar II disorder: Secondary | ICD-10-CM | POA: Diagnosis present

## 2019-07-01 DIAGNOSIS — F1210 Cannabis abuse, uncomplicated: Secondary | ICD-10-CM | POA: Diagnosis not present

## 2019-07-01 DIAGNOSIS — Z79899 Other long term (current) drug therapy: Secondary | ICD-10-CM | POA: Insufficient documentation

## 2019-07-01 DIAGNOSIS — Z20828 Contact with and (suspected) exposure to other viral communicable diseases: Secondary | ICD-10-CM | POA: Insufficient documentation

## 2019-07-01 DIAGNOSIS — Z008 Encounter for other general examination: Secondary | ICD-10-CM | POA: Insufficient documentation

## 2019-07-01 DIAGNOSIS — R45851 Suicidal ideations: Secondary | ICD-10-CM | POA: Diagnosis not present

## 2019-07-01 DIAGNOSIS — Z046 Encounter for general psychiatric examination, requested by authority: Secondary | ICD-10-CM | POA: Diagnosis present

## 2019-07-01 LAB — CBC WITH DIFFERENTIAL/PLATELET
Abs Immature Granulocytes: 0.02 10*3/uL (ref 0.00–0.07)
Basophils Absolute: 0.1 10*3/uL (ref 0.0–0.1)
Basophils Relative: 1 %
Eosinophils Absolute: 0.3 10*3/uL (ref 0.0–0.5)
Eosinophils Relative: 4 %
HCT: 44.8 % (ref 36.0–46.0)
Hemoglobin: 14.8 g/dL (ref 12.0–15.0)
Immature Granulocytes: 0 %
Lymphocytes Relative: 40 %
Lymphs Abs: 3.4 10*3/uL (ref 0.7–4.0)
MCH: 31.3 pg (ref 26.0–34.0)
MCHC: 33 g/dL (ref 30.0–36.0)
MCV: 94.7 fL (ref 80.0–100.0)
Monocytes Absolute: 0.4 10*3/uL (ref 0.1–1.0)
Monocytes Relative: 5 %
Neutro Abs: 4.2 10*3/uL (ref 1.7–7.7)
Neutrophils Relative %: 50 %
Platelets: 344 10*3/uL (ref 150–400)
RBC: 4.73 MIL/uL (ref 3.87–5.11)
RDW: 12.9 % (ref 11.5–15.5)
WBC: 8.4 10*3/uL (ref 4.0–10.5)
nRBC: 0 % (ref 0.0–0.2)

## 2019-07-01 LAB — ACETAMINOPHEN LEVEL: Acetaminophen (Tylenol), Serum: <10 ug/mL — ABNORMAL LOW (ref 10–30)

## 2019-07-01 LAB — RAPID URINE DRUG SCREEN, HOSP PERFORMED
Amphetamines: NOT DETECTED
Barbiturates: NOT DETECTED
Benzodiazepines: NOT DETECTED
Cocaine: NOT DETECTED
Opiates: POSITIVE — AB
Tetrahydrocannabinol: NOT DETECTED

## 2019-07-01 LAB — BASIC METABOLIC PANEL
Anion gap: 10 (ref 5–15)
BUN: 17 mg/dL (ref 6–20)
CO2: 26 mmol/L (ref 22–32)
Calcium: 10 mg/dL (ref 8.9–10.3)
Chloride: 106 mmol/L (ref 98–111)
Creatinine, Ser: 1.12 mg/dL — ABNORMAL HIGH (ref 0.44–1.00)
GFR calc Af Amer: >60 mL/min (ref >60)
GFR calc non Af Amer: 56 mL/min — ABNORMAL LOW (ref >60)
Glucose, Bld: 99 mg/dL (ref 70–99)
Potassium: 3.3 mmol/L — ABNORMAL LOW (ref 3.5–5.1)
Sodium: 142 mmol/L (ref 135–145)

## 2019-07-01 LAB — ETHANOL: Alcohol, Ethyl (B): <10 mg/dL (ref <10)

## 2019-07-01 LAB — SALICYLATE LEVEL: Salicylate Lvl: <7 mg/dL (ref 2.8–30.0)

## 2019-07-01 MED ORDER — HYDROXYZINE HCL 25 MG PO TABS
25.0000 mg | ORAL_TABLET | Freq: Once | ORAL | Status: AC | PRN
  Administered 2019-07-02: 05:00:00 25 mg via ORAL
  Filled 2019-07-01: qty 1

## 2019-07-01 MED ORDER — HYDROCHLOROTHIAZIDE 12.5 MG PO CAPS
12.5000 mg | ORAL_CAPSULE | Freq: Once | ORAL | Status: AC
  Administered 2019-07-01: 23:00:00 12.5 mg via ORAL
  Filled 2019-07-01: qty 1

## 2019-07-01 MED ORDER — HYDROXYZINE HCL 25 MG PO TABS
25.0000 mg | ORAL_TABLET | Freq: Once | ORAL | Status: AC
  Administered 2019-07-01: 22:00:00 25 mg via ORAL
  Filled 2019-07-01: qty 1

## 2019-07-01 MED ORDER — AMLODIPINE BESYLATE 10 MG PO TABS: 10.0000 mg | ORAL_TABLET | Freq: Every day | ORAL | 0 refills | Status: DC

## 2019-07-01 MED ORDER — AMLODIPINE BESYLATE 5 MG PO TABS
10.0000 mg | ORAL_TABLET | Freq: Once | ORAL | Status: AC
  Administered 2019-07-01: 18:00:00 10 mg via ORAL
  Filled 2019-07-01: qty 2

## 2019-07-01 MED ORDER — HYDROCHLOROTHIAZIDE 12.5 MG PO TABS: 12.5000 mg | ORAL_TABLET | Freq: Every day | ORAL | 0 refills | Status: DC

## 2019-07-01 MED ORDER — LABETALOL HCL 5 MG/ML IV SOLN
10.0000 mg | Freq: Once | INTRAVENOUS | Status: AC
  Administered 2019-07-01: 10 mg via INTRAVENOUS
  Filled 2019-07-01: qty 4

## 2019-07-01 MED ORDER — LORAZEPAM 1 MG PO TABS
1.0000 mg | ORAL_TABLET | Freq: Once | ORAL | Status: AC
  Administered 2019-07-01: 18:00:00 1 mg via ORAL
  Filled 2019-07-01: qty 1

## 2019-07-01 NOTE — ED Triage Notes (Signed)
Per EMS-coming from [facility]-BP 185/127-sending to ED for "BP management"-will return to [facility] once cleared

## 2019-07-01 NOTE — ED Notes (Signed)
Patient given crackers

## 2019-07-01 NOTE — Discharge Instructions (Signed)
Please follow up with your primary care provider within 5-7 days for re-evaluation of your symptoms. If you do not have a primary care provider, information for a healthcare clinic has been provided for you to make arrangements for follow up care. Please return to the emergency department for any new or worsening symptoms. ° °

## 2019-07-01 NOTE — ED Provider Notes (Signed)
[facility] DEPT Provider Note   CSN: [id number] Arrival date & time: 07/01/19  1540     History   Chief Complaint Chief Complaint  Patient presents with  . Medical Clearance    HPI Dorothy Alvarez is a 53 y.o. female.     HPI   Patient is a 53 year old female with a history of anxiety, depression, hypertension, IBS, who presents to the emergency department today for evaluation of hypertension.  Per patient she was seen at [facility] for difficulty sleeping and SI prior to arrival but was sent here for medical clearance after blood pressure noted to be 185/127.  They are recommending medical clearance prior to being treated for her psychiatric illnesses.  Patient denies any headaches, visual changes, numbness/weakness, chest pain, shortness of breath or other complaints at this time.  She is asymptomatic.  She reports a history of high blood pressure but is not currently on medication.  Past Medical History:  Diagnosis Date  . Allergy   . Anxiety   . Arthritis   . Chronic abdominal pain   . Constipation   . Depression   . Hypertension   . IBS (irritable bowel syndrome)   . Learning disability   . Neuromuscular disorder [facility])     Patient Active Problem List   Diagnosis Date Noted  . Benzodiazepine dependence ([place]) 10/22/2018  . Bipolar II disorder ([place])   . MDD (major depressive disorder), recurrent episode, severe ([place]) 10/20/2018  . Learning disability 06/25/2015  . Illiterate 06/25/2015  . Adjustment disorder with depressed mood 06/24/2014  . Stress reaction causing mixed disturbance of emotion and conduct   . Opiate dependence, continuous ([place]) 06/16/2014  . 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 GAD (generalized anxiety disorder) 06/16/2014  . Major depressive disorder, recurrent, severe without psychotic features ([place]) 06/16/2014  . Major depressive disorder, recurrent episode, moderate ([place]) 04/13/2014  .  Insomnia 04/13/2014  . DYSPNEA 07/05/2009  . VAGINITIS 07/04/2009  . OVARIAN CYST 07/04/2009  . Essential hypertension 07/03/2009  . CARDIOMEGALY 07/03/2009  . PLEURAL EFFUSION 07/03/2009  . DIVERTICULITIS, COLON 07/03/2009  . PERSONAL HX COLONIC POLYPS 07/03/2009    Past Surgical History:  Procedure Laterality Date  . ABDOMINAL HYSTERECTOMY    . GALLBLADDER SURGERY    . KNEE SURGERY       OB History   No obstetric history on file.      Home Medications    Prior to Admission medications   Medication Sig Start Date End Date Taking? Authorizing Provider  amLODipine (NORVASC) 10 MG tablet Take 1 tablet (10 mg total) by mouth daily. 07/01/19   Worthy Boschert S, PA-C  chlorproMAZINE (THORAZINE) 50 MG tablet Take 1 tablet (50 mg total) by mouth 2 (two) times daily. For mood/sleep 10/22/18   Connye Burkitt, NP  cyclobenzaprine (FLEXERIL) 10 MG tablet Take 1 tablet (10 mg total) by mouth 3 (three) times daily as needed for muscle spasms. 03/26/19   Drenda Freeze, MD  fluticasone (FLONASE) 50 MCG/ACT nasal spray Place 1 spray into both nostrils daily. 10/23/18   Connye Burkitt, NP  furosemide (LASIX) 40 MG tablet TAKE 1 TABLET BY MOUTH EVERY DAY 10/05/15   Ivar Drape D, PA  hydrochlorothiazide (HYDRODIURIL) 12.5 MG tablet Take 1 tablet (12.5 mg total) by mouth daily. 07/01/19 07/31/19  Obie Silos S, PA-C  hydrOXYzine (ATARAX/VISTARIL) 25 MG tablet Take 1 tablet (25 mg total) by mouth every 6 (six) hours as needed for anxiety.  10/22/18   Connye Burkitt, NP  irbesartan (AVAPRO) 75 MG tablet Take 1 tablet (75 mg total) by mouth daily. For high blood pressure 03/26/19   Drenda Freeze, MD  loratadine (CLARITIN) 10 MG tablet Take 1 tablet (10 mg total) by mouth daily. 10/23/18   Connye Burkitt, NP  LORazepam (ATIVAN) 1 MG tablet Take 1 tablet (1 mg total) by mouth every 6 (six) hours as needed (CIWA > 10). 10/22/18   Connye Burkitt, NP  meloxicam (MOBIC) 7.5 MG tablet Take 1 tablet  (7.5 mg total) by mouth 2 (two) times daily. For pain 10/22/18   Connye Burkitt, NP  Multiple Vitamin (MULTIVITAMIN WITH MINERALS) TABS tablet Take 1 tablet by mouth daily. 10/23/18   Connye Burkitt, NP  potassium chloride SA (K-DUR,KLOR-CON) 20 MEQ tablet Take 1 tablet (20 mEq total) by mouth daily. 07/05/15   Copland, Gay Filler, MD  tetrahydrozoline 0.05 % ophthalmic solution Place 1 drop into both eyes 3 (three) times daily as needed (Eye irritation). 10/22/18   Connye Burkitt, NP    Family History Family History  Problem Relation Age of Onset  . Diabetes Mother   . Suicidality Mother   . Heart disease Father   . Depression Father     Social History Social History   Tobacco Use  . Smoking status: Never Smoker  . Smokeless tobacco: Never Used  Substance Use Topics  . Alcohol use: No  . Drug use: Yes    Types: Other-see comments, Marijuana    Comment: opiates     Allergies   Ketorolac, Morphine and related, Penicillins, and Trazodone and nefazodone   Review of Systems Review of Systems  Constitutional: Negative for chills and fever.  HENT: Negative for ear pain and sore throat.   Eyes: Negative for visual disturbance.  Respiratory: Negative for cough and shortness of breath.   Cardiovascular: Negative for chest pain.  Gastrointestinal: Negative for abdominal pain, constipation, diarrhea, nausea and vomiting.  Genitourinary: Negative for dysuria and hematuria.  Musculoskeletal: Negative for back pain.  Skin: Negative for color change and rash.  Neurological: Negative for dizziness, weakness, light-headedness, numbness and headaches.  Psychiatric/Behavioral: Positive for suicidal ideas. Negative for hallucinations.  All other systems reviewed and are negative.    Physical Exam Updated Vital Signs BP (!) 189/118 (BP Location: Left Arm)   Pulse 97   Temp 98.7 F (37.1 C) (Oral)   Resp 19   SpO2 100%   Physical Exam Vitals signs and nursing note reviewed.   Constitutional:      General: She is not in acute distress.    Appearance: She is well-developed.  HENT:     Head: Normocephalic and atraumatic.  Eyes:     Conjunctiva/sclera: Conjunctivae normal.  Neck:     Musculoskeletal: Neck supple.  Cardiovascular:     Rate and Rhythm: Normal rate and regular rhythm.     Pulses: Normal pulses.     Heart sounds: Normal heart sounds. No murmur.  Pulmonary:     Effort: Pulmonary effort is normal. No respiratory distress.     Breath sounds: Normal breath sounds. No wheezing, rhonchi or rales.  Abdominal:     General: Bowel sounds are normal.     Palpations: Abdomen is soft.     Tenderness: There is no abdominal tenderness. There is no guarding or rebound.  Skin:    General: Skin is warm and dry.  Neurological:     Mental Status: She  is alert.     Comments: Mental Status:  Alert, thought content appropriate, able to give a coherent history. Speech fluent without evidence of aphasia. Able to follow 2 step commands without difficulty.  Cranial Nerves:  II:  Pupils equal, round, reactive to light III,IV, VI: ptosis not present, extra-ocular motions intact bilaterally  V,VII: smile symmetric, facial light touch sensation equal VIII: hearing grossly normal to voice  X: uvula elevates symmetrically  XI: bilateral shoulder shrug symmetric and strong XII: midline tongue extension without fassiculations Motor:  Normal tone. 5/5 strength of BUE and BLE major muscle groups including strong and equal grip strength and dorsiflexion/plantar flexion Sensory: light touch normal in all extremities.     ED Treatments / Results  Labs (all labs ordered are listed, but only abnormal results are displayed) Labs Reviewed  BASIC METABOLIC PANEL - Abnormal; Notable for the following components:      Result Value   Potassium 3.3 (*)    Creatinine, Ser 1.12 (*)    GFR calc non Af Amer 56 (*)    All other components within normal limits  ACETAMINOPHEN LEVEL -  Abnormal; Notable for the following components:   Acetaminophen (Tylenol), Serum <10 (*)    All other components within normal limits  SARS CORONAVIRUS 2 (TAT 6-24 HRS)  CBC WITH DIFFERENTIAL/PLATELET  SALICYLATE LEVEL  ETHANOL  RAPID URINE DRUG SCREEN, HOSP PERFORMED    EKG EKG Interpretation  Date/Time:  Friday July 01 2019 15:56:42 EST Ventricular Rate:  94 PR Interval:    QRS Duration: 81 QT Interval:  339 QTC Calculation: 424 R Axis:   -47 Text Interpretation: Sinus rhythm Probable left atrial enlargement Left axis deviation Borderline T wave abnormalities When compared to prior, previous t wave inversions now upright in leads 2,3,AVF,V3-V6. No STEMI Confirmed by Antony Blackbird [phone]) on 07/01/2019 5:44:06 PM   Radiology No results found.  Procedures Procedures (including critical care time)  Medications Ordered in ED Medications  hydrOXYzine (ATARAX/VISTARIL) tablet 25 mg (has no administration in time range)  hydrOXYzine (ATARAX/VISTARIL) tablet 25 mg (has no administration in time range)  LORazepam (ATIVAN) tablet 1 mg (1 mg Oral Given 07/01/19 1816)  amLODipine (NORVASC) tablet 10 mg (10 mg Oral Given 07/01/19 1816)  labetalol (NORMODYNE) injection 10 mg (10 mg Intravenous Given 07/01/19 2030)     Initial Impression / Assessment and Plan / ED Course  I have reviewed the triage vital signs and the nursing notes.  Pertinent labs & imaging results that were available during my care of the patient were reviewed by me and considered in my medical decision making (see chart for details).    Final Clinical Impressions(s) / ED Diagnoses   Final diagnoses:  Medical clearance for psychiatric admission  Hypertension, unspecified type   53 y/o female who presents to the ED for eval of HTN, seen at [facility] PTA and sent here for BP stabilization and medical clearance. Pt with hx of HTN, untreated. Asx today.   CBC wnl BMP with mild hypokalemia, mildly elevated  creatinine but similar to prior Tylenol, salicylate, etoh negative uds pending COVID pending  EKG Sinus rhythm Probable left atrial enlargement Left axis deviation Borderline T wave abnormalities When compared to prior, previous t wave inversions now upright in leads 2,3,AVF,V3-V6. No STEMI  Discussed with [facility]. They are requesting medical clearance and BP med recommendations for pt prior to sending her back to [facility] for tx. [facility] recommending BP to be less than 160/110. Nurse stated that on  call doctor will not take pt to [facility] tonight due to concern for recurrence of hypertension while at [facility]. He stated that if BP remains stable overnight then we can call in AM to send pt back over to [facility].   Pt given amlodipine and labetolol in the ED. AT shift change, care transitioned to [facility], PA-C with plan to recheck BP after labetolol. If pt has persistent HTN, would add HCTZ. Plan to DC with amlodipine and HCTZ. Will need to call [facility] in AM to send pt back over.     ED Discharge Orders         Ordered    amLODipine (NORVASC) 10 MG tablet  Daily     07/01/19 2030    hydrochlorothiazide (HYDRODIURIL) 12.5 MG tablet  Daily     07/01/19 2030           Rodney Booze, PA-C 07/01/19 2112    Tegeler, Gwenyth Allegra, MD 07/02/19 [phone]

## 2019-07-02 DIAGNOSIS — Z008 Encounter for other general examination: Secondary | ICD-10-CM | POA: Diagnosis not present

## 2019-07-02 LAB — SARS CORONAVIRUS 2 (TAT 6-24 HRS): SARS Coronavirus 2: NEGATIVE

## 2019-07-02 MED ORDER — HYDROCHLOROTHIAZIDE 12.5 MG PO CAPS
12.5000 mg | ORAL_CAPSULE | Freq: Once | ORAL | Status: AC
  Administered 2019-07-02: 07:00:00 12.5 mg via ORAL
  Filled 2019-07-02: qty 1

## 2019-07-02 MED ORDER — DIPHENHYDRAMINE HCL 25 MG PO CAPS
25.0000 mg | ORAL_CAPSULE | Freq: Once | ORAL | Status: AC
  Administered 2019-07-02: 01:00:00 25 mg via ORAL
  Filled 2019-07-02: qty 1

## 2019-07-02 MED ORDER — CLONIDINE HCL 0.1 MG PO TABS
0.2000 mg | ORAL_TABLET | Freq: Once | ORAL | Status: AC
  Administered 2019-07-02: 0.2 mg via ORAL
  Filled 2019-07-02: qty 2

## 2019-07-02 MED ORDER — IRBESARTAN 75 MG PO TABS
75.0000 mg | ORAL_TABLET | Freq: Every day | ORAL | Status: DC
  Administered 2019-07-02 – 2019-07-03 (×2): 75 mg via ORAL
  Filled 2019-07-02 (×2): qty 1

## 2019-07-02 MED ORDER — HYDROCHLOROTHIAZIDE 12.5 MG PO CAPS
12.5000 mg | ORAL_CAPSULE | Freq: Once | ORAL | Status: AC
  Administered 2019-07-02: 12.5 mg via ORAL
  Filled 2019-07-02: qty 1

## 2019-07-02 MED ORDER — LORAZEPAM 1 MG PO TABS
1.0000 mg | ORAL_TABLET | Freq: Once | ORAL | Status: AC
  Administered 2019-07-03: 1 mg via ORAL
  Filled 2019-07-02: qty 1

## 2019-07-02 MED ORDER — AMLODIPINE BESYLATE 5 MG PO TABS
10.0000 mg | ORAL_TABLET | Freq: Once | ORAL | Status: AC
  Administered 2019-07-02: 05:00:00 10 mg via ORAL
  Filled 2019-07-02: qty 2

## 2019-07-02 MED ORDER — POTASSIUM CHLORIDE CRYS ER 20 MEQ PO TBCR
20.0000 meq | EXTENDED_RELEASE_TABLET | Freq: Once | ORAL | Status: AC
  Administered 2019-07-02: 01:00:00 20 meq via ORAL
  Filled 2019-07-02: qty 1

## 2019-07-02 NOTE — ED Notes (Addendum)
Dorothy Alvarez, [name] made aware of patients BP.   Patient reports she has not slept in 5 days and is irritable.

## 2019-07-02 NOTE — BH Assessment (Addendum)
Assessment Note  Dorothy Alvarez is an 53 y.o. female that presents this date voluntary with S/I from [facility] requesting medical clearance. Patient denies any plan although voices intent. Patient denies any H/I or AVH. Patient per notes was last seen on 10/20/18 when she presented with similar symptoms. Patient denies any previous attempts or gestures at self harm. Patient reports she was diagnosed with depression over ten years ago and has been "off and on" medications since then for symptom management. Patient states she has been receiving services from [facility] that assists with medication management. Patient cannot recall the last time she met with that provider and states she has been out of her medications for over three days. Patient reports she was on Paxil and Seroquel although states she did not feel they were "helping her enough." Patient reports ongoing symptoms to include: feeling helpless (for no reason) and isolating. Patient reports decreased sleep over the last week stating she has not slept more than two or three hours a night since then. Patient cannot identify any immediate stressors stating "it's just everything." Patient is observed to be tearful and reports she is "always like this around the holidays."Patient reports a long standing history of chronic depression, which waxes and wanes. She does endorses episodes of increased irritability, racing thoughts, inability to sleep and isolating. Patient denies any SA history. Patient states she resides alone with her teenage son. Per notes patient is here from [place] where she presented earlier today with S/I. Patient was sent to [facility] for evaluation of hypertension. Patient is supposed to return to [facility] upon discharge. Patient is alert and oriented x4. Patient maintained good eye contact throughout assessment. Patient speech is normal and her thought process is coherent and relevant. Patient mood is depressed and patient is observed to be tearful at  times. Her affect is congruent with her mood. Case was staffed with [place] who reported patient met inpatient criteria and recommended a inpatient admission and be returned to [facility] once her medical issues are resolved.     Diagnosis: F33.2 MDD recurrent without psychotic features, severe  Past Medical History:  Past Medical History:  Diagnosis Date  . Allergy   . Anxiety   . Arthritis   . Chronic abdominal pain   . Constipation   . Depression   . Hypertension   . IBS (irritable bowel syndrome)   . Learning disability   . Neuromuscular disorder [facility])     Past Surgical History:  Procedure Laterality Date  . ABDOMINAL HYSTERECTOMY    . GALLBLADDER SURGERY    . KNEE SURGERY      Family History:  Family History  Problem Relation Age of Onset  . Diabetes Mother   . Suicidality Mother   . Heart disease Father   . Depression Father     Social History:  reports that she has never smoked. She has never used smokeless tobacco. She reports current drug use. Drugs: Other-see comments and Marijuana. She reports that she does not drink alcohol.  Additional Social History:  Alcohol / Drug Use Pain Medications: See MAR Prescriptions: See MAR Over the Counter: See MAR History of alcohol / drug use?: No history of alcohol / drug abuse  CIWA: CIWA-Ar BP: (!) 188/116 Pulse Rate: 98 COWS:    Allergies:  Allergies  Allergen Reactions  . Ketorolac Other (See Comments)    Anxiety and confusion  . Morphine And Related Other (See Comments)    Mental status changes (confusion and anxiety)  .  Penicillins Itching    Has patient had a PCN reaction causing immediate rash, facial/tongue/throat swelling, SOB or lightheadedness with hypotension: Yes Has patient had a PCN reaction causing severe rash involving mucus membranes or skin necrosis: No Has patient had a PCN reaction that required hospitalization No Has patient had a PCN reaction occurring within the last 10 years: No If all  of the above answers are "NO", then may proceed with Cephalosporin use.   . Trazodone And Nefazodone     Restless and opposite effect.     Home Medications: (Not in a hospital admission)   OB/GYN Status:  No LMP recorded. Patient has had a hysterectomy.  General Assessment Data Location of Assessment: [facility] ED TTS Assessment: In system Is this a Tele or Face-to-Face Assessment?: Face-to-Face Is this an Initial Assessment or a Re-assessment for this encounter?: Initial Assessment Patient Accompanied by:: N/A Language Other than English: No Living Arrangements: Other (Comment) What gender do you identify as?: Female Marital status: Single Living Arrangements: Children Can pt return to current living arrangement?: Yes Admission Status: Voluntary Is patient capable of signing voluntary admission?: Yes Referral Source: Self/Family/Friend Insurance type: Medicaid  Medical Screening Exam ([place]) Medical Exam completed: Yes  Crisis Care Plan Living Arrangements: Children Legal Guardian: (NA) Name of Psychiatrist: [facility] MD Name of Therapist: None  Education Status Is patient currently in school?: No Is the patient employed, unemployed or receiving disability?: Receiving disability income  Risk to self with the past 6 months Suicidal Ideation: Yes-Currently Present Has patient been a risk to self within the past 6 months prior to admission? : No Suicidal Intent: No Has patient had any suicidal intent within the past 6 months prior to admission? : No Is patient at risk for suicide?: No Suicidal Plan?: No Has patient had any suicidal plan within the past 6 months prior to admission? : No Access to Means: No What has been your use of drugs/alcohol within the last 12 months?: Denies Previous Attempts/Gestures: No How many times?: 0 Other Self Harm Risks: (off medications) Triggers for Past Attempts: (NA) Intentional Self Injurious Behavior: None Family Suicide  History: No Recent stressful life event(s): Other (Comment)(Off medications) Persecutory voices/beliefs?: No Depression: Yes Depression Symptoms: Guilt, Feeling angry/irritable Substance abuse history and/or treatment for substance abuse?: No Suicide prevention information given to non-admitted patients: Not applicable  Risk to Others within the past 6 months Homicidal Ideation: No Does patient have any lifetime risk of violence toward others beyond the six months prior to admission? : No Thoughts of Harm to Others: No Current Homicidal Intent: No Current Homicidal Plan: No Access to Homicidal Means: No Identified Victim: NA History of harm to others?: No Assessment of Violence: None Noted Violent Behavior Description: NA Does patient have access to weapons?: No Criminal Charges Pending?: No Does patient have a court date: No Is patient on probation?: No  Psychosis Hallucinations: None noted Delusions: None noted  Mental Status Report Appearance/Hygiene: In scrubs Eye Contact: Fair Motor Activity: Freedom of movement Speech: Logical/coherent Level of Consciousness: Quiet/awake Mood: Depressed Affect: Appropriate to circumstance Anxiety Level: Minimal Thought Processes: Coherent, Relevant Judgement: Partial Orientation: Person, Place, Time Obsessive Compulsive Thoughts/Behaviors: None  Cognitive Functioning Concentration: Normal Memory: Recent Intact, Remote Intact Is patient IDD: No Insight: Fair Impulse Control: Fair Appetite: Good Have you had any weight changes? : No Change Sleep: Decreased Total Hours of Sleep: 2 Vegetative Symptoms: None  ADLScreening [facility] Assessment Services) Patient's cognitive ability adequate to safely complete daily  activities?: Yes Patient able to express need for assistance with ADLs?: Yes Independently performs ADLs?: Yes (appropriate for developmental age)  Prior Inpatient Therapy Prior Inpatient Therapy: Yes Prior Therapy  Dates: 2020 Prior Therapy Facilty/Provider(s): [facility] Reason for Treatment: MH  Prior Outpatient Therapy Prior Outpatient Therapy: Yes Prior Therapy Dates: Ongoing Prior Therapy Facilty/Provider(s): Iz[facility] MD Reason for Treatment: Med mang Does patient have an ACCT team?: No Does patient have Intensive In-House Services?  : No Does patient have [facility] services? : Yes Does patient have P4CC services?: No  ADL Screening (condition at time of admission) Patient's cognitive ability adequate to safely complete daily activities?: Yes Is the patient deaf or have difficulty hearing?: No Does the patient have difficulty seeing, even when wearing glasses/contacts?: No Does the patient have difficulty concentrating, remembering, or making decisions?: No Patient able to express need for assistance with ADLs?: Yes Does the patient have difficulty dressing or bathing?: No Independently performs ADLs?: Yes (appropriate for developmental age) Does the patient have difficulty walking or climbing stairs?: No Weakness of Legs: None Weakness of Arms/Hands: None  Home Assistive Devices/Equipment Home Assistive Devices/Equipment: None  Therapy Consults (therapy consults require a physician order) PT Evaluation Needed: No OT Evalulation Needed: No SLP Evaluation Needed: No Abuse/Neglect Assessment (Assessment to be complete while patient is alone) Abuse/Neglect Assessment Can Be Completed: Yes Physical Abuse: Denies Verbal Abuse: Denies Sexual Abuse: Denies Exploitation of patient/patient's resources: Denies Self-Neglect: Denies Values / Beliefs Cultural Requests During Hospitalization: None Spiritual Requests During Hospitalization: None Consults Spiritual Care Consult Needed: No Social Work Consult Needed: No Regulatory affairs officer (For Healthcare) Does Patient Have a Medical Advance Directive?: No Would patient like information on creating a medical advance directive?: No - Patient declined           Disposition: Case was staffed with Silvio Pate FNP who reported patient met inpatient criteria and recommended a inpatient admission and be returned to [facility] once her medical issues are resolved.    Disposition Initial Assessment Completed for this Encounter: Yes  On Site Evaluation by:   Reviewed with Physician:    Mamie Nick 07/02/2019 12:32 PM

## 2019-07-02 NOTE — ED Notes (Signed)
Patient does not want to keep bp cuff on to monitor

## 2019-07-02 NOTE — ED Notes (Signed)
Pt to room 35. Pt oriented to room. Pt calm and cooperative. Pt flat, dull, blunted. Pt resting on bed. Pt given orange juice

## 2019-07-02 NOTE — ED Notes (Signed)
PA at bedside.

## 2019-07-02 NOTE — ED Notes (Signed)
Attempted to call [facility]. No answer. [facility] opens at [employer]. Will try again.

## 2019-07-02 NOTE — ED Notes (Signed)
Pt reports unable to sleep something for sleep requested via text message.

## 2019-07-02 NOTE — ED Notes (Signed)
Patient given meal tray.

## 2019-07-02 NOTE — ED Provider Notes (Signed)
Care assumed from [place], please see her note for full details, but in brief Dorothy Alvarez is a 53 y.o. female who was sent over from [facility] yesterday for blood pressure management, was not previously on medications for her blood pressure, yesterday was started on amlodipine and HCTZ and also given a dose of IV labetalol.  Patient is asymptomatic with her elevated blood pressure.  Labs show no signs of endorgan damage.  This morning patient blood pressure is elevated after receiving 12.5 mg of HCTZ and 10 mg amlodipine, but patient remains asymptomatic with this she has a bit agitated from being woken up from sleep for blood pressure recheck.  Will give additional 12.5 of HCTZ for the starting dose of 25 mg and then recheck blood pressure.  I still feel that patient is stable for discharge back to [facility] facility as she is asymptomatic with these elevated blood pressures.  Gust with [facility], and faxed over blood pressures and lab work, but due to patient's continued hypertension they will not accept the patient, she presented to [facility] initially for difficulty with sleeping over the past 4 to 5 days and suicidal ideations.  Will place TTS consult.  It looks like patient was previously on irbesartan 75 mg so we will give that for hypertension as well.  Case discussed with TTS counselor Lubertha Sayres who is seen and evaluated the patient, they recommend observation overnight, if blood pressure is improved at that time patient can return to [facility] or they can work on psych disposition for the patient at [facility].  At shift change care signed out to PA Domenic Moras who will continue to monitor blood pressure.  Patient remains asymptomatic with this hypertension.   Jacqlyn Larsen, PA-C 07/02/19 1533    Nat Christen, MD 07/03/19 [phone]

## 2019-07-02 NOTE — ED Notes (Signed)
Vital signs, medications given, and negative COVID test faxed to [facility].   SN:[id number].

## 2019-07-02 NOTE — ED Provider Notes (Signed)
53yo female here from [facility] for BP treatment, history of HTN, not on meds, no symptoms. Awaiting dispo to [facility] in the morning, needs BP management.  Physical Exam  BP (!) 167/105   Pulse 87   Temp 98.7 F (37.1 C) (Oral)   Resp 18   SpO2 97%   Physical Exam  ED Course/Procedures     Procedures  MDM  AM BP meds ordered. Plan is for patient to go to [facility] in the morning.       Tacy Learn, PA-C 07/02/19 0615    Fatima Blank, MD 07/02/19 [phone]

## 2019-07-02 NOTE — BH Assessment (Signed)
[facility] Assessment Progress Note  Case was staffed with [place] FNP who reported patient met inpatient criteria and recommended a inpatient admission and be returned to [facility] once her medical issues are resolved.      

## 2019-07-02 NOTE — ED Notes (Signed)
Report given to SAPU  

## 2019-07-02 NOTE — ED Notes (Signed)
Patient ambulated to restroom with no assistance and no problems noted.

## 2019-07-03 DIAGNOSIS — F3181 Bipolar II disorder: Secondary | ICD-10-CM

## 2019-07-03 DIAGNOSIS — Z008 Encounter for other general examination: Secondary | ICD-10-CM | POA: Diagnosis not present

## 2019-07-03 MED ORDER — CHLORPROMAZINE HCL 100 MG PO TABS: 100.0000 mg | ORAL_TABLET | Freq: Every day | ORAL | 0 refills | Status: DC

## 2019-07-03 MED ORDER — CHLORPROMAZINE HCL 25 MG PO TABS: 100.0000 mg | ORAL_TABLET | Freq: Every day | ORAL | Status: DC

## 2019-07-03 NOTE — ED Notes (Signed)
Pt appears to be asleep sleep medication held

## 2019-07-03 NOTE — Progress Notes (Signed)
Update 3:00pm  CSW aware patient is psychiatrically cleared for discharge. CSW updated intake clinician at [facility].            This patient has been accepted to [facility] for inpatient psychiatric admission TOMORROW (11/16). The facility is at capacity today.   Accepting provider: Dr.Mona Lyndel Safe  Bed: [facility]  RN Call for Report: [phone]  This patient may arrive after 10am tomorrow, 11/16. This patient is currently voluntary.  Stephanie Acre, [place] Social Worker [phone]

## 2019-07-03 NOTE — Consult Note (Addendum)
[facility] Psych ED Discharge  07/03/2019 2:24 PM Dorothy Alvarez  MRN:  [id number] Principal Problem: Bipolar II disorder [facility]) Discharge Diagnoses: Principal Problem:   Bipolar II disorder ([place])   Subjective: "I have problems with sleeping"  Dorothy Alvarez, 53 y.o., female patient presented to [facility] Emergency Department for management of elevated blood pressure and endorses suicidal ideations.  Patient seen via telepsych by this provider; chart reviewed and consulted with Dr. Darleene Cleaver on 07/03/19.    During evaluation Dorothy Alvarez reports "I can't sleep" as her primary concerns.  The patient historically has a long standing history for sleep disturbances for which she states she was previously treated with Xanax by an outpatient provider.  She has not had this medication since March because, "The provider lost his license."  She states she went [facility] 2 days prior for evaluation and treatment but was found to have elevated blood pressure and sent to the ED for further evaluation.  She also endorses a history for bipolar for which she is not taking medications and not receiving MH care.  While attempting to review how bipolar disorder can impair sleep, the patient frequently interrupts and asks for medication to help her sleep.  Her behavior is goal oreinted and she ruminates on receiving medication for her stated sleep disorder.  Several unsuccessful attempts at redirection were made.    She does not meet criteria for inpatient admission, recommend discharge home to follow-up with outpatient mental health resources as previously planned.     Total Time spent with patient: 30 minutes  Past Psychiatric History: anxiety, bipolar, depression  Past Medical History:  Past Medical History:  Diagnosis Date  . Allergy   . Anxiety   . Arthritis   . Chronic abdominal pain   . Constipation   . Depression   . Hypertension   . IBS (irritable bowel syndrome)   . Learning disability   . Neuromuscular  disorder [facility])     Past Surgical History:  Procedure Laterality Date  . ABDOMINAL HYSTERECTOMY    . GALLBLADDER SURGERY    . KNEE SURGERY     Family History:  Family History  Problem Relation Age of Onset  . Diabetes Mother   . Suicidality Mother   . Heart disease Father   . Depression Father    Family Psychiatric  History: unknwon Social History:  Social History   Substance and Sexual Activity  Alcohol Use No     Social History   Substance and Sexual Activity  Drug Use Yes  . Types: Other-see comments, Marijuana   Comment: opiates    Social History   Socioeconomic History  . Marital status: Legally Separated    Spouse name: Not on file  . Number of children: 2  . Years of education: Not on file  . Highest education level: Not on file  Occupational History  . Occupation: DISABLED    Employer: UNEMPLOYED  Social Needs  . Financial resource strain: Not on file  . Food insecurity    Worry: Not on file    Inability: Not on file  . Transportation needs    Medical: Not on file    Non-medical: Not on file  Tobacco Use  . Smoking status: Never Smoker  . Smokeless tobacco: Never Used  Substance and Sexual Activity  . Alcohol use: No  . Drug use: Yes    Types: Other-see comments, Marijuana    Comment: opiates  . Sexual activity: Not Currently    Birth control/protection:  Surgical  Lifestyle  . Physical activity    Days per week: Not on file    Minutes per session: Not on file  . Stress: Not on file  Relationships  . Social Herbalist on phone: Not on file    Gets together: Not on file    Attends religious service: Not on file    Active member of club or organization: Not on file    Attends meetings of clubs or organizations: Not on file    Relationship status: Not on file  Other Topics Concern  . Not on file  Social History Narrative  . Not on file    Has this patient used any form of tobacco in the last 30 days? (Cigarettes, Smokeless  Tobacco, Cigars, and/or Pipes) Prescription not provided because: patient denies need   Current Medications: Current Facility-Administered Medications  Medication Dose Route Frequency Provider Last Rate Last Dose  . chlorproMAZINE (THORAZINE) tablet 100 mg  100 mg Oral QHS Oniyah Rohe, MD      . irbesartan (AVAPRO) tablet 75 mg  75 mg Oral Daily Benedetto Goad N, PA-C   75 mg at 07/03/19 1129   Current Outpatient Medications  Medication Sig Dispense Refill  . diphenhydramine-acetaminophen (TYLENOL PM) 25-500 MG TABS tablet Take 2 tablets by mouth at bedtime as needed (sleep/pain).    Marland Kitchen HYDROcodone-Ibuprofen 10-200 MG TABS Take 1-2 tablets by mouth every 6 (six) hours as needed (pain).     Marland Kitchen PARoxetine (PAXIL) 10 MG tablet Take 10 mg by mouth daily.    Marland Kitchen tetrahydrozoline 0.05 % ophthalmic solution Place 1 drop into both eyes 3 (three) times daily as needed (Eye irritation). 15 mL 0  . amLODipine (NORVASC) 10 MG tablet Take 1 tablet (10 mg total) by mouth daily. 30 tablet 0  . chlorproMAZINE (THORAZINE) 50 MG tablet Take 1 tablet (50 mg total) by mouth 2 (two) times daily. For mood/sleep (Patient not taking: Reported on 07/02/2019) 30 tablet 1  . cyclobenzaprine (FLEXERIL) 10 MG tablet Take 1 tablet (10 mg total) by mouth 3 (three) times daily as needed for muscle spasms. (Patient not taking: Reported on 07/02/2019) 15 tablet 0  . fluticasone (FLONASE) 50 MCG/ACT nasal spray Place 1 spray into both nostrils daily. (Patient not taking: Reported on 07/02/2019)  0  . furosemide (LASIX) 40 MG tablet TAKE 1 TABLET BY MOUTH EVERY DAY (Patient taking differently: Take 40 mg by mouth daily. ) 90 tablet 0  . hydrochlorothiazide (HYDRODIURIL) 12.5 MG tablet Take 1 tablet (12.5 mg total) by mouth daily. 30 tablet 0  . hydrOXYzine (ATARAX/VISTARIL) 25 MG tablet Take 1 tablet (25 mg total) by mouth every 6 (six) hours as needed for anxiety. (Patient not taking: Reported on 07/02/2019) 30 tablet 0  .  irbesartan (AVAPRO) 75 MG tablet Take 1 tablet (75 mg total) by mouth daily. For high blood pressure (Patient not taking: Reported on 07/02/2019) 15 tablet 0  . loratadine (CLARITIN) 10 MG tablet Take 1 tablet (10 mg total) by mouth daily. (Patient not taking: Reported on 07/02/2019)    . LORazepam (ATIVAN) 1 MG tablet Take 1 tablet (1 mg total) by mouth every 6 (six) hours as needed (CIWA > 10). (Patient not taking: Reported on 07/02/2019) 2 tablet 0  . meloxicam (MOBIC) 7.5 MG tablet Take 1 tablet (7.5 mg total) by mouth 2 (two) times daily. For pain (Patient not taking: Reported on 07/02/2019) 30 tablet 1  . Multiple Vitamin (MULTIVITAMIN WITH MINERALS)  TABS tablet Take 1 tablet by mouth daily. (Patient not taking: Reported on 07/02/2019)    . potassium chloride SA (K-DUR,KLOR-CON) 20 MEQ tablet Take 1 tablet (20 mEq total) by mouth daily. (Patient not taking: Reported on 07/02/2019) 30 tablet 5   PTA Medications: (Not in a hospital admission)   Musculoskeletal: Unable to assess via telepsych  Psychiatric Specialty Exam: Physical Exam  Constitutional: She is oriented to person, place, and time. She appears well-developed.  Eyes: Pupils are equal, round, and reactive to light.  Neck: Normal range of motion.  Cardiovascular: Normal rate.  Respiratory: Effort normal.  Musculoskeletal: Normal range of motion.  Neurological: She is alert and oriented to person, place, and time.    Review of Systems  Psychiatric/Behavioral: Negative for depression, hallucinations and suicidal ideas. The patient has insomnia (endorses chronic pmhx). The patient is not nervous/anxious.     Blood pressure (!) 130/94, pulse (!) 102, temperature 99.4 F (37.4 C), temperature source Oral, resp. rate 17, SpO2 100 %.There is no height or weight on file to calculate BMI.  General Appearance: Casual  Eye Contact:  Good  Speech:  Clear and Coherent and Slow  Volume:  Normal  Mood:  Anxious  Affect:  Restricted   Thought Process:  Coherent, Goal Directed and Descriptions of Associations: Intact  Orientation:  Full (Time, Place, and Person)  Thought Content:  Logical and Rumination  Suicidal Thoughts:  No  Homicidal Thoughts:  No  Memory:  Immediate;   Good Recent;   Good Remote;   Good  Judgement:  Good  Insight:  Good and Fair  Psychomotor Activity:  Normal  Concentration:  Concentration: Fair and Attention Span: Fair  Recall:  Good  Fund of Knowledge:  Good  Language:  Good  Akathisia:  Negative  Handed:  Right  AIMS (if indicated):     Assets:  Communication Skills Resilience Social Support  ADL's:  Intact  Cognition:  WNL  Sleep:   >6 hours     Demographic Factors:  NA  Loss Factors: NA  Historical Factors: Impulsivity  Risk Reduction Factors:   Sense of responsibility to family, Living with another person, especially a relative and Positive social support  Continued Clinical Symptoms:  Depression:   Insomnia  Cognitive Features That Contribute To Risk:  Closed-mindedness    Suicide Risk:  Mild:  Suicidal ideation of limited frequency, intensity, duration, and specificity.  There are no identifiable plans, no associated intent, mild dysphoria and related symptoms, good self-control (both objective and subjective assessment), few other risk factors, and identifiable protective factors, including available and accessible social support.  Follow-up Information    Go to  [facility].   Contact information: [address] [place] [address] [phone]           Plan Of Care/Follow-up recommendations:  Activity:  as tolerated Diet:  as tolerated Other:  follow-up with outpatient [place] provider as discussed  Disposition: Patient is psych cleared.  Discharge home.  The patient appears reasonably screened and/or stabilized for discharge and does not appear to have emergency medical/psychiatric concerns/conditions requiring further screening, evaluation, or  treatment at this time prior to discharge.    Take all of you medications as prescribed by your mental healthcare provider.  Report any adverse effects and reactions from your medications to your outpatient provider promptly.  Do not engage in alcohol and or illegal drug use while on prescription medicines. Keep all scheduled appointments. This is to ensure that you are getting  refills on time and to avoid any interruption in your medication.  If you are unable to keep an appointment call to reschedule.  Be sure to follow up with resources and follow ups given. In the event of worsening symptoms call the crisis hotline, 911, and or go to the nearest emergency department for appropriate evaluation and treatment of symptoms. Follow-up with your primary care provider for your medical issues, concerns and or health care needs.    Mallie Darting, NP 07/03/2019, 2:24 PM  Patient seen face-to-face for psychiatric evaluation, chart reviewed and case discussed with the physician extender and developed treatment plan. Reviewed the information documented and agree with the treatment plan. Corena Pilgrim, MD

## 2019-07-03 NOTE — ED Notes (Signed)
Pt discharged home. Discharged instructions read to pt who verbalized understanding. All belongings returned to pt. Denies SI/HI, is not delusional and not responding to internal stimuli. Escorted pt to the ED exit.   

## 2019-07-03 NOTE — Progress Notes (Addendum)
This patient continues to meet inpatient criteria. CSW faxed information to the following facilities:   [facility]- accepted for 11/16 [facility] is at capacity.  At this time, this patient is voluntary.  Stephanie Acre, [place] Social Worker [phone]

## 2019-07-03 NOTE — Progress Notes (Signed)
This patient has been accepted to [facility] for inpatient psychiatric admission TOMORROW (11/16). The facility is at capacity today.   Accepting provider: Dr.Mona Lyndel Safe  Bed: [facility]  RN Call for Report: [phone]  This patient may arrive after 10am tomorrow, 11/16. This patient is currently voluntary.  Stephanie Acre, [place] Social Worker [phone]

## 2020-01-10 ENCOUNTER — Other Ambulatory Visit: Payer: Self-pay | Admitting: Internal Medicine

## 2020-01-10 DIAGNOSIS — E2839 Other primary ovarian failure: Secondary | ICD-10-CM

## 2020-01-10 DIAGNOSIS — Z1231 Encounter for screening mammogram for malignant neoplasm of breast: Secondary | ICD-10-CM

## 2020-05-01 ENCOUNTER — Inpatient Hospital Stay (HOSPITAL_COMMUNITY)
Admission: EM | Admit: 2020-05-01 | Discharge: 2020-05-04 | DRG: 286 | Disposition: A | Discharge status: Home / Self Care | Payer: Medicare HMO | Attending: Internal Medicine | Admitting: Internal Medicine

## 2020-05-01 ENCOUNTER — Other Ambulatory Visit: Payer: Self-pay

## 2020-05-01 ENCOUNTER — Emergency Department (HOSPITAL_COMMUNITY): Payer: Medicare HMO

## 2020-05-01 ENCOUNTER — Encounter (HOSPITAL_COMMUNITY): Payer: Self-pay

## 2020-05-01 DIAGNOSIS — Z885 Allergy status to narcotic agent status: Secondary | ICD-10-CM | POA: Diagnosis not present

## 2020-05-01 DIAGNOSIS — I5041 Acute combined systolic (congestive) and diastolic (congestive) heart failure: Secondary | ICD-10-CM | POA: Diagnosis not present

## 2020-05-01 DIAGNOSIS — Z880 Allergy status to penicillin: Secondary | ICD-10-CM

## 2020-05-01 DIAGNOSIS — G4733 Obstructive sleep apnea (adult) (pediatric): Secondary | ICD-10-CM | POA: Diagnosis present

## 2020-05-01 DIAGNOSIS — Z9114 Patient's other noncompliance with medication regimen: Secondary | ICD-10-CM | POA: Diagnosis not present

## 2020-05-01 DIAGNOSIS — R0602 Shortness of breath: Secondary | ICD-10-CM

## 2020-05-01 DIAGNOSIS — I2721 Secondary pulmonary arterial hypertension: Secondary | ICD-10-CM | POA: Diagnosis not present

## 2020-05-01 DIAGNOSIS — I130 Hypertensive heart and chronic kidney disease with heart failure and stage 1 through stage 4 chronic kidney disease, or unspecified chronic kidney disease: Secondary | ICD-10-CM | POA: Diagnosis present

## 2020-05-01 DIAGNOSIS — I2510 Atherosclerotic heart disease of native coronary artery without angina pectoris: Secondary | ICD-10-CM | POA: Diagnosis not present

## 2020-05-01 DIAGNOSIS — I161 Hypertensive emergency: Secondary | ICD-10-CM | POA: Diagnosis present

## 2020-05-01 DIAGNOSIS — I158 Other secondary hypertension: Secondary | ICD-10-CM | POA: Diagnosis present

## 2020-05-01 DIAGNOSIS — Z20822 Contact with and (suspected) exposure to covid-19: Secondary | ICD-10-CM | POA: Diagnosis present

## 2020-05-01 DIAGNOSIS — Z818 Family history of other mental and behavioral disorders: Secondary | ICD-10-CM | POA: Diagnosis not present

## 2020-05-01 DIAGNOSIS — I272 Pulmonary hypertension, unspecified: Secondary | ICD-10-CM | POA: Diagnosis present

## 2020-05-01 DIAGNOSIS — I361 Nonrheumatic tricuspid (valve) insufficiency: Secondary | ICD-10-CM | POA: Diagnosis not present

## 2020-05-01 DIAGNOSIS — K761 Chronic passive congestion of liver: Secondary | ICD-10-CM | POA: Diagnosis present

## 2020-05-01 DIAGNOSIS — E669 Obesity, unspecified: Secondary | ICD-10-CM | POA: Diagnosis present

## 2020-05-01 DIAGNOSIS — Z8249 Family history of ischemic heart disease and other diseases of the circulatory system: Secondary | ICD-10-CM | POA: Diagnosis not present

## 2020-05-01 DIAGNOSIS — I509 Heart failure, unspecified: Secondary | ICD-10-CM

## 2020-05-01 DIAGNOSIS — N182 Chronic kidney disease, stage 2 (mild): Secondary | ICD-10-CM | POA: Diagnosis present

## 2020-05-01 DIAGNOSIS — Z886 Allergy status to analgesic agent status: Secondary | ICD-10-CM

## 2020-05-01 DIAGNOSIS — R7989 Other specified abnormal findings of blood chemistry: Secondary | ICD-10-CM | POA: Diagnosis present

## 2020-05-01 DIAGNOSIS — I10 Essential (primary) hypertension: Secondary | ICD-10-CM | POA: Diagnosis not present

## 2020-05-01 DIAGNOSIS — I428 Other cardiomyopathies: Secondary | ICD-10-CM | POA: Diagnosis present

## 2020-05-01 DIAGNOSIS — Z90710 Acquired absence of both cervix and uterus: Secondary | ICD-10-CM | POA: Diagnosis not present

## 2020-05-01 DIAGNOSIS — Z888 Allergy status to other drugs, medicaments and biological substances status: Secondary | ICD-10-CM

## 2020-05-01 DIAGNOSIS — F329 Major depressive disorder, single episode, unspecified: Secondary | ICD-10-CM | POA: Diagnosis present

## 2020-05-01 DIAGNOSIS — Z6837 Body mass index (BMI) 37.0-37.9, adult: Secondary | ICD-10-CM | POA: Diagnosis not present

## 2020-05-01 DIAGNOSIS — E876 Hypokalemia: Secondary | ICD-10-CM | POA: Diagnosis present

## 2020-05-01 DIAGNOSIS — N179 Acute kidney failure, unspecified: Secondary | ICD-10-CM | POA: Diagnosis present

## 2020-05-01 DIAGNOSIS — R109 Unspecified abdominal pain: Secondary | ICD-10-CM

## 2020-05-01 DIAGNOSIS — Z79899 Other long term (current) drug therapy: Secondary | ICD-10-CM

## 2020-05-01 DIAGNOSIS — I340 Nonrheumatic mitral (valve) insufficiency: Secondary | ICD-10-CM | POA: Diagnosis not present

## 2020-05-01 DIAGNOSIS — I5021 Acute systolic (congestive) heart failure: Secondary | ICD-10-CM | POA: Diagnosis not present

## 2020-05-01 DIAGNOSIS — I5043 Acute on chronic combined systolic (congestive) and diastolic (congestive) heart failure: Secondary | ICD-10-CM | POA: Diagnosis present

## 2020-05-01 LAB — CBC
HCT: 36.7 % (ref 36.0–46.0)
Hemoglobin: 11.9 g/dL — ABNORMAL LOW (ref 12.0–15.0)
MCH: 31.4 pg (ref 26.0–34.0)
MCHC: 32.4 g/dL (ref 30.0–36.0)
MCV: 96.8 fL (ref 80.0–100.0)
Platelets: 296 10*3/uL (ref 150–400)
RBC: 3.79 MIL/uL — ABNORMAL LOW (ref 3.87–5.11)
RDW: 14.9 % (ref 11.5–15.5)
WBC: 10.5 10*3/uL (ref 4.0–10.5)
nRBC: 0.3 % — ABNORMAL HIGH (ref 0.0–0.2)

## 2020-05-01 LAB — BASIC METABOLIC PANEL
Anion gap: 15 (ref 5–15)
BUN: 18 mg/dL (ref 6–20)
CO2: 24 mmol/L (ref 22–32)
Calcium: 9.4 mg/dL (ref 8.9–10.3)
Chloride: 106 mmol/L (ref 98–111)
Creatinine, Ser: 1.04 mg/dL — ABNORMAL HIGH (ref 0.44–1.00)
GFR calc Af Amer: >60 mL/min (ref >60)
GFR calc non Af Amer: >60 mL/min (ref >60)
Glucose, Bld: 137 mg/dL — ABNORMAL HIGH (ref 70–99)
Potassium: 3.7 mmol/L (ref 3.5–5.1)
Sodium: 145 mmol/L (ref 135–145)

## 2020-05-01 LAB — TROPONIN I (HIGH SENSITIVITY)
Troponin I (High Sensitivity): 83 ng/L — ABNORMAL HIGH (ref <18)
Troponin I (High Sensitivity): 84 ng/L — ABNORMAL HIGH (ref <18)

## 2020-05-01 LAB — I-STAT BETA HCG BLOOD, ED (NOT ORDERABLE): I-stat hCG, quantitative: <5 m[IU]/mL (ref <5)

## 2020-05-01 LAB — BRAIN NATRIURETIC PEPTIDE: B Natriuretic Peptide: 1560.3 pg/mL — ABNORMAL HIGH (ref 0.0–100.0)

## 2020-05-01 LAB — SARS CORONAVIRUS 2 BY RT PCR (HOSPITAL ORDER, PERFORMED IN ~~LOC~~ HOSPITAL LAB): SARS Coronavirus 2: NEGATIVE

## 2020-05-01 MED ORDER — NITROGLYCERIN 0.4 MG SL SUBL: 0.4000 mg | SUBLINGUAL_TABLET | SUBLINGUAL | Status: DC | PRN

## 2020-05-01 MED ORDER — ENOXAPARIN SODIUM 60 MG/0.6ML ~~LOC~~ SOLN
0.5000 mg/kg | SUBCUTANEOUS | Status: DC
  Administered 2020-05-02 (×2): 52.5 mg via SUBCUTANEOUS
  Filled 2020-05-01: qty 0.6
  Filled 2020-05-01: qty 0.53
  Filled 2020-05-01: qty 0.6

## 2020-05-01 MED ORDER — LISINOPRIL 5 MG PO TABS
5.0000 mg | ORAL_TABLET | Freq: Every day | ORAL | Status: DC
  Administered 2020-05-01 – 2020-05-02 (×2): 5 mg via ORAL
  Filled 2020-05-01 (×2): qty 1

## 2020-05-01 MED ORDER — FUROSEMIDE 10 MG/ML IJ SOLN: 40.0000 mg | Freq: Every day | INTRAMUSCULAR | Status: DC

## 2020-05-01 MED ORDER — AMLODIPINE BESYLATE 5 MG PO TABS
5.0000 mg | ORAL_TABLET | Freq: Once | ORAL | Status: AC
  Administered 2020-05-01: 5 mg via ORAL
  Filled 2020-05-01: qty 1

## 2020-05-01 MED ORDER — ENOXAPARIN SODIUM 40 MG/0.4ML ~~LOC~~ SOLN: 40.0000 mg | SUBCUTANEOUS | Status: DC

## 2020-05-01 MED ORDER — SODIUM CHLORIDE 0.9 % IV SOLN: 250.0000 mL | INTRAVENOUS | Status: DC | PRN

## 2020-05-01 MED ORDER — SODIUM CHLORIDE 0.9% FLUSH: 3.0000 mL | INTRAVENOUS | Status: DC | PRN

## 2020-05-01 MED ORDER — ACETAMINOPHEN 325 MG PO TABS
650.0000 mg | ORAL_TABLET | ORAL | Status: DC | PRN
  Administered 2020-05-02 – 2020-05-04 (×5): 650 mg via ORAL
  Filled 2020-05-01 (×5): qty 2

## 2020-05-01 MED ORDER — POTASSIUM CHLORIDE CRYS ER 20 MEQ PO TBCR
40.0000 meq | EXTENDED_RELEASE_TABLET | Freq: Once | ORAL | Status: AC
  Administered 2020-05-01: 40 meq via ORAL
  Filled 2020-05-01: qty 2

## 2020-05-01 MED ORDER — SODIUM CHLORIDE 0.9% FLUSH
3.0000 mL | Freq: Two times a day (BID) | INTRAVENOUS | Status: DC
  Administered 2020-05-01 – 2020-05-03 (×3): 3 mL via INTRAVENOUS

## 2020-05-01 MED ORDER — ONDANSETRON HCL 4 MG/2ML IJ SOLN: 4.0000 mg | Freq: Four times a day (QID) | INTRAMUSCULAR | Status: DC | PRN

## 2020-05-01 MED ORDER — FUROSEMIDE 10 MG/ML IJ SOLN
40.0000 mg | Freq: Once | INTRAMUSCULAR | Status: AC
  Administered 2020-05-01: 40 mg via INTRAVENOUS
  Filled 2020-05-01: qty 4

## 2020-05-01 NOTE — ED Notes (Signed)
Ambulating patient for 3 min. O2 started at 98 went down to 91

## 2020-05-01 NOTE — ED Triage Notes (Addendum)
Pt arrives today c/o SHOB. Pt states this has been going on for 5 days. Pt denies cough, fever, etc.  Pt's daughter tested positive for COVID yesterday. Pt did not disclose this in triage.

## 2020-05-01 NOTE — ED Provider Notes (Signed)
Emergency Department Provider Note   I have reviewed the triage vital signs and the nursing notes.   HISTORY  Chief Complaint Shortness of Breath   HPI Dorothy Alvarez is a 54 y.o. female with past medical history reviewed below presents to the emergency department with increasing shortness of breath over the past 5 days.  The triage note mentions the patient's daughter testing positive for Covid yesterday but patient states that this is not true.  She suspects this may be related to a different patient.  She has not had fever, chills, cough.  She is feeling shortness of breath and waking up gasping for air at night which is new for her.  She is having increasing leg swelling.  She is not having to sleep propped up but is feeling increasingly short of breath especially with exertion.  She is not experiencing chest pain.  Patient tells me that she has been prescribed Lasix in the past with lower extremity swelling but is not aware of a CHF diagnosis.  She has not taken her Lasix or other medicines in the past month at least. No radiation of symptoms or other modifying factors.   Past Medical History:  Diagnosis Date  . Allergy   . Anxiety   . Arthritis   . Chronic abdominal pain   . Constipation   . Depression   . Hypertension   . IBS (irritable bowel syndrome)   . Learning disability   . Neuromuscular disorder [facility])     Patient Active Problem List   Diagnosis Date Noted  . Acute CHF (congestive heart failure) ([place]) 05/01/2020  . AKI (acute kidney injury) ([place]) 05/01/2020  . BMI 37.0-37.9, adult 05/01/2020  . Benzodiazepine dependence ([place]) 10/22/2018  . Bipolar II disorder ([place])   . MDD (major depressive disorder), recurrent episode, severe ([place]) 10/20/2018  . Learning disability 06/25/2015  . Illiterate 06/25/2015  . Adjustment disorder with depressed mood 06/24/2014  . Stress reaction causing mixed disturbance of emotion and conduct   . Opiate dependence, continuous ([place])  06/16/2014  . 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 GAD (generalized anxiety disorder) 06/16/2014  . Major depressive disorder, recurrent, severe without psychotic features ([place]) 06/16/2014  . Major depressive disorder, recurrent episode, moderate ([place]) 04/13/2014  . Insomnia 04/13/2014  . DYSPNEA 07/05/2009  . VAGINITIS 07/04/2009  . OVARIAN CYST 07/04/2009  . Essential hypertension 07/03/2009  . CARDIOMEGALY 07/03/2009  . PLEURAL EFFUSION 07/03/2009  . DIVERTICULITIS, COLON 07/03/2009  . PERSONAL HX COLONIC POLYPS 07/03/2009    Past Surgical History:  Procedure Laterality Date  . ABDOMINAL HYSTERECTOMY    . GALLBLADDER SURGERY    . KNEE SURGERY      Allergies Ketorolac, Morphine and related, Penicillins, and Trazodone and nefazodone  Family History  Problem Relation Age of Onset  . Diabetes Mother   . Suicidality Mother   . Heart disease Father   . Depression Father     Social History Social History   Tobacco Use  . Smoking status: Never Smoker  . Smokeless tobacco: Never Used  Substance Use Topics  . Alcohol use: No  . Drug use: Yes    Types: Other-see comments, Marijuana    Comment: opiates    Review of Systems  Constitutional: No fever/chills Eyes: No visual changes. ENT: No sore throat. Cardiovascular: Denies chest pain. Respiratory: Positive shortness of breath. Gastrointestinal: No abdominal pain.  No nausea, no vomiting.  No diarrhea.  No constipation. Genitourinary: Negative for dysuria. Musculoskeletal:  Negative for back pain. Skin: Negative for rash. Neurological: Negative for headaches, focal weakness or numbness.  10-point ROS otherwise negative.  ____________________________________________   PHYSICAL EXAM:  VITAL SIGNS: ED Triage Vitals  Enc Vitals Group     BP 05/01/20 1055 (!) 183/169     Pulse Rate 05/01/20 1055 (!) 109     Resp 05/01/20 1055 16     Temp 05/01/20 1055 97.8 F (36.6 C)     Temp  Source 05/01/20 1055 Oral     SpO2 05/01/20 1055 97 %     Weight 05/01/20 1655 235 lb (106.6 kg)     Height 05/01/20 1655 5\' 6"  (1.676 m)   Constitutional: Alert and oriented. Well appearing and in no acute distress. Eyes: Conjunctivae are normal. Head: Atraumatic. Nose: No congestion/rhinnorhea. Mouth/Throat: Mucous membranes are moist. Neck: No stridor.   Cardiovascular: Tachycardia. Good peripheral circulation. Grossly normal heart sounds.   Respiratory: Slight increased respiratory effort.  No retractions. Lungs without wheezing. Positive crackles at the bases.  Gastrointestinal: Soft and nontender. No distention.  Musculoskeletal: No lower extremity tenderness positive 2+ pitting edema bilaterally. No gross deformities of extremities. Neurologic:  Normal speech and language.  Skin:  Skin is warm, dry and intact. No rash noted.  ____________________________________________   LABS (all labs ordered are listed, but only abnormal results are displayed)  Labs Reviewed  BASIC METABOLIC PANEL - Abnormal; Notable for the following components:      Result Value   Glucose, Bld 137 (*)    Creatinine, Ser 1.04 (*)    All other components within normal limits  CBC - Abnormal; Notable for the following components:   RBC 3.79 (*)    Hemoglobin 11.9 (*)    nRBC 0.3 (*)    All other components within normal limits  BRAIN NATRIURETIC PEPTIDE - Abnormal; Notable for the following components:   B Natriuretic Peptide 1,560.3 (*)    All other components within normal limits  BASIC METABOLIC PANEL - Abnormal; Notable for the following components:   Glucose, Bld 148 (*)    All other components within normal limits  TROPONIN I (HIGH SENSITIVITY) - Abnormal; Notable for the following components:   Troponin I (High Sensitivity) 83 (*)    All other components within normal limits  TROPONIN I (HIGH SENSITIVITY) - Abnormal; Notable for the following components:   Troponin I (High Sensitivity) 84  (*)    All other components within normal limits  SARS CORONAVIRUS 2 BY RT PCR (HOSPITAL ORDER, [facility])  HIV ANTIBODY (ROUTINE TESTING W REFLEX)  I-STAT BETA HCG BLOOD, ED ([facility], [facility], [facility] ONLY)  I-STAT BETA HCG BLOOD, ED (NOT ORDERABLE)   ____________________________________________  EKG   EKG Interpretation  Date/Time:  Tuesday May 01 2020 10:54:10 EDT Ventricular Rate:  108 PR Interval:    QRS Duration: 86 QT Interval:  364 QTC Calculation: 488 R Axis:   47 Text Interpretation: Sinus tachycardia Probable left atrial enlargement Nonspecific T abnormalities, diffuse leads Borderline prolonged QT interval 12 Lead; [place] No STEMI Confirmed by Nanda Quinton [phone]) on 05/01/2020 4:30:22 PM Also confirmed by Nanda Quinton [phone]), editor Hattie Perch ([id number])  on 05/02/2020 8:16:48 AM       ____________________________________________  RADIOLOGY  DG Chest 2 View  Result Date: 05/01/2020 CLINICAL DATA:  Shortness of breath over the last 5 days. History of hypertension. Coronavirus exposure. EXAM: CHEST - 2 VIEW COMPARISON:  01/11/2014 FINDINGS: Mild cardiac enlargement. Hazy and patchy widespread  bilateral pulmonary density. Small amount of fluid in the fissures. The appearance could be due to either congestive heart failure for viral pneumonia. CHF is favored. No significant bone finding. IMPRESSION: Widespread hazy and patchy pulmonary density. This could represent congestive heart failure or viral pneumonia. CHF is favored. Electronically Signed   By: Nelson Chimes M.D.   On: 05/01/2020 11:46    ____________________________________________   PROCEDURES  Procedure(s) performed:   Procedures  CRITICAL CARE Performed by: Margette Fast Total critical care time: 35 minutes Critical care time was exclusive of separately billable procedures and treating other patients. Critical care was necessary to treat or prevent imminent or  life-threatening deterioration. Critical care was time spent personally by me on the following activities: development of treatment plan with patient and/or surrogate as well as nursing, discussions with consultants, evaluation of patient's response to treatment, examination of patient, obtaining history from patient or surrogate, ordering and performing treatments and interventions, ordering and review of laboratory studies, ordering and review of radiographic studies, pulse oximetry and re-evaluation of patient's condition.  Nanda Quinton, MD Emergency Medicine  ____________________________________________   INITIAL IMPRESSION / ASSESSMENT AND PLAN / ED COURSE  Pertinent labs & imaging results that were available during my care of the patient were reviewed by me and considered in my medical decision making (see chart for details).   Patient presents to the emergency department for evaluation of shortness of breath.  She is describing PND and having lower leg swelling.  Chest x-ray reviewed showing widespread hazy appearance to both lung fields.  Favor CHF rather than viral pneumonia.  Will send Covid testing.  Patient's BNP is greater than 1500.  She is not hypoxemic at rest but will ambulate on pulse ox because in my evaluation immediately after arrival to the acute care area after walking a short distance she was having increased work of breathing.  Plan for Lasix here along with nitro and her home amlodipine. Presentation not consistent with flash pulmonary edema.   Patient with no hypoxemia but increased WOB with ambulation in the ED. Patient had good response to lasix. Will admit for ECHO and further diuresis.   Discussed patient's case with [facility] to request admission. Patient and family (if present) updated with plan. Care transferred to [facility] service.  I reviewed all nursing notes, vitals, pertinent old records, EKGs, labs, imaging (as  available).  ____________________________________________  FINAL CLINICAL IMPRESSION(S) / ED DIAGNOSES  Final diagnoses:  Acute congestive heart failure, unspecified heart failure type (HCC)  SOB (shortness of breath)     MEDICATIONS GIVEN DURING THIS VISIT:  Medications  nitroGLYCERIN (NITROSTAT) SL tablet 0.4 mg (has no administration in time range)  sodium chloride flush (NS) 0.9 % injection 3 mL (3 mLs Intravenous Given 05/01/20 2143)  sodium chloride flush (NS) 0.9 % injection 3 mL (has no administration in time range)  0.9 %  sodium chloride infusion (has no administration in time range)  acetaminophen (TYLENOL) tablet 650 mg (has no administration in time range)  ondansetron (ZOFRAN) injection 4 mg (has no administration in time range)  lisinopril (ZESTRIL) tablet 5 mg (5 mg Oral Given 05/01/20 2136)  enoxaparin (LOVENOX) injection 52.5 mg (52.5 mg Subcutaneous Given 05/02/20 0600)  furosemide (LASIX) injection 40 mg (has no administration in time range)  furosemide (LASIX) injection 40 mg (has no administration in time range)  albuterol (PROVENTIL) (2.5 MG/3ML) 0.083% nebulizer solution 2.5 mg (has no administration in time range)  furosemide (LASIX) injection 40 mg (40  mg Intravenous Given 05/01/20 1736)  amLODipine (NORVASC) tablet 5 mg (5 mg Oral Given 05/01/20 1735)  potassium chloride SA (KLOR-CON) CR tablet 40 mEq (40 mEq Oral Given 05/01/20 2137)  zolpidem (AMBIEN) tablet 5 mg (5 mg Oral Given 05/02/20 0133)    Note:  This document was prepared using Dragon voice recognition software and may include unintentional dictation errors.  Nanda Quinton, MD, [facility] Emergency Medicine    Sharlyne Koeneman, [facility], MD 05/02/20 1019

## 2020-05-01 NOTE — H&P (Signed)
History and Physical    Dorothy Alvarez YKD:[id number] DOB: September 03, 1965 DOA: 05/01/2020  PCP: Nolene Ebbs, MD  Patient coming from: Home  I have personally briefly reviewed patient's old medical records in [place]  Chief Complaint: Increasing shortness of breath  HPI: Dorothy Alvarez is a 54 y.o. female with medical history significant for hypertension and depression who presents with concerns of increasing shortness of breath.  For the past 3 weeks, patient reports that her husband was telling her that it did not seem like she was breathing at night in her sleep.  She herself has woken up out of bed gasping for air.  She is having to sit up and is afraid that if she lay flat and go to sleep she would not wake up.  She also progressively has felt increasing dyspnea with exertion and at rest.  Denies any coughing or fevers.  No chest pain but sometimes feel like her heart is racing.  Over the last 3 days she is also noticed increased lower extremity edema around her ankles.  No history of recent surgery. Patient has history of hypertension and was noted to have been prescribed amlodipine, HCTZ and an ARB in the past but admits that she has not been compliant with these medications. She denies any tobacco, alcohol or illicit drug use.  No family history of cardiac issues.  ED Course: She was afebrile, tachycardic and hypertensive up to systolic of 397Q over 734L on room air. Lab work notable for BNP of 1560.  Troponin flat at 83 and 84.  EKG shows nonspecific T wave changes to lateral leads. CBC showed no leukocytosis, has mild anemia with hemoglobin of 11.9.  Blood glucose of 137.  Creatinine mildly elevated 1.04.  Chest x-ray showing hazy and patchy pulmonary density favoring likely CHF.  Review of Systems:  Constitutional: No Weight Change, No Fever ENT/Mouth: No sore throat, No Rhinorrhea Eyes: No Eye Pain, No Vision Changes Cardiovascular: No Chest Pain, + SOB, +PND, + Dyspnea on  Exertion, + Orthopnea, No Claudication, +Edema, No Palpitations Respiratory: No Cough, No Sputum, + Wheezing, no Dyspnea  Gastrointestinal: No Nausea, No Vomiting Genitourinary: no Urinary Incontinence Musculoskeletal: No Arthralgias, No Myalgias Skin: No Skin Lesions, No Pruritus, Neuro: no Weakness, No Numbness Psych: No Anxiety/Panic, No Depression, no decrease appetite Heme/Lymph: No Bruising, No Bleeding   Past Medical History:  Diagnosis Date  . Allergy   . Anxiety   . Arthritis   . Chronic abdominal pain   . Constipation   . Depression   . Hypertension   . IBS (irritable bowel syndrome)   . Learning disability   . Neuromuscular disorder [facility])     Past Surgical History:  Procedure Laterality Date  . ABDOMINAL HYSTERECTOMY    . GALLBLADDER SURGERY    . KNEE SURGERY       reports that she has never smoked. She has never used smokeless tobacco. She reports current drug use. Drugs: Other-see comments and Marijuana. She reports that she does not drink alcohol. Social History  Allergies  Allergen Reactions  . Ketorolac Other (See Comments)    Anxiety and confusion  . Morphine And Related Other (See Comments)    Mental status changes (confusion and anxiety)  . Penicillins Itching    Has patient had a PCN reaction causing immediate rash, facial/tongue/throat swelling, SOB or lightheadedness with hypotension: Yes Has patient had a PCN reaction causing severe rash involving mucus membranes or skin necrosis: No Has patient had a  PCN reaction that required hospitalization No Has patient had a PCN reaction occurring within the last 10 years: No If all of the above answers are "NO", then may proceed with Cephalosporin use.   . Trazodone And Nefazodone     Restless and opposite effect.     Family History  Problem Relation Age of Onset  . Diabetes Mother   . Suicidality Mother   . Heart disease Father   . Depression Father      Prior to Admission medications     Medication Sig Start Date End Date Taking? Authorizing Provider  amLODipine (NORVASC) 10 MG tablet Take 1 tablet (10 mg total) by mouth daily. 07/01/19  Yes Couture, Cortni S, PA-C  chlorproMAZINE (THORAZINE) 100 MG tablet Take 1 tablet (100 mg total) by mouth at bedtime. 07/03/19  Yes Merlyn Lot E, NP  gabapentin (NEURONTIN) 100 MG capsule Take 100 mg by mouth 3 (three) times daily. 02/07/20  Yes [provider]  PARoxetine (PAXIL) 10 MG tablet Take 10 mg by mouth daily.   Yes [provider]  QUEtiapine (SEROQUEL) 300 MG tablet Take 600 mg by mouth at bedtime. 03/17/20  Yes [provider]  tetrahydrozoline 0.05 % ophthalmic solution Place 1 drop into both eyes 3 (three) times daily as needed (Eye irritation). 10/22/18  Yes Connye Burkitt, NP  cyclobenzaprine (FLEXERIL) 10 MG tablet Take 1 tablet (10 mg total) by mouth 3 (three) times daily as needed for muscle spasms. Patient not taking: Reported on 07/02/2019 03/26/19   Drenda Freeze, MD  fluticasone [facility]) 50 MCG/ACT nasal spray Place 1 spray into both nostrils daily. Patient not taking: Reported on 07/02/2019 10/23/18   Connye Burkitt, NP  furosemide (LASIX) 40 MG tablet TAKE 1 TABLET BY MOUTH EVERY DAY Patient not taking: Reported on 05/01/2020 10/05/15   Ivar Drape D, PA  hydrochlorothiazide (HYDRODIURIL) 12.5 MG tablet Take 1 tablet (12.5 mg total) by mouth daily. Patient not taking: Reported on 05/01/2020 07/01/19 07/31/19  Couture, Cortni S, PA-C  hydrOXYzine (ATARAX/VISTARIL) 25 MG tablet Take 1 tablet (25 mg total) by mouth every 6 (six) hours as needed for anxiety. Patient not taking: Reported on 07/02/2019 10/22/18   Connye Burkitt, NP  irbesartan (AVAPRO) 75 MG tablet Take 1 tablet (75 mg total) by mouth daily. For high blood pressure Patient not taking: Reported on 07/02/2019 03/26/19   Drenda Freeze, MD  loratadine (CLARITIN) 10 MG tablet Take 1 tablet (10 mg total) by mouth daily. Patient  not taking: Reported on 07/02/2019 10/23/18   Connye Burkitt, NP  meloxicam (MOBIC) 7.5 MG tablet Take 1 tablet (7.5 mg total) by mouth 2 (two) times daily. For pain Patient not taking: Reported on 07/02/2019 10/22/18   Connye Burkitt, NP  Multiple Vitamin (MULTIVITAMIN WITH MINERALS) TABS tablet Take 1 tablet by mouth daily. Patient not taking: Reported on 07/02/2019 10/23/18   Connye Burkitt, NP  potassium chloride SA (K-DUR,KLOR-CON) 20 MEQ tablet Take 1 tablet (20 mEq total) by mouth daily. Patient not taking: Reported on 07/02/2019 07/05/15   Darreld Mclean, MD    Physical Exam: Vitals:   05/01/20 1652 05/01/20 1655 05/01/20 1735 05/01/20 1800  BP: (!) 195/138  (!) 181/134 (!) 186/147  Pulse: (!) 106   (!) 103  Resp: (!) 27   (!) 29  Temp: 98 F (36.7 C)     TempSrc: Oral     SpO2: 96%   93%  Weight:  106.6  kg    Height:  5\' 6"  (1.676 m)      Constitutional: NAD, calm, comfortable, obese female sitting upright at 45 degree angle in bed Vitals:   05/01/20 1652 05/01/20 1655 05/01/20 1735 05/01/20 1800  BP: (!) 195/138  (!) 181/134 (!) 186/147  Pulse: (!) 106   (!) 103  Resp: (!) 27   (!) 29  Temp: 98 F (36.7 C)     TempSrc: Oral     SpO2: 96%   93%  Weight:  106.6 kg    Height:  5\' 6"  (1.676 m)     Eyes: PERRL, lids and conjunctivae normal ENMT: Mucous membranes are moist.  Neck: normal, supple Respiratory: Bibasilar crackles but no wheezing. Normal respiratory effort on room air. No accessory muscle use.  Able to speak in full sentences. Cardiovascular: Tachycardic with irregular rhythm, no murmurs / rubs / gallops.  Mild nonpitting edema bilateral malleolus and feet. 2+ pedal pulses.  Abdomen: no tenderness, no masses palpated.  Bowel sounds positive.  Musculoskeletal: no clubbing / cyanosis. No joint deformity upper and lower extremities. Good ROM, no contractures. Normal muscle tone.  Skin: no rashes, lesions, ulcers. No induration Neurologic: CN 2-12 grossly  intact. Sensation intact,Strength 5/5 in all 4.  Psychiatric: Normal judgment and insight. Alert and oriented x 3. Normal mood.     Labs on Admission: I have personally reviewed following labs and imaging studies  CBC: Recent Labs  Lab 05/01/20 1201  WBC 10.5  HGB 11.9*  HCT 36.7  MCV 96.8  PLT 315   Basic Metabolic Panel: Recent Labs  Lab 05/01/20 1201  NA 145  K 3.7  CL 106  CO2 24  GLUCOSE 137*  BUN 18  CREATININE 1.04*  CALCIUM 9.4   GFR: Estimated Creatinine Clearance: 75.5 mL/min (A) (by C-G formula based on SCr of 1.04 mg/dL (H)). Liver Function Tests: No results for input(s): AST, ALT, ALKPHOS, BILITOT, PROT, ALBUMIN in the last 168 hours. No results for input(s): LIPASE, AMYLASE in the last 168 hours. No results for input(s): AMMONIA in the last 168 hours. Coagulation Profile: No results for input(s): INR, PROTIME in the last 168 hours. Cardiac Enzymes: No results for input(s): CKTOTAL, CKMB, CKMBINDEX, TROPONINI in the last 168 hours. BNP (last 3 results) No results for input(s): PROBNP in the last 8760 hours. HbA1C: No results for input(s): HGBA1C in the last 72 hours. CBG: No results for input(s): GLUCAP in the last 168 hours. Lipid Profile: No results for input(s): CHOL, HDL, LDLCALC, TRIG, CHOLHDL, LDLDIRECT in the last 72 hours. Thyroid Function Tests: No results for input(s): TSH, T4TOTAL, FREET4, T3FREE, THYROIDAB in the last 72 hours. Anemia Panel: No results for input(s): VITAMINB12, FOLATE, FERRITIN, TIBC, IRON, RETICCTPCT in the last 72 hours. Urine analysis:    Component Value Date/Time   COLORURINE YELLOW 04/04/2016 [place] 04/04/2016 1258   LABSPEC 1.011 04/04/2016 1258   PHURINE 5.5 04/04/2016 1258   GLUCOSEU NEGATIVE 04/04/2016 1258   HGBUR NEGATIVE 04/04/2016 1258   BILIRUBINUR NEGATIVE 04/04/2016 1258   BILIRUBINUR negative 05/08/2015 1540   KETONESUR NEGATIVE 04/04/2016 1258   PROTEINUR NEGATIVE 04/04/2016  1258   UROBILINOGEN 0.2 05/08/2015 1540   UROBILINOGEN 1.0 03/01/2015 2017   NITRITE NEGATIVE 04/04/2016 1258   LEUKOCYTESUR NEGATIVE 04/04/2016 1258    Radiological Exams on Admission: DG Chest 2 View  Result Date: 05/01/2020 CLINICAL DATA:  Shortness of breath over the last 5 days. History of hypertension. Coronavirus exposure. EXAM:  CHEST - 2 VIEW COMPARISON:  01/11/2014 FINDINGS: Mild cardiac enlargement. Hazy and patchy widespread bilateral pulmonary density. Small amount of fluid in the fissures. The appearance could be due to either congestive heart failure for viral pneumonia. CHF is favored. No significant bone finding. IMPRESSION: Widespread hazy and patchy pulmonary density. This could represent congestive heart failure or viral pneumonia. CHF is favored. Electronically Signed   By: Nelson Chimes M.D.   On: 05/01/2020 11:46      Assessment/Plan  Suspected new onset congestive heart failure Patient with classic symptoms of orthopnea, PND and lower extremity edema.  Also appears to have longstanding hypertension from medication noncompliance. Obtain echocardiogram-last one was in 2010 with a EF of 60 to 65% and grade 1 diastolic dysfunction Strict I's and O's Daily weights Daily IV 40 mg Lasix Potassium on lower end of normal at 3.7.  Will give supplementation.  Hypertension From medication noncompliance Received 5 mg of amlodipine in the ED. Will switch to 5mg  of lisinopril in the setting of probable CHF.Will also need to add on beta-blocker prior to discharge.  Elevated troponin Flat at 83 and 84.  Secondary to demand ischemia from new CHF.  AKI Creatinine of 1.04 from prior of 0.84 Likely from vascular congestion.  Follow with repeat BMP in the morning.  Morbid obesity BMI of 37  DVT prophylaxis:.Lovenox Code Status: Full Family Communication: Plan discussed with patient at bedside  disposition Plan: Home with at least 2 midnight stays  Consults called:   Admission status: inpatient  Status is: Inpatient  Remains inpatient appropriate because:Inpatient level of care appropriate due to severity of illness   Dispo: The patient is from: Home              Anticipated d/c is to: Home              Anticipated d/c date is: 2 days              Patient currently is not medically stable to d/c.         Orene Desanctis DO [facility]   If 7PM-7AM, please contact night-coverage [email or web address]   05/01/2020, 8:37 PM

## 2020-05-02 ENCOUNTER — Inpatient Hospital Stay (HOSPITAL_COMMUNITY): Payer: Medicare HMO

## 2020-05-02 DIAGNOSIS — I361 Nonrheumatic tricuspid (valve) insufficiency: Secondary | ICD-10-CM

## 2020-05-02 DIAGNOSIS — I161 Hypertensive emergency: Secondary | ICD-10-CM

## 2020-05-02 DIAGNOSIS — I5021 Acute systolic (congestive) heart failure: Secondary | ICD-10-CM

## 2020-05-02 DIAGNOSIS — I340 Nonrheumatic mitral (valve) insufficiency: Secondary | ICD-10-CM

## 2020-05-02 DIAGNOSIS — Z6837 Body mass index (BMI) 37.0-37.9, adult: Secondary | ICD-10-CM

## 2020-05-02 LAB — ECHOCARDIOGRAM COMPLETE
Area-P 1/2: 3.6 cm2
Height: 66 in
S' Lateral: 3.3 cm
Weight: 3760 oz

## 2020-05-02 LAB — BASIC METABOLIC PANEL
Anion gap: 12 (ref 5–15)
BUN: 18 mg/dL (ref 6–20)
CO2: 25 mmol/L (ref 22–32)
Calcium: 9 mg/dL (ref 8.9–10.3)
Chloride: 105 mmol/L (ref 98–111)
Creatinine, Ser: 0.94 mg/dL (ref 0.44–1.00)
GFR calc Af Amer: >60 mL/min (ref >60)
GFR calc non Af Amer: >60 mL/min (ref >60)
Glucose, Bld: 148 mg/dL — ABNORMAL HIGH (ref 70–99)
Potassium: 3.5 mmol/L (ref 3.5–5.1)
Sodium: 142 mmol/L (ref 135–145)

## 2020-05-02 MED ORDER — HYDRALAZINE HCL 25 MG PO TABS
25.0000 mg | ORAL_TABLET | Freq: Three times a day (TID) | ORAL | Status: DC
  Administered 2020-05-02: 25 mg via ORAL
  Filled 2020-05-02: qty 1

## 2020-05-02 MED ORDER — LOSARTAN POTASSIUM 25 MG PO TABS: 25.0000 mg | ORAL_TABLET | Freq: Every day | ORAL | Status: DC

## 2020-05-02 MED ORDER — QUETIAPINE FUMARATE 200 MG PO TABS
600.0000 mg | ORAL_TABLET | Freq: Every day | ORAL | Status: DC
  Administered 2020-05-02 – 2020-05-03 (×2): 600 mg via ORAL
  Filled 2020-05-02 (×2): qty 3

## 2020-05-02 MED ORDER — FUROSEMIDE 10 MG/ML IJ SOLN
40.0000 mg | Freq: Once | INTRAMUSCULAR | Status: AC
  Administered 2020-05-02: 40 mg via INTRAVENOUS
  Filled 2020-05-02: qty 4

## 2020-05-02 MED ORDER — HYDRALAZINE HCL 50 MG PO TABS
50.0000 mg | ORAL_TABLET | Freq: Three times a day (TID) | ORAL | Status: DC
  Administered 2020-05-02 – 2020-05-03 (×3): 50 mg via ORAL
  Filled 2020-05-02 (×3): qty 1

## 2020-05-02 MED ORDER — ALBUTEROL SULFATE (2.5 MG/3ML) 0.083% IN NEBU: 2.5000 mg | INHALATION_SOLUTION | Freq: Four times a day (QID) | RESPIRATORY_TRACT | Status: DC | PRN

## 2020-05-02 MED ORDER — ZOLPIDEM TARTRATE 5 MG PO TABS
5.0000 mg | ORAL_TABLET | Freq: Once | ORAL | Status: AC
  Administered 2020-05-02: 5 mg via ORAL
  Filled 2020-05-02: qty 1

## 2020-05-02 MED ORDER — SENNOSIDES-DOCUSATE SODIUM 8.6-50 MG PO TABS
1.0000 | ORAL_TABLET | Freq: Two times a day (BID) | ORAL | Status: DC
  Administered 2020-05-02: 1 via ORAL
  Filled 2020-05-02 (×2): qty 1

## 2020-05-02 MED ORDER — HYDRALAZINE HCL 10 MG PO TABS
10.0000 mg | ORAL_TABLET | Freq: Once | ORAL | Status: AC
  Administered 2020-05-02: 10 mg via ORAL
  Filled 2020-05-02: qty 1

## 2020-05-02 MED ORDER — FUROSEMIDE 10 MG/ML IJ SOLN
40.0000 mg | Freq: Two times a day (BID) | INTRAMUSCULAR | Status: DC
  Administered 2020-05-02 – 2020-05-04 (×4): 40 mg via INTRAVENOUS
  Filled 2020-05-02 (×4): qty 4

## 2020-05-02 MED ORDER — ZOLPIDEM TARTRATE 5 MG PO TABS: 5.0000 mg | ORAL_TABLET | Freq: Every evening | ORAL | Status: DC | PRN

## 2020-05-02 MED ORDER — POLYETHYLENE GLYCOL 3350 17 G PO PACK
17.0000 g | PACK | Freq: Every day | ORAL | Status: DC
  Administered 2020-05-02: 17 g via ORAL
  Filled 2020-05-02 (×2): qty 1

## 2020-05-02 MED ORDER — PANTOPRAZOLE SODIUM 40 MG IV SOLR
40.0000 mg | Freq: Two times a day (BID) | INTRAVENOUS | Status: DC
  Administered 2020-05-02 – 2020-05-03 (×4): 40 mg via INTRAVENOUS
  Filled 2020-05-02 (×4): qty 40

## 2020-05-02 MED ORDER — HYDROXYZINE HCL 10 MG PO TABS: 10.0000 mg | ORAL_TABLET | Freq: Three times a day (TID) | ORAL | Status: DC | PRN

## 2020-05-02 MED ORDER — MELATONIN 3 MG PO TABS
3.0000 mg | ORAL_TABLET | Freq: Every day | ORAL | Status: DC
  Administered 2020-05-03: 3 mg via ORAL
  Filled 2020-05-02: qty 1

## 2020-05-02 MED ORDER — DICYCLOMINE HCL 10 MG PO CAPS
10.0000 mg | ORAL_CAPSULE | Freq: Three times a day (TID) | ORAL | Status: DC | PRN
  Administered 2020-05-02: 10 mg via ORAL
  Filled 2020-05-02: qty 1

## 2020-05-02 MED ORDER — ISOSORBIDE MONONITRATE ER 30 MG PO TB24
15.0000 mg | ORAL_TABLET | Freq: Every day | ORAL | Status: DC
  Administered 2020-05-02 – 2020-05-03 (×2): 15 mg via ORAL
  Filled 2020-05-02 (×2): qty 1

## 2020-05-02 NOTE — Progress Notes (Signed)
Spoke with pt regarding cpap.  Pt stated she was told by her primary physician that she has osa, but has never had a sleep study done and does not have a cpap machine at home.  Pt states she is willing to try it if she sleeps tonight.  Pt prefers to call if/when she is ready for bed.  RN aware and will call if cpap needed.

## 2020-05-02 NOTE — Plan of Care (Signed)

## 2020-05-02 NOTE — Progress Notes (Signed)
PROGRESS NOTE    Dorothy Alvarez  TDD:[id number] DOB: [birth year] DOA: 05/01/2020 PCP: Nolene Ebbs, MD    Chief Complaint  Patient presents with  . Shortness of Breath    Brief Narrative:  Chief Complaint: Increasing shortness of breath  HPI: Dorothy Alvarez is a 54 y.o. female with medical history significant for hypertension and depression who presents with concerns of increasing shortness of breath.  For the past 3 weeks, patient reports that her husband was telling her that it did not seem like she was breathing at night in her sleep.  She herself has woken up out of bed gasping for air.  She is having to sit up and is afraid that if she lay flat and go to sleep she would not wake up.  She also progressively has felt increasing dyspnea with exertion and at rest.  Denies any coughing or fevers.  No chest pain but sometimes feel like her heart is racing.  Over the last 3 days she is also noticed increased lower extremity edema around her ankles.  No history of recent surgery. Patient has history of hypertension and was noted to have been prescribed amlodipine, HCTZ and an ARB in the past but admits that she has not been compliant with these medications. She denies any tobacco, alcohol or illicit drug use.  No family history of cardiac issues.  ED Course: She was afebrile, tachycardic and hypertensive up to systolic of 623J over 628B on room air. Lab work notable for BNP of 1560.  Troponin flat at 83 and 84.  EKG shows nonspecific T wave changes to lateral leads. CBC showed no leukocytosis, has mild anemia with hemoglobin of 11.9.  Blood glucose of 137.  Creatinine mildly elevated 1.04.  Chest x-ray showing hazy and patchy pulmonary density favoring likely CHF.  Subjective:  Denies chest pain, continue to reports sob, no cough, no fever, good urine output,  Edema has improved, bp remain elevated  Assessment & Plan:   Active Problems:   Essential hypertension   Acute CHF (congestive  heart failure) (HCC)   AKI (acute kidney injury) (HCC)   BMI 37.0-37.9, adult  Acute heart failure with pulmonary edema Increase Lasix from 40mg  daily to 40 mg twice daily Echo pending Strict intake and output Start cpap qhs  Uncontrolled hypertension with hypertension emergency Report noncompliant with medication She received 2 dose of lisinopril since hospitalization, reviewed chart she is on ARB's at home, will DC lisinopril, change to losartan Start Imdur and hydralazine increase IV Lasix  obesity Body mass index is 37.93 kg/m.   DVT prophylaxis: lovenox   Code Status:full Family Communication: patient Disposition:   Status is: Inpatient  Dispo: The patient is from: home              Anticipated d/c is to: TBD              Anticipated d/c date is: TBD, not medically stable               Consultants:   none  Procedures:   none  Antimicrobials:   none     Objective: Vitals:   05/01/20 1655 05/01/20 1735 05/01/20 1800 05/02/20 0316  BP:  (!) 181/134 (!) 186/147 (!) 164/102  Pulse:   (!) 103 76  Resp:   (!) 29 19  Temp:      TempSrc:      SpO2:   93% 95%  Weight: 106.6 kg     Height: 5\' 6"  (  1.676 m)      No intake or output data in the 24 hours ending 05/02/20 0728 Filed Weights   05/01/20 1655  Weight: 106.6 kg    Examination:  General exam: calm, NAD Respiratory system: Bibasilar crackles, no wheezing, no rhonchi.  Tachypneic respiration rate ranged from 24-35 Cardiovascular system: S1 & S2 heard, tachycardia Gastrointestinal system: Abdomen is nondistended, soft and nontender. No organomegaly or masses felt. Normal bowel sounds heard. Central nervous system: Alert and oriented. No focal neurological deficits. Extremities: Symmetric 5 x 5 power.  Trace ankle edema, has improved Skin: No rashes, lesions or ulcers Psychiatry: Judgement and insight appear normal. Mood & affect appropriate.     Data Reviewed: I have personally reviewed  following labs and imaging studies  CBC: Recent Labs  Lab 05/01/20 1201  WBC 10.5  HGB 11.9*  HCT 36.7  MCV 96.8  PLT 833    Basic Metabolic Panel: Recent Labs  Lab 05/01/20 1201 05/02/20 0600  NA 145 142  K 3.7 3.5  CL 106 105  CO2 24 25  GLUCOSE 137* 148*  BUN 18 18  CREATININE 1.04* 0.94  CALCIUM 9.4 9.0    GFR: Estimated Creatinine Clearance: 83.5 mL/min (by C-G formula based on SCr of 0.94 mg/dL).  Liver Function Tests: No results for input(s): AST, ALT, ALKPHOS, BILITOT, PROT, ALBUMIN in the last 168 hours.  CBG: No results for input(s): GLUCAP in the last 168 hours.   Recent Results (from the past 240 hour(s))  SARS Coronavirus 2 by RT PCR (hospital order, performed in [facility] hospital lab) Nasopharyngeal Nasopharyngeal Swab     Status: None   Collection Time: 05/01/20  4:30 PM   Specimen: Nasopharyngeal Swab  Result Value Ref Range Status   SARS Coronavirus 2 NEGATIVE NEGATIVE Final    Comment: (NOTE) SARS-CoV-2 target nucleic acids are NOT DETECTED.  The SARS-CoV-2 RNA is generally detectable in upper and lower respiratory specimens during the acute phase of infection. The lowest concentration of SARS-CoV-2 viral copies this assay can detect is 250 copies / mL. A negative result does not preclude SARS-CoV-2 infection and should not be used as the sole basis for treatment or other patient management decisions.  A negative result may occur with improper specimen collection / handling, submission of specimen other than nasopharyngeal swab, presence of viral mutation(s) within the areas targeted by this assay, and inadequate number of viral copies (<250 copies / mL). A negative result must be combined with clinical observations, patient history, and epidemiological information.  Fact Sheet for Patients:   StrictlyIdeas.no  Fact Sheet for Healthcare Providers: BankingDealers.co.za  This test is not  yet approved or  cleared by the [place] FDA and has been authorized for detection and/or diagnosis of SARS-CoV-2 by FDA under an Emergency Use Authorization (EUA).  This EUA will remain in effect (meaning this test can be used) for the duration of the COVID-19 declaration under Section 564(b)(1) of the Act, 21 U.S.C. section 360bbb-3(b)(1), unless the authorization is terminated or revoked sooner.  Performed at [facility], [place] [address] [place], [place] [address]          Radiology Studies: DG Chest 2 View  Result Date: 05/01/2020 CLINICAL DATA:  Shortness of breath over the last 5 days. History of hypertension. Coronavirus exposure. EXAM: CHEST - 2 VIEW COMPARISON:  01/11/2014 FINDINGS: Mild cardiac enlargement. Hazy and patchy widespread bilateral pulmonary density. Small amount of fluid in the fissures. The appearance could be  due to either congestive heart failure for viral pneumonia. CHF is favored. No significant bone finding. IMPRESSION: Widespread hazy and patchy pulmonary density. This could represent congestive heart failure or viral pneumonia. CHF is favored. Electronically Signed   By: Nelson Chimes M.D.   On: 05/01/2020 11:46        Scheduled Meds: . enoxaparin (LOVENOX) injection  0.5 mg/kg Subcutaneous Q24H  . furosemide  40 mg Intravenous Daily  . lisinopril  5 mg Oral Daily  . sodium chloride flush  3 mL Intravenous Q12H   Continuous Infusions: . sodium chloride       LOS: 1 day   Time spent: 62mins Greater than 50% of this time was spent in counseling, explanation of diagnosis, planning of further management, and coordination of care.  I have personally reviewed and interpreted on  05/02/2020 daily labs, tele strips, imagings as discussed above under date review session and assessment and plans.  I reviewed all nursing notes, pharmacy notes, consultant notes,  vitals, pertinent old records  I have discussed plan of care as  described above with RN , patient on 05/02/2020  Voice Recognition /Dragon dictation system was used to create this note, attempts have been made to correct errors. Please contact the author with questions and/or clarifications.   Florencia Reasons, MD PhD FACP [facility]  Available via Epic secure chat 7am-7pm for nonurgent issues Please page for urgent issues To page the attending provider between 7A-7P or the covering provider during after hours 7P-7A, please log into the web site [email or web address] and access using universal [place] password for that web site. If you do not have the password, please call the hospital operator.    05/02/2020, 7:28 AM

## 2020-05-02 NOTE — ED Notes (Signed)
Pt in room A&O x4. C/o back pain at this time. audiable wheezing heard. MD notified.

## 2020-05-02 NOTE — ED Notes (Signed)
Patient said that blood pressure cuff hurt her arm too much to check blood pressure at 0822 hrs.

## 2020-05-02 NOTE — Progress Notes (Signed)
  Echocardiogram 2D Echocardiogram has been performed.  Dorothy Alvarez 05/02/2020, 4:04 PM

## 2020-05-03 ENCOUNTER — Inpatient Hospital Stay (HOSPITAL_COMMUNITY): Payer: Medicare HMO

## 2020-05-03 DIAGNOSIS — I10 Essential (primary) hypertension: Secondary | ICD-10-CM

## 2020-05-03 DIAGNOSIS — N179 Acute kidney failure, unspecified: Secondary | ICD-10-CM

## 2020-05-03 DIAGNOSIS — I5041 Acute combined systolic (congestive) and diastolic (congestive) heart failure: Secondary | ICD-10-CM

## 2020-05-03 LAB — CBC WITH DIFFERENTIAL/PLATELET
Abs Immature Granulocytes: 0.05 10*3/uL (ref 0.00–0.07)
Basophils Absolute: 0.1 10*3/uL (ref 0.0–0.1)
Basophils Relative: 1 %
Eosinophils Absolute: 0.4 10*3/uL (ref 0.0–0.5)
Eosinophils Relative: 5 %
HCT: 40.6 % (ref 36.0–46.0)
Hemoglobin: 13.3 g/dL (ref 12.0–15.0)
Immature Granulocytes: 1 %
Lymphocytes Relative: 32 %
Lymphs Abs: 2.9 10*3/uL (ref 0.7–4.0)
MCH: 31.4 pg (ref 26.0–34.0)
MCHC: 32.8 g/dL (ref 30.0–36.0)
MCV: 95.8 fL (ref 80.0–100.0)
Monocytes Absolute: 0.6 10*3/uL (ref 0.1–1.0)
Monocytes Relative: 7 %
Neutro Abs: 5.1 10*3/uL (ref 1.7–7.7)
Neutrophils Relative %: 54 %
Platelets: 303 10*3/uL (ref 150–400)
RBC: 4.24 MIL/uL (ref 3.87–5.11)
RDW: 15.1 % (ref 11.5–15.5)
WBC: 9.2 10*3/uL (ref 4.0–10.5)
nRBC: 0 % (ref 0.0–0.2)

## 2020-05-03 LAB — RAPID URINE DRUG SCREEN, HOSP PERFORMED
Amphetamines: NOT DETECTED
Barbiturates: NOT DETECTED
Benzodiazepines: NOT DETECTED
Cocaine: NOT DETECTED
Opiates: POSITIVE — AB
Tetrahydrocannabinol: NOT DETECTED

## 2020-05-03 LAB — COMPREHENSIVE METABOLIC PANEL
ALT: 102 U/L — ABNORMAL HIGH (ref 0–44)
AST: 58 U/L — ABNORMAL HIGH (ref 15–41)
Albumin: 3.9 g/dL (ref 3.5–5.0)
Alkaline Phosphatase: 83 U/L (ref 38–126)
Anion gap: 13 (ref 5–15)
BUN: 16 mg/dL (ref 6–20)
CO2: 28 mmol/L (ref 22–32)
Calcium: 8.6 mg/dL — ABNORMAL LOW (ref 8.9–10.3)
Chloride: 100 mmol/L (ref 98–111)
Creatinine, Ser: 1.02 mg/dL — ABNORMAL HIGH (ref 0.44–1.00)
GFR calc Af Amer: >60 mL/min (ref >60)
GFR calc non Af Amer: >60 mL/min (ref >60)
Glucose, Bld: 118 mg/dL — ABNORMAL HIGH (ref 70–99)
Potassium: 2.6 mmol/L — CL (ref 3.5–5.1)
Sodium: 141 mmol/L (ref 135–145)
Total Bilirubin: 2.3 mg/dL — ABNORMAL HIGH (ref 0.3–1.2)
Total Protein: 6.5 g/dL (ref 6.5–8.1)

## 2020-05-03 LAB — HEPATIC FUNCTION PANEL
ALT: 107 U/L — ABNORMAL HIGH (ref 0–44)
AST: 99 U/L — ABNORMAL HIGH (ref 15–41)
Albumin: 4.2 g/dL (ref 3.5–5.0)
Alkaline Phosphatase: 93 U/L (ref 38–126)
Bilirubin, Direct: 0.8 mg/dL — ABNORMAL HIGH (ref 0.0–0.2)
Indirect Bilirubin: 1 mg/dL — ABNORMAL HIGH (ref 0.3–0.9)
Total Bilirubin: 1.8 mg/dL — ABNORMAL HIGH (ref 0.3–1.2)
Total Protein: 7.1 g/dL (ref 6.5–8.1)

## 2020-05-03 LAB — LACTIC ACID, PLASMA: Lactic Acid, Venous: 1.2 mmol/L (ref 0.5–1.9)

## 2020-05-03 LAB — LIPASE, BLOOD: Lipase: 33 U/L (ref 11–51)

## 2020-05-03 MED ORDER — SENNOSIDES-DOCUSATE SODIUM 8.6-50 MG PO TABS: 1.0000 | ORAL_TABLET | Freq: Every day | ORAL | Status: DC

## 2020-05-03 MED ORDER — HYDRALAZINE HCL 10 MG PO TABS
10.0000 mg | ORAL_TABLET | Freq: Once | ORAL | Status: AC
  Administered 2020-05-03: 10 mg via ORAL
  Filled 2020-05-03: qty 1

## 2020-05-03 MED ORDER — POTASSIUM CHLORIDE CRYS ER 20 MEQ PO TBCR
40.0000 meq | EXTENDED_RELEASE_TABLET | Freq: Once | ORAL | Status: AC
  Administered 2020-05-03: 40 meq via ORAL
  Filled 2020-05-03: qty 2

## 2020-05-03 MED ORDER — SACUBITRIL-VALSARTAN 24-26 MG PO TABS: 1.0000 | ORAL_TABLET | Freq: Two times a day (BID) | ORAL | Status: DC

## 2020-05-03 MED ORDER — CARVEDILOL 3.125 MG PO TABS
3.1250 mg | ORAL_TABLET | Freq: Two times a day (BID) | ORAL | Status: DC
  Administered 2020-05-03 – 2020-05-04 (×2): 3.125 mg via ORAL
  Filled 2020-05-03 (×2): qty 1

## 2020-05-03 MED ORDER — POTASSIUM CHLORIDE CRYS ER 20 MEQ PO TBCR
40.0000 meq | EXTENDED_RELEASE_TABLET | Freq: Two times a day (BID) | ORAL | Status: DC
  Administered 2020-05-03 – 2020-05-04 (×3): 40 meq via ORAL
  Filled 2020-05-03 (×2): qty 2

## 2020-05-03 MED ORDER — HYDRALAZINE HCL 50 MG PO TABS
75.0000 mg | ORAL_TABLET | Freq: Three times a day (TID) | ORAL | Status: DC
  Administered 2020-05-03 – 2020-05-04 (×2): 75 mg via ORAL
  Filled 2020-05-03 (×2): qty 1

## 2020-05-03 MED ORDER — LOSARTAN POTASSIUM 50 MG PO TABS
50.0000 mg | ORAL_TABLET | Freq: Every day | ORAL | Status: DC
  Administered 2020-05-03: 50 mg via ORAL
  Filled 2020-05-03: qty 1

## 2020-05-03 MED ORDER — POTASSIUM CHLORIDE CRYS ER 20 MEQ PO TBCR: 40.0000 meq | EXTENDED_RELEASE_TABLET | Freq: Two times a day (BID) | ORAL | Status: DC

## 2020-05-03 MED ORDER — POTASSIUM CHLORIDE 10 MEQ/100ML IV SOLN
10.0000 meq | INTRAVENOUS | Status: DC
  Administered 2020-05-03: 10 meq via INTRAVENOUS
  Filled 2020-05-03: qty 100

## 2020-05-03 MED ORDER — POTASSIUM CHLORIDE CRYS ER 20 MEQ PO TBCR: 30.0000 meq | EXTENDED_RELEASE_TABLET | Freq: Two times a day (BID) | ORAL | Status: DC

## 2020-05-03 MED ORDER — ENOXAPARIN SODIUM 60 MG/0.6ML ~~LOC~~ SOLN
50.0000 mg | SUBCUTANEOUS | Status: DC
  Filled 2020-05-03: qty 0.6

## 2020-05-03 MED ORDER — SACUBITRIL-VALSARTAN 24-26 MG PO TABS
1.0000 | ORAL_TABLET | Freq: Two times a day (BID) | ORAL | Status: DC
  Filled 2020-05-03: qty 1

## 2020-05-03 MED ORDER — SODIUM CHLORIDE 0.9% FLUSH
3.0000 mL | Freq: Two times a day (BID) | INTRAVENOUS | Status: DC
  Administered 2020-05-03 (×2): 3 mL via INTRAVENOUS

## 2020-05-03 NOTE — Progress Notes (Signed)
PROGRESS NOTE    Dorothy Alvarez  XBM:[id number] DOB: 15-Jun-1966 DOA: 05/01/2020 PCP: Nolene Ebbs, MD    Chief Complaint  Patient presents with  . Shortness of Breath    Brief Narrative:  Chief Complaint: Increasing shortness of breath  HPI: Dorothy Alvarez is a 54 y.o. female with medical history significant for hypertension and depression who presents with concerns of increasing shortness of breath.  For the past 3 weeks, patient reports that her husband was telling her that it did not seem like she was breathing at night in her sleep.  She herself has woken up out of bed gasping for air.  She is having to sit up and is afraid that if she lay flat and go to sleep she would not wake up.  She also progressively has felt increasing dyspnea with exertion and at rest.  Denies any coughing or fevers.  No chest pain but sometimes feel like her heart is racing.  Over the last 3 days she is also noticed increased lower extremity edema around her ankles.  No history of recent surgery. Patient has history of hypertension and was noted to have been prescribed amlodipine, HCTZ and an ARB in the past but admits that she has not been compliant with these medications. She denies any tobacco, alcohol or illicit drug use.  No family history of cardiac issues.  ED Course: She was afebrile, tachycardic and hypertensive up to systolic of 027O over 536U on room air. Lab work notable for BNP of 1560.  Troponin flat at 83 and 84.  EKG shows nonspecific T wave changes to lateral leads. CBC showed no leukocytosis, has mild anemia with hemoglobin of 11.9.  Blood glucose of 137.  Creatinine mildly elevated 1.04.  Chest x-ray showing hazy and patchy pulmonary density favoring likely CHF.  Subjective:  Denies chest pain, continue to reports sob, no cough, no fever, good urine output,  Edema has improved, bp remains elevated 1.6 L urine output documented last 24 hours  Assessment & Plan:   Active Problems:    Essential hypertension   Acute CHF (congestive heart failure) (HCC)   AKI (acute kidney injury) (HCC)   BMI 37.0-37.9, adult  Acute heart failure with pulmonary edema On Lasix from 40mg  daily to 40 mg twice daily is a good urine output, creatinine stable Echo "a reduced LV EF at 25 to 30% with global hypokinesis, mild LVH, grade 3 restrictive diastolic dysfunction, mildly elevated PA systolic pressures, and mild MR." Strict intake and output Start cpap qhs Cardiology consulted, plan to do right and left heart cath tomorrow, she is to be n.p.o. after midnight tonight ,cardiology input appreciated  Hypokalemia, replace K, check mag  Elevated LFT, likely from liver congestion in the setting of acute heart failure We will check hepatitis panel, abdominal ultrasound  Uncontrolled hypertension with hypertension emergency Report noncompliant with medication She received 2 dose of lisinopril on 914 and 9/15, then on losartan Start Imdur and hydralazine started on 9/16 Cardiology started on coreg  IV Lasix Check UDS  Obesity, reports h/o OSA but not on home cpap Body mass index is 36.67 kg/m. cpap ordered here  DVT prophylaxis: lovenox   Code Status:full Family Communication: patient Disposition:   Status is: Inpatient  Dispo: The patient is from: home              Anticipated d/c is to: TBD              Anticipated d/c date is: Pending  cardiac cath result, needs cardiology clearance               Consultants:   Cardiology  Procedures:   Right and left heart cath planned on September 17  Antimicrobials:   none     Objective: Vitals:   05/03/20 0548 05/03/20 1020 05/03/20 1022 05/03/20 1239  BP: (!) 147/86 (!) 151/91  (!) 147/105  Pulse: (!) 52 (!) 104  (!) 102  Resp: 19 (!) 28  (!) 27  Temp: 98.2 F (36.8 C) 98.2 F (36.8 C)  98.4 F (36.9 C)  TempSrc: Oral Oral  Oral  SpO2: 97% 95%  99%  Weight:   103.1 kg   Height:        Intake/Output Summary (Last  24 hours) at 05/03/2020 1521 Last data filed at 05/03/2020 1011 Gross per 24 hour  Intake 900 ml  Output 1651 ml  Net -751 ml   Filed Weights   05/01/20 1655 05/03/20 1022  Weight: 106.6 kg 103.1 kg    Examination:  General exam: calm, NAD Respiratory system: Bibasilar crackles, no wheezing, no rhonchi.  she remains tachypneic ,respiration rate ranged from 28-33 Cardiovascular system: S1 & S2 heard, tachycardia Gastrointestinal system: Abdomen is nondistended, soft and nontender. No organomegaly or masses felt. Normal bowel sounds heard. Central nervous system: Alert and oriented. No focal neurological deficits. Extremities: Symmetric 5 x 5 power.  Trace ankle edema, has improved Skin: No rashes, lesions or ulcers Psychiatry: Judgement and insight appear normal. Mood & affect appropriate.     Data Reviewed: I have personally reviewed following labs and imaging studies  CBC: Recent Labs  Lab 05/01/20 1201 05/03/20 0518  WBC 10.5 9.2  NEUTROABS  --  5.1  HGB 11.9* 13.3  HCT 36.7 40.6  MCV 96.8 95.8  PLT 296 846    Basic Metabolic Panel: Recent Labs  Lab 05/01/20 1201 05/02/20 0600 05/03/20 0518  NA 145 142 141  K 3.7 3.5 2.6*  CL 106 105 100  CO2 24 25 28   GLUCOSE 137* 148* 118*  BUN 18 18 16   CREATININE 1.04* 0.94 1.02*  CALCIUM 9.4 9.0 8.6*    GFR: Estimated Creatinine Clearance: 75.6 mL/min (A) (by C-G formula based on SCr of 1.02 mg/dL (H)).  Liver Function Tests: Recent Labs  Lab 05/03/20 0518  AST 58*  ALT 102*  ALKPHOS 83  BILITOT 2.3*  PROT 6.5  ALBUMIN 3.9    CBG: No results for input(s): GLUCAP in the last 168 hours.   Recent Results (from the past 240 hour(s))  SARS Coronavirus 2 by RT PCR (hospital order, performed in [facility] hospital lab) Nasopharyngeal Nasopharyngeal Swab     Status: None   Collection Time: 05/01/20  4:30 PM   Specimen: Nasopharyngeal Swab  Result Value Ref Range Status   SARS Coronavirus 2 NEGATIVE  NEGATIVE Final    Comment: (NOTE) SARS-CoV-2 target nucleic acids are NOT DETECTED.  The SARS-CoV-2 RNA is generally detectable in upper and lower respiratory specimens during the acute phase of infection. The lowest concentration of SARS-CoV-2 viral copies this assay can detect is 250 copies / mL. A negative result does not preclude SARS-CoV-2 infection and should not be used as the sole basis for treatment or other patient management decisions.  A negative result may occur with improper specimen collection / handling, submission of specimen other than nasopharyngeal swab, presence of viral mutation(s) within the areas targeted by this assay, and inadequate number of viral copies (<250  copies / mL). A negative result must be combined with clinical observations, patient history, and epidemiological information.  Fact Sheet for Patients:   StrictlyIdeas.no  Fact Sheet for Healthcare Providers: BankingDealers.co.za  This test is not yet approved or  cleared by the [place] FDA and has been authorized for detection and/or diagnosis of SARS-CoV-2 by FDA under an Emergency Use Authorization (EUA).  This EUA will remain in effect (meaning this test can be used) for the duration of the COVID-19 declaration under Section 564(b)(1) of the Act, 21 U.S.C. section 360bbb-3(b)(1), unless the authorization is terminated or revoked sooner.  Performed at [facility], [place] [address], [place], [place] [address]          Radiology Studies: DG Abd 1 View  Result Date: 05/02/2020 CLINICAL DATA:  Shortness of breath for 5 days EXAM: ABDOMEN - 1 VIEW COMPARISON:  Acute abdominal series 03/01/2015 FINDINGS: Moderate colonic stool burden with air-filled appearance of the transverse colon with some questionable fold thickening. No high-grade obstructive bowel gas pattern is seen. Few phleboliths noted over the pelvis. Without  concerning calcifications over the included urinary tract or visible portions of the gallbladder fossa. Mild degenerative changes in the spine, hips and pelvis. IMPRESSION: 1. Moderate colonic stool burden 2. Air-filled appearance of the transverse colon with some questionable fold thickening, could reflect some nonspecific colitis. Correlate with symptoms. 3. No high-grade obstructive bowel gas pattern. Electronically Signed   By: Lovena Le M.D.   On: 05/02/2020 15:42   ECHOCARDIOGRAM COMPLETE  Result Date: 05/02/2020    ECHOCARDIOGRAM REPORT   Patient Name:   MAZI Brodowski Date of Exam: 05/02/2020 Medical Rec #:  [id number]   Height:       66.0 in Accession #:    [id number]  Weight:       235.0 lb Date of Birth:  [birth year]   BSA:          2.142 m Patient Age:    37 years    BP:           189/134 mmHg [name]           HR:           112 bpm. Exam Location:  Inpatient Procedure: 2D Echo Indications:    786.09 dyspnea  History:        Patient has no prior history of Echocardiogram examinations.                 Risk Factors:Hypertension.  Sonographer:    Jannett Celestine RDCS (AE) Referring Phys: [id number] [place] T TU IMPRESSIONS  1. Left ventricular ejection fraction, by estimation, is 25 to 30%. The left ventricle has severely decreased function. The left ventricle demonstrates global hypokinesis. There is mild left ventricular hypertrophy. Left ventricular diastolic parameters  are consistent with Grade III diastolic dysfunction (restrictive).  2. Right ventricular systolic function is normal. The right ventricular size is normal. There is mildly elevated pulmonary artery systolic pressure.  3. The mitral valve is normal in structure. Mild mitral valve regurgitation.  4. The aortic valve is normal in structure. Aortic valve regurgitation is not visualized. FINDINGS  Left Ventricle: Left ventricular ejection fraction, by estimation, is 25 to 30%. The left ventricle has severely decreased function. The left  ventricle demonstrates global hypokinesis. The left ventricular internal cavity size was normal in size. There is mild left ventricular hypertrophy. Left ventricular diastolic parameters are consistent with Grade III diastolic dysfunction (restrictive). Right Ventricle:  The right ventricular size is normal. No increase in right ventricular wall thickness. Right ventricular systolic function is normal. There is mildly elevated pulmonary artery systolic pressure. Left Atrium: Left atrial size was normal in size. Right Atrium: Right atrial size was normal in size. Pericardium: There is no evidence of pericardial effusion. Mitral Valve: The mitral valve is normal in structure. Mild mitral valve regurgitation. Tricuspid Valve: The tricuspid valve is normal in structure. Tricuspid valve regurgitation is mild. Aortic Valve: The aortic valve is normal in structure. Aortic valve regurgitation is not visualized. Pulmonic Valve: The pulmonic valve was normal in structure. Pulmonic valve regurgitation is not visualized. Aorta: The aortic root and ascending aorta are structurally normal, with no evidence of dilitation. IAS/Shunts: The atrial septum is grossly normal.  LEFT VENTRICLE PLAX 2D LVIDd:         5.20 cm LVIDs:         3.30 cm LV PW:         1.30 cm LV IVS:        1.10 cm LVOT diam:     2.10 cm LV SV:         32 LV SV Index:   15 LVOT Area:     3.46 cm  LEFT ATRIUM         Index LA diam:    4.20 cm 1.96 cm/m  AORTIC VALVE LVOT Vmax:   68.50 cm/s LVOT Vmean:  50.600 cm/s LVOT VTI:    0.092 m  AORTA Ao Root diam: 3.30 cm MITRAL VALVE               TRICUSPID VALVE MV Area (PHT): 3.60 cm    TR Peak grad:   37.0 mmHg MV Decel Time: 211 msec    TR Vmax:        304.00 cm/s MV E velocity: 99.40 cm/s                            SHUNTS                            Systemic VTI:  0.09 m                            Systemic Diam: 2.10 cm Mertie Moores MD Electronically signed by Mertie Moores MD Signature Date/Time:  05/02/2020/5:38:08 PM    Final         Scheduled Meds: . carvedilol  3.125 mg Oral BID WC  . enoxaparin (LOVENOX) injection  50 mg Subcutaneous Q24H  . furosemide  40 mg Intravenous BID  . hydrALAZINE  50 mg Oral Q8H  . isosorbide mononitrate  15 mg Oral Daily  . losartan  50 mg Oral Daily  . melatonin  3 mg Oral QHS  . pantoprazole (PROTONIX) IV  40 mg Intravenous Q12H  . polyethylene glycol  17 g Oral Daily  . potassium chloride  40 mEq Oral BID  . potassium chloride  40 mEq Oral Once  . QUEtiapine  600 mg Oral QHS  . senna-docusate  1 tablet Oral BID  . sodium chloride flush  3 mL Intravenous Q12H  . sodium chloride flush  3 mL Intravenous Q12H   Continuous Infusions: . sodium chloride       LOS: 2 days   Time spent: 59mins Greater than 50% of this time was  spent in counseling, explanation of diagnosis, planning of further management, and coordination of care.  I have personally reviewed and interpreted on  05/03/2020 daily labs, tele strips, imagings as discussed above under date review session and assessment and plans.  I reviewed all nursing notes, pharmacy notes, consultant notes,  vitals, pertinent old records  I have discussed plan of care as described above with RN , patient on 05/03/2020  Voice Recognition /Dragon dictation system was used to create this note, attempts have been made to correct errors. Please contact the author with questions and/or clarifications.   Florencia Reasons, MD PhD FACP [facility]  Available via Epic secure chat 7am-7pm for nonurgent issues Please page for urgent issues To page the attending provider between 7A-7P or the covering provider during after hours 7P-7A, please log into the web site [email or web address] and access using universal [place] password for that web site. If you do not have the password, please call the hospital operator.    05/03/2020, 3:21 PM

## 2020-05-03 NOTE — H&P (View-Only) (Signed)
Cardiology Consultation:   Patient ID: Dorothy Alvarez; [id number]; Sep 14, 1965   Admit date: 05/01/2020 Date of Consult: 05/03/2020  Primary Care Provider: Nolene Ebbs, MD Primary Cardiologist: New to [facility]   Patient Profile:   Dorothy Alvarez is a 54 y.o. female with a hx of hypertension and depression who is being seen today for the evaluation of new systolic dysfunction at the request of Dr. Erlinda Hong.  History of Present Illness:   Dorothy Alvarez is a 54 year old female with a history stated above who presented to [facility] 05/01/2020 with a 3-week history of dyspnea on exertion, orthopnea/sleep apnea and lower extremity edema symptoms.  She reports her symptoms have been ongoing for several months however have worsened over the last several weeks as above.  She denies chest pain or other anginal symptoms. She has a history of hypertension and was previously prescribed amlodipine, HCTZ and ARB however states she has not taken these regularly for over a year. She denies tobacco, alcohol or illicit drug use.  She has no personal or family history of CAD.  In the ED, she was found to be markedly hypertensive with systolic blood pressures in the 190 range.  BNP elevated at 1560. hsT found to be elevated however flat at 83>> 84.  EKG with no acute ST segment changes.  Creatinine was stable at 1.04.  CXR consistent with CHF with hazy and patchy pulmonary densities.  She was started on IV Lasix 40 mg daily.  She was remotely seen by Dr. Johnsie Cancel and underwent an echocardiogram which showed an LVEF at 60 to 65% with grade 1 DD.  Repeat echocardiogram performed 05/02/2020 with a reduced LV EF at 25 to 30% with global hypokinesis, mild LVH, grade 3 restrictive diastolic dysfunction, mildly elevated PA systolic pressures, and mild MR.  Past Medical History:  Diagnosis Date  . Allergy   . Anxiety   . Arthritis   . Chronic abdominal pain   . Constipation   . Depression   . Hypertension   . IBS (irritable bowel  syndrome)   . Learning disability   . Neuromuscular disorder [facility])     Past Surgical History:  Procedure Laterality Date  . ABDOMINAL HYSTERECTOMY    . GALLBLADDER SURGERY    . KNEE SURGERY       Prior to Admission medications   Medication Sig Start Date End Date Taking? Authorizing Provider  amLODipine (NORVASC) 10 MG tablet Take 1 tablet (10 mg total) by mouth daily. 07/01/19  Yes Couture, Cortni S, PA-C  chlorproMAZINE (THORAZINE) 100 MG tablet Take 1 tablet (100 mg total) by mouth at bedtime. 07/03/19  Yes Merlyn Lot E, NP  gabapentin (NEURONTIN) 100 MG capsule Take 100 mg by mouth 3 (three) times daily. 02/07/20  Yes [provider]  PARoxetine (PAXIL) 10 MG tablet Take 10 mg by mouth daily.   Yes [provider]  QUEtiapine (SEROQUEL) 300 MG tablet Take 600 mg by mouth at bedtime. 03/17/20  Yes [provider]  tetrahydrozoline 0.05 % ophthalmic solution Place 1 drop into both eyes 3 (three) times daily as needed (Eye irritation). 10/22/18  Yes Connye Burkitt, NP  cyclobenzaprine (FLEXERIL) 10 MG tablet Take 1 tablet (10 mg total) by mouth 3 (three) times daily as needed for muscle spasms. Patient not taking: Reported on 07/02/2019 03/26/19   Drenda Freeze, MD  fluticasone [facility]) 50 MCG/ACT nasal spray Place 1 spray into both nostrils daily. Patient not taking: Reported on 07/02/2019 10/23/18  Connye Burkitt, NP  furosemide (LASIX) 40 MG tablet TAKE 1 TABLET BY MOUTH EVERY DAY Patient not taking: Reported on 05/01/2020 10/05/15   Ivar Drape D, PA  hydrochlorothiazide (HYDRODIURIL) 12.5 MG tablet Take 1 tablet (12.5 mg total) by mouth daily. Patient not taking: Reported on 05/01/2020 07/01/19 07/31/19  Couture, Cortni S, PA-C  hydrOXYzine (ATARAX/VISTARIL) 25 MG tablet Take 1 tablet (25 mg total) by mouth every 6 (six) hours as needed for anxiety. Patient not taking: Reported on 07/02/2019 10/22/18   Connye Burkitt, NP  irbesartan (AVAPRO) 75 MG  tablet Take 1 tablet (75 mg total) by mouth daily. For high blood pressure Patient not taking: Reported on 07/02/2019 03/26/19   Drenda Freeze, MD  loratadine (CLARITIN) 10 MG tablet Take 1 tablet (10 mg total) by mouth daily. Patient not taking: Reported on 07/02/2019 10/23/18   Connye Burkitt, NP  meloxicam (MOBIC) 7.5 MG tablet Take 1 tablet (7.5 mg total) by mouth 2 (two) times daily. For pain Patient not taking: Reported on 07/02/2019 10/22/18   Connye Burkitt, NP  Multiple Vitamin (MULTIVITAMIN WITH MINERALS) TABS tablet Take 1 tablet by mouth daily. Patient not taking: Reported on 07/02/2019 10/23/18   Connye Burkitt, NP  potassium chloride SA (K-DUR,KLOR-CON) 20 MEQ tablet Take 1 tablet (20 mEq total) by mouth daily. Patient not taking: Reported on 07/02/2019 07/05/15   Copland, Gay Filler, MD    Inpatient Medications: Scheduled Meds: . enoxaparin (LOVENOX) injection  0.5 mg/kg Subcutaneous Q24H  . furosemide  40 mg Intravenous BID  . hydrALAZINE  50 mg Oral Q8H  . isosorbide mononitrate  15 mg Oral Daily  . losartan  25 mg Oral Daily  . melatonin  3 mg Oral QHS  . pantoprazole (PROTONIX) IV  40 mg Intravenous Q12H  . polyethylene glycol  17 g Oral Daily  . potassium chloride  30 mEq Oral BID  . QUEtiapine  600 mg Oral QHS  . senna-docusate  1 tablet Oral BID  . sodium chloride flush  3 mL Intravenous Q12H   Continuous Infusions: . sodium chloride    . potassium chloride 10 mEq (05/03/20 0645)   PRN Meds: sodium chloride, acetaminophen, albuterol, dicyclomine, hydrOXYzine, nitroGLYCERIN, ondansetron (ZOFRAN) IV, sodium chloride flush, zolpidem  Allergies:    Allergies  Allergen Reactions  . Ketorolac Other (See Comments)    Anxiety and confusion  . Morphine And Related Other (See Comments)    Mental status changes (confusion and anxiety)  . Penicillins Itching    Has patient had a PCN reaction causing immediate rash, facial/tongue/throat swelling, SOB or lightheadedness  with hypotension: Yes Has patient had a PCN reaction causing severe rash involving mucus membranes or skin necrosis: No Has patient had a PCN reaction that required hospitalization No Has patient had a PCN reaction occurring within the last 10 years: No If all of the above answers are "NO", then may proceed with Cephalosporin use.   . Trazodone And Nefazodone     Restless and opposite effect.     Social History:   Social History   Socioeconomic History  . Marital status: Legally Separated    Spouse name: Not on file  . Number of children: 2  . Years of education: Not on file  . Highest education level: Not on file  Occupational History  . Occupation: DISABLED    Employer: UNEMPLOYED  Tobacco Use  . Smoking status: Never Smoker  . Smokeless tobacco: Never Used  Substance and  Sexual Activity  . Alcohol use: No  . Drug use: Yes    Types: Other-see comments, Marijuana    Comment: opiates  . Sexual activity: Not Currently    Birth control/protection: Surgical  Other Topics Concern  . Not on file  Social History Narrative  . Not on file   Social Determinants of Health   Financial Resource Strain:   . Difficulty of Paying Living Expenses: Not on file  Food Insecurity:   . Worried About Charity fundraiser in the Last Year: Not on file  . Ran Out of Food in the Last Year: Not on file  Transportation Needs:   . Lack of Transportation (Medical): Not on file  . Lack of Transportation (Non-Medical): Not on file  Physical Activity:   . Days of Exercise per Week: Not on file  . Minutes of Exercise per Session: Not on file  Stress:   . Feeling of Stress : Not on file  Social Connections:   . Frequency of Communication with Friends and Family: Not on file  . Frequency of Social Gatherings with Friends and Family: Not on file  . Attends Religious Services: Not on file  . Active Member of Clubs or Organizations: Not on file  . Attends Archivist Meetings: Not on file    . Marital Status: Not on file  Intimate Partner Violence:   . Fear of Current or Ex-Partner: Not on file  . Emotionally Abused: Not on file  . Physically Abused: Not on file  . Sexually Abused: Not on file    Family History:   Family History  Problem Relation Age of Onset  . Diabetes Mother   . Suicidality Mother   . Heart disease Father   . Depression Father    Family Status:  Family Status  Relation Name Status  . Mother  Alive  . Father  Deceased    ROS:  Please see the history of present illness.  All other ROS reviewed and negative.     Physical Exam/Data:   Vitals:   05/02/20 2235 05/02/20 2241 05/03/20 0230 05/03/20 0548  BP: 118/82  (!) 166/103 (!) 147/86  Pulse: (!) 107 (!) 103 (!) 102 (!) 52  Resp: 20 20 18 19   Temp: 98.4 F (36.9 C)  98.6 F (37 C) 98.2 F (36.8 C)  TempSrc: Oral  Oral Oral  SpO2: (!) 89% 92% 94% 97%  Weight:      Height:        Intake/Output Summary (Last 24 hours) at 05/03/2020 0812 Last data filed at 05/03/2020 0645 Gross per 24 hour  Intake 460 ml  Output 2551 ml  Net -2091 ml   Filed Weights   05/01/20 1655  Weight: 106.6 kg   Body mass index is 37.93 kg/m.   General: Pleasant, well developed, well nourished, NAD Neck: Negative for carotid bruits. No JVD Lungs: Diminished in bilateral lower lobes. No wheezes, rales, or rhonchi. Breathing is unlabored. Cardiovascular: RRR with S1 S2. No murmurs Abdomen: Soft, non-tender, non-distended. No obvious abdominal masses. Extremities: 1+ BLE edema.  Radial pulses 2+ bilaterally Neuro: Alert and oriented. No focal deficits. No facial asymmetry. MAE spontaneously. Psych: Responds to questions appropriately with normal affect.    EKG:  The EKG was personally reviewed and demonstrates: 05/01/2020 sinus tachycardia, HR 108 bpm with nonspecific T wave abnormalities Telemetry:  Telemetry was personally reviewed and demonstrates: 05/03/2020 sinus tachycardia, HR low 100's   Relevant  CV Studies:  Echocardiogram  05/02/2020:  1. Left ventricular ejection fraction, by estimation, is 25 to 30%. The  left ventricle has severely decreased function. The left ventricle  demonstrates global hypokinesis. There is mild left ventricular  hypertrophy. Left ventricular diastolic parameters  are consistent with Grade III diastolic dysfunction (restrictive).  2. Right ventricular systolic function is normal. The right ventricular  size is normal. There is mildly elevated pulmonary artery systolic  pressure.  3. The mitral valve is normal in structure. Mild mitral valve  regurgitation.  4. The aortic valve is normal in structure. Aortic valve regurgitation is  not visualized.   Echocardiogram 07/16/2009:  - Left ventricle: The cavity size was normal. Systolic function was   normal. The estimated ejection fraction was in the range of 60% to   65%. Wall motion was normal; there were no regional wall motion   abnormalities. Doppler parameters are consistent with abnormal   left ventricular relaxation (grade 1 diastolic dysfunction).  - Aortic valve: There was no stenosis.  - Mitral valve: No significant regurgitation.  - Right ventricle: The cavity size was normal. Systolic function was   normal.  - Pulmonary arteries: PA peak pressure: 58mm Hg (S).  - Inferior vena cava: The vessel was normal in size; the   respirophasic diameter changes were in the normal range (= 50%);   findings are consistent with normal central venous pressure.  - Pericardium, extracardiac: A trivial pericardial effusion was   identified. There was a left pleural effusion.  Transthoracic echocardiography. M-mode, complete 2D, spectral  Doppler, and color Doppler. Height: Height: 162.6cm. Height: 64in.  Weight: Weight: 90.7kg. Weight: 199.6lb. Body mass index: BMI:  34.3kg/m^2. Body surface area: BSA: 1.4m^2. Blood pressure: 140/90.  Patient status: Outpatient. Location:  [facility]    Laboratory Data:  Chemistry Recent Labs  Lab 05/01/20 1201 05/02/20 0600 05/03/20 0518  NA 145 142 141  K 3.7 3.5 2.6*  CL 106 105 100  CO2 24 25 28   GLUCOSE 137* 148* 118*  BUN 18 18 16   CREATININE 1.04* 0.94 1.02*  CALCIUM 9.4 9.0 8.6*  GFRNONAA >60 >60 >60  GFRAA >60 >60 >60  ANIONGAP 15 12 13     Total Protein  Date Value Ref Range Status  05/03/2020 6.5 6.5 - 8.1 g/dL Final   Albumin  Date Value Ref Range Status  05/03/2020 3.9 3.5 - 5.0 g/dL Final   AST  Date Value Ref Range Status  05/03/2020 58 (H) 15 - 41 U/L Final   ALT  Date Value Ref Range Status  05/03/2020 102 (H) 0 - 44 U/L Final   Alkaline Phosphatase  Date Value Ref Range Status  05/03/2020 83 38 - 126 U/L Final   Total Bilirubin  Date Value Ref Range Status  05/03/2020 2.3 (H) 0.3 - 1.2 mg/dL Final   Hematology Recent Labs  Lab 05/01/20 1201 05/03/20 0518  WBC 10.5 9.2  RBC 3.79* 4.24  HGB 11.9* 13.3  HCT 36.7 40.6  MCV 96.8 95.8  MCH 31.4 31.4  MCHC 32.4 32.8  RDW 14.9 15.1  PLT 296 303   Cardiac EnzymesNo results for input(s): TROPONINI in the last 168 hours. No results for input(s): TROPIPOC in the last 168 hours.  BNP Recent Labs  Lab 05/01/20 1201  BNP 1,560.3*    DDimer No results for input(s): DDIMER in the last 168 hours. TSH:  Lab Results  Component Value Date   TSH 2.002 10/21/2018   Lipids: Lab Results  Component Value Date  CHOL 281 (H) 10/21/2018   HDL 46 10/21/2018   LDLCALC 217 (H) 10/21/2018   TRIG 91 10/21/2018   CHOLHDL 6.1 10/21/2018   HgbA1c: Lab Results  Component Value Date   HGBA1C 5.6 10/21/2018    Radiology/Studies:  DG Chest 2 View  Result Date: 05/01/2020 CLINICAL DATA:  Shortness of breath over the last 5 days. History of hypertension. Coronavirus exposure. EXAM: CHEST - 2 VIEW COMPARISON:  01/11/2014 FINDINGS: Mild cardiac enlargement. Hazy and patchy widespread bilateral pulmonary density. Small amount of  fluid in the fissures. The appearance could be due to either congestive heart failure for viral pneumonia. CHF is favored. No significant bone finding. IMPRESSION: Widespread hazy and patchy pulmonary density. This could represent congestive heart failure or viral pneumonia. CHF is favored. Electronically Signed   By: Nelson Chimes M.D.   On: 05/01/2020 11:46   DG Abd 1 View  Result Date: 05/02/2020 CLINICAL DATA:  Shortness of breath for 5 days EXAM: ABDOMEN - 1 VIEW COMPARISON:  Acute abdominal series 03/01/2015 FINDINGS: Moderate colonic stool burden with air-filled appearance of the transverse colon with some questionable fold thickening. No high-grade obstructive bowel gas pattern is seen. Few phleboliths noted over the pelvis. Without concerning calcifications over the included urinary tract or visible portions of the gallbladder fossa. Mild degenerative changes in the spine, hips and pelvis. IMPRESSION: 1. Moderate colonic stool burden 2. Air-filled appearance of the transverse colon with some questionable fold thickening, could reflect some nonspecific colitis. Correlate with symptoms. 3. No high-grade obstructive bowel gas pattern. Electronically Signed   By: Lovena Le M.D.   On: 05/02/2020 15:42   ECHOCARDIOGRAM COMPLETE  Result Date: 05/02/2020    ECHOCARDIOGRAM REPORT   Patient Name:   Dorothy Alvarez Date of Exam: 05/02/2020 Medical Rec #:  [id number]   Height:       66.0 in Accession #:    [id number]  Weight:       235.0 lb Date of Birth:  Feb 14, 1966   BSA:          2.142 m Patient Age:    65 years    BP:           189/134 mmHg [name]           HR:           112 bpm. Exam Location:  Inpatient Procedure: 2D Echo Indications:    786.09 dyspnea  History:        Patient has no prior history of Echocardiogram examinations.                 Risk Factors:Hypertension.  Sonographer:    Jannett Celestine RDCS (AE) Referring Phys: [id number] [place] T TU IMPRESSIONS  1. Left ventricular ejection fraction,  by estimation, is 25 to 30%. The left ventricle has severely decreased function. The left ventricle demonstrates global hypokinesis. There is mild left ventricular hypertrophy. Left ventricular diastolic parameters  are consistent with Grade III diastolic dysfunction (restrictive).  2. Right ventricular systolic function is normal. The right ventricular size is normal. There is mildly elevated pulmonary artery systolic pressure.  3. The mitral valve is normal in structure. Mild mitral valve regurgitation.  4. The aortic valve is normal in structure. Aortic valve regurgitation is not visualized. FINDINGS  Left Ventricle: Left ventricular ejection fraction, by estimation, is 25 to 30%. The left ventricle has severely decreased function. The left ventricle demonstrates global hypokinesis. The left ventricular internal cavity size was normal in  size. There is mild left ventricular hypertrophy. Left ventricular diastolic parameters are consistent with Grade III diastolic dysfunction (restrictive). Right Ventricle: The right ventricular size is normal. No increase in right ventricular wall thickness. Right ventricular systolic function is normal. There is mildly elevated pulmonary artery systolic pressure. Left Atrium: Left atrial size was normal in size. Right Atrium: Right atrial size was normal in size. Pericardium: There is no evidence of pericardial effusion. Mitral Valve: The mitral valve is normal in structure. Mild mitral valve regurgitation. Tricuspid Valve: The tricuspid valve is normal in structure. Tricuspid valve regurgitation is mild. Aortic Valve: The aortic valve is normal in structure. Aortic valve regurgitation is not visualized. Pulmonic Valve: The pulmonic valve was normal in structure. Pulmonic valve regurgitation is not visualized. Aorta: The aortic root and ascending aorta are structurally normal, with no evidence of dilitation. IAS/Shunts: The atrial septum is grossly normal.  LEFT VENTRICLE PLAX  2D LVIDd:         5.20 cm LVIDs:         3.30 cm LV PW:         1.30 cm LV IVS:        1.10 cm LVOT diam:     2.10 cm LV SV:         32 LV SV Index:   15 LVOT Area:     3.46 cm  LEFT ATRIUM         Index LA diam:    4.20 cm 1.96 cm/m  AORTIC VALVE LVOT Vmax:   68.50 cm/s LVOT Vmean:  50.600 cm/s LVOT VTI:    0.092 m  AORTA Ao Root diam: 3.30 cm MITRAL VALVE               TRICUSPID VALVE MV Area (PHT): 3.60 cm    TR Peak grad:   37.0 mmHg MV Decel Time: 211 msec    TR Vmax:        304.00 cm/s MV E velocity: 99.40 cm/s                            SHUNTS                            Systemic VTI:  0.09 m                            Systemic Diam: 2.10 cm Mertie Moores MD Electronically signed by Mertie Moores MD Signature Date/Time: 05/02/2020/5:38:08 PM    Final    Assessment and Plan:   1.  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  Prior echo performed 06/2009 with normal LV function at 60 to 65% with grade 1 DD. -Suspect new LV dysfunction secondary to uncontrolled hypertension however cannot definitively rule out CAD -BNP elevated at 1560  -hsT found to be elevated however flat at 83>> 84>> likely in the setting of demand ischemia secondary to hypertensive urgency -EKG with no acute ST segment changes, nonspecific T wave abnormalities -Creatinine was stable at 1.04.   -CXR consistent with CHF with hazy and patchy pulmonary  densities.   -Weight, 235lb -I &O, net -2 L -Continue IV Lasix 40 mg twice daily>> appears close to euvolemia -Continue losartan 25 for now until ACEI washout then would start Entresto 24/26 and uptitrate as BP allows.   -Stop Imdur 15 mg p.o. daily  -Start  carvedilol 3.125 mg p.o. twice daily -Patient has never undergone ischemic evaluation and given degree of systolic and diastolic dysfunction, would recommend further evaluation with [facility] for more definitive diagnosis.    2.  Hypertensive urgency: -Patient has a known history of hypertension previously on amlodipine, HCTZ and an ARB however reports medication noncompliance for quite some time.  BPs on ED presentation with systolic pressures in the 190 range.  Likely the cause of her new systolic dysfunction -Currently on hydralazine 50 mg every 8 hour, Imdur 15, losartan 25 -BPs stabilizing at 147/86, 118/82 -Previously noted to be 189/134, 171/132, 194/135 -Continue losartan 25 until ACEI washout then would (9/17) then would initiate Entresto 24/26 and start carvedilol 3.125 mg. Uptitrate medications as BP allows.  3.  Hypokalemia: -K+, 2.6 today -Replaced per primary team -Follow lab work  4.  Elevated troponin: -hsT found to be 83>> 84 likely in the setting of demand ischemia secondary to hypertensive urgency/fluid volume overload -Denies anginal symptoms -No prior history of CAD -See plan for #1   For questions or updates, please contact [facility] Please consult [email or web address] for contact info under Cardiology/STEMI.   SignedKathyrn Drown NP-C [facility] Pager: [phone] 05/03/2020 8:12 AM  I have seen and examined the patient along with Kathyrn Drown NP-C.  I have reviewed the chart, notes and new data.  I agree with PA/NP's note.  Key new complaints: breathing markedly better Key examination changes: obese, appears clinically euvolemic at this point.  Blood pressure markedly improved. Key new findings / data: Markedly hypokalemic (being repleted), normal renal parameters. Echo reviewed. Note absence of coronary or aortic calcification on CT chest and absence of renal artery stenosis on CT abdomen 2014/normal kidney sizes [place] 2016.  PLAN: R and L heart cath tomorrow  as long as not dangerously hypokalemic. This procedure has been fully reviewed with the patient and written informed consent has been obtained. Had long discussion re: possible etiology of CMP, treatment with meds, Na restriction, weight monitoring, signs and symptoms of HF exacerbation. On losartan, stch to intermediate dose Entresto (49/51) after cardiac cath. If euvolemic and clean coronaries at cath (which I expect will be the case), may be ready for DC tomorrow with quick follow up for titration of entresto and carvedilol. Hydralazine+nitrates also likely to be helpful, but may 3xdaily dosing will challenge her compliance, so would try to maximize twice daily ARNI and beta blocker.  Sanda Klein, MD, [place] [phone] 05/03/2020, 1:31 PM

## 2020-05-03 NOTE — Consult Note (Addendum)
Cardiology Consultation:   Patient ID: Dorothy Alvarez; [id number]; 1965/10/31   Admit date: 05/01/2020 Date of Consult: 05/03/2020  Primary Care Provider: Nolene Ebbs, MD Primary Cardiologist: New to [facility]   Patient Profile:   Dorothy Alvarez is a 54 y.o. female with a hx of hypertension and depression who is being seen today for the evaluation of new systolic dysfunction at the request of Dr. Erlinda Hong.  History of Present Illness:   [name] is a 54 year old female with a history stated above who presented to [facility] 05/01/2020 with a 3-week history of dyspnea on exertion, orthopnea/sleep apnea and lower extremity edema symptoms.  She reports her symptoms have been ongoing for several months however have worsened over the last several weeks as above.  She denies chest pain or other anginal symptoms. She has a history of hypertension and was previously prescribed amlodipine, HCTZ and ARB however states she has not taken these regularly for over a year. She denies tobacco, alcohol or illicit drug use.  She has no personal or family history of CAD.  In the ED, she was found to be markedly hypertensive with systolic blood pressures in the 190 range.  BNP elevated at 1560. hsT found to be elevated however flat at 83>> 84.  EKG with no acute ST segment changes.  Creatinine was stable at 1.04.  CXR consistent with CHF with hazy and patchy pulmonary densities.  She was started on IV Lasix 40 mg daily.  She was remotely seen by Dr. Johnsie Cancel and underwent an echocardiogram which showed an LVEF at 60 to 65% with grade 1 DD.  Repeat echocardiogram performed 05/02/2020 with a reduced LV EF at 25 to 30% with global hypokinesis, mild LVH, grade 3 restrictive diastolic dysfunction, mildly elevated PA systolic pressures, and mild MR.  Past Medical History:  Diagnosis Date  . Allergy   . Anxiety   . Arthritis   . Chronic abdominal pain   . Constipation   . Depression   . Hypertension   . IBS (irritable bowel  syndrome)   . Learning disability   . Neuromuscular disorder [facility])     Past Surgical History:  Procedure Laterality Date  . ABDOMINAL HYSTERECTOMY    . GALLBLADDER SURGERY    . KNEE SURGERY       Prior to Admission medications   Medication Sig Start Date End Date Taking? Authorizing Provider  amLODipine (NORVASC) 10 MG tablet Take 1 tablet (10 mg total) by mouth daily. 07/01/19  Yes Couture, Cortni S, PA-C  chlorproMAZINE (THORAZINE) 100 MG tablet Take 1 tablet (100 mg total) by mouth at bedtime. 07/03/19  Yes Merlyn Lot E, NP  gabapentin (NEURONTIN) 100 MG capsule Take 100 mg by mouth 3 (three) times daily. 02/07/20  Yes [provider]  PARoxetine (PAXIL) 10 MG tablet Take 10 mg by mouth daily.   Yes [provider]  QUEtiapine (SEROQUEL) 300 MG tablet Take 600 mg by mouth at bedtime. 03/17/20  Yes [provider]  tetrahydrozoline 0.05 % ophthalmic solution Place 1 drop into both eyes 3 (three) times daily as needed (Eye irritation). 10/22/18  Yes Connye Burkitt, NP  cyclobenzaprine (FLEXERIL) 10 MG tablet Take 1 tablet (10 mg total) by mouth 3 (three) times daily as needed for muscle spasms. Patient not taking: Reported on 07/02/2019 03/26/19   Drenda Freeze, MD  fluticasone [facility]) 50 MCG/ACT nasal spray Place 1 spray into both nostrils daily. Patient not taking: Reported on 07/02/2019 10/23/18  Connye Burkitt, NP  furosemide (LASIX) 40 MG tablet TAKE 1 TABLET BY MOUTH EVERY DAY Patient not taking: Reported on 05/01/2020 10/05/15   Ivar Drape D, PA  hydrochlorothiazide (HYDRODIURIL) 12.5 MG tablet Take 1 tablet (12.5 mg total) by mouth daily. Patient not taking: Reported on 05/01/2020 07/01/19 07/31/19  Couture, Cortni S, PA-C  hydrOXYzine (ATARAX/VISTARIL) 25 MG tablet Take 1 tablet (25 mg total) by mouth every 6 (six) hours as needed for anxiety. Patient not taking: Reported on 07/02/2019 10/22/18   Connye Burkitt, NP  irbesartan (AVAPRO) 75 MG  tablet Take 1 tablet (75 mg total) by mouth daily. For high blood pressure Patient not taking: Reported on 07/02/2019 03/26/19   Drenda Freeze, MD  loratadine (CLARITIN) 10 MG tablet Take 1 tablet (10 mg total) by mouth daily. Patient not taking: Reported on 07/02/2019 10/23/18   Connye Burkitt, NP  meloxicam (MOBIC) 7.5 MG tablet Take 1 tablet (7.5 mg total) by mouth 2 (two) times daily. For pain Patient not taking: Reported on 07/02/2019 10/22/18   Connye Burkitt, NP  Multiple Vitamin (MULTIVITAMIN WITH MINERALS) TABS tablet Take 1 tablet by mouth daily. Patient not taking: Reported on 07/02/2019 10/23/18   Connye Burkitt, NP  potassium chloride SA (K-DUR,KLOR-CON) 20 MEQ tablet Take 1 tablet (20 mEq total) by mouth daily. Patient not taking: Reported on 07/02/2019 07/05/15   Copland, Gay Filler, MD    Inpatient Medications: Scheduled Meds: . enoxaparin (LOVENOX) injection  0.5 mg/kg Subcutaneous Q24H  . furosemide  40 mg Intravenous BID  . hydrALAZINE  50 mg Oral Q8H  . isosorbide mononitrate  15 mg Oral Daily  . losartan  25 mg Oral Daily  . melatonin  3 mg Oral QHS  . pantoprazole (PROTONIX) IV  40 mg Intravenous Q12H  . polyethylene glycol  17 g Oral Daily  . potassium chloride  30 mEq Oral BID  . QUEtiapine  600 mg Oral QHS  . senna-docusate  1 tablet Oral BID  . sodium chloride flush  3 mL Intravenous Q12H   Continuous Infusions: . sodium chloride    . potassium chloride 10 mEq (05/03/20 0645)   PRN Meds: sodium chloride, acetaminophen, albuterol, dicyclomine, hydrOXYzine, nitroGLYCERIN, ondansetron (ZOFRAN) IV, sodium chloride flush, zolpidem  Allergies:    Allergies  Allergen Reactions  . Ketorolac Other (See Comments)    Anxiety and confusion  . Morphine And Related Other (See Comments)    Mental status changes (confusion and anxiety)  . Penicillins Itching    Has patient had a PCN reaction causing immediate rash, facial/tongue/throat swelling, SOB or lightheadedness  with hypotension: Yes Has patient had a PCN reaction causing severe rash involving mucus membranes or skin necrosis: No Has patient had a PCN reaction that required hospitalization No Has patient had a PCN reaction occurring within the last 10 years: No If all of the above answers are "NO", then may proceed with Cephalosporin use.   . Trazodone And Nefazodone     Restless and opposite effect.     Social History:   Social History   Socioeconomic History  . Marital status: Legally Separated    Spouse name: Not on file  . Number of children: 2  . Years of education: Not on file  . Highest education level: Not on file  Occupational History  . Occupation: DISABLED    Employer: UNEMPLOYED  Tobacco Use  . Smoking status: Never Smoker  . Smokeless tobacco: Never Used  Substance and  Sexual Activity  . Alcohol use: No  . Drug use: Yes    Types: Other-see comments, Marijuana    Comment: opiates  . Sexual activity: Not Currently    Birth control/protection: Surgical  Other Topics Concern  . Not on file  Social History Narrative  . Not on file   Social Determinants of Health   Financial Resource Strain:   . Difficulty of Paying Living Expenses: Not on file  Food Insecurity:   . Worried About Charity fundraiser in the Last Year: Not on file  . Ran Out of Food in the Last Year: Not on file  Transportation Needs:   . Lack of Transportation (Medical): Not on file  . Lack of Transportation (Non-Medical): Not on file  Physical Activity:   . Days of Exercise per Week: Not on file  . Minutes of Exercise per Session: Not on file  Stress:   . Feeling of Stress : Not on file  Social Connections:   . Frequency of Communication with Friends and Family: Not on file  . Frequency of Social Gatherings with Friends and Family: Not on file  . Attends Religious Services: Not on file  . Active Member of Clubs or Organizations: Not on file  . Attends Archivist Meetings: Not on file    . Marital Status: Not on file  Intimate Partner Violence:   . Fear of Current or Ex-Partner: Not on file  . Emotionally Abused: Not on file  . Physically Abused: Not on file  . Sexually Abused: Not on file    Family History:   Family History  Problem Relation Age of Onset  . Diabetes Mother   . Suicidality Mother   . Heart disease Father   . Depression Father    Family Status:  Family Status  Relation Name Status  . Mother  Alive  . Father  Deceased    ROS:  Please see the history of present illness.  All other ROS reviewed and negative.     Physical Exam/Data:   Vitals:   05/02/20 2235 05/02/20 2241 05/03/20 0230 05/03/20 0548  BP: 118/82  (!) 166/103 (!) 147/86  Pulse: (!) 107 (!) 103 (!) 102 (!) 52  Resp: 20 20 18 19   Temp: 98.4 F (36.9 C)  98.6 F (37 C) 98.2 F (36.8 C)  TempSrc: Oral  Oral Oral  SpO2: (!) 89% 92% 94% 97%  Weight:      Height:        Intake/Output Summary (Last 24 hours) at 05/03/2020 0812 Last data filed at 05/03/2020 0645 Gross per 24 hour  Intake 460 ml  Output 2551 ml  Net -2091 ml   Filed Weights   05/01/20 1655  Weight: 106.6 kg   Body mass index is 37.93 kg/m.   General: Pleasant, well developed, well nourished, NAD Neck: Negative for carotid bruits. No JVD Lungs: Diminished in bilateral lower lobes. No wheezes, rales, or rhonchi. Breathing is unlabored. Cardiovascular: RRR with S1 S2. No murmurs Abdomen: Soft, non-tender, non-distended. No obvious abdominal masses. Extremities: 1+ BLE edema.  Radial pulses 2+ bilaterally Neuro: Alert and oriented. No focal deficits. No facial asymmetry. MAE spontaneously. Psych: Responds to questions appropriately with normal affect.    EKG:  The EKG was personally reviewed and demonstrates: 05/01/2020 sinus tachycardia, HR 108 bpm with nonspecific T wave abnormalities Telemetry:  Telemetry was personally reviewed and demonstrates: 05/03/2020 sinus tachycardia, HR low 100's   Relevant  CV Studies:  Echocardiogram  05/02/2020:  1. Left ventricular ejection fraction, by estimation, is 25 to 30%. The  left ventricle has severely decreased function. The left ventricle  demonstrates global hypokinesis. There is mild left ventricular  hypertrophy. Left ventricular diastolic parameters  are consistent with Grade III diastolic dysfunction (restrictive).  2. Right ventricular systolic function is normal. The right ventricular  size is normal. There is mildly elevated pulmonary artery systolic  pressure.  3. The mitral valve is normal in structure. Mild mitral valve  regurgitation.  4. The aortic valve is normal in structure. Aortic valve regurgitation is  not visualized.   Echocardiogram 07/16/2009:  - Left ventricle: The cavity size was normal. Systolic function was   normal. The estimated ejection fraction was in the range of 60% to   65%. Wall motion was normal; there were no regional wall motion   abnormalities. Doppler parameters are consistent with abnormal   left ventricular relaxation (grade 1 diastolic dysfunction).  - Aortic valve: There was no stenosis.  - Mitral valve: No significant regurgitation.  - Right ventricle: The cavity size was normal. Systolic function was   normal.  - Pulmonary arteries: PA peak pressure: 55mm Hg (S).  - Inferior vena cava: The vessel was normal in size; the   respirophasic diameter changes were in the normal range (= 50%);   findings are consistent with normal central venous pressure.  - Pericardium, extracardiac: A trivial pericardial effusion was   identified. There was a left pleural effusion.  Transthoracic echocardiography. M-mode, complete 2D, spectral  Doppler, and color Doppler. Height: Height: 162.6cm. Height: 64in.  Weight: Weight: 90.7kg. Weight: 199.6lb. Body mass index: BMI:  34.3kg/m^2. Body surface area: BSA: 1.28m^2. Blood pressure: 140/90.  Patient status: Outpatient. Location:  [facility]    Laboratory Data:  Chemistry Recent Labs  Lab 05/01/20 1201 05/02/20 0600 05/03/20 0518  NA 145 142 141  K 3.7 3.5 2.6*  CL 106 105 100  CO2 24 25 28   GLUCOSE 137* 148* 118*  BUN 18 18 16   CREATININE 1.04* 0.94 1.02*  CALCIUM 9.4 9.0 8.6*  GFRNONAA >60 >60 >60  GFRAA >60 >60 >60  ANIONGAP 15 12 13     Total Protein  Date Value Ref Range Status  05/03/2020 6.5 6.5 - 8.1 g/dL Final   Albumin  Date Value Ref Range Status  05/03/2020 3.9 3.5 - 5.0 g/dL Final   AST  Date Value Ref Range Status  05/03/2020 58 (H) 15 - 41 U/L Final   ALT  Date Value Ref Range Status  05/03/2020 102 (H) 0 - 44 U/L Final   Alkaline Phosphatase  Date Value Ref Range Status  05/03/2020 83 38 - 126 U/L Final   Total Bilirubin  Date Value Ref Range Status  05/03/2020 2.3 (H) 0.3 - 1.2 mg/dL Final   Hematology Recent Labs  Lab 05/01/20 1201 05/03/20 0518  WBC 10.5 9.2  RBC 3.79* 4.24  HGB 11.9* 13.3  HCT 36.7 40.6  MCV 96.8 95.8  MCH 31.4 31.4  MCHC 32.4 32.8  RDW 14.9 15.1  PLT 296 303   Cardiac EnzymesNo results for input(s): TROPONINI in the last 168 hours. No results for input(s): TROPIPOC in the last 168 hours.  BNP Recent Labs  Lab 05/01/20 1201  BNP 1,560.3*    DDimer No results for input(s): DDIMER in the last 168 hours. TSH:  Lab Results  Component Value Date   TSH 2.002 10/21/2018   Lipids: Lab Results  Component Value Date  CHOL 281 (H) 10/21/2018   HDL 46 10/21/2018   LDLCALC 217 (H) 10/21/2018   TRIG 91 10/21/2018   CHOLHDL 6.1 10/21/2018   HgbA1c: Lab Results  Component Value Date   HGBA1C 5.6 10/21/2018    Radiology/Studies:  DG Chest 2 View  Result Date: 05/01/2020 CLINICAL DATA:  Shortness of breath over the last 5 days. History of hypertension. Coronavirus exposure. EXAM: CHEST - 2 VIEW COMPARISON:  01/11/2014 FINDINGS: Mild cardiac enlargement. Hazy and patchy widespread bilateral pulmonary density. Small amount of  fluid in the fissures. The appearance could be due to either congestive heart failure for viral pneumonia. CHF is favored. No significant bone finding. IMPRESSION: Widespread hazy and patchy pulmonary density. This could represent congestive heart failure or viral pneumonia. CHF is favored. Electronically Signed   By: Nelson Chimes M.D.   On: 05/01/2020 11:46   DG Abd 1 View  Result Date: 05/02/2020 CLINICAL DATA:  Shortness of breath for 5 days EXAM: ABDOMEN - 1 VIEW COMPARISON:  Acute abdominal series 03/01/2015 FINDINGS: Moderate colonic stool burden with air-filled appearance of the transverse colon with some questionable fold thickening. No high-grade obstructive bowel gas pattern is seen. Few phleboliths noted over the pelvis. Without concerning calcifications over the included urinary tract or visible portions of the gallbladder fossa. Mild degenerative changes in the spine, hips and pelvis. IMPRESSION: 1. Moderate colonic stool burden 2. Air-filled appearance of the transverse colon with some questionable fold thickening, could reflect some nonspecific colitis. Correlate with symptoms. 3. No high-grade obstructive bowel gas pattern. Electronically Signed   By: Lovena Le M.D.   On: 05/02/2020 15:42   ECHOCARDIOGRAM COMPLETE  Result Date: 05/02/2020    ECHOCARDIOGRAM REPORT   Patient Name:   OMYA Demont Date of Exam: 05/02/2020 Medical Rec #:  [id number]   Height:       66.0 in Accession #:    [id number]  Weight:       235.0 lb Date of Birth:  1965-12-01   BSA:          2.142 m Patient Age:    47 years    BP:           189/134 mmHg [name]           HR:           112 bpm. Exam Location:  Inpatient Procedure: 2D Echo Indications:    786.09 dyspnea  History:        Patient has no prior history of Echocardiogram examinations.                 Risk Factors:Hypertension.  Sonographer:    Jannett Celestine RDCS (AE) Referring Phys: [id number] [place] T TU IMPRESSIONS  1. Left ventricular ejection fraction,  by estimation, is 25 to 30%. The left ventricle has severely decreased function. The left ventricle demonstrates global hypokinesis. There is mild left ventricular hypertrophy. Left ventricular diastolic parameters  are consistent with Grade III diastolic dysfunction (restrictive).  2. Right ventricular systolic function is normal. The right ventricular size is normal. There is mildly elevated pulmonary artery systolic pressure.  3. The mitral valve is normal in structure. Mild mitral valve regurgitation.  4. The aortic valve is normal in structure. Aortic valve regurgitation is not visualized. FINDINGS  Left Ventricle: Left ventricular ejection fraction, by estimation, is 25 to 30%. The left ventricle has severely decreased function. The left ventricle demonstrates global hypokinesis. The left ventricular internal cavity size was normal in  size. There is mild left ventricular hypertrophy. Left ventricular diastolic parameters are consistent with Grade III diastolic dysfunction (restrictive). Right Ventricle: The right ventricular size is normal. No increase in right ventricular wall thickness. Right ventricular systolic function is normal. There is mildly elevated pulmonary artery systolic pressure. Left Atrium: Left atrial size was normal in size. Right Atrium: Right atrial size was normal in size. Pericardium: There is no evidence of pericardial effusion. Mitral Valve: The mitral valve is normal in structure. Mild mitral valve regurgitation. Tricuspid Valve: The tricuspid valve is normal in structure. Tricuspid valve regurgitation is mild. Aortic Valve: The aortic valve is normal in structure. Aortic valve regurgitation is not visualized. Pulmonic Valve: The pulmonic valve was normal in structure. Pulmonic valve regurgitation is not visualized. Aorta: The aortic root and ascending aorta are structurally normal, with no evidence of dilitation. IAS/Shunts: The atrial septum is grossly normal.  LEFT VENTRICLE PLAX  2D LVIDd:         5.20 cm LVIDs:         3.30 cm LV PW:         1.30 cm LV IVS:        1.10 cm LVOT diam:     2.10 cm LV SV:         32 LV SV Index:   15 LVOT Area:     3.46 cm  LEFT ATRIUM         Index LA diam:    4.20 cm 1.96 cm/m  AORTIC VALVE LVOT Vmax:   68.50 cm/s LVOT Vmean:  50.600 cm/s LVOT VTI:    0.092 m  AORTA Ao Root diam: 3.30 cm MITRAL VALVE               TRICUSPID VALVE MV Area (PHT): 3.60 cm    TR Peak grad:   37.0 mmHg MV Decel Time: 211 msec    TR Vmax:        304.00 cm/s MV E velocity: 99.40 cm/s                            SHUNTS                            Systemic VTI:  0.09 m                            Systemic Diam: 2.10 cm Mertie Moores MD Electronically signed by Mertie Moores MD Signature Date/Time: 05/02/2020/5:38:08 PM    Final    Assessment and Plan:   1.  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  Prior echo performed 06/2009 with normal LV function at 60 to 65% with grade 1 DD. -Suspect new LV dysfunction secondary to uncontrolled hypertension however cannot definitively rule out CAD -BNP elevated at 1560  -hsT found to be elevated however flat at 83>> 84>> likely in the setting of demand ischemia secondary to hypertensive urgency -EKG with no acute ST segment changes, nonspecific T wave abnormalities -Creatinine was stable at 1.04.   -CXR consistent with CHF with hazy and patchy pulmonary  densities.   -Weight, 235lb -I &O, net -2 L -Continue IV Lasix 40 mg twice daily>> appears close to euvolemia -Continue losartan 25 for now until ACEI washout then would start Entresto 24/26 and uptitrate as BP allows.   -Stop Imdur 15 mg p.o. daily  -Start  carvedilol 3.125 mg p.o. twice daily -Patient has never undergone ischemic evaluation and given degree of systolic and diastolic dysfunction, would recommend further evaluation with [facility] for more definitive diagnosis.    2.  Hypertensive urgency: -Patient has a known history of hypertension previously on amlodipine, HCTZ and an ARB however reports medication noncompliance for quite some time.  BPs on ED presentation with systolic pressures in the 190 range.  Likely the cause of her new systolic dysfunction -Currently on hydralazine 50 mg every 8 hour, Imdur 15, losartan 25 -BPs stabilizing at 147/86, 118/82 -Previously noted to be 189/134, 171/132, 194/135 -Continue losartan 25 until ACEI washout then would (9/17) then would initiate Entresto 24/26 and start carvedilol 3.125 mg. Uptitrate medications as BP allows.  3.  Hypokalemia: -K+, 2.6 today -Replaced per primary team -Follow lab work  4.  Elevated troponin: -hsT found to be 83>> 84 likely in the setting of demand ischemia secondary to hypertensive urgency/fluid volume overload -Denies anginal symptoms -No prior history of CAD -See plan for #1   For questions or updates, please contact [facility] Please consult [email or web address] for contact info under Cardiology/STEMI.   SignedKathyrn Drown NP-C [facility] Pager: [phone] 05/03/2020 8:12 AM  I have seen and examined the patient along with Kathyrn Drown NP-C.  I have reviewed the chart, notes and new data.  I agree with PA/NP's note.  Key new complaints: breathing markedly better Key examination changes: obese, appears clinically euvolemic at this point.  Blood pressure markedly improved. Key new findings / data: Markedly hypokalemic (being repleted), normal renal parameters. Echo reviewed. Note absence of coronary or aortic calcification on CT chest and absence of renal artery stenosis on CT abdomen 2014/normal kidney sizes [place] 2016.  PLAN: R and L heart cath tomorrow  as long as not dangerously hypokalemic. This procedure has been fully reviewed with the patient and written informed consent has been obtained. Had long discussion re: possible etiology of CMP, treatment with meds, Na restriction, weight monitoring, signs and symptoms of HF exacerbation. On losartan, stch to intermediate dose Entresto (49/51) after cardiac cath. If euvolemic and clean coronaries at cath (which I expect will be the case), may be ready for DC tomorrow with quick follow up for titration of entresto and carvedilol. Hydralazine+nitrates also likely to be helpful, but may 3xdaily dosing will challenge her compliance, so would try to maximize twice daily ARNI and beta blocker.  Sanda Klein, MD, [place] [phone] 05/03/2020, 1:31 PM

## 2020-05-03 NOTE — Progress Notes (Signed)
Pt unsure if she wants to try CPAP QHS, Pt states she will Notify RT if she decides to wear.  RT to monitor and assess as needed.

## 2020-05-04 ENCOUNTER — Telehealth: Payer: Self-pay | Admitting: Cardiovascular Disease

## 2020-05-04 ENCOUNTER — Encounter (HOSPITAL_COMMUNITY)
Admission: EM | Disposition: A | Discharge status: Home / Self Care | Payer: Self-pay | Source: Home / Self Care | Attending: Internal Medicine

## 2020-05-04 DIAGNOSIS — I2510 Atherosclerotic heart disease of native coronary artery without angina pectoris: Secondary | ICD-10-CM

## 2020-05-04 DIAGNOSIS — I2721 Secondary pulmonary arterial hypertension: Secondary | ICD-10-CM

## 2020-05-04 HISTORY — PX: RIGHT/LEFT HEART CATH AND CORONARY ANGIOGRAPHY: CATH118266

## 2020-05-04 LAB — BASIC METABOLIC PANEL
Anion gap: 13 (ref 5–15)
BUN: 20 mg/dL (ref 6–20)
CO2: 29 mmol/L (ref 22–32)
Calcium: 9.5 mg/dL (ref 8.9–10.3)
Chloride: 102 mmol/L (ref 98–111)
Creatinine, Ser: 1.19 mg/dL — ABNORMAL HIGH (ref 0.44–1.00)
GFR calc Af Amer: 60 mL/min — ABNORMAL LOW (ref >60)
GFR calc non Af Amer: 51 mL/min — ABNORMAL LOW (ref >60)
Glucose, Bld: 123 mg/dL — ABNORMAL HIGH (ref 70–99)
Potassium: 3.5 mmol/L (ref 3.5–5.1)
Sodium: 144 mmol/L (ref 135–145)

## 2020-05-04 LAB — POCT I-STAT 7, (LYTES, BLD GAS, ICA,H+H)
Acid-Base Excess: 6 mmol/L — ABNORMAL HIGH (ref 0.0–2.0)
Bicarbonate: 32 mmol/L — ABNORMAL HIGH (ref 20.0–28.0)
Calcium, Ion: 1.16 mmol/L (ref 1.15–1.40)
HCT: 42 % (ref 36.0–46.0)
Hemoglobin: 14.3 g/dL (ref 12.0–15.0)
O2 Saturation: 99 %
Potassium: 3.6 mmol/L (ref 3.5–5.1)
Sodium: 145 mmol/L (ref 135–145)
TCO2: 33 mmol/L — ABNORMAL HIGH (ref 22–32)
pCO2 arterial: 48.5 mmHg — ABNORMAL HIGH (ref 32.0–48.0)
pH, Arterial: 7.428 (ref 7.350–7.450)
pO2, Arterial: 115 mmHg — ABNORMAL HIGH (ref 83.0–108.0)

## 2020-05-04 LAB — SURGICAL PCR SCREEN
MRSA, PCR: NEGATIVE
Staphylococcus aureus: NEGATIVE

## 2020-05-04 LAB — POCT I-STAT EG7
Acid-Base Excess: 8 mmol/L — ABNORMAL HIGH (ref 0.0–2.0)
Bicarbonate: 33.6 mmol/L — ABNORMAL HIGH (ref 20.0–28.0)
Calcium, Ion: 1.13 mmol/L — ABNORMAL LOW (ref 1.15–1.40)
HCT: 42 % (ref 36.0–46.0)
Hemoglobin: 14.3 g/dL (ref 12.0–15.0)
O2 Saturation: 62 %
Potassium: 3.6 mmol/L (ref 3.5–5.1)
Sodium: 146 mmol/L — ABNORMAL HIGH (ref 135–145)
TCO2: 35 mmol/L — ABNORMAL HIGH (ref 22–32)
pCO2, Ven: 51.1 mmHg (ref 44.0–60.0)
pH, Ven: 7.426 (ref 7.250–7.430)
pO2, Ven: 32 mmHg (ref 32.0–45.0)

## 2020-05-04 LAB — HEPATITIS PANEL, ACUTE
HCV Ab: NONREACTIVE
Hep A IgM: NONREACTIVE
Hep B C IgM: NONREACTIVE
Hepatitis B Surface Ag: NONREACTIVE

## 2020-05-04 LAB — LIPID PANEL
Cholesterol: 176 mg/dL (ref 0–200)
HDL: 31 mg/dL — ABNORMAL LOW (ref >40)
LDL Cholesterol: 121 mg/dL — ABNORMAL HIGH (ref 0–99)
Total CHOL/HDL Ratio: 5.7 RATIO
Triglycerides: 119 mg/dL (ref <150)
VLDL: 24 mg/dL (ref 0–40)

## 2020-05-04 LAB — MAGNESIUM: Magnesium: 1.9 mg/dL (ref 1.7–2.4)

## 2020-05-04 SURGERY — RIGHT/LEFT HEART CATH AND CORONARY ANGIOGRAPHY
Anesthesia: LOCAL

## 2020-05-04 MED ORDER — HEPARIN SODIUM (PORCINE) 1000 UNIT/ML IJ SOLN
INTRAMUSCULAR | Status: DC | PRN
  Administered 2020-05-04: 5000 [IU] via INTRAVENOUS

## 2020-05-04 MED ORDER — IOHEXOL 350 MG/ML SOLN
INTRAVENOUS | Status: DC | PRN
  Administered 2020-05-04: 60 mL

## 2020-05-04 MED ORDER — LABETALOL HCL 5 MG/ML IV SOLN
INTRAVENOUS | Status: AC
  Filled 2020-05-04: qty 4

## 2020-05-04 MED ORDER — HEPARIN (PORCINE) IN NACL 1000-0.9 UT/500ML-% IV SOLN
INTRAVENOUS | Status: AC
  Filled 2020-05-04: qty 1000

## 2020-05-04 MED ORDER — VERAPAMIL HCL 2.5 MG/ML IV SOLN
INTRAVENOUS | Status: DC | PRN
  Administered 2020-05-04: 10 mL via INTRA_ARTERIAL

## 2020-05-04 MED ORDER — MIDAZOLAM HCL 2 MG/2ML IJ SOLN
INTRAMUSCULAR | Status: DC | PRN
  Administered 2020-05-04: 1 mg via INTRAVENOUS
  Administered 2020-05-04: 0.5 mg via INTRAVENOUS
  Administered 2020-05-04: 1 mg via INTRAVENOUS

## 2020-05-04 MED ORDER — SODIUM CHLORIDE 0.9 % IV SOLN: INTRAVENOUS | Status: DC

## 2020-05-04 MED ORDER — ASPIRIN 81 MG PO CHEW: 81.0000 mg | CHEWABLE_TABLET | ORAL | Status: DC

## 2020-05-04 MED ORDER — LABETALOL HCL 5 MG/ML IV SOLN
10.0000 mg | INTRAVENOUS | Status: DC | PRN
  Administered 2020-05-04: 10 mg via INTRAVENOUS

## 2020-05-04 MED ORDER — SODIUM CHLORIDE 0.9 % IV SOLN: 250.0000 mL | INTRAVENOUS | Status: DC | PRN

## 2020-05-04 MED ORDER — CARVEDILOL 3.125 MG PO TABS
3.1250 mg | ORAL_TABLET | Freq: Two times a day (BID) | ORAL | 0 refills | Status: DC
Start: 2020-05-04 — End: 2021-07-31

## 2020-05-04 MED ORDER — HEPARIN (PORCINE) IN NACL 1000-0.9 UT/500ML-% IV SOLN
INTRAVENOUS | Status: DC | PRN
  Administered 2020-05-04 (×2): 500 mL

## 2020-05-04 MED ORDER — FUROSEMIDE 80 MG PO TABS: 80.0000 mg | ORAL_TABLET | Freq: Every day | ORAL | 0 refills | Status: DC

## 2020-05-04 MED ORDER — CARVEDILOL 3.125 MG PO TABS
3.1250 mg | ORAL_TABLET | Freq: Two times a day (BID) | ORAL | 0 refills | Status: DC
Start: 2020-05-04 — End: 2020-05-04

## 2020-05-04 MED ORDER — FENTANYL CITRATE (PF) 100 MCG/2ML IJ SOLN
INTRAMUSCULAR | Status: AC
  Filled 2020-05-04: qty 2

## 2020-05-04 MED ORDER — HYDRALAZINE HCL 25 MG PO TABS: 75.0000 mg | ORAL_TABLET | Freq: Three times a day (TID) | ORAL | 0 refills | Status: DC

## 2020-05-04 MED ORDER — ISOSORBIDE MONONITRATE ER 30 MG PO TB24
15.0000 mg | ORAL_TABLET | Freq: Two times a day (BID) | ORAL | 0 refills | Status: DC
Start: 2020-05-04 — End: 2020-05-04

## 2020-05-04 MED ORDER — SODIUM CHLORIDE 0.9% FLUSH: 3.0000 mL | INTRAVENOUS | Status: DC | PRN

## 2020-05-04 MED ORDER — HEPARIN SODIUM (PORCINE) 1000 UNIT/ML IJ SOLN
INTRAMUSCULAR | Status: AC
  Filled 2020-05-04: qty 1

## 2020-05-04 MED ORDER — SODIUM CHLORIDE 0.9% FLUSH: 3.0000 mL | Freq: Two times a day (BID) | INTRAVENOUS | Status: DC

## 2020-05-04 MED ORDER — ENTRESTO 49-51 MG PO TABS: 1.0000 | ORAL_TABLET | Freq: Two times a day (BID) | ORAL | 0 refills | Status: DC

## 2020-05-04 MED ORDER — ASPIRIN 81 MG PO CHEW
81.0000 mg | CHEWABLE_TABLET | ORAL | Status: AC
  Administered 2020-05-04: 81 mg via ORAL
  Filled 2020-05-04: qty 1

## 2020-05-04 MED ORDER — VERAPAMIL HCL 2.5 MG/ML IV SOLN
INTRAVENOUS | Status: AC
  Filled 2020-05-04: qty 2

## 2020-05-04 MED ORDER — ONDANSETRON HCL 4 MG/2ML IJ SOLN: 4.0000 mg | Freq: Four times a day (QID) | INTRAMUSCULAR | Status: DC | PRN

## 2020-05-04 MED ORDER — LIDOCAINE HCL (PF) 1 % IJ SOLN
INTRAMUSCULAR | Status: AC
  Filled 2020-05-04: qty 30

## 2020-05-04 MED ORDER — ISOSORBIDE MONONITRATE ER 30 MG PO TB24
15.0000 mg | ORAL_TABLET | Freq: Two times a day (BID) | ORAL | 0 refills | Status: DC
Start: 2020-05-04 — End: 2021-07-31

## 2020-05-04 MED ORDER — LIDOCAINE HCL (PF) 1 % IJ SOLN
INTRAMUSCULAR | Status: DC | PRN
  Administered 2020-05-04 (×2): 2 mL

## 2020-05-04 MED ORDER — POTASSIUM CHLORIDE CRYS ER 20 MEQ PO TBCR
40.0000 meq | EXTENDED_RELEASE_TABLET | Freq: Once | ORAL | Status: DC
  Filled 2020-05-04: qty 2

## 2020-05-04 MED ORDER — FENTANYL CITRATE (PF) 100 MCG/2ML IJ SOLN
INTRAMUSCULAR | Status: DC | PRN
Start: 2020-05-04 — End: 2020-05-04
  Administered 2020-05-04 (×3): 25 ug via INTRAVENOUS

## 2020-05-04 MED ORDER — MIDAZOLAM HCL 2 MG/2ML IJ SOLN
INTRAMUSCULAR | Status: AC
  Filled 2020-05-04: qty 2

## 2020-05-04 MED ORDER — POTASSIUM CHLORIDE CRYS ER 20 MEQ PO TBCR: 20.0000 meq | EXTENDED_RELEASE_TABLET | Freq: Every day | ORAL | 0 refills | Status: DC

## 2020-05-04 MED ORDER — OXYCODONE HCL 5 MG PO TABS: 5.0000 mg | ORAL_TABLET | ORAL | Status: DC | PRN

## 2020-05-04 MED ORDER — ACETAMINOPHEN 325 MG PO TABS: 650.0000 mg | ORAL_TABLET | ORAL | Status: DC | PRN

## 2020-05-04 MED ORDER — ENOXAPARIN SODIUM 40 MG/0.4ML ~~LOC~~ SOLN: 40.0000 mg | SUBCUTANEOUS | Status: DC

## 2020-05-04 MED ORDER — HYDRALAZINE HCL 20 MG/ML IJ SOLN: 10.0000 mg | INTRAMUSCULAR | Status: DC | PRN

## 2020-05-04 MED ORDER — SODIUM CHLORIDE 0.9 % IV SOLN: INTRAVENOUS | Status: AC

## 2020-05-04 SURGICAL SUPPLY — 13 items
CATH 5FR JL3.5 JR4 ANG PIG MP (CATHETERS) ×1 IMPLANT
CATH BALLN WEDGE 5F 110CM (CATHETERS) ×1 IMPLANT
DEVICE RAD COMP TR BAND LRG (VASCULAR PRODUCTS) ×1 IMPLANT
GLIDESHEATH SLEND A-KIT 6F 22G (SHEATH) ×1 IMPLANT
GUIDEWIRE INQWIRE 1.5J.035X260 (WIRE) IMPLANT
INQWIRE 1.5J .035X260CM (WIRE) ×2
KIT HEART LEFT (KITS) ×2 IMPLANT
KIT MICROPUNCTURE NIT STIFF (SHEATH) ×1 IMPLANT
PACK CARDIAC CATHETERIZATION (CUSTOM PROCEDURE TRAY) ×2 IMPLANT
SHEATH GLIDE SLENDER 4/5FR (SHEATH) ×1 IMPLANT
SHEATH PROBE COVER 6X72 (BAG) ×1 IMPLANT
TRANSDUCER W/STOPCOCK (MISCELLANEOUS) ×2 IMPLANT
TUBING CIL FLEX 10 FLL-RA (TUBING) ×2 IMPLANT

## 2020-05-04 NOTE — Telephone Encounter (Signed)
Per PA, set up [place] visit on 05/10/19 at 11:15 am with Almyra Deforest.

## 2020-05-04 NOTE — Interval H&P Note (Signed)
Cath Lab Visit (complete for each Cath Lab visit)  Clinical Evaluation Leading to the Procedure:   ACS: No.  Non-ACS:    Anginal Classification: No Symptoms  Anti-ischemic medical therapy: Minimal Therapy (1 class of medications)  Non-Invasive Test Results: No non-invasive testing performed  Prior CABG: No previous CABG      History and Physical Interval Note:  05/04/2020 9:16 AM  [name]  has presented today for surgery, with the diagnosis of cardiomyopathy.  The various methods of treatment have been discussed with the patient and family. After consideration of risks, benefits and other options for treatment, the patient has consented to  Procedure(s): RIGHT/LEFT HEART CATH AND CORONARY ANGIOGRAPHY (N/A) as a surgical intervention.  The patient's history has been reviewed, patient examined, no change in status, stable for surgery.  I have reviewed the patient's chart and labs.  Questions were answered to the patient's satisfaction.     Belva Crome III

## 2020-05-04 NOTE — Progress Notes (Signed)
AVS given to patient and explained at the bedside. Medications and follow up appointments have been explained with pt verbalizing understanding.  

## 2020-05-04 NOTE — Progress Notes (Signed)
Cath results reviewed.  She is still a little hypervolemic (PAWP mean 18) at 226 lb. Disproportionately high mean PAP 35 (transpulmonary gradient 17, PVR 3 WU) consistent with intrinsic pulmonary arteriolar disease as well (untreated OSA a leading possibility).  Recommend: - Furosemide 80 mg daily - Entresto 49/51 mg twice daily (stop losartan) - Carvedilol 3.125 mg twice daily - Hydralazine 75 mg three times daily - Isosorbide mononitrate 30 mg, half tab twice daily  Will DC her previous home rx for amlodipine, irbesartan and HCTZ (which she was not really taking).  As outpt will try to titrate up Entresto and carvedilol and introduce spironolactone, maybe dapagliflozin; hopefully will stop the hydralazine nitrates which are harder to be compliant with.  Had a lengthy discussion regarding daily weight monitoring - would shoot for a target "dry weight" of 220-225 lb, and dietary sodium restriction. Avoid regular use of NSAIDs (stop meloxicam).  Will need a sleep study as an outpatient.  Will arrange a TOC visit in 2 weeks.  Sanda Klein, MD, [facility] [phone] office [phone] pager

## 2020-05-04 NOTE — Care Management Important Message (Signed)
Important Message  Patient Details IM Letter given to the [name] MRN: [id number] Date of Birth: May 06, 1966   Medicare Important Message Given:  Yes     Kerin Salen 05/04/2020, 10:00 AM

## 2020-05-04 NOTE — Telephone Encounter (Signed)
Currently admitted. TOC call for 9/20

## 2020-05-04 NOTE — Discharge Summary (Addendum)
Discharge Summary  Dorothy Alvarez IWP:[id number] DOB: 1966-01-11  PCP: Nolene Ebbs, MD  Admit date: 05/01/2020 Discharge date: 05/04/2020  Time spent: 49mins, more than 50% time spent on coordination of care.   Recommendations for Outpatient Follow-up:  1. F/u with PCP within a week  for hospital discharge follow up, repeat cbc/bmp at follow up, pcp to arrange outpatient sleep study 2. F/u with cardiology on 9/22  Discharge Diagnoses:  Active Hospital Problems   Diagnosis Date Noted   Acute CHF (congestive heart failure) ([place]) 05/01/2020   AKI (acute kidney injury) ([place]) 05/01/2020   BMI 37.0-37.9, adult 05/01/2020   Essential hypertension 07/03/2009    Resolved Hospital Problems  No resolved problems to display.    Discharge Condition: stable  Diet recommendation: heart healthy  Filed Weights   05/01/20 1655 05/03/20 1022 05/04/20 0500  Weight: 106.6 kg 103.1 kg 102.6 kg    History of present illness: ( per admitting MD Dr Flossie Buffy) Chief Complaint: Increasing shortness of breath  HPI: Dorothy Alvarez is a 54 y.o. female with medical history significant for hypertension and depression who presents with concerns of increasing shortness of breath.  For the past 3 weeks, patient reports that her husband was telling her that it did not seem like she was breathing at night in her sleep.  She herself has woken up out of bed gasping for air.  She is having to sit up and is afraid that if she lay flat and go to sleep she would not wake up.  She also progressively has felt increasing dyspnea with exertion and at rest.  Denies any coughing or fevers.  No chest pain but sometimes feel like her heart is racing.  Over the last 3 days she is also noticed increased lower extremity edema around her ankles.  No history of recent surgery. Patient has history of hypertension and was noted to have been prescribed amlodipine, HCTZ and an ARB in the past but admits that she has not been compliant with  these medications. She denies any tobacco, alcohol or illicit drug use.  No family history of cardiac issues.  ED Course: She was afebrile, tachycardic and hypertensive up to systolic of 505L over 976B on room air. Lab work notable for BNP of 1560.  Troponin flat at 83 and 84.  EKG shows nonspecific T wave changes to lateral leads. CBC showed no leukocytosis, has mild anemia with hemoglobin of 11.9.  Blood glucose of 137.  Creatinine mildly elevated 1.04.  Chest x-ray showing hazy and patchy pulmonary density favoring likely CHF.  Hospital Course:  Principal Problem:   Acute CHF (congestive heart failure) (HCC) Active Problems:   Essential hypertension   AKI (acute kidney injury) ([place])   BMI 37.0-37.9, adult   Acute heart failure with pulmonary edema -Echo "a reduced LV EF at 25 to 30% with global hypokinesis, mild LVH, grade 3 restrictive diastolic dysfunction, mildly elevated PA systolic pressures, and mild MR." s/p right and left heart cath on 9/17   Showed "Widely patent coronary arteries.  Acute on chronic combined systolic and diastolic heart failure with mean wedge 18 mmHg and LVEDP 20 mmHg  Moderate pulmonary hypertension, mean PAP 35 mmHg; . WHO Group II (Hemodynamic classification: Pulmonary capillary wedge pressure>15; pulmonary vascular resistance > 3 WU)"  RECOMMENDATIONS:  Guideline directed therapy for systolic heart failure.  Consider obstructive sleep apnea as a contributor to pulmonary hypertension given the elevated vascular resistance.  -she diuresed well in the hospital , she is  feeling better, she is cleared to discharge home by cardiology.  She is discharged on  "Furosemide 80 mg daily - Entresto 49/51 mg twice daily  - Carvedilol 3.125 mg twice daily - Hydralazine 75 mg three times daily - Isosorbide mononitrate 30 mg, half tab twice daily"  She is to follow up with cardiology on 9/22.    Hypokalemia, replaced , mag 1.9. she is discharged on  potassium 82meq daily for a week, cardiology and pcp to monitor k and decide on further potassium replacement needs  Elevated LFT, likely from liver congestion in the setting of acute heart failure hepatitis panel negative,  abdominal ultrasound "Increased hepatic echogenicity, consistent with steatosis." no acute findings F/u with pcp  Uncontrolled hypertension with hypertension emergency Report noncompliant with medication Blood pressure much improved and she is discharged on  "Furosemide 80 mg daily - Entresto 49/51 mg twice daily  - Carvedilol 3.125 mg twice daily - Hydralazine 75 mg three times daily - Isosorbide mononitrate 30 mg, half tab twice daily"  CKDII Cr stable, f/u with pcp    Obesity, reports h/o OSA but not on home cpap Body mass index is 36.67 kg/m. cpap ordered while in the hospital Outpatient sleep study recommended  Psych: continue home meds paxil and seroquel She is pleasant and calm F/u with pcp  UDS + for opioid  DVT prophylaxis: lovenox   Code Status:full Family Communication: patient Disposition:   Status is: Inpatient  Dispo: The patient is from: home  Anticipated d/c is to: home   Consultants:   Cardiology  Procedures:   Right and left heart cath planned on September 17  Antimicrobials:   none   Discharge Exam: BP (!) 131/96 (BP Location: Left Arm)    Pulse 90    Temp 98.9 F (37.2 C) (Oral)    Resp (!) 21    Ht 5\' 6"  (1.676 m)    Wt 102.6 kg    SpO2 93%    BMI 36.51 kg/m   General: NAD Cardiovascular: RRR Respiratory: improved aeration, no wheezing  Discharge Instructions You were cared for by a hospitalist during your hospital stay. If you have any questions about your discharge medications or the care you received while you were in the hospital after you are discharged, you can call the unit and asked to speak with the hospitalist on call if the hospitalist that took care of  you is not available. Once you are discharged, your primary care physician will handle any further medical issues. Please note that NO REFILLS for any discharge medications will be authorized once you are discharged, as it is imperative that you return to your primary care physician (or establish a relationship with a primary care physician if you do not have one) for your aftercare needs so that they can reassess your need for medications and monitor your lab values.  Discharge Instructions    Diet - low sodium heart healthy   Complete by: As directed    Discharge instructions   Complete by: As directed    daily weight monitoring - a target "dry weight" of 220-225 lb dietary sodium restriction.  Avoid regular use of NSAIDs (stop meloxicam).   Increase activity slowly   Complete by: As directed      Allergies as of 05/04/2020      Reactions   Ketorolac Other (See Comments)   Anxiety and confusion   Morphine And Related Other (See Comments)   Mental status changes (confusion and anxiety)  Penicillins Itching   Has patient had a PCN reaction causing immediate rash, facial/tongue/throat swelling, SOB or lightheadedness with hypotension: Yes Has patient had a PCN reaction causing severe rash involving mucus membranes or skin necrosis: No Has patient had a PCN reaction that required hospitalization No Has patient had a PCN reaction occurring within the last 10 years: No If all of the above answers are "NO", then may proceed with Cephalosporin use.   Trazodone And Nefazodone    Restless and opposite effect.       Medication List    STOP taking these medications   amLODipine 10 MG tablet Commonly known as: NORVASC   chlorproMAZINE 100 MG tablet Commonly known as: THORAZINE   cyclobenzaprine 10 MG tablet Commonly known as: FLEXERIL   fluticasone 50 MCG/ACT nasal spray Commonly known as: FLONASE   gabapentin 100 MG capsule Commonly known as: NEURONTIN   hydrochlorothiazide 12.5  MG tablet Commonly known as: HYDRODIURIL   hydrOXYzine 25 MG tablet Commonly known as: ATARAX/VISTARIL   irbesartan 75 MG tablet Commonly known as: AVAPRO   loratadine 10 MG tablet Commonly known as: CLARITIN   meloxicam 7.5 MG tablet Commonly known as: MOBIC   multivitamin with minerals Tabs tablet   PARoxetine 10 MG tablet Commonly known as: PAXIL   tetrahydrozoline 0.05 % ophthalmic solution     TAKE these medications   carvedilol 3.125 MG tablet Commonly known as: COREG Take 1 tablet (3.125 mg total) by mouth 2 (two) times daily with a meal.   Entresto 49-51 MG Generic drug: sacubitril-valsartan Take 1 tablet by mouth 2 (two) times daily. If prior authorization needed ,please fax  request to [facility] cardiology Dr Croitoru's office   furosemide 80 MG tablet Commonly known as: Lasix Take 1 tablet (80 mg total) by mouth daily. What changed:   medication strength  how much to take   hydrALAZINE 25 MG tablet Commonly known as: APRESOLINE Take 3 tablets (75 mg total) by mouth 3 (three) times daily.   isosorbide mononitrate 30 MG 24 hr tablet Commonly known as: IMDUR Take 0.5 tablets (15 mg total) by mouth 2 (two) times daily.   potassium chloride SA 20 MEQ tablet Commonly known as: KLOR-CON Take 1 tablet (20 mEq total) by mouth daily for 7 days.   QUEtiapine 300 MG tablet Commonly known as: SEROQUEL Take 600 mg by mouth at bedtime.      Allergies  Allergen Reactions   Ketorolac Other (See Comments)    Anxiety and confusion   Morphine And Related Other (See Comments)    Mental status changes (confusion and anxiety)   Penicillins Itching    Has patient had a PCN reaction causing immediate rash, facial/tongue/throat swelling, SOB or lightheadedness with hypotension: Yes Has patient had a PCN reaction causing severe rash involving mucus membranes or skin necrosis: No Has patient had a PCN reaction that required hospitalization No Has patient had a PCN  reaction occurring within the last 10 years: No If all of the above answers are "NO", then may proceed with Cephalosporin use.    Trazodone And Nefazodone     Restless and opposite effect.     Follow-up Information    Almyra Deforest, [name] Follow up on 05/09/2020.   Specialties: Cardiology, Radiology Why: at 11:15am  Contact information: [address] [place] [address] [phone]        Nolene Ebbs, MD Follow up in 1 week(s).   Specialty: Internal Medicine Why: hospital discharge follow up, pcp  to refer you to have outpatient sleep study Contact information: [place] [address] [phone]                The results of significant diagnostics from this hospitalization (including imaging, microbiology, ancillary and laboratory) are listed below for reference.    Significant Diagnostic Studies: DG Chest 2 View  Result Date: 05/01/2020 CLINICAL DATA:  Shortness of breath over the last 5 days. History of hypertension. Coronavirus exposure. EXAM: CHEST - 2 VIEW COMPARISON:  01/11/2014 FINDINGS: Mild cardiac enlargement. Hazy and patchy widespread bilateral pulmonary density. Small amount of fluid in the fissures. The appearance could be due to either congestive heart failure for viral pneumonia. CHF is favored. No significant bone finding. IMPRESSION: Widespread hazy and patchy pulmonary density. This could represent congestive heart failure or viral pneumonia. CHF is favored. Electronically Signed   By: Nelson Chimes M.D.   On: 05/01/2020 11:46   DG Abd 1 View  Result Date: 05/02/2020 CLINICAL DATA:  Shortness of breath for 5 days EXAM: ABDOMEN - 1 VIEW COMPARISON:  Acute abdominal series 03/01/2015 FINDINGS: Moderate colonic stool burden with air-filled appearance of the transverse colon with some questionable fold thickening. No high-grade obstructive bowel gas pattern is seen. Few phleboliths noted over the pelvis. Without concerning  calcifications over the included urinary tract or visible portions of the gallbladder fossa. Mild degenerative changes in the spine, hips and pelvis. IMPRESSION: 1. Moderate colonic stool burden 2. Air-filled appearance of the transverse colon with some questionable fold thickening, could reflect some nonspecific colitis. Correlate with symptoms. 3. No high-grade obstructive bowel gas pattern. Electronically Signed   By: Lovena Le M.D.   On: 05/02/2020 15:42   CARDIAC CATHETERIZATION  Result Date: 05/04/2020  Widely patent coronary arteries.  Acute on chronic combined systolic and diastolic heart failure with mean wedge 18 mmHg and LVEDP 20 mmHg  Moderate pulmonary hypertension, mean PAP 35 mmHg; . WHO Group II (Hemodynamic classification: Pulmonary capillary wedge pressure>15; pulmonary vascular resistance > 3 WU) RECOMMENDATIONS:  Guideline directed therapy for systolic heart failure.  Consider obstructive sleep apnea as a contributor to pulmonary hypertension given the elevated vascular resistance.  ECHOCARDIOGRAM COMPLETE  Result Date: 05/02/2020    ECHOCARDIOGRAM REPORT   Patient Name:   Dorothy Alvarez Date of Exam: 05/02/2020 Medical Rec #:  [id number]   Height:       66.0 in Accession #:    [id number]  Weight:       235.0 lb Date of Birth:  May 08, 1966   BSA:          2.142 m Patient Age:    70 years    BP:           189/134 mmHg [name]           HR:           112 bpm. Exam Location:  Inpatient Procedure: 2D Echo Indications:    786.09 dyspnea  History:        Patient has no prior history of Echocardiogram examinations.                 Risk Factors:Hypertension.  Sonographer:    Jannett Celestine RDCS (AE) Referring Phys: [id number] [place] T TU IMPRESSIONS  1. Left ventricular ejection fraction, by estimation, is 25 to 30%. The left ventricle has severely decreased function. The left ventricle demonstrates global hypokinesis. There is mild left ventricular hypertrophy. Left ventricular diastolic  parameters  are  consistent with Grade III diastolic dysfunction (restrictive).  2. Right ventricular systolic function is normal. The right ventricular size is normal. There is mildly elevated pulmonary artery systolic pressure.  3. The mitral valve is normal in structure. Mild mitral valve regurgitation.  4. The aortic valve is normal in structure. Aortic valve regurgitation is not visualized. FINDINGS  Left Ventricle: Left ventricular ejection fraction, by estimation, is 25 to 30%. The left ventricle has severely decreased function. The left ventricle demonstrates global hypokinesis. The left ventricular internal cavity size was normal in size. There is mild left ventricular hypertrophy. Left ventricular diastolic parameters are consistent with Grade III diastolic dysfunction (restrictive). Right Ventricle: The right ventricular size is normal. No increase in right ventricular wall thickness. Right ventricular systolic function is normal. There is mildly elevated pulmonary artery systolic pressure. Left Atrium: Left atrial size was normal in size. Right Atrium: Right atrial size was normal in size. Pericardium: There is no evidence of pericardial effusion. Mitral Valve: The mitral valve is normal in structure. Mild mitral valve regurgitation. Tricuspid Valve: The tricuspid valve is normal in structure. Tricuspid valve regurgitation is mild. Aortic Valve: The aortic valve is normal in structure. Aortic valve regurgitation is not visualized. Pulmonic Valve: The pulmonic valve was normal in structure. Pulmonic valve regurgitation is not visualized. Aorta: The aortic root and ascending aorta are structurally normal, with no evidence of dilitation. IAS/Shunts: The atrial septum is grossly normal.  LEFT VENTRICLE PLAX 2D LVIDd:         5.20 cm LVIDs:         3.30 cm LV PW:         1.30 cm LV IVS:        1.10 cm LVOT diam:     2.10 cm LV SV:         32 LV SV Index:   15 LVOT Area:     3.46 cm  LEFT ATRIUM         Index  LA diam:    4.20 cm 1.96 cm/m  AORTIC VALVE LVOT Vmax:   68.50 cm/s LVOT Vmean:  50.600 cm/s LVOT VTI:    0.092 m  AORTA Ao Root diam: 3.30 cm MITRAL VALVE               TRICUSPID VALVE MV Area (PHT): 3.60 cm    TR Peak grad:   37.0 mmHg MV Decel Time: 211 msec    TR Vmax:        304.00 cm/s MV E velocity: 99.40 cm/s                            SHUNTS                            Systemic VTI:  0.09 m                            Systemic Diam: 2.10 cm Mertie Moores MD Electronically signed by Mertie Moores MD Signature Date/Time: 05/02/2020/5:38:08 PM    Final    US Abdomen Limited RUQ  Result Date: 05/03/2020 CLINICAL DATA:  Elevated liver function tests. EXAM: ULTRASOUND ABDOMEN LIMITED RIGHT UPPER QUADRANT COMPARISON:  01/09/2015. FINDINGS: Gallbladder: No gallstones or wall thickening visualized. No sonographic Murphy sign noted by sonographer. Common bile duct: Diameter: Common duct not visualized. No gross intrahepatic duct dilatation.  Liver: Moderately increased echogenicity. Portal vein is patent on color Doppler imaging with normal direction of blood flow towards the liver. Other: Limited exam secondary to patient body habitus and overlying bowel gas. IMPRESSION: 1. Limited exam as detailed above. 2. Increased hepatic echogenicity, consistent with steatosis. Electronically Signed   By: Abigail Miyamoto M.D.   On: 05/03/2020 19:02    Microbiology: Recent Results (from the past 240 hour(s))  SARS Coronavirus 2 by RT PCR (hospital order, performed in [facility] hospital lab) Nasopharyngeal Nasopharyngeal Swab     Status: None   Collection Time: 05/01/20  4:30 PM   Specimen: Nasopharyngeal Swab  Result Value Ref Range Status   SARS Coronavirus 2 NEGATIVE NEGATIVE Final    Comment: (NOTE) SARS-CoV-2 target nucleic acids are NOT DETECTED.  The SARS-CoV-2 RNA is generally detectable in upper and lower respiratory specimens during the acute phase of infection. The lowest concentration of SARS-CoV-2  viral copies this assay can detect is 250 copies / mL. A negative result does not preclude SARS-CoV-2 infection and should not be used as the sole basis for treatment or other patient management decisions.  A negative result may occur with improper specimen collection / handling, submission of specimen other than nasopharyngeal swab, presence of viral mutation(s) within the areas targeted by this assay, and inadequate number of viral copies (<250 copies / mL). A negative result must be combined with clinical observations, patient history, and epidemiological information.  Fact Sheet for Patients:   StrictlyIdeas.no  Fact Sheet for Healthcare Providers: BankingDealers.co.za  This test is not yet approved or  cleared by the [place] FDA and has been authorized for detection and/or diagnosis of SARS-CoV-2 by FDA under an Emergency Use Authorization (EUA).  This EUA will remain in effect (meaning this test can be used) for the duration of the COVID-19 declaration under Section 564(b)(1) of the Act, 21 U.S.C. section 360bbb-3(b)(1), unless the authorization is terminated or revoked sooner.  Performed at [facility], [place] [address], [place], [place] [address]   Surgical pcr screen     Status: None   Collection Time: 05/04/20  3:28 AM   Specimen: Nasal Mucosa; Nasal Swab  Result Value Ref Range Status   MRSA, PCR NEGATIVE NEGATIVE Final   Staphylococcus aureus NEGATIVE NEGATIVE Final    Comment: (NOTE) The Xpert SA Assay (FDA approved for NASAL specimens in patients 54 years of age and older), is one component of a comprehensive surveillance program. It is not intended to diagnose infection nor to guide or monitor treatment. Performed at [facility], [place] [address], [place], [place] [address]      Labs: Basic Metabolic Panel: Recent Labs  Lab 05/01/20 1201 05/02/20 0600 05/03/20 0518  05/04/20 0453  NA 145 142 141 144  K 3.7 3.5 2.6* 3.5  CL 106 105 100 102  CO2 24 25 28 29   GLUCOSE 137* 148* 118* 123*  BUN 18 18 16 20   CREATININE 1.04* 0.94 1.02* 1.19*  CALCIUM 9.4 9.0 8.6* 9.5  MG  --   --   --  1.9   Liver Function Tests: Recent Labs  Lab 05/03/20 0518 05/03/20 1607  AST 58* 99*  ALT 102* 107*  ALKPHOS 83 93  BILITOT 2.3* 1.8*  PROT 6.5 7.1  ALBUMIN 3.9 4.2   Recent Labs  Lab 05/03/20 0518  LIPASE 33   No results for input(s): AMMONIA in the last 168 hours. CBC: Recent Labs  Lab 05/01/20 1201  05/03/20 0518  WBC 10.5 9.2  NEUTROABS  --  5.1  HGB 11.9* 13.3  HCT 36.7 40.6  MCV 96.8 95.8  PLT 296 303   Cardiac Enzymes: No results for input(s): CKTOTAL, CKMB, CKMBINDEX, TROPONINI in the last 168 hours. BNP: BNP (last 3 results) Recent Labs    05/01/20 1201  BNP 1,560.3*    ProBNP (last 3 results) No results for input(s): PROBNP in the last 8760 hours.  CBG: No results for input(s): GLUCAP in the last 168 hours.     Signed:  Florencia Reasons MD, PhD, FACP  [facility] 05/04/2020, 3:06 PM

## 2020-05-07 ENCOUNTER — Encounter (HOSPITAL_COMMUNITY): Payer: Self-pay | Admitting: Interventional Cardiology

## 2020-05-07 NOTE — Telephone Encounter (Signed)
Called patient to discuss TOC questions.  Patient did not answer- LVM to call back.

## 2020-05-08 NOTE — Telephone Encounter (Signed)
Left message to call back  Left message with appointment info

## 2020-05-09 ENCOUNTER — Encounter: Payer: Medicare HMO | Admitting: Physician Assistant

## 2020-05-11 NOTE — Progress Notes (Signed)
This encounter was created in error - please disregard.

## 2021-07-30 ENCOUNTER — Inpatient Hospital Stay (HOSPITAL_COMMUNITY): Payer: Medicare HMO

## 2021-07-30 ENCOUNTER — Emergency Department (HOSPITAL_COMMUNITY): Payer: Medicare HMO

## 2021-07-30 ENCOUNTER — Inpatient Hospital Stay (HOSPITAL_COMMUNITY)
Admission: EM | Admit: 2021-07-30 | Discharge: 2021-08-14 | DRG: 064 | Disposition: A | Discharge status: Home Health | Payer: Medicare HMO | Attending: Internal Medicine | Admitting: Internal Medicine

## 2021-07-30 ENCOUNTER — Encounter (HOSPITAL_COMMUNITY): Payer: Self-pay

## 2021-07-30 DIAGNOSIS — A419 Sepsis, unspecified organism: Secondary | ICD-10-CM | POA: Diagnosis present

## 2021-07-30 DIAGNOSIS — I272 Pulmonary hypertension, unspecified: Secondary | ICD-10-CM | POA: Diagnosis present

## 2021-07-30 DIAGNOSIS — R6510 Systemic inflammatory response syndrome (SIRS) of non-infectious origin without acute organ dysfunction: Secondary | ICD-10-CM | POA: Diagnosis not present

## 2021-07-30 DIAGNOSIS — I63441 Cerebral infarction due to embolism of right cerebellar artery: Secondary | ICD-10-CM | POA: Diagnosis present

## 2021-07-30 DIAGNOSIS — I5082 Biventricular heart failure: Secondary | ICD-10-CM | POA: Diagnosis present

## 2021-07-30 DIAGNOSIS — Z833 Family history of diabetes mellitus: Secondary | ICD-10-CM

## 2021-07-30 DIAGNOSIS — R1900 Intra-abdominal and pelvic swelling, mass and lump, unspecified site: Secondary | ICD-10-CM | POA: Diagnosis not present

## 2021-07-30 DIAGNOSIS — I5043 Acute on chronic combined systolic (congestive) and diastolic (congestive) heart failure: Secondary | ICD-10-CM | POA: Diagnosis present

## 2021-07-30 DIAGNOSIS — I10 Essential (primary) hypertension: Secondary | ICD-10-CM | POA: Diagnosis not present

## 2021-07-30 DIAGNOSIS — E662 Morbid (severe) obesity with alveolar hypoventilation: Secondary | ICD-10-CM | POA: Diagnosis present

## 2021-07-30 DIAGNOSIS — Z20822 Contact with and (suspected) exposure to covid-19: Secondary | ICD-10-CM | POA: Diagnosis present

## 2021-07-30 DIAGNOSIS — E872 Acidosis, unspecified: Secondary | ICD-10-CM | POA: Diagnosis present

## 2021-07-30 DIAGNOSIS — Z79899 Other long term (current) drug therapy: Secondary | ICD-10-CM

## 2021-07-30 DIAGNOSIS — E870 Hyperosmolality and hypernatremia: Secondary | ICD-10-CM | POA: Diagnosis present

## 2021-07-30 DIAGNOSIS — Z885 Allergy status to narcotic agent status: Secondary | ICD-10-CM

## 2021-07-30 DIAGNOSIS — D72829 Elevated white blood cell count, unspecified: Secondary | ICD-10-CM | POA: Diagnosis not present

## 2021-07-30 DIAGNOSIS — R410 Disorientation, unspecified: Secondary | ICD-10-CM

## 2021-07-30 DIAGNOSIS — Z4659 Encounter for fitting and adjustment of other gastrointestinal appliance and device: Secondary | ICD-10-CM

## 2021-07-30 DIAGNOSIS — Z818 Family history of other mental and behavioral disorders: Secondary | ICD-10-CM

## 2021-07-30 DIAGNOSIS — R1310 Dysphagia, unspecified: Secondary | ICD-10-CM | POA: Diagnosis present

## 2021-07-30 DIAGNOSIS — Z8249 Family history of ischemic heart disease and other diseases of the circulatory system: Secondary | ICD-10-CM

## 2021-07-30 DIAGNOSIS — F3181 Bipolar II disorder: Secondary | ICD-10-CM | POA: Diagnosis present

## 2021-07-30 DIAGNOSIS — I071 Rheumatic tricuspid insufficiency: Secondary | ICD-10-CM | POA: Diagnosis present

## 2021-07-30 DIAGNOSIS — W06XXXA Fall from bed, initial encounter: Secondary | ICD-10-CM | POA: Diagnosis present

## 2021-07-30 DIAGNOSIS — J189 Pneumonia, unspecified organism: Secondary | ICD-10-CM

## 2021-07-30 DIAGNOSIS — A4181 Sepsis due to Enterococcus: Secondary | ICD-10-CM | POA: Diagnosis present

## 2021-07-30 DIAGNOSIS — G894 Chronic pain syndrome: Secondary | ICD-10-CM | POA: Diagnosis present

## 2021-07-30 DIAGNOSIS — N179 Acute kidney failure, unspecified: Secondary | ICD-10-CM | POA: Diagnosis present

## 2021-07-30 DIAGNOSIS — Z7189 Other specified counseling: Secondary | ICD-10-CM | POA: Diagnosis not present

## 2021-07-30 DIAGNOSIS — I160 Hypertensive urgency: Secondary | ICD-10-CM | POA: Diagnosis present

## 2021-07-30 DIAGNOSIS — R6521 Severe sepsis with septic shock: Secondary | ICD-10-CM | POA: Diagnosis present

## 2021-07-30 DIAGNOSIS — I6389 Other cerebral infarction: Secondary | ICD-10-CM | POA: Diagnosis not present

## 2021-07-30 DIAGNOSIS — Z781 Physical restraint status: Secondary | ICD-10-CM

## 2021-07-30 DIAGNOSIS — G928 Other toxic encephalopathy: Secondary | ICD-10-CM | POA: Diagnosis present

## 2021-07-30 DIAGNOSIS — Z90710 Acquired absence of both cervix and uterus: Secondary | ICD-10-CM | POA: Diagnosis not present

## 2021-07-30 DIAGNOSIS — F419 Anxiety disorder, unspecified: Secondary | ICD-10-CM | POA: Diagnosis present

## 2021-07-30 DIAGNOSIS — F329 Major depressive disorder, single episode, unspecified: Secondary | ICD-10-CM | POA: Diagnosis present

## 2021-07-30 DIAGNOSIS — R739 Hyperglycemia, unspecified: Secondary | ICD-10-CM | POA: Diagnosis not present

## 2021-07-30 DIAGNOSIS — I639 Cerebral infarction, unspecified: Secondary | ICD-10-CM | POA: Diagnosis present

## 2021-07-30 DIAGNOSIS — I110 Hypertensive heart disease with heart failure: Secondary | ICD-10-CM | POA: Diagnosis present

## 2021-07-30 DIAGNOSIS — F819 Developmental disorder of scholastic skills, unspecified: Secondary | ICD-10-CM | POA: Diagnosis present

## 2021-07-30 DIAGNOSIS — N3080 Other cystitis without hematuria: Secondary | ICD-10-CM | POA: Diagnosis present

## 2021-07-30 DIAGNOSIS — Z888 Allergy status to other drugs, medicaments and biological substances status: Secondary | ICD-10-CM

## 2021-07-30 DIAGNOSIS — R7401 Elevation of levels of liver transaminase levels: Secondary | ICD-10-CM | POA: Diagnosis not present

## 2021-07-30 DIAGNOSIS — R4182 Altered mental status, unspecified: Secondary | ICD-10-CM | POA: Diagnosis not present

## 2021-07-30 DIAGNOSIS — I69391 Dysphagia following cerebral infarction: Secondary | ICD-10-CM | POA: Diagnosis not present

## 2021-07-30 DIAGNOSIS — I5042 Chronic combined systolic (congestive) and diastolic (congestive) heart failure: Secondary | ICD-10-CM | POA: Diagnosis not present

## 2021-07-30 DIAGNOSIS — G9340 Encephalopathy, unspecified: Secondary | ICD-10-CM | POA: Diagnosis present

## 2021-07-30 DIAGNOSIS — Y92003 Bedroom of unspecified non-institutional (private) residence as the place of occurrence of the external cause: Secondary | ICD-10-CM

## 2021-07-30 DIAGNOSIS — I63432 Cerebral infarction due to embolism of left posterior cerebral artery: Secondary | ICD-10-CM | POA: Diagnosis present

## 2021-07-30 DIAGNOSIS — Z6837 Body mass index (BMI) 37.0-37.9, adult: Secondary | ICD-10-CM

## 2021-07-30 DIAGNOSIS — I428 Other cardiomyopathies: Secondary | ICD-10-CM | POA: Diagnosis present

## 2021-07-30 DIAGNOSIS — Z515 Encounter for palliative care: Secondary | ICD-10-CM | POA: Diagnosis not present

## 2021-07-30 DIAGNOSIS — E876 Hypokalemia: Secondary | ICD-10-CM | POA: Diagnosis not present

## 2021-07-30 DIAGNOSIS — Z91199 Patient's noncompliance with other medical treatment and regimen due to unspecified reason: Secondary | ICD-10-CM

## 2021-07-30 DIAGNOSIS — M6282 Rhabdomyolysis: Secondary | ICD-10-CM | POA: Diagnosis present

## 2021-07-30 DIAGNOSIS — E785 Hyperlipidemia, unspecified: Secondary | ICD-10-CM | POA: Diagnosis present

## 2021-07-30 DIAGNOSIS — Z79891 Long term (current) use of opiate analgesic: Secondary | ICD-10-CM

## 2021-07-30 DIAGNOSIS — Z880 Allergy status to penicillin: Secondary | ICD-10-CM

## 2021-07-30 DIAGNOSIS — E162 Hypoglycemia, unspecified: Secondary | ICD-10-CM | POA: Diagnosis not present

## 2021-07-30 DIAGNOSIS — R7303 Prediabetes: Secondary | ICD-10-CM | POA: Diagnosis present

## 2021-07-30 DIAGNOSIS — H5789 Other specified disorders of eye and adnexa: Secondary | ICD-10-CM | POA: Diagnosis present

## 2021-07-30 DIAGNOSIS — R29718 NIHSS score 18: Secondary | ICD-10-CM | POA: Diagnosis present

## 2021-07-30 LAB — URINALYSIS, ROUTINE W REFLEX MICROSCOPIC
Bilirubin Urine: NEGATIVE
Glucose, UA: 150 mg/dL — AB
Ketones, ur: 20 mg/dL — AB
Leukocytes,Ua: NEGATIVE
Nitrite: NEGATIVE
Protein, ur: 100 mg/dL — AB
Specific Gravity, Urine: 1.016 (ref 1.005–1.030)
pH: 7 (ref 5.0–8.0)

## 2021-07-30 LAB — COMPREHENSIVE METABOLIC PANEL
ALT: 36 U/L (ref 0–44)
AST: 107 U/L — ABNORMAL HIGH (ref 15–41)
Albumin: 4.5 g/dL (ref 3.5–5.0)
Alkaline Phosphatase: 87 U/L (ref 38–126)
Anion gap: 17 — ABNORMAL HIGH (ref 5–15)
BUN: 19 mg/dL (ref 6–20)
CO2: 23 mmol/L (ref 22–32)
Calcium: 9.6 mg/dL (ref 8.9–10.3)
Chloride: 104 mmol/L (ref 98–111)
Creatinine, Ser: 1.73 mg/dL — ABNORMAL HIGH (ref 0.44–1.00)
GFR, Estimated: 34 mL/min — ABNORMAL LOW (ref >60)
Glucose, Bld: 190 mg/dL — ABNORMAL HIGH (ref 70–99)
Potassium: 3.2 mmol/L — ABNORMAL LOW (ref 3.5–5.1)
Sodium: 144 mmol/L (ref 135–145)
Total Bilirubin: 1.7 mg/dL — ABNORMAL HIGH (ref 0.3–1.2)
Total Protein: 8.1 g/dL (ref 6.5–8.1)

## 2021-07-30 LAB — CBC WITH DIFFERENTIAL/PLATELET
Abs Immature Granulocytes: 0.1 10*3/uL — ABNORMAL HIGH (ref 0.00–0.07)
Basophils Absolute: 0.1 10*3/uL (ref 0.0–0.1)
Basophils Relative: 0 %
Eosinophils Absolute: 0 10*3/uL (ref 0.0–0.5)
Eosinophils Relative: 0 %
HCT: 47.1 % — ABNORMAL HIGH (ref 36.0–46.0)
Hemoglobin: 16.1 g/dL — ABNORMAL HIGH (ref 12.0–15.0)
Immature Granulocytes: 1 %
Lymphocytes Relative: 10 %
Lymphs Abs: 1.8 10*3/uL (ref 0.7–4.0)
MCH: 31.6 pg (ref 26.0–34.0)
MCHC: 34.2 g/dL (ref 30.0–36.0)
MCV: 92.4 fL (ref 80.0–100.0)
Monocytes Absolute: 1.2 10*3/uL — ABNORMAL HIGH (ref 0.1–1.0)
Monocytes Relative: 7 %
Neutro Abs: 14.9 10*3/uL — ABNORMAL HIGH (ref 1.7–7.7)
Neutrophils Relative %: 82 %
Platelets: 213 10*3/uL (ref 150–400)
RBC: 5.1 MIL/uL (ref 3.87–5.11)
RDW: 13.3 % (ref 11.5–15.5)
WBC: 18.1 10*3/uL — ABNORMAL HIGH (ref 4.0–10.5)
nRBC: 0 % (ref 0.0–0.2)

## 2021-07-30 LAB — ACETAMINOPHEN LEVEL: Acetaminophen (Tylenol), Serum: <10 ug/mL — ABNORMAL LOW (ref 10–30)

## 2021-07-30 LAB — RESP PANEL BY RT-PCR (FLU A&B, COVID) ARPGX2
Influenza A by PCR: NEGATIVE
Influenza B by PCR: NEGATIVE
SARS Coronavirus 2 by RT PCR: NEGATIVE

## 2021-07-30 LAB — RAPID URINE DRUG SCREEN, HOSP PERFORMED
Amphetamines: NOT DETECTED
Barbiturates: NOT DETECTED
Benzodiazepines: NOT DETECTED
Cocaine: NOT DETECTED
Opiates: NOT DETECTED
Tetrahydrocannabinol: NOT DETECTED

## 2021-07-30 LAB — CK: Total CK: 8816 U/L — ABNORMAL HIGH (ref 38–234)

## 2021-07-30 LAB — CBG MONITORING, ED: Glucose-Capillary: 189 mg/dL — ABNORMAL HIGH (ref 70–99)

## 2021-07-30 LAB — ETHANOL: Alcohol, Ethyl (B): <10 mg/dL (ref <10)

## 2021-07-30 LAB — SALICYLATE LEVEL: Salicylate Lvl: <7 mg/dL — ABNORMAL LOW (ref 7.0–30.0)

## 2021-07-30 LAB — LACTIC ACID, PLASMA
Lactic Acid, Venous: 4.5 mmol/L (ref 0.5–1.9)
Lactic Acid, Venous: 3.9 mmol/L (ref 0.5–1.9)

## 2021-07-30 MED ORDER — LABETALOL HCL 5 MG/ML IV SOLN
5.0000 mg | INTRAVENOUS | Status: DC | PRN
Start: 2021-07-30 — End: 2021-07-31
  Administered 2021-07-31 (×2): 5 mg via INTRAVENOUS
  Filled 2021-07-30 (×2): qty 4

## 2021-07-30 MED ORDER — NALOXONE HCL 0.4 MG/ML IJ SOLN: 0.4000 mg | Freq: Once | INTRAMUSCULAR | Status: DC

## 2021-07-30 MED ORDER — POTASSIUM CHLORIDE 10 MEQ/100ML IV SOLN
10.0000 meq | INTRAVENOUS | Status: AC
  Administered 2021-07-31 (×3): 10 meq via INTRAVENOUS
  Filled 2021-07-30 (×3): qty 100

## 2021-07-30 MED ORDER — NALOXONE HCL 0.4 MG/ML IJ SOLN
INTRAMUSCULAR | Status: AC
  Filled 2021-07-30: qty 1

## 2021-07-30 MED ORDER — STROKE: EARLY STAGES OF RECOVERY BOOK
Freq: Once | Status: DC
  Filled 2021-07-30: qty 1

## 2021-07-30 MED ORDER — ACETAMINOPHEN 500 MG PO TABS
1000.0000 mg | ORAL_TABLET | Freq: Once | ORAL | Status: AC
  Administered 2021-07-30: 1000 mg via ORAL
  Filled 2021-07-30: qty 2

## 2021-07-30 MED ORDER — ACETAMINOPHEN 160 MG/5ML PO SOLN
650.0000 mg | ORAL | Status: DC | PRN
  Administered 2021-08-12: 22:00:00 650 mg
  Filled 2021-07-30 (×2): qty 20.3

## 2021-07-30 MED ORDER — ACETAMINOPHEN 325 MG PO TABS
650.0000 mg | ORAL_TABLET | ORAL | Status: DC | PRN
  Filled 2021-07-30: qty 2

## 2021-07-30 MED ORDER — LACTATED RINGERS IV SOLN: INTRAVENOUS | Status: DC

## 2021-07-30 MED ORDER — SODIUM CHLORIDE 0.9 % IV BOLUS
1000.0000 mL | Freq: Once | INTRAVENOUS | Status: AC
  Administered 2021-07-30: 1000 mL via INTRAVENOUS

## 2021-07-30 MED ORDER — ACETAMINOPHEN 650 MG RE SUPP
650.0000 mg | RECTAL | Status: DC | PRN
  Administered 2021-07-31 (×2): 650 mg via RECTAL
  Filled 2021-07-30 (×3): qty 1

## 2021-07-30 MED ORDER — ASPIRIN 325 MG PO TABS: 325.0000 mg | ORAL_TABLET | Freq: Every day | ORAL | Status: DC

## 2021-07-30 MED ORDER — VANCOMYCIN HCL IN DEXTROSE 1-5 GM/200ML-% IV SOLN
1000.0000 mg | Freq: Once | INTRAVENOUS | Status: AC
  Administered 2021-07-30: 1000 mg via INTRAVENOUS
  Filled 2021-07-30: qty 200

## 2021-07-30 MED ORDER — CEFEPIME HCL 2 G IJ SOLR
2.0000 g | Freq: Once | INTRAMUSCULAR | Status: AC
  Administered 2021-07-30: 2 g via INTRAVENOUS
  Filled 2021-07-30: qty 2

## 2021-07-30 MED ORDER — NALOXONE HCL 0.4 MG/ML IJ SOLN: 0.4000 mg | Freq: Once | INTRAMUSCULAR | Status: AC

## 2021-07-30 MED ORDER — ASPIRIN 300 MG RE SUPP
300.0000 mg | Freq: Every day | RECTAL | Status: DC
  Administered 2021-07-31: 09:00:00 300 mg via RECTAL
  Filled 2021-07-30: qty 1

## 2021-07-30 MED ORDER — ENOXAPARIN SODIUM 40 MG/0.4ML IJ SOSY
40.0000 mg | PREFILLED_SYRINGE | INTRAMUSCULAR | Status: DC
  Administered 2021-07-31: 10:00:00 40 mg via SUBCUTANEOUS
  Filled 2021-07-30: qty 0.4

## 2021-07-30 MED ORDER — SODIUM CHLORIDE 0.9 % IV SOLN
1.0000 g | INTRAVENOUS | Status: DC
  Administered 2021-07-31: 08:00:00 1 g via INTRAVENOUS
  Filled 2021-07-30: qty 10

## 2021-07-30 NOTE — ED Triage Notes (Addendum)
Patient's daughter reports that the patient has been sleepy for the past 2 weeks. The daughter does not live with the patient and states the  husband works. Today, the patient is more lethargic and  altered. Patient unable to ambulate today.  Patient's husband reports that the patient has been having intermittent N/V for the past 2 weeks.

## 2021-07-30 NOTE — ED Notes (Signed)
Pt back from imaging at this time.

## 2021-07-30 NOTE — ED Notes (Signed)
EDP at the bedside to evaluate.  

## 2021-07-30 NOTE — ED Provider Notes (Signed)
[place] DEPT Provider Note   CSN: [id number] Arrival date & time: 07/30/21  1645    History Chief Complaint  Patient presents with   lethargic   Emesis   Hypertension          Pamala Hayman is a 55 y.o. female who presents to the ED today in the care of her daughter and husband with pains of AMS, lethargy, and emesis.  The family reports that the patient has been "out of it" for the past week.  Specifically, she has been lethargic and sleeping all day.  She has also been vomiting, no bile or blood noted in emesis.  Also has an example, her son came to visit her at home and was ringing the doorbell while the patient was sitting next to the door unaware that he was there.  She has not been walking around much due to lethargy.  He states that she fell out of her bed and noticed a knot on her forehead but a week ago.  She was able to converse with them somewhat yesterday but otherwise went back to sleep and did not interact with them.  Her mental status has worsened over the past few days, prompting them to bring her to the ED. Her family mentions that she switched from hydrocodone-ibuprofen regimen to oxycodone regimen a couple weeks ago and wonder if this could be contributing to her clinical status.  She has had diarrhea, only is unsure if there is blood in the stool.  No other complaints or concerns.   Emesis Associated symptoms: diarrhea   Hypertension     Past Medical History:  Diagnosis Date   Allergy    Anxiety    Arthritis    Chronic abdominal pain    Constipation    Depression    Hypertension    IBS (irritable bowel syndrome)    Learning disability    Neuromuscular disorder [facility])     Patient Active Problem List   Diagnosis Date Noted   Acute encephalopathy 07/30/2021   Acute ischemic stroke ([place]) 07/30/2021   Rhabdomyolysis 07/30/2021   SIRS (systemic inflammatory response syndrome) ([place]) 07/30/2021   Acute CHF (congestive heart  failure) ([place]) 05/01/2020   AKI (acute kidney injury) ([place]) 05/01/2020   BMI 37.0-37.9, adult 05/01/2020   Benzodiazepine dependence ([place]) 10/22/2018   Bipolar II disorder (HCC)    MDD (major depressive disorder), recurrent episode, severe ([place]) 10/20/2018   Learning disability 06/25/2015   Illiterate 06/25/2015   Adjustment disorder with depressed mood 06/24/2014   Stress reaction causing mixed disturbance of emotion and conduct    Opiate dependence, continuous ([place]) 06/16/2014   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   GAD (generalized anxiety disorder) 06/16/2014   Major depressive disorder, recurrent, severe without psychotic features ([place]) 06/16/2014   Major depressive disorder, recurrent episode, moderate ([place]) 04/13/2014   Insomnia 04/13/2014   DYSPNEA 07/05/2009   VAGINITIS 07/04/2009   OVARIAN CYST 07/04/2009   Essential hypertension 07/03/2009   CARDIOMEGALY 07/03/2009   PLEURAL EFFUSION 07/03/2009   DIVERTICULITIS, COLON 07/03/2009   PERSONAL HX COLONIC POLYPS 07/03/2009    Past Surgical History:  Procedure Laterality Date   ABDOMINAL HYSTERECTOMY     GALLBLADDER SURGERY     KNEE SURGERY     RIGHT/LEFT HEART CATH AND CORONARY ANGIOGRAPHY N/A 05/04/2020   Procedure: RIGHT/LEFT HEART CATH AND CORONARY ANGIOGRAPHY;  Surgeon: Belva Crome, MD;  Location: [facility];  Service: Cardiovascular;  Laterality: N/A;  OB History   No obstetric history on file.     Family History  Problem Relation Age of Onset   Diabetes Mother    Suicidality Mother    Heart disease Father    Depression Father     Social History   Tobacco Use   Smoking status: Never   Smokeless tobacco: Never  Vaping Use   Vaping Use: Never used  Substance Use Topics   Alcohol use: No   Drug use: Yes    Types: Other-see comments, Marijuana    Comment: opiates    Home Medications Prior to Admission medications   Medication Sig Start Date End Date Taking?  Authorizing Provider  carvedilol (COREG) 3.125 MG tablet Take 1 tablet (3.125 mg total) by mouth 2 (two) times daily with a meal. 05/04/20   Florencia Reasons, MD  furosemide (LASIX) 80 MG tablet Take 1 tablet (80 mg total) by mouth daily. 05/04/20 06/03/20  Florencia Reasons, MD  hydrALAZINE (APRESOLINE) 25 MG tablet Take 3 tablets (75 mg total) by mouth 3 (three) times daily. 05/04/20 06/03/20  Florencia Reasons, MD  isosorbide mononitrate (IMDUR) 30 MG 24 hr tablet Take 0.5 tablets (15 mg total) by mouth 2 (two) times daily. 05/04/20   Florencia Reasons, MD  potassium chloride SA (KLOR-CON) 20 MEQ tablet Take 1 tablet (20 mEq total) by mouth daily for 7 days. 05/04/20 05/11/20  Florencia Reasons, MD  QUEtiapine (SEROQUEL) 300 MG tablet Take 600 mg by mouth at bedtime. 03/17/20   [provider]  sacubitril-valsartan (ENTRESTO) 49-51 MG Take 1 tablet by mouth 2 (two) times daily. If prior authorization needed ,please fax  request to [facility] cardiology Dr Victorino December office 05/04/20   Florencia Reasons, MD    Allergies    Ketorolac, Morphine and related, Penicillins, and Trazodone and nefazodone  Review of Systems   Review of Systems  Constitutional:  Positive for fatigue.  HENT: Negative.    Eyes: Negative.   Respiratory: Negative.    Cardiovascular: Negative.   Gastrointestinal:  Positive for diarrhea and vomiting.  Endocrine: Negative.   Genitourinary: Negative.   Musculoskeletal:  Positive for gait problem.  Skin: Negative.   Allergic/Immunologic: Negative.   Hematological: Negative.   Psychiatric/Behavioral:  Positive for confusion.    Physical Exam Updated Vital Signs BP (!) 193/131    Pulse (!) 113    Temp 99.2 F (37.3 C) (Rectal)    Resp (!) 27    SpO2 97%   Physical Exam Constitutional:      Comments: Lethargic  HENT:     Head: Normocephalic and atraumatic.  Eyes:     Pupils: Pupils are equal, round, and reactive to light.     Comments: Pupils dilated  Cardiovascular:     Rate and Rhythm: Normal rate and regular  rhythm.     Heart sounds: No murmur heard.   No friction rub. No gallop.  Pulmonary:     Effort: Pulmonary effort is normal.     Breath sounds: Normal breath sounds. No wheezing, rhonchi or rales.  Abdominal:     General: Abdomen is flat. There is no distension.  Skin:    General: Skin is warm and dry.  Neurological:     Mental Status: She is alert. She is disoriented.     Comments: Neuro exam limited in the setting of AMS    ED Results / Procedures / Treatments   Labs (all labs ordered are listed, but only abnormal results are displayed) Labs Reviewed  CBC WITH DIFFERENTIAL/PLATELET - Abnormal; Notable for the following components:      Result Value   WBC 18.1 (*)    Hemoglobin 16.1 (*)    HCT 47.1 (*)    Neutro Abs 14.9 (*)    Monocytes Absolute 1.2 (*)    Abs Immature Granulocytes 0.10 (*)    All other components within normal limits  COMPREHENSIVE METABOLIC PANEL - Abnormal; Notable for the following components:   Potassium 3.2 (*)    Glucose, Bld 190 (*)    Creatinine, Ser 1.73 (*)    AST 107 (*)    Total Bilirubin 1.7 (*)    GFR, Estimated 34 (*)    Anion gap 17 (*)    All other components within normal limits  LACTIC ACID, PLASMA - Abnormal; Notable for the following components:   Lactic Acid, Venous 4.5 (*)    All other components within normal limits  LACTIC ACID, PLASMA - Abnormal; Notable for the following components:   Lactic Acid, Venous 3.9 (*)    All other components within normal limits  SALICYLATE LEVEL - Abnormal; Notable for the following components:   Salicylate Lvl <2.9 (*)    All other components within normal limits  ACETAMINOPHEN LEVEL - Abnormal; Notable for the following components:   Acetaminophen (Tylenol), Serum <10 (*)    All other components within normal limits  CK - Abnormal; Notable for the following components:   Total CK 8,816 (*)    All other components within normal limits  URINALYSIS, ROUTINE W REFLEX MICROSCOPIC - Abnormal;  Notable for the following components:   APPearance HAZY (*)    Glucose, UA 150 (*)    Hgb urine dipstick MODERATE (*)    Ketones, ur 20 (*)    Protein, ur 100 (*)    Bacteria, UA RARE (*)    All other components within normal limits  CBG MONITORING, ED - Abnormal; Notable for the following components:   Glucose-Capillary 189 (*)    All other components within normal limits  RESP PANEL BY RT-PCR (FLU A&B, COVID) ARPGX2  CULTURE, BLOOD (ROUTINE X 2)  CULTURE, BLOOD (ROUTINE X 2)  URINE CULTURE  MRSA NEXT GEN BY PCR, NASAL  ETHANOL  RAPID URINE DRUG SCREEN, HOSP PERFORMED  HIV ANTIBODY (ROUTINE TESTING W REFLEX)  HEMOGLOBIN A1C  LIPID PANEL  LACTIC ACID, PLASMA  LACTIC ACID, PLASMA  PROCALCITONIN  CK  CBC  COMPREHENSIVE METABOLIC PANEL    EKG EKG Interpretation  Date/Time:  Tuesday July 30 2021 17:14:20 EST Ventricular Rate:  116 PR Interval:  102 QRS Duration: 85 QT Interval:  318 QTC Calculation: 442 R Axis:   9 Text Interpretation: Sinus tachycardia Probable LVH with secondary repol abnrm No significant change since last tracing Confirmed by Wandra Arthurs [phone]) on 07/30/2021 5:28:33 PM  Radiology DG Chest 2 View  Result Date: 07/30/2021 CLINICAL DATA:  Altered level of con gaseousness, sleepiness, lethargy, tachycardia, intermittent nausea and vomiting for 2 weeks, history hypertension EXAM: CHEST - 2 VIEW COMPARISON:  05/01/2020 FINDINGS: Enlargement of cardiac silhouette with pulmonary vascular congestion. Mediastinal contours normal. Lungs clear. No acute infiltrate, pleural effusion, or pneumothorax. No focal osseous abnormalities. IMPRESSION: Enlargement of cardiac silhouette with pulmonary vascular congestion. No acute abnormalities. Electronically Signed   By: Lavonia Dana M.D.   On: 07/30/2021 17:59   CT HEAD WO CONTRAST (5MM)  Result Date: 07/30/2021 CLINICAL DATA:  Lethargy. EXAM: CT HEAD WITHOUT CONTRAST CT CERVICAL SPINE WITHOUT CONTRAST TECHNIQUE:  Multidetector CT imaging of the head and cervical spine was performed following the standard protocol without intravenous contrast. Multiplanar CT image reconstructions of the cervical spine were also generated. COMPARISON:  None. FINDINGS: CT HEAD FINDINGS Brain: Subacute to chronic right occipital lobe infarct. No evidence for acute hemorrhage, hydrocephalus, or abnormal extra-axial fluid collection. Patchy low attenuation in the deep hemispheric and periventricular white matter is nonspecific, but likely reflects chronic microvascular ischemic demyelination. Vascular: No hyperdense vessel or unexpected calcification. Skull: No evidence for fracture. No worrisome lytic or sclerotic lesion. Sinuses/Orbits: The visualized paranasal sinuses and mastoid air cells are clear. Visualized portions of the globes and intraorbital fat are unremarkable. Other: None. CT CERVICAL SPINE FINDINGS Alignment: Straightening of normal cervical lordosis. Skull base and vertebrae: Lower cervical spine partially obscured by motion artifact. Within this limitation, there is no evidence for acute fracture. Soft tissues and spinal canal: No prevertebral fluid or swelling. No visible canal hematoma. Disc levels:  Loss of disc height noted C5-6, C6-7, and C7-T1. Upper chest: Unremarkable. Other: None. IMPRESSION: 1. Probable chronic right occipital lobe infarct although subacute timing is a possibility. Otherwise, no acute intracranial findings. 2. Degenerative changes in the cervical spine without evidence for an acute fracture. 3. Loss of cervical lordosis. This can be related to patient positioning, muscle spasm or soft tissue injury. Electronically Signed   By: Misty Stanley M.D.   On: 07/30/2021 18:57   CT Cervical Spine Wo Contrast  Result Date: 07/30/2021 CLINICAL DATA:  Lethargy. EXAM: CT HEAD WITHOUT CONTRAST CT CERVICAL SPINE WITHOUT CONTRAST TECHNIQUE: Multidetector CT imaging of the head and cervical spine was performed  following the standard protocol without intravenous contrast. Multiplanar CT image reconstructions of the cervical spine were also generated. COMPARISON:  None. FINDINGS: CT HEAD FINDINGS Brain: Subacute to chronic right occipital lobe infarct. No evidence for acute hemorrhage, hydrocephalus, or abnormal extra-axial fluid collection. Patchy low attenuation in the deep hemispheric and periventricular white matter is nonspecific, but likely reflects chronic microvascular ischemic demyelination. Vascular: No hyperdense vessel or unexpected calcification. Skull: No evidence for fracture. No worrisome lytic or sclerotic lesion. Sinuses/Orbits: The visualized paranasal sinuses and mastoid air cells are clear. Visualized portions of the globes and intraorbital fat are unremarkable. Other: None. CT CERVICAL SPINE FINDINGS Alignment: Straightening of normal cervical lordosis. Skull base and vertebrae: Lower cervical spine partially obscured by motion artifact. Within this limitation, there is no evidence for acute fracture. Soft tissues and spinal canal: No prevertebral fluid or swelling. No visible canal hematoma. Disc levels:  Loss of disc height noted C5-6, C6-7, and C7-T1. Upper chest: Unremarkable. Other: None. IMPRESSION: 1. Probable chronic right occipital lobe infarct although subacute timing is a possibility. Otherwise, no acute intracranial findings. 2. Degenerative changes in the cervical spine without evidence for an acute fracture. 3. Loss of cervical lordosis. This can be related to patient positioning, muscle spasm or soft tissue injury. Electronically Signed   By: Misty Stanley M.D.   On: 07/30/2021 18:57   CT Renal Stone Study  Result Date: 07/30/2021 CLINICAL DATA:  Intermittent nausea and vomiting for 2 weeks EXAM: CT ABDOMEN AND PELVIS WITHOUT CONTRAST TECHNIQUE: Multidetector CT imaging of the abdomen and pelvis was performed following the standard protocol without IV contrast. COMPARISON:   10/11/2012 FINDINGS: Lower chest: No acute pleural or parenchymal lung disease. Hepatobiliary: Unremarkable unenhanced appearance of the liver and gallbladder. Pancreas: Unremarkable unenhanced appearance. Spleen: Unremarkable unenhanced appearance. Adrenals/Urinary Tract: There is a 4.1 by 4.7 by 3.2 cm  rounded structure interposed between the stomach and ventral aspect upper pole left kidney. Given location, this could reflect gastric diverticulum, pancreatic tail mass, or exophytic renal mass. Nonemergent follow-up abdominal CT with IV and oral contrast is recommended. No urinary tract calculi or obstructive uropathy within either kidney. The adrenals are unremarkable. Intramural gas is seen throughout the bladder consistent with emphysematous cystitis. Stomach/Bowel: No bowel obstruction or ileus. Normal appendix right lower quadrant. Sigmoid diverticulosis without diverticulitis. No bowel wall thickening or inflammatory change. Vascular/Lymphatic: Aortic atherosclerosis. No enlarged abdominal or pelvic lymph nodes. Reproductive: Status post hysterectomy. No adnexal masses. Other: No free fluid or free gas.  No abdominal wall hernia. Musculoskeletal: No acute or destructive bony lesions. Reconstructed images demonstrate no additional findings. IMPRESSION: 1. Emphysematous cystitis. 2. 4.7 cm rounded soft tissue structure interposed between the gastric fundus and the upper pole left kidney. Given location, gastric diverticulum, pancreatic tail mass, or exophytic renal mass could give this appearance. Nonemergent follow-up CT of the abdomen with IV and oral contrast is recommended. 3. Sigmoid diverticulosis without diverticulitis. 4.  Aortic Atherosclerosis (ICD10-I70.0). Critical Value/emergent results were called by telephone at the time of interpretation on 07/30/2021 at 11:42 pm to provider DR Lorin Glass, who verbally acknowledged these results. Electronically Signed   By: Randa Ngo M.D.   On: 07/30/2021  23:42    Procedures Procedures   Medications Ordered in ED Medications  naloxone (NARCAN) injection 0.4 mg (0.4 mg Intravenous Not Given 07/30/21 1715)  lactated ringers infusion (has no administration in time range)   stroke: mapping our early stages of recovery book (has no administration in time range)  acetaminophen (TYLENOL) tablet 650 mg (has no administration in time range)    Or  acetaminophen (TYLENOL) 160 MG/5ML solution 650 mg (has no administration in time range)    Or  acetaminophen (TYLENOL) suppository 650 mg (has no administration in time range)  enoxaparin (LOVENOX) injection 40 mg (has no administration in time range)  aspirin suppository 300 mg (has no administration in time range)    Or  aspirin tablet 325 mg (has no administration in time range)  labetalol (NORMODYNE) injection 5-10 mg (has no administration in time range)  potassium chloride 10 mEq in 100 mL IVPB (has no administration in time range)  cefTRIAXone (ROCEPHIN) 1 g in sodium chloride 0.9 % 100 mL IVPB (has no administration in time range)  naloxone [facility]) injection 0.4 mg ( Intramuscular Given 07/30/21 1708)  sodium chloride 0.9 % bolus 1,000 mL (0 mLs Intravenous Stopped 07/30/21 2132)  acetaminophen (TYLENOL) tablet 1,000 mg (1,000 mg Oral Given 07/30/21 1908)  vancomycin (VANCOCIN) IVPB 1000 mg/200 mL premix (0 mg Intravenous Stopped 07/30/21 2133)  sodium chloride 0.9 % bolus 1,000 mL (0 mLs Intravenous Stopped 07/30/21 2132)  ceFEPIme (MAXIPIME) 2 g in sodium chloride 0.9 % 100 mL IVPB (0 g Intravenous Stopped 07/30/21 2020)    ED Course  I have reviewed the triage vital signs and the nursing notes.  Pertinent labs & imaging results that were available during my care of the patient were reviewed by me and considered in my medical decision making (see chart for details).    MDM Rules/Calculators/A&P                          This is a 55 year old female who presented to the ED in the care  of her daughter and husband for AMS for the past 1-2 weeks.  Patient was given  Narcan prior to arrival due to concern for opioid overdose. On arrival, temp 100.9, BP 187/135, tachycardic at 117.    Pt is without meningeal signs on exam. Resp panel unremarkable, CK 8816, CBG 189, WBC 18, creatinine at 1.73, compared to 1.19 one year ago.  Lactic acidosis 4.5, 3.9. CT head without contrast showed probable chronic right occipital lobe infarct although subacute timing is a possibility.  Chest x-ray is remarkable for enlargement of cardiac silhouette with pulmonary vascular congestion, but no other acute findings. UA showed hemoglobin, RBCs, rare bacteria, UDS negative.   As part of our work-up to find infectious source, CT renal stone study was obtained.  There are no stones though her imaging is concerning for emphysematous cystitis.  LP is not warranted at this time.  Patient will require admission to the hospital for her management of AKI, stroke, rhabdo, and emphysematous cystitis.  We have consulted hospitalist who will admit the patient for further work-up and management. We have spoken with urology for their recs for tx with broad-spectrum antibiotics and catheterization.     Final Clinical Impression(s) / ED Diagnoses Final diagnoses:  Sepsis, due to unspecified organism, unspecified whether acute organ dysfunction present [facility])  Confusion    Rx / DC Orders ED Discharge Orders     None        Orvis Brill, MD 07/30/21 2352    Drenda Freeze, MD 08/04/21 1459

## 2021-07-30 NOTE — Progress Notes (Addendum)
CT shows emphysematous cystitis, also incidental 4.7cm soft tissue mass: 1) ordering rocephin 2) UCx already in process 3) needs follow up CT imaging of that incidental mass with contrast once renal function improved.

## 2021-07-30 NOTE — ED Notes (Signed)
Patient transported to MRI 

## 2021-07-30 NOTE — ED Provider Notes (Signed)
Emergency Medicine Provider Triage Evaluation Note  Geneive Sandstrom , a 55 y.o. female  was evaluated in triage.  Pt brought in by daughter -- patient more lethargic, with altered mental status, confusion. Recently nauseous, vomiting for several weeks. Daughter worried that she may be taking her medications inappropriately. Hx heart failure, learning disability, anxiety, depression.  Review of Systems  Positive:  Negative:   Physical Exam  BP (!) 192/127    Pulse (!) 120    SpO2 97%  Gen:   Awake, no distress   Resp:  Normal effort  MSK:   Moves extremities without difficulty  Other:  Patient only responsive to sternal rub, alert to shouted voice, tachycardia, pupils are not pinpoint  Medical Decision Making  Medically screening exam initiated at 5:05 PM.  Appropriate orders placed.  [name] was informed that the remainder of the evaluation will be completed by another provider, this initial triage assessment does not replace that evaluation, and the importance of remaining in the ED until their evaluation is complete.  Patient taken straight to resusc, told to admin narcan 0.4mg  to start, hx of inappropriate use of opioids   Anselmo Pickler, PA-C 07/30/21 1708    Drenda Freeze, MD 07/30/21 [phone]

## 2021-07-30 NOTE — ED Notes (Signed)
Lactic acid 4.5. Received from lab. EDP notified.

## 2021-07-30 NOTE — H&P (Addendum)
History and Physical    Ajooni Karam GEX:[id number] DOB: Jan 18, 1966 DOA: 07/30/2021  PCP: Nolene Ebbs, MD  Patient coming from: Home  I have personally briefly reviewed patient's old medical records in [place]  Chief Complaint: AMS  HPI: Dorothy Alvarez is a 55 y.o. female with medical history significant of HTN, chronic pain syndrome on opiates.  Pt recently switched from hydrocodone-ibuprofen regimen to oxycodone regimen a couple of weeks ago for chronic pain.  Pt in to ED with lethargy and confusion.  Onset over the past 1 week.  Today she has been lethargic and sleeping all day, though family has noticed AMS for the past week.  Examples: son came to visit her at home and was ringing the doorbell while the patient was sitting next to the door unaware that he was there.  He states that she fell out of her bed and noticed a knot on her forehead but a week ago.  She was able to converse with them somewhat yesterday but otherwise went back to sleep and did not interact with them.  Her mental status has worsened over the past few days, prompting them to bring her to the ED.  Pt has diarrhea.  Pt denies: CP, headache, fever, muscle aches.   ED Course:  1) fever to 100.9 2) S.Tach to 120 3) Hypertensive with BPs as high as 205/146 4) WBC 18.1k 5) AKI with creat 1.73 6) Lactate 4.5, improves to 3.9 after IVF 7) CT head shows subacute vs chronic occipital infarct 8) Rhabdo with CPK of 8000  CXR neg   Review of Systems: ROS limited due to pt AMS.  Past Medical History:  Diagnosis Date   Allergy    Anxiety    Arthritis    Chronic abdominal pain    Constipation    Depression    Hypertension    IBS (irritable bowel syndrome)    Learning disability    Neuromuscular disorder (HCC)     Past Surgical History:  Procedure Laterality Date   ABDOMINAL HYSTERECTOMY     GALLBLADDER SURGERY     KNEE SURGERY     RIGHT/LEFT HEART CATH AND CORONARY ANGIOGRAPHY N/A  05/04/2020   Procedure: RIGHT/LEFT HEART CATH AND CORONARY ANGIOGRAPHY;  Surgeon: Belva Crome, MD;  Location: [facility];  Service: Cardiovascular;  Laterality: N/A;     reports that she has never smoked. She has never used smokeless tobacco. She reports current drug use. Drugs: Other-see comments and Marijuana. She reports that she does not drink alcohol.  Allergies  Allergen Reactions   Ketorolac Other (See Comments)    Anxiety and confusion   Morphine And Related Other (See Comments)    Mental status changes (confusion and anxiety)   Penicillins Itching    Has patient had a PCN reaction causing immediate rash, facial/tongue/throat swelling, SOB or lightheadedness with hypotension: Yes Has patient had a PCN reaction causing severe rash involving mucus membranes or skin necrosis: No Has patient had a PCN reaction that required hospitalization No Has patient had a PCN reaction occurring within the last 10 years: No If all of the above answers are "NO", then may proceed with Cephalosporin use.    Trazodone And Nefazodone     Restless and opposite effect.     Family History  Problem Relation Age of Onset   Diabetes Mother    Suicidality Mother    Heart disease Father    Depression Father      Prior  to Admission medications   Medication Sig Start Date End Date Taking? Authorizing Provider  carvedilol (COREG) 3.125 MG tablet Take 1 tablet (3.125 mg total) by mouth 2 (two) times daily with a meal. 05/04/20   Florencia Reasons, MD  furosemide (LASIX) 80 MG tablet Take 1 tablet (80 mg total) by mouth daily. 05/04/20 06/03/20  Florencia Reasons, MD  hydrALAZINE (APRESOLINE) 25 MG tablet Take 3 tablets (75 mg total) by mouth 3 (three) times daily. 05/04/20 06/03/20  Florencia Reasons, MD  isosorbide mononitrate (IMDUR) 30 MG 24 hr tablet Take 0.5 tablets (15 mg total) by mouth 2 (two) times daily. 05/04/20   Florencia Reasons, MD  potassium chloride SA (KLOR-CON) 20 MEQ tablet Take 1 tablet (20 mEq total) by mouth  daily for 7 days. 05/04/20 05/11/20  Florencia Reasons, MD  QUEtiapine (SEROQUEL) 300 MG tablet Take 600 mg by mouth at bedtime. 03/17/20   [provider]  sacubitril-valsartan (ENTRESTO) 49-51 MG Take 1 tablet by mouth 2 (two) times daily. If prior authorization needed ,please fax  request to [facility] cardiology Dr Croitoru's office 05/04/20   Florencia Reasons, MD    Physical Exam: Vitals:   07/30/21 2200 07/30/21 2215 07/30/21 2230 07/30/21 2245  BP: (!) 190/123 (!) 171/113 (!) 186/134 (!) 193/131  Pulse: (!) 115 (!) 112 (!) 115 (!) 113  Resp: (!) 23 (!) 22 (!) 27 (!) 27  Temp:      TempSrc:      SpO2: 96% 97% 97% 97%    Constitutional: Lethargic Eyes: Pupils dilated ENMT: Mucous membranes are moist. Posterior pharynx clear of any exudate or lesions.Normal dentition.  Neck: normal, supple, no masses, no thyromegaly Respiratory: clear to auscultation bilaterally, no wheezing, no crackles. Normal respiratory effort. No accessory muscle use.  Cardiovascular: Tachycardic Abdomen: no tenderness, no masses palpated. No hepatosplenomegaly. Bowel sounds positive.  Musculoskeletal: no clubbing / cyanosis. No joint deformity upper and lower extremities. Good ROM, no contractures. Normal muscle tone.  Skin: no rashes, lesions, ulcers. No induration Neurologic: Generalized weakness, but not appreciating any focal weakness.  Pt lethargic though.  Not really able to test visual changes, etc. Psychiatric: Oriented to self only   Labs on Admission: I have personally reviewed following labs and imaging studies  CBC: Recent Labs  Lab 07/30/21 1712  WBC 18.1*  NEUTROABS 14.9*  HGB 16.1*  HCT 47.1*  MCV 92.4  PLT 397   Basic Metabolic Panel: Recent Labs  Lab 07/30/21 1712  NA 144  K 3.2*  CL 104  CO2 23  GLUCOSE 190*  BUN 19  CREATININE 1.73*  CALCIUM 9.6   GFR: CrCl cannot be calculated (Unknown ideal weight.). Liver Function Tests: Recent Labs  Lab 07/30/21 1712  AST 107*  ALT 36   ALKPHOS 87  BILITOT 1.7*  PROT 8.1  ALBUMIN 4.5   No results for input(s): LIPASE, AMYLASE in the last 168 hours. No results for input(s): AMMONIA in the last 168 hours. Coagulation Profile: No results for input(s): INR, PROTIME in the last 168 hours. Cardiac Enzymes: Recent Labs  Lab 07/30/21 1712  CKTOTAL 8,816*   BNP (last 3 results) No results for input(s): PROBNP in the last 8760 hours. HbA1C: No results for input(s): HGBA1C in the last 72 hours. CBG: Recent Labs  Lab 07/30/21 1654  GLUCAP 189*   Lipid Profile: No results for input(s): CHOL, HDL, LDLCALC, TRIG, CHOLHDL, LDLDIRECT in the last 72 hours. Thyroid Function Tests: No results for input(s): TSH, T4TOTAL, FREET4, T3FREE, THYROIDAB  in the last 72 hours. Anemia Panel: No results for input(s): VITAMINB12, FOLATE, FERRITIN, TIBC, IRON, RETICCTPCT in the last 72 hours. Urine analysis:    Component Value Date/Time   COLORURINE YELLOW 07/30/2021 2046   APPEARANCEUR HAZY (A) 07/30/2021 2046   LABSPEC 1.016 07/30/2021 2046   PHURINE 7.0 07/30/2021 2046   GLUCOSEU 150 (A) 07/30/2021 2046   HGBUR MODERATE (A) 07/30/2021 2046   BILIRUBINUR NEGATIVE 07/30/2021 2046   BILIRUBINUR negative 05/08/2015 1540   KETONESUR 20 (A) 07/30/2021 2046   PROTEINUR 100 (A) 07/30/2021 2046   UROBILINOGEN 0.2 05/08/2015 1540   UROBILINOGEN 1.0 03/01/2015 2017   NITRITE NEGATIVE 07/30/2021 2046   LEUKOCYTESUR NEGATIVE 07/30/2021 2046    Radiological Exams on Admission: DG Chest 2 View  Result Date: 07/30/2021 CLINICAL DATA:  Altered level of con gaseousness, sleepiness, lethargy, tachycardia, intermittent nausea and vomiting for 2 weeks, history hypertension EXAM: CHEST - 2 VIEW COMPARISON:  05/01/2020 FINDINGS: Enlargement of cardiac silhouette with pulmonary vascular congestion. Mediastinal contours normal. Lungs clear. No acute infiltrate, pleural effusion, or pneumothorax. No focal osseous abnormalities. IMPRESSION:  Enlargement of cardiac silhouette with pulmonary vascular congestion. No acute abnormalities. Electronically Signed   By: Lavonia Dana M.D.   On: 07/30/2021 17:59   CT HEAD WO CONTRAST (5MM)  Result Date: 07/30/2021 CLINICAL DATA:  Lethargy. EXAM: CT HEAD WITHOUT CONTRAST CT CERVICAL SPINE WITHOUT CONTRAST TECHNIQUE: Multidetector CT imaging of the head and cervical spine was performed following the standard protocol without intravenous contrast. Multiplanar CT image reconstructions of the cervical spine were also generated. COMPARISON:  None. FINDINGS: CT HEAD FINDINGS Brain: Subacute to chronic right occipital lobe infarct. No evidence for acute hemorrhage, hydrocephalus, or abnormal extra-axial fluid collection. Patchy low attenuation in the deep hemispheric and periventricular white matter is nonspecific, but likely reflects chronic microvascular ischemic demyelination. Vascular: No hyperdense vessel or unexpected calcification. Skull: No evidence for fracture. No worrisome lytic or sclerotic lesion. Sinuses/Orbits: The visualized paranasal sinuses and mastoid air cells are clear. Visualized portions of the globes and intraorbital fat are unremarkable. Other: None. CT CERVICAL SPINE FINDINGS Alignment: Straightening of normal cervical lordosis. Skull base and vertebrae: Lower cervical spine partially obscured by motion artifact. Within this limitation, there is no evidence for acute fracture. Soft tissues and spinal canal: No prevertebral fluid or swelling. No visible canal hematoma. Disc levels:  Loss of disc height noted C5-6, C6-7, and C7-T1. Upper chest: Unremarkable. Other: None. IMPRESSION: 1. Probable chronic right occipital lobe infarct although subacute timing is a possibility. Otherwise, no acute intracranial findings. 2. Degenerative changes in the cervical spine without evidence for an acute fracture. 3. Loss of cervical lordosis. This can be related to patient positioning, muscle spasm or soft  tissue injury. Electronically Signed   By: Misty Stanley M.D.   On: 07/30/2021 18:57   CT Cervical Spine Wo Contrast  Result Date: 07/30/2021 CLINICAL DATA:  Lethargy. EXAM: CT HEAD WITHOUT CONTRAST CT CERVICAL SPINE WITHOUT CONTRAST TECHNIQUE: Multidetector CT imaging of the head and cervical spine was performed following the standard protocol without intravenous contrast. Multiplanar CT image reconstructions of the cervical spine were also generated. COMPARISON:  None. FINDINGS: CT HEAD FINDINGS Brain: Subacute to chronic right occipital lobe infarct. No evidence for acute hemorrhage, hydrocephalus, or abnormal extra-axial fluid collection. Patchy low attenuation in the deep hemispheric and periventricular white matter is nonspecific, but likely reflects chronic microvascular ischemic demyelination. Vascular: No hyperdense vessel or unexpected calcification. Skull: No evidence for fracture. No worrisome lytic  or sclerotic lesion. Sinuses/Orbits: The visualized paranasal sinuses and mastoid air cells are clear. Visualized portions of the globes and intraorbital fat are unremarkable. Other: None. CT CERVICAL SPINE FINDINGS Alignment: Straightening of normal cervical lordosis. Skull base and vertebrae: Lower cervical spine partially obscured by motion artifact. Within this limitation, there is no evidence for acute fracture. Soft tissues and spinal canal: No prevertebral fluid or swelling. No visible canal hematoma. Disc levels:  Loss of disc height noted C5-6, C6-7, and C7-T1. Upper chest: Unremarkable. Other: None. IMPRESSION: 1. Probable chronic right occipital lobe infarct although subacute timing is a possibility. Otherwise, no acute intracranial findings. 2. Degenerative changes in the cervical spine without evidence for an acute fracture. 3. Loss of cervical lordosis. This can be related to patient positioning, muscle spasm or soft tissue injury. Electronically Signed   By: Misty Stanley M.D.   On:  07/30/2021 18:57    EKG: Independently reviewed.  Assessment/Plan Principal Problem:   Acute encephalopathy Active Problems:   Essential hypertension   AKI (acute kidney injury) ([place])   Acute ischemic stroke (HCC)   Rhabdomyolysis   SIRS (systemic inflammatory response syndrome) (HCC)    Acute encephalopathy - DDx of cause includes: Delirium from SIRS, rhabdo, AKI PRES Stroke Substance seems less likely: not reversed with narcan and UDS just came back negative. Seizure also seems unlikely given that shes following commands Stroke - Subacute vs chronic occipital infarct, not clear which Stroke pathway Get MRI brain Tele monitor Neuro to see in consult when she gets over to [facility] for now 2d echo PT/OT/SLP HTN - Hold home BP meds PRN labetalol for SBP > 220 or DBP > 120, allowing permissive HTN at least until acute stroke can be ruled out with MRI Rhabdomyolysis - IVF: 2L NS bolus then LR at 150 Strict intake and output Daily BMP Repeat CPK in AM SIRS - Got cefepime + vanc doses in ED Serial lactates Unclear source Checking non-contrast CT of ABD/pelvis No meningismus, EDP felt pt didn't need LP Procalcitonin pending BCx pending Re-order ABx depending on findings, will hold off for the moment.  DVT prophylaxis: Lovenox Code Status: Full Family Communication: No family in room Disposition Plan: TBD Consults called: EDP spoke with neurology Admission status: Admit to inpatient  Severity of Illness: The appropriate patient status for this patient is INPATIENT. Inpatient status is judged to be reasonable and necessary in order to provide the required intensity of service to ensure the patient's safety. The patient's presenting symptoms, physical exam findings, and initial radiographic and laboratory data in the context of their chronic comorbidities is felt to place them at high risk for further clinical deterioration. Furthermore, it is not anticipated that the  patient will be medically stable for discharge from the hospital within 2 midnights of admission.   * I certify that at the point of admission it is my clinical judgment that the patient will require inpatient hospital care spanning beyond 2 midnights from the point of admission due to high intensity of service, high risk for further deterioration and high frequency of surveillance required.*   Tine Mabee M. DO [facility]  How to contact the [facility] Attending or Consulting provider [place] or covering provider during after hours [place], for this patient?  Check the care team in [facility] and look for a) attending/consulting [facility] provider listed and b) the [facility] team listed Log into [email or web address]  Amion Physician Scheduling and messaging for groups and whole hospitals  On call and physician scheduling software for group practices, residents, hospitalists and other medical providers for call, clinic, rotation and shift schedules. On[employer] is a hospital-wide system for scheduling doctors and paging doctors on call. EasyPlot is for scientific plotting and data analysis.  [email or web address]  and use [place]'s universal password to access. If you do not have the password, please contact the hospital operator.  Locate the [facility] provider you are looking for under [facility] and page to a number that you can be directly reached. If you still have difficulty reaching the provider, please page the [facility] (Director on Call) for the Hospitalists listed on amion for assistance.  07/30/2021, 11:11 PM

## 2021-07-30 NOTE — ED Notes (Addendum)
Rectal temp obtained by ED techs 100.34F rectally. EDP notified and aware.

## 2021-07-30 NOTE — ED Notes (Signed)
Pt emergently brought back to resus rooms, no line available and provider requested Narcan, given IM 0.4 mg to R deltoid.

## 2021-07-30 NOTE — Progress Notes (Signed)
A consult was received from an ED physician for cefepime per pharmacy dosing.  The patient's profile has been reviewed for ht/wt/allergies/indication/available labs.    A one time order has been placed for cefepime 2gm IV x1.  Further antibiotics/pharmacy consults should be ordered by admitting physician if indicated.                       Thank you, Lynelle Doctor 07/30/2021  7:03 PM

## 2021-07-31 ENCOUNTER — Inpatient Hospital Stay (HOSPITAL_COMMUNITY): Payer: Medicare HMO

## 2021-07-31 DIAGNOSIS — I6389 Other cerebral infarction: Secondary | ICD-10-CM

## 2021-07-31 DIAGNOSIS — I5043 Acute on chronic combined systolic (congestive) and diastolic (congestive) heart failure: Secondary | ICD-10-CM | POA: Diagnosis present

## 2021-07-31 DIAGNOSIS — G9340 Encephalopathy, unspecified: Secondary | ICD-10-CM

## 2021-07-31 DIAGNOSIS — I5042 Chronic combined systolic (congestive) and diastolic (congestive) heart failure: Secondary | ICD-10-CM | POA: Diagnosis present

## 2021-07-31 DIAGNOSIS — I639 Cerebral infarction, unspecified: Secondary | ICD-10-CM

## 2021-07-31 DIAGNOSIS — F329 Major depressive disorder, single episode, unspecified: Secondary | ICD-10-CM | POA: Diagnosis present

## 2021-07-31 LAB — SODIUM, URINE, RANDOM: Sodium, Ur: 14 mmol/L

## 2021-07-31 LAB — MRSA NEXT GEN BY PCR, NASAL: MRSA by PCR Next Gen: NOT DETECTED

## 2021-07-31 LAB — COMPREHENSIVE METABOLIC PANEL
ALT: 33 U/L (ref 0–44)
ALT: 43 U/L (ref 0–44)
AST: 80 U/L — ABNORMAL HIGH (ref 15–41)
AST: 73 U/L — ABNORMAL HIGH (ref 15–41)
Albumin: 4 g/dL (ref 3.5–5.0)
Albumin: 3.7 g/dL (ref 3.5–5.0)
Alkaline Phosphatase: 73 U/L (ref 38–126)
Alkaline Phosphatase: 107 U/L (ref 38–126)
Anion gap: 15 (ref 5–15)
Anion gap: 14 (ref 5–15)
BUN: 16 mg/dL (ref 6–20)
BUN: 17 mg/dL (ref 6–20)
CO2: 22 mmol/L (ref 22–32)
CO2: 21 mmol/L — ABNORMAL LOW (ref 22–32)
Calcium: 8.4 mg/dL — ABNORMAL LOW (ref 8.9–10.3)
Calcium: 8.6 mg/dL — ABNORMAL LOW (ref 8.9–10.3)
Chloride: 107 mmol/L (ref 98–111)
Chloride: 110 mmol/L (ref 98–111)
Creatinine, Ser: 1.24 mg/dL — ABNORMAL HIGH (ref 0.44–1.00)
Creatinine, Ser: 1.6 mg/dL — ABNORMAL HIGH (ref 0.44–1.00)
GFR, Estimated: 51 mL/min — ABNORMAL LOW (ref >60)
GFR, Estimated: 38 mL/min — ABNORMAL LOW (ref >60)
Glucose, Bld: 158 mg/dL — ABNORMAL HIGH (ref 70–99)
Glucose, Bld: 199 mg/dL — ABNORMAL HIGH (ref 70–99)
Potassium: 3.5 mmol/L (ref 3.5–5.1)
Potassium: 3.3 mmol/L — ABNORMAL LOW (ref 3.5–5.1)
Sodium: 144 mmol/L (ref 135–145)
Sodium: 145 mmol/L (ref 135–145)
Total Bilirubin: 2 mg/dL — ABNORMAL HIGH (ref 0.3–1.2)
Total Bilirubin: 1.5 mg/dL — ABNORMAL HIGH (ref 0.3–1.2)
Total Protein: 7.2 g/dL (ref 6.5–8.1)
Total Protein: 6.6 g/dL (ref 6.5–8.1)

## 2021-07-31 LAB — CBC
HCT: 43.8 % (ref 36.0–46.0)
Hemoglobin: 15 g/dL (ref 12.0–15.0)
MCH: 31.7 pg (ref 26.0–34.0)
MCHC: 34.2 g/dL (ref 30.0–36.0)
MCV: 92.6 fL (ref 80.0–100.0)
Platelets: 204 10*3/uL (ref 150–400)
RBC: 4.73 MIL/uL (ref 3.87–5.11)
RDW: 13.3 % (ref 11.5–15.5)
WBC: 22.5 10*3/uL — ABNORMAL HIGH (ref 4.0–10.5)
nRBC: 0 % (ref 0.0–0.2)

## 2021-07-31 LAB — HIV ANTIBODY (ROUTINE TESTING W REFLEX): HIV Screen 4th Generation wRfx: NONREACTIVE

## 2021-07-31 LAB — LIPID PANEL
Cholesterol: 211 mg/dL — ABNORMAL HIGH (ref 0–200)
HDL: 46 mg/dL (ref >40)
LDL Cholesterol: 141 mg/dL — ABNORMAL HIGH (ref 0–99)
Total CHOL/HDL Ratio: 4.6 RATIO
Triglycerides: 118 mg/dL (ref <150)
VLDL: 24 mg/dL (ref 0–40)

## 2021-07-31 LAB — BLOOD GAS, ARTERIAL
Acid-base deficit: 1.3 mmol/L (ref 0.0–2.0)
Allens test (pass/fail): POSITIVE — AB
Bicarbonate: 21.5 mmol/L (ref 20.0–28.0)
Drawn by: 29503
O2 Saturation: 92.6 %
Patient temperature: 98.6
pCO2 arterial: 31.9 mmHg — ABNORMAL LOW (ref 32.0–48.0)
pH, Arterial: 7.443 (ref 7.350–7.450)
pO2, Arterial: 68.1 mmHg — ABNORMAL LOW (ref 83.0–108.0)

## 2021-07-31 LAB — HEMOGLOBIN A1C
Hgb A1c MFr Bld: 6.4 % — ABNORMAL HIGH (ref 4.8–5.6)
Mean Plasma Glucose: 136.98 mg/dL

## 2021-07-31 LAB — PROCALCITONIN: Procalcitonin: 1.24 ng/mL

## 2021-07-31 LAB — ECHOCARDIOGRAM COMPLETE
Area-P 1/2: 6.96 cm2
Height: 66 in
S' Lateral: 3.9 cm
Weight: 3597.91 oz

## 2021-07-31 LAB — LACTIC ACID, PLASMA
Lactic Acid, Venous: 3.7 mmol/L (ref 0.5–1.9)
Lactic Acid, Venous: 3.4 mmol/L (ref 0.5–1.9)
Lactic Acid, Venous: 3.6 mmol/L (ref 0.5–1.9)
Lactic Acid, Venous: 5.3 mmol/L (ref 0.5–1.9)
Lactic Acid, Venous: 4.1 mmol/L (ref 0.5–1.9)

## 2021-07-31 LAB — CREATININE, URINE, RANDOM: Creatinine, Urine: 517.23 mg/dL

## 2021-07-31 LAB — CK: Total CK: 5023 U/L — ABNORMAL HIGH (ref 38–234)

## 2021-07-31 LAB — AMMONIA: Ammonia: 28 umol/L (ref 9–35)

## 2021-07-31 MED ORDER — SODIUM CHLORIDE 0.9 % IV SOLN
2.0000 g | INTRAVENOUS | Status: DC
  Filled 2021-07-31: qty 20

## 2021-07-31 MED ORDER — PERFLUTREN LIPID MICROSPHERE
1.0000 mL | INTRAVENOUS | Status: AC | PRN
  Administered 2021-07-31: 13:00:00 6 mL via INTRAVENOUS
  Filled 2021-07-31: qty 10

## 2021-07-31 MED ORDER — LACTATED RINGERS IV BOLUS: 1000.0000 mL | Freq: Once | INTRAVENOUS | Status: DC

## 2021-07-31 MED ORDER — LABETALOL HCL 5 MG/ML IV SOLN
10.0000 mg | INTRAVENOUS | Status: DC | PRN
  Administered 2021-07-31 – 2021-08-01 (×3): 10 mg via INTRAVENOUS
  Filled 2021-07-31 (×3): qty 4

## 2021-07-31 MED ORDER — ONDANSETRON HCL 4 MG/2ML IJ SOLN
4.0000 mg | Freq: Four times a day (QID) | INTRAMUSCULAR | Status: DC | PRN
  Administered 2021-07-31 – 2021-08-07 (×4): 4 mg via INTRAVENOUS
  Filled 2021-07-31 (×5): qty 2

## 2021-07-31 MED ORDER — LORAZEPAM 2 MG/ML IJ SOLN
2.0000 mg | Freq: Once | INTRAMUSCULAR | Status: DC
  Filled 2021-07-31: qty 1

## 2021-07-31 MED ORDER — POTASSIUM CHLORIDE 10 MEQ/100ML IV SOLN
10.0000 meq | INTRAVENOUS | Status: AC
  Administered 2021-07-31 – 2021-08-01 (×3): 10 meq via INTRAVENOUS
  Filled 2021-07-31 (×3): qty 100

## 2021-07-31 MED ORDER — CHLORHEXIDINE GLUCONATE CLOTH 2 % EX PADS
6.0000 | MEDICATED_PAD | Freq: Every day | CUTANEOUS | Status: DC
  Administered 2021-07-31 – 2021-08-13 (×14): 6 via TOPICAL

## 2021-07-31 NOTE — Progress Notes (Signed)
Report called to Lovena Le, RN. Patient transferred to 4N20. All belongings taken by spouse.

## 2021-07-31 NOTE — ED Notes (Signed)
Attempted to call report, was told nurse was on break and would call back.

## 2021-07-31 NOTE — Consult Note (Addendum)
Neurology Consultation  Reason for Consult: MRI brain with acute infarctions Referring Physician: Dr. Roderic Palau  CC: Patient is unable to provide a chief complaint due to her altered mental status  History is obtained from: Chart review, patient's family, unable to obtain from patient due to patient's mental status  HPI: Dorothy Alvarez is a 55 y.o. female with a medical history significant for anxiety, depression, essential hypertension, and chronic pain syndrome on opiates who presented to [facility] 12/13 for evaluation of 1 week of progressive altered mental status, lethargy, and emesis.  Patient's husband states that on Tuesday 12/6 patient began to appear more lethargic and would only wake for about 5 minutes at a time to have a conversation prior to falling back to sleep and sleeping most of the day every day.  He states that her mental status progressively worsened and at 1 point she was even found asleep while on the commode after having diarrhea.  Family does have some concern that she recently switched from hydrocodone-ibuprofen to oxycodone a few weeks ago and consider this may be contributing to her clinical status.  An MRI brain was obtained revealing restricted diffusion in the medial left occipital lobe and posterior left hippocampus concerning for an acute left PCA territory infarct with additional punctate areas of restricted diffusion in bilateral cerebral and right cerebellar hemisphere as well as the medial right thalamus concerning for embolic etiology.  Patient was transferred to [facility] for further neurology evaluation and work-up.  Initial work-up in the ED revealed the patient had a fever of up to 100.9 F, sinus tachycardia with a heart rate into the 120s, hypertensive urgency with blood pressures as high as 205/146, leukocytosis with a white blood cell count of 18.1 AKI with creatinine 1.73, elevated lactate with elevation of up to 5.3 today, and CPK elevation of 8000.  LKW: 12/6 TNK  given?: no, patient presented well outside of the window for thrombolytic therapy IR Thrombectomy? No, patient's last known well was greater than 1 week ago Modified Rankin Scale: 0-Completely asymptomatic and back to baseline post- stroke  ROS:\Unable to obtain due to altered mental status.   Past Medical History:  Diagnosis Date   Allergy    Anxiety    Arthritis    Chronic abdominal pain    Constipation    Depression    Hypertension    IBS (irritable bowel syndrome)    Learning disability    Neuromuscular disorder (HCC)    Past Surgical History:  Procedure Laterality Date   ABDOMINAL HYSTERECTOMY     GALLBLADDER SURGERY     KNEE SURGERY     RIGHT/LEFT HEART CATH AND CORONARY ANGIOGRAPHY N/A 05/04/2020   Procedure: RIGHT/LEFT HEART CATH AND CORONARY ANGIOGRAPHY;  Surgeon: Belva Crome, MD;  Location: [facility];  Service: Cardiovascular;  Laterality: N/A;   Family History  Problem Relation Age of Onset   Diabetes Mother    Suicidality Mother    Heart disease Father    Depression Father    Social History:   reports that she has never smoked. She has never used smokeless tobacco. She reports current drug use. Drugs: Other-see comments and Marijuana. She reports that she does not drink alcohol.  Medications  Current Facility-Administered Medications:     stroke: mapping our early stages of recovery book, , Does not apply, Once, Alcario Drought, Jared M, DO   acetaminophen (TYLENOL) tablet 650 mg, 650 mg, Oral, Q4H PRN **OR** acetaminophen (TYLENOL) 160 MG/5ML solution 650 mg,  650 mg, Per Tube, Q4H PRN **OR** acetaminophen (TYLENOL) suppository 650 mg, 650 mg, Rectal, Q4H PRN, Etta Quill, DO, 650 mg at 07/31/21 1446   aspirin suppository 300 mg, 300 mg, Rectal, Daily, 300 mg at 07/31/21 0867 **OR** aspirin tablet 325 mg, 325 mg, Oral, Daily, Alcario Drought, Jared M, DO   [START ON 08/01/2021] cefTRIAXone (ROCEPHIN) 2 g in sodium chloride 0.9 % 100 mL IVPB, 2 g, Intravenous,  Q24H, Memon, Jehanzeb, MD   Chlorhexidine Gluconate Cloth 2 % PADS 6 each, 6 each, Topical, Daily, Alcario Drought, Jared M, DO   enoxaparin (LOVENOX) injection 40 mg, 40 mg, Subcutaneous, Q24H, Alcario Drought, Jared M, DO, 40 mg at 07/31/21 0935   labetalol (NORMODYNE) injection 10 mg, 10 mg, Intravenous, Q2H PRN, Kathie Dike, MD   lactated ringers bolus 1,000 mL, 1,000 mL, Intravenous, Once, Kathie Dike, MD   lactated ringers infusion, , Intravenous, Continuous, Kathie Dike, MD, Last Rate: 150 mL/hr at 07/31/21 0120, New Bag at 07/31/21 0120   naloxone [facility]) injection 0.4 mg, 0.4 mg, Intravenous, Once, Prosperi, Christian H, PA-C   ondansetron (ZOFRAN) injection 4 mg, 4 mg, Intravenous, Q6H PRN, Kathie Dike, MD, 4 mg at 07/31/21 1329  Exam: Current vital signs: BP (!) 143/125 (BP Location: Left Wrist)    Pulse 91    Temp 97.9 F (36.6 C) (Axillary)    Resp 18    Ht 5\' 6"  (1.676 m)    Wt 102 kg    SpO2 95%    BMI 36.29 kg/m  Vital signs in last 24 hours: Temp:  [97.9 F (36.6 C)-101.1 F (38.4 C)] 97.9 F (36.6 C) (12/14 1642) Pulse Rate:  [90-119] 91 (12/14 1642) Resp:  [9-33] 18 (12/14 1642) BP: (126-205)/(93-146) 143/125 (12/14 1642) SpO2:  [94 %-100 %] 95 % (12/14 1642) Weight:  [102 kg] 102 kg (12/14 0722)  GENERAL: Lethargic, confused, in no acute distress Psych: Patient is altered, uncooperative with examination Head: Normocephalic and atraumatic, without obvious abnormality EENT: Patient actively resists eye opening and unable to assess eyes, no OP obstruction, dry mucous membranes LUNGS: Normal respiratory effort. Non-labored breathing on room air CV: Regular rate and rhythm on telemetry, no pedal edema ABDOMEN: Soft, non-tender, non-distended Extremities: warm, well perfused, without obvious deformity  NEURO:  Mental Status: Lethargic, arouses to constant stimulation.   Patient moans but does not answer any orientation questions. She is unable to provide a clear  and coherent history of present illness.   She does not attempt to name objects, repeat phrases. No neglect is noted Cranial Nerves:  II: Unable to assess patient's pupillary response due to patient's active resistance to eye opening III, IV, VI: Unable to assess due to patient's active resistance to eye opening V: Unable to assess blink to threat as patient actively resists eye opening, patient does not provide information regarding sensation to face.  Patient does not blink bilaterally to eyelash brush. VII: Face is symmetric resting and grimacing. VIII: Hearing is intact to voice IX, X: Unable to assess soft palate patient does not participate in evaluation. XI: Head is grossly midline XII: Does not protrude tongue to command Motor: Patient does not follow commands with her extremities. She does attempt to push examiner away with attempted visualization of her eyes and appears stronger on the left than the right upper extremity. Patient withdraws briskly and equally with bilateral lower extremities Tone is normal. Bulk is normal.  Sensation: Patient moans and withdraws equally with application of noxious stimuli bilateral lower extremities  with slight increased withdrawal of left upper extremity compared to the right Coordination: Unable to assess, patient does not perform DTRs: 2+ and symmetric throughout.  Plantars: Toes downgoing bilaterally Gait: Deferred for patient safety  NIHSS: 1a Level of Conscious.: 1 1b LOC Questions: 2 1c LOC Commands: 2 2 Best Gaze: 0 3 Visual: 0 4 Facial Palsy: 0 5a Motor Arm - left: 1 5b Motor Arm - Right: 1 6a Motor Leg - Left: 3 6b Motor Leg - Right: 3 7 Limb Ataxia: 0 8 Sensory: 0 9 Best Language: 3 10 Dysarthria: 2 11 Extinct. and Inatten.: 0 TOTAL: 18  Labs I have reviewed labs in epic and the results pertinent to this consultation are: CBC    Component Value Date/Time   WBC 22.5 (H) 07/31/2021 0344   RBC 4.73 07/31/2021 0344    HGB 15.0 07/31/2021 0344   HCT 43.8 07/31/2021 0344   PLT 204 07/31/2021 0344   MCV 92.6 07/31/2021 0344   MCV 93.3 05/08/2015 1534   MCH 31.7 07/31/2021 0344   MCHC 34.2 07/31/2021 0344   RDW 13.3 07/31/2021 0344   LYMPHSABS 1.8 07/30/2021 1712   MONOABS 1.2 (H) 07/30/2021 1712   EOSABS 0.0 07/30/2021 1712   BASOSABS 0.1 07/30/2021 1712   CMP     Component Value Date/Time   NA 144 07/31/2021 0344   K 3.5 07/31/2021 0344   CL 107 07/31/2021 0344   CO2 22 07/31/2021 0344   GLUCOSE 158 (H) 07/31/2021 0344   BUN 16 07/31/2021 0344   CREATININE 1.24 (H) 07/31/2021 0344   CREATININE 1.02 07/04/2015 1603   CALCIUM 8.4 (L) 07/31/2021 0344   PROT 7.2 07/31/2021 0344   ALBUMIN 4.0 07/31/2021 0344   AST 80 (H) 07/31/2021 0344   ALT 33 07/31/2021 0344   ALKPHOS 73 07/31/2021 0344   BILITOT 2.0 (H) 07/31/2021 0344   GFRNONAA 51 (L) 07/31/2021 0344   GFRAA 60 (L) 05/04/2020 0453   Lipid Panel     Component Value Date/Time   CHOL 211 (H) 07/31/2021 0344   TRIG 118 07/31/2021 0344   HDL 46 07/31/2021 0344   CHOLHDL 4.6 07/31/2021 0344   VLDL 24 07/31/2021 0344   LDLCALC 141 (H) 07/31/2021 0344   Lab Results  Component Value Date   HGBA1C 6.4 (H) 07/31/2021   Lab Results  Component Value Date   CKTOTAL 5,023 (H) 07/31/2021   Lactic Acid, Venous    Component Value Date/Time   LATICACIDVEN 5.3 (HH) 07/31/2021 1303   Urinalysis    Component Value Date/Time   COLORURINE YELLOW 07/30/2021 2046   APPEARANCEUR HAZY (A) 07/30/2021 2046   LABSPEC 1.016 07/30/2021 2046   PHURINE 7.0 07/30/2021 2046   GLUCOSEU 150 (A) 07/30/2021 2046   HGBUR MODERATE (A) 07/30/2021 2046   BILIRUBINUR NEGATIVE 07/30/2021 2046   BILIRUBINUR negative 05/08/2015 1540   KETONESUR 20 (A) 07/30/2021 2046   PROTEINUR 100 (A) 07/30/2021 2046   UROBILINOGEN 0.2 05/08/2015 1540   UROBILINOGEN 1.0 03/01/2015 2017   NITRITE NEGATIVE 07/30/2021 2046   LEUKOCYTESUR NEGATIVE 07/30/2021 2046   Drugs of  Abuse     Component Value Date/Time   LABOPIA NONE DETECTED 07/30/2021 2046   COCAINSCRNUR NONE DETECTED 07/30/2021 2046   LABBENZ NONE DETECTED 07/30/2021 2046   AMPHETMU NONE DETECTED 07/30/2021 2046   THCU NONE DETECTED 07/30/2021 2046   LABBARB NONE DETECTED 07/30/2021 2046    Imaging I have reviewed the images obtained:  CT-scan of the brain 12/14: 1.  Probable chronic right occipital lobe infarct although subacute timing is a possibility. Otherwise, no acute intracranial findings. 2. Degenerative changes in the cervical spine without evidence for an acute fracture. 3. Loss of cervical lordosis. This can be related to patient positioning, muscle spasm or soft tissue injury.  MRI examination of the brain 12/14: 1. Restricted diffusion in the medial left occipital lobe and posterior left hippocampus, most likely an acute left PCA territory infarct. 2. Additional more punctate areas of restricted diffusion in the bilateral cerebral and right cerebellar hemisphere, as well as the medial right thalamus, concerning for additional embolic etiology. 3. Encephalomalacia in the medial right occipital lobe, right posterior hippocampus, and left frontoparietal region, likely sequela of remote infarcts.  Echocardiogram TTE 12/14: 1. Left ventricular ejection fraction, by estimation, is at best 20%. The left ventricle has severely decreased function. The left ventricle demonstrates global hypokinesis. There is mild left ventricular hypertrophy. Left ventricular diastolic parameters are indeterminate. There is LV trabeculation without thrombus.   2. Right ventricular systolic function is moderately reduced. The right ventricular size is normal. There is severely elevated pulmonary artery systolic pressure. The estimated right ventricular systolic pressure is 73.4 mmHg.   3. Left atrial size was mildly dilated.   4. The mitral valve is normal in structure. Mild mitral valve regurgitation. No evidence  of mitral stenosis.   5. Tricuspid valve regurgitation is moderate to severe.   6. The aortic valve is tricuspid. There is mild thickening of the aortic valve. Aortic valve regurgitation is not visualized. Aortic valve sclerosis is present, with no evidence of aortic valve stenosis.   7. The inferior vena cava is dilated in size with <50% respiratory variability, suggesting right atrial pressure of 15 mmHg.   Assessment: 55 year old female who presented initially to [facility] long ED on 12/13 for 1 week of progressive lethargy and AMS with MRI concerning for acute left PCA territory infarct with additional punctate areas of restricted diffusion in the bilateral cerebral and right cerebellar hemispheres as well as the medial right thalamus concerning for embolic etiology. Echo with EF 20% - Examination reveals patient that is extremely lethargic and uncooperative with exam.  She does not follow commands, does not or answer orientation questions, actively resists examiner eye-opening.  She appears to have left greater than right upper extremity movement, and bilateral lower extremity movement.  Her NIHSS on arrival to [facility] is 18. - MRI brain as above.  Echocardiogram reveals an estimated LVEF of at best 20%.  The left ventricle has severely decreased function with global hypokinesis.  There is no LV thrombus identified. Likely the source - Imaging is concerning for embolic appearing strokes and with elevated white count and fever, cannot rule out septic emboli at this time and will need to consider TEE for further evaluation of valvular thrombus/vegetation.  The patient's altered mental status, leukocytosis, elevated lactate, and fever it is possible that there is an infectious process also driving her decline in mental status at this time.  Will need further infectious work-up. Will also obtain routine EEG to rule out seizure activity as a source for lactic acidosis. No antiplatelet or anticoagulant till  head vascular imaging rules our mycotic aneurysms.  Recommendations: - Statin for goal LDL < 70 - MRA of the brain without contrast - Frequent neuro checks - Consider TEE - Carotid dopplers - Prophylactic therapy- NONE FOR NOW-if MRA brain is negative for mycotic aneurysms - can start ASA at that time - Risk factor modification -  Telemetry monitoring; will likely need 30-day event monitoring outpatient if no arrhythmia captured while inpatient - PT consult, OT consult, Speech consult - Stroke team to follow - Routine EEG  - Sepsis treatment per primary team -No AED for now.  Pt seen by NP/Neuro and later by MD. Note/plan to be edited by MD as needed.  Anibal Henderson, AGAC-NP [facility] Pager: [phone]  Attending Neurohospitalist Addendum Patient seen and examined with APP/Resident. Agree with the history and physical as documented above. Agree with the plan as documented, which I helped formulate. I have independently reviewed the chart, obtained history, review of systems and examined the patient.I have personally reviewed pertinent head/neck/spine imaging (CT/MRI). Plan d/w Marni Griffon NP PCCM Please feel free to call with any questions.  -- Amie Portland, MD Neurologist [facility] Pager: [phone]  CRITICAL CARE ATTESTATION Performed by: Amie Portland, MD Total critical care time: 35 minutes Critical care time was exclusive of separately billable procedures and treating other patients and/or supervising APPs/Residents/Students Critical care was necessary to treat or prevent imminent or life-threatening deterioration due to stroke, toxic metabolic encephalopathy  This patient is critically ill and at significant risk for neurological worsening and/or death and care requires constant monitoring. Critical care was time spent personally by me on the following activities: development of treatment plan with patient and/or surrogate as well as  nursing, discussions with consultants, evaluation of patient's response to treatment, examination of patient, obtaining history from patient or surrogate, ordering and performing treatments and interventions, ordering and review of laboratory studies, ordering and review of radiographic studies, pulse oximetry, re-evaluation of patient's condition, participation in multidisciplinary rounds and medical decision making of high complexity in the care of this patient.

## 2021-07-31 NOTE — ED Notes (Signed)
Report given to [facility]. Stated they would be here in 10 mins .

## 2021-07-31 NOTE — Progress Notes (Signed)
Echocardiogram 2D Echocardiogram has been performed.  Oneal Deputy Xian Alves RDCS 07/31/2021, 12:31 PM

## 2021-07-31 NOTE — ED Notes (Signed)
Patients daughter would like a call back with an update: [place] [phone]

## 2021-07-31 NOTE — Progress Notes (Addendum)
PROGRESS NOTE    Dorothy Alvarez  XQJ:[id number] DOB: [birth year] DOA: 07/30/2021 PCP: Nolene Ebbs, MD    Brief Narrative:  55 year old female with a history of hypertension, chronic pain syndrome, bipolar/depression, has been having periods of increasing somnolence/lethargy where she was sleeping for most of the day for the last several days.  She was brought to the hospital today when family had difficulty waking her up.  She is noted to be febrile, leukocytosis, imaging indicating episode of cystitis.  She also had acute kidney injury and rhabdomyolysis.  Started on IV antibiotics and IV fluids.  MRI brain did indicate possible renal infarct.  She is being admitted to [facility] for neurology evaluation.   Assessment & Plan:   Principal Problem:   Acute encephalopathy Active Problems:   Essential hypertension   Bipolar II disorder (HCC)   AKI (acute kidney injury) ([place])   Acute ischemic stroke (HCC)   Rhabdomyolysis   SIRS (systemic inflammatory response syndrome) (HCC)   Mass of soft tissue of abdomen   Chronic combined systolic and diastolic CHF (congestive heart failure) (HCC)   MDD (major depressive disorder)   Acute metabolic encephalopathy -At this point, possibly multifactorial -Normally, she is awake, alert and oriented -Currently, she remains lethargic, does not open her eyes to voice.  She does respond to pain modalities pain placed.  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 -She is currently on ceftriaxone -Does not have any history of drug-resistant UTI -Follow-up blood cultures  Acute CVA -Noted to have small infarct in  left PCA distribution -Also commented on possible bilateral small infarct consistent with embolic -2D echocardiogram did not show any evidence of thrombus or vegetation -Follow-up blood cultures -Neurology will be evaluating when she arrives to [facility] -Aspirin has been ordered  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 Matilde Sprang who recommended to continue antibiotics and foley catheter for now.  Rhabdomyolysis -Unclear etiology, possibly related to the fact that she has been sleeping much more lately and unable to further -Overall CKs are trending down-fluids -Watch volume status closely with hydration due to low EF  Chronic combined CHF -EF on last are noted to be 25 to 30% -Current EF noted to be 20% on echo -Currently appears to be compensated -Hold off on diuretics since she is being actively hydrated -Difficult to give p.o. medications with mental status/nausea  Hypertension -Blood pressure is noted to be elevated -Chronically on amlodipine and benazepril which were held due to mental status. -We will use as needed IV labetalol for now while mental status depressed and patient is nauseous  History of bipolar/depression -Chronically on Seroquel -This will be held for now while mental status is depressed  Lactic acidosis -Initial lactate noted to be 4.5 on arrival, trended down to 3.6 with IV fluids -Most recent lactate noted to be 5.3, although this will be repeated due to concerns for accuracy -Currently, she does not appear to be hypotensive or hypoxic -Continue with IV fluids -If lactate remains elevated, will discuss with critical care  Abdominal mass -Incidental finding of 4.7 cm rounded  soft tissue structure on CT abdomen, between gastric fundus and left upper pole left kidney -Nonemergent evaluation with CT abdomen with IV and oral  contrast will be needed  Chronic pain -Chronically on oxycodone  Acute kidney injury -Likely related to sepsis -No obstruction noted on imaging -Creatinine 1.7 on admission -Improved to 1.2 with IV fluids -Monitor urine output  Addendum 1500: Informed of rising lactic acid and persistent fevers Will continue IV fluids, she has received 2L+ since yesterday With low EF, aggressive hydration may be challenging Discussed with PCCM who will also evaluate patient   DVT prophylaxis: enoxaparin (LOVENOX) injection 40 mg Start: 07/31/21 1000  Code Status: Full code Family Communication: Discussed with patient's daughter and husband at the bedside.  She reports that her daughter Phineas Real be the primary contact Disposition Plan: Status is: Inpatient  Remains inpatient appropriate because: Continued altered mental status, further treatment of infection, further work-up of stroke         Consultants:  Neurology  Procedures:  Echocardiogram: EF 20% which is lower than it was last year, grade 3 diastolic dysfunction, global hypokinesis  Antimicrobials:  Ceftriaxone 12/13 >   Subjective: Unable to provide history  Objective: Vitals:   07/31/21 0934 07/31/21 0939 07/31/21 1000 07/31/21 1330  BP:  (!) 184/129 (!) 142/114 (!) 176/131  Pulse:  (!) 107 92 (!) 106  Resp:  (!) 21 (!) 23 (!) 26  Temp: (!) 100.9 F (38.3 C)     TempSrc: Rectal     SpO2:  98% 98% 100%  Weight:      Height:        Intake/Output Summary (Last 24 hours) at 07/31/2021 1355 Last data filed at 07/31/2021 0547 Gross per 24 hour  Intake 2600.28 ml  Output --  Net 2600.28 ml   Filed Weights   07/31/21 0722  Weight: 102 kg    Examination:  General exam: Patient keeps her eyes closed, briefly responds to voice.  Does not follow commands.  Does not appear to be in distress Respiratory system: Clear to auscultation. Respiratory effort normal. Cardiovascular system: S1 & S2 heard, tachycardic. No  JVD, murmurs, rubs, gallops or clicks. No pedal edema. Gastrointestinal system: Abdomen is nondistended, soft and nontender. No organomegaly or masses felt. Normal bowel sounds heard. Central nervous system: Limited exam due to mental status, she is moving all 4 extremity spontaneously.  No facial asymmetry. Extremities: Symmetric 5 x 5 power. Skin: No rashes, lesions or ulcers Psychiatry: Unable to assess due to lethargy    Data Reviewed: I have personally reviewed following labs and imaging studies  CBC: Recent Labs  Lab 07/30/21 1712 07/31/21 0344  WBC 18.1* 22.5*  NEUTROABS 14.9*  --   HGB 16.1* 15.0  HCT 47.1* 43.8  MCV 92.4 92.6  PLT 213 539   Basic Metabolic Panel: Recent Labs  Lab 07/30/21 1712 07/31/21 0344  NA 144 144  K 3.2* 3.5  CL 104 107  CO2 23 22  GLUCOSE 190* 158*  BUN 19 16  CREATININE 1.73* 1.24*  CALCIUM 9.6 8.4*   GFR: Estimated Creatinine Clearance: 61.1 mL/min (A) (by C-G formula based on SCr of 1.24 mg/dL (H)). Liver Function Tests: Recent Labs  Lab 07/30/21 1712 07/31/21 0344  AST 107* 80*  ALT 36 33  ALKPHOS 87 73  BILITOT 1.7* 2.0*  PROT 8.1 7.2  ALBUMIN 4.5 4.0   No results for input(s): LIPASE, AMYLASE in the last 168 hours. No results for input(s): AMMONIA in the  last 168 hours. Coagulation Profile: No results for input(s): INR, PROTIME in the last 168 hours. Cardiac Enzymes: Recent Labs  Lab 07/30/21 1712 07/31/21 0344  CKTOTAL 8,816* 5,023*   BNP (last 3 results) No results for input(s): PROBNP in the last 8760 hours. HbA1C: Recent Labs    07/31/21 0344  HGBA1C 6.4*   CBG: Recent Labs  Lab 07/30/21 1654  GLUCAP 189*   Lipid Profile: Recent Labs    07/31/21 0344  CHOL 211*  HDL 46  LDLCALC 141*  TRIG 118  CHOLHDL 4.6   Thyroid Function Tests: No results for input(s): TSH, T4TOTAL, FREET4, T3FREE, THYROIDAB in the last 72 hours. Anemia Panel: No results for input(s): VITAMINB12, FOLATE, FERRITIN,  TIBC, IRON, RETICCTPCT in the last 72 hours. Sepsis Labs: Recent Labs  Lab 07/31/21 0112 07/31/21 0344 07/31/21 0901 07/31/21 1303  PROCALCITON 1.24  --   --   --   LATICACIDVEN 3.7* 3.4* 3.6* 5.3*    Recent Results (from the past 240 hour(s))  Blood culture (routine x 2)     Status: None (Preliminary result)   Collection Time: 07/30/21  6:21 PM   Specimen: BLOOD  Result Value Ref Range Status   Specimen Description   Final    BLOOD LEFT ANTECUBITAL Performed at [facility], [place] [address], [place], [place] [address]    Special Requests   Final    BOTTLES DRAWN AEROBIC AND ANAEROBIC Blood Culture results may not be optimal due to an inadequate volume of blood received in culture bottles Performed at [facility] [address] [place], [place] [address]    Culture   Final    NO GROWTH < 12 HOURS Performed at [facility] [address], [place], [place] [address]    Report Status PENDING  Incomplete  Blood culture (routine x 2)     Status: None (Preliminary result)   Collection Time: 07/30/21  6:21 PM   Specimen: BLOOD  Result Value Ref Range Status   Specimen Description   Final    BLOOD RIGHT ANTECUBITAL Performed at [facility] [address], [place], [place] [address]    Special Requests   Final    BOTTLES DRAWN AEROBIC AND ANAEROBIC Blood Culture results may not be optimal due to an inadequate volume of blood received in culture bottles Performed at [facility] [address] [place], [place] [address]    Culture   Final    NO GROWTH < 12 HOURS Performed at [facility] [address], [place],  [address]    Report Status PENDING  Incomplete  Resp Panel by RT-PCR (Flu A&B, Covid) Nasopharyngeal Swab     Status: None   Collection Time: 07/30/21  6:25 PM   Specimen: Nasopharyngeal Swab; Nasopharyngeal(NP) swabs in vial transport medium  Result Value Ref Range  Status   SARS Coronavirus 2 by RT PCR NEGATIVE NEGATIVE Final    Comment: (NOTE) SARS-CoV-2 target nucleic acids are NOT DETECTED.  The SARS-CoV-2 RNA is generally detectable in upper respiratory specimens during the acute phase of infection. The lowest concentration of SARS-CoV-2 viral copies this assay can detect is 138 copies/mL. A negative result does not preclude SARS-Cov-2 infection and should not be used as the sole basis for treatment or other patient management decisions. A negative result may occur with  improper specimen collection/handling, submission of specimen other than nasopharyngeal swab, presence of viral mutation(s) within the areas  targeted by this assay, and inadequate number of viral copies(<138 copies/mL). A negative result must be combined with clinical observations, patient history, and epidemiological information. The expected result is Negative.  Fact Sheet for Patients:  EntrepreneurPulse.com.au  Fact Sheet for Healthcare Providers:  IncredibleEmployment.be  This test is no t yet approved or cleared by the [place] FDA and  has been authorized for detection and/or diagnosis of SARS-CoV-2 by FDA under an Emergency Use Authorization (EUA). This EUA will remain  in effect (meaning this test can be used) for the duration of the COVID-19 declaration under Section 564(b)(1) of the Act, 21 U.S.C.section 360bbb-3(b)(1), unless the authorization is terminated  or revoked sooner.       Influenza A by PCR NEGATIVE NEGATIVE Final   Influenza B by PCR NEGATIVE NEGATIVE Final    Comment: (NOTE) The Xpert Xpress SARS-CoV-2/FLU/RSV plus assay is intended as an aid in the diagnosis of influenza from Nasopharyngeal swab specimens and should not be used as a sole basis for treatment. Nasal washings and aspirates are unacceptable for Xpert Xpress SARS-CoV-2/FLU/RSV testing.  Fact Sheet for  Patients: EntrepreneurPulse.com.au  Fact Sheet for Healthcare Providers: IncredibleEmployment.be  This test is not yet approved or cleared by the [place] FDA and has been authorized for detection and/or diagnosis of SARS-CoV-2 by FDA under an Emergency Use Authorization (EUA). This EUA will remain in effect (meaning this test can be used) for the duration of the COVID-19 declaration under Section 564(b)(1) of the Act, 21 U.S.C. section 360bbb-3(b)(1), unless the authorization is terminated or revoked.  Performed at [facility], [place] [address], [place], [place] [address]   MRSA Next Gen by PCR, Nasal     Status: None   Collection Time: 07/31/21  1:13 AM   Specimen: Nasal Mucosa; Nasal Swab  Result Value Ref Range Status   MRSA by PCR Next Gen NOT DETECTED NOT DETECTED Final    Comment: (NOTE) The GeneXpert MRSA Assay (FDA approved for NASAL specimens only), is one component of a comprehensive MRSA colonization surveillance program. It is not intended to diagnose MRSA infection nor to guide or monitor treatment for MRSA infections. Test performance is not FDA approved in patients less than 9 years old. Performed at [facility], [place] [address], [place], [place] [address]          Radiology Studies: DG Chest 2 View  Result Date: 07/30/2021 CLINICAL DATA:  Altered level of con gaseousness, sleepiness, lethargy, tachycardia, intermittent nausea and vomiting for 2 weeks, history hypertension EXAM: CHEST - 2 VIEW COMPARISON:  05/01/2020 FINDINGS: Enlargement of cardiac silhouette with pulmonary vascular congestion. Mediastinal contours normal. Lungs clear. No acute infiltrate, pleural effusion, or pneumothorax. No focal osseous abnormalities. IMPRESSION: Enlargement of cardiac silhouette with pulmonary vascular congestion. No acute abnormalities. Electronically Signed   By: Lavonia Dana M.D.   On:  07/30/2021 17:59   CT HEAD WO CONTRAST (5MM)  Result Date: 07/30/2021 CLINICAL DATA:  Lethargy. EXAM: CT HEAD WITHOUT CONTRAST CT CERVICAL SPINE WITHOUT CONTRAST TECHNIQUE: Multidetector CT imaging of the head and cervical spine was performed following the standard protocol without intravenous contrast. Multiplanar CT image reconstructions of the cervical spine were also generated. COMPARISON:  None. FINDINGS: CT HEAD FINDINGS Brain: Subacute to chronic right occipital lobe infarct. No evidence for acute hemorrhage, hydrocephalus, or abnormal extra-axial fluid collection. Patchy low attenuation in the deep hemispheric and periventricular white matter is nonspecific, but likely reflects chronic microvascular ischemic demyelination. Vascular: No hyperdense  vessel or unexpected calcification. Skull: No evidence for fracture. No worrisome lytic or sclerotic lesion. Sinuses/Orbits: The visualized paranasal sinuses and mastoid air cells are clear. Visualized portions of the globes and intraorbital fat are unremarkable. Other: None. CT CERVICAL SPINE FINDINGS Alignment: Straightening of normal cervical lordosis. Skull base and vertebrae: Lower cervical spine partially obscured by motion artifact. Within this limitation, there is no evidence for acute fracture. Soft tissues and spinal canal: No prevertebral fluid or swelling. No visible canal hematoma. Disc levels:  Loss of disc height noted C5-6, C6-7, and C7-T1. Upper chest: Unremarkable. Other: None. IMPRESSION: 1. Probable chronic right occipital lobe infarct although subacute timing is a possibility. Otherwise, no acute intracranial findings. 2. Degenerative changes in the cervical spine without evidence for an acute fracture. 3. Loss of cervical lordosis. This can be related to patient positioning, muscle spasm or soft tissue injury. Electronically Signed   By: Misty Stanley M.D.   On: 07/30/2021 18:57   CT Cervical Spine Wo Contrast  Result Date:  07/30/2021 CLINICAL DATA:  Lethargy. EXAM: CT HEAD WITHOUT CONTRAST CT CERVICAL SPINE WITHOUT CONTRAST TECHNIQUE: Multidetector CT imaging of the head and cervical spine was performed following the standard protocol without intravenous contrast. Multiplanar CT image reconstructions of the cervical spine were also generated. COMPARISON:  None. FINDINGS: CT HEAD FINDINGS Brain: Subacute to chronic right occipital lobe infarct. No evidence for acute hemorrhage, hydrocephalus, or abnormal extra-axial fluid collection. Patchy low attenuation in the deep hemispheric and periventricular white matter is nonspecific, but likely reflects chronic microvascular ischemic demyelination. Vascular: No hyperdense vessel or unexpected calcification. Skull: No evidence for fracture. No worrisome lytic or sclerotic lesion. Sinuses/Orbits: The visualized paranasal sinuses and mastoid air cells are clear. Visualized portions of the globes and intraorbital fat are unremarkable. Other: None. CT CERVICAL SPINE FINDINGS Alignment: Straightening of normal cervical lordosis. Skull base and vertebrae: Lower cervical spine partially obscured by motion artifact. Within this limitation, there is no evidence for acute fracture. Soft tissues and spinal canal: No prevertebral fluid or swelling. No visible canal hematoma. Disc levels:  Loss of disc height noted C5-6, C6-7, and C7-T1. Upper chest: Unremarkable. Other: None. IMPRESSION: 1. Probable chronic right occipital lobe infarct although subacute timing is a possibility. Otherwise, no acute intracranial findings. 2. Degenerative changes in the cervical spine without evidence for an acute fracture. 3. Loss of cervical lordosis. This can be related to patient positioning, muscle spasm or soft tissue injury. Electronically Signed   By: Misty Stanley M.D.   On: 07/30/2021 18:57   MR BRAIN WO CONTRAST  Result Date: 07/31/2021 CLINICAL DATA:  Lethargy, mental status change EXAM: MRI HEAD WITHOUT  CONTRAST TECHNIQUE: Multiplanar, multiecho pulse sequences of the brain and surrounding structures were obtained without intravenous contrast. COMPARISON:  MRI 12/19/2003, correlation is made with 07/30/2021 CT head. FINDINGS: Brain: Restricted diffusion with ADC correlate involving the medial left occipital lobe and posterior left hippocampus (series 9, image 66), with additional smaller areas of restricted diffusion in the medial right thalamus (series 9, image 72), left frontal lobe (series 9, images 77, 79, 88, 89), right frontal lobe (series 9, image 82, 87, 89), right parietal lobe (series 9, image 80), and right cerebellum (series 9, image 59). These are concerning for acute infarcts, both in a left PCA territory and in an embolic pattern. Encephalomalacia and increased T2 signal in the medial right occipital lobe and posterior hippocampus, as well as left frontoparietal lobes, likely sequela of remote infarcts. No acute hemorrhage,  mass, mass effect, or midline shift. No extra-axial collection or hydrocephalus. Vascular: Normal flow voids. Skull and upper cervical spine: Normal marrow signal. Sinuses/Orbits: Negative. Other: Trace fluid in bilateral mastoid air cells. IMPRESSION: 1. Restricted diffusion in the medial left occipital lobe and posterior left hippocampus, most likely an acute left PCA territory infarct. 2. Additional more punctate areas of restricted diffusion in the bilateral cerebral and right cerebellar hemisphere, as well as the medial right thalamus, concerning for additional embolic etiology. 3. Encephalomalacia in the medial right occipital lobe, right posterior hippocampus, and left frontoparietal region, likely sequela of remote infarcts. These results were called by telephone at the time of interpretation on 07/31/2021 at 12:58 am to provider MOLPUS, who verbally acknowledged these results. Electronically Signed   By: Merilyn Baba M.D.   On: 07/31/2021 00:58   ECHOCARDIOGRAM  COMPLETE  Result Date: 07/31/2021    ECHOCARDIOGRAM REPORT   Patient Name:   Dorothy Alvarez Date of Exam: 07/31/2021 Medical Rec #:  [id number]   Height:       66.0 in Accession #:    [id number]  Weight:       224.9 lb Date of Birth:  Sep 23, 1965   BSA:          2.102 m Patient Age:    1 years    BP:           168/130 mmHg [name]           HR:           102 bpm. Exam Location:  Inpatient Procedure: 2D Echo, Color Doppler, Cardiac Doppler and Intracardiac            Opacification Agent Indications:    Stroke i63.9  History:        Patient has prior history of Echocardiogram examinations, most                 recent 05/02/2020. CHF; Risk Factors:Hypertension.  Sonographer:    Raquel Sarna Senior RDCS Referring Phys: [place]  1. Left ventricular ejection fraction, by estimation, is at best 20%. The left ventricle has severely decreased function. The left ventricle demonstrates global hypokinesis. There is mild left ventricular hypertrophy. Left ventricular diastolic parameters are indeterminate. There is LV trabeculation without thrombus.  2. Right ventricular systolic function is moderately reduced. The right ventricular size is normal. There is severely elevated pulmonary artery systolic pressure. The estimated right ventricular systolic pressure is 40.1 mmHg.  3. Left atrial size was mildly dilated.  4. The mitral valve is normal in structure. Mild mitral valve regurgitation. No evidence of mitral stenosis.  5. Tricuspid valve regurgitation is moderate to severe.  6. The aortic valve is tricuspid. There is mild thickening of the aortic valve. Aortic valve regurgitation is not visualized. Aortic valve sclerosis is present, with no evidence of aortic valve stenosis.  7. The inferior vena cava is dilated in size with <50% respiratory variability, suggesting right atrial pressure of 15 mmHg. Comparison(s): A prior study was performed on 05/02/2020. Further decrease in LV function. FINDINGS   Left Ventricle: Left ventricular ejection fraction, by estimation, is 20%. The left ventricle has severely decreased function. The left ventricle demonstrates global hypokinesis. Definity contrast agent was given IV to delineate the left ventricular endocardial borders. The left ventricular internal cavity size was normal in size. There is mild left ventricular hypertrophy. Left ventricular diastolic parameters are indeterminate. Right Ventricle: The right ventricular size is normal. No increase  in right ventricular wall thickness. Right ventricular systolic function is moderately reduced. There is severely elevated pulmonary artery systolic pressure. The tricuspid regurgitant velocity is 3.56 m/s, and with an assumed right atrial pressure of 15 mmHg, the estimated right ventricular systolic pressure is 16.1 mmHg. Left Atrium: Left atrial size was mildly dilated. Right Atrium: Right atrial size was normal in size. Pericardium: Trivial pericardial effusion is present. Mitral Valve: The mitral valve is normal in structure. Mild mitral valve regurgitation. No evidence of mitral valve stenosis. Tricuspid Valve: The tricuspid valve is normal in structure. Tricuspid valve regurgitation is moderate to severe. Aortic Valve: The aortic valve is tricuspid. There is mild thickening of the aortic valve. Aortic valve regurgitation is not visualized. Aortic valve sclerosis is present, with no evidence of aortic valve stenosis. Pulmonic Valve: The pulmonic valve was not well visualized. Pulmonic valve regurgitation is not visualized. No evidence of pulmonic stenosis. Aorta: The aortic root and ascending aorta are structurally normal, with no evidence of dilitation. Venous: The inferior vena cava is dilated in size with less than 50% respiratory variability, suggesting right atrial pressure of 15 mmHg. IAS/Shunts: The atrial septum is grossly normal.  LEFT VENTRICLE PLAX 2D LVIDd:         4.90 cm   Diastology LVIDs:         3.90 cm    LV e' medial:    2.93 cm/s LV PW:         1.20 cm   LV E/e' medial:  25.6 LV IVS:        1.20 cm   LV e' lateral:   3.08 cm/s LVOT diam:     2.00 cm   LV E/e' lateral: 24.4 LV SV:         28 LV SV Index:   13 LVOT Area:     3.14 cm  RIGHT VENTRICLE RV S prime:     7.05 cm/s TAPSE (M-mode): 1.4 cm LEFT ATRIUM             Index        RIGHT ATRIUM           Index LA diam:        3.70 cm 1.76 cm/m   RA Area:     20.10 cm LA Vol (A2C):   80.4 ml 38.25 ml/m  RA Volume:   59.00 ml  28.07 ml/m LA Vol (A4C):   75.4 ml 35.87 ml/m LA Biplane Vol: 79.4 ml 37.77 ml/m  AORTIC VALVE LVOT Vmax:   72.50 cm/s LVOT Vmean:  46.800 cm/s LVOT VTI:    0.090 m  AORTA Ao Root diam: 3.10 cm Ao Asc diam:  3.60 cm MITRAL VALVE               TRICUSPID VALVE MV Area (PHT): 6.96 cm    TR Peak grad:   50.7 mmHg MV Decel Time: 109 msec    TR Vmax:        356.00 cm/s MV E velocity: 75.00 cm/s MV A velocity: 46.70 cm/s  SHUNTS MV E/A ratio:  1.61        Systemic VTI:  0.09 m                            Systemic Diam: 2.00 cm Rudean Haskell MD Electronically signed by Rudean Haskell MD Signature Date/Time: 07/31/2021/1:08:43 PM    Final    CT Renal Joaquim Lai Study  Result Date: 07/30/2021 CLINICAL DATA:  Intermittent nausea and vomiting for 2 weeks EXAM: CT ABDOMEN AND PELVIS WITHOUT CONTRAST TECHNIQUE: Multidetector CT imaging of the abdomen and pelvis was performed following the standard protocol without IV contrast. COMPARISON:  10/11/2012 FINDINGS: Lower chest: No acute pleural or parenchymal lung disease. Hepatobiliary: Unremarkable unenhanced appearance of the liver and gallbladder. Pancreas: Unremarkable unenhanced appearance. Spleen: Unremarkable unenhanced appearance. Adrenals/Urinary Tract: There is a 4.1 by 4.7 by 3.2 cm rounded structure interposed between the stomach and ventral aspect upper pole left kidney. Given location, this could reflect gastric diverticulum, pancreatic tail mass, or exophytic renal mass.  Nonemergent follow-up abdominal CT with IV and oral contrast is recommended. No urinary tract calculi or obstructive uropathy within either kidney. The adrenals are unremarkable. Intramural gas is seen throughout the bladder consistent with emphysematous cystitis. Stomach/Bowel: No bowel obstruction or ileus. Normal appendix right lower quadrant. Sigmoid diverticulosis without diverticulitis. No bowel wall thickening or inflammatory change. Vascular/Lymphatic: Aortic atherosclerosis. No enlarged abdominal or pelvic lymph nodes. Reproductive: Status post hysterectomy. No adnexal masses. Other: No free fluid or free gas.  No abdominal wall hernia. Musculoskeletal: No acute or destructive bony lesions. Reconstructed images demonstrate no additional findings. IMPRESSION: 1. Emphysematous cystitis. 2. 4.7 cm rounded soft tissue structure interposed between the gastric fundus and the upper pole left kidney. Given location, gastric diverticulum, pancreatic tail mass, or exophytic renal mass could give this appearance. Nonemergent follow-up CT of the abdomen with IV and oral contrast is recommended. 3. Sigmoid diverticulosis without diverticulitis. 4.  Aortic Atherosclerosis (ICD10-I70.0). Critical Value/emergent results were called by telephone at the time of interpretation on 07/30/2021 at 11:42 pm to provider DR Lorin Glass, who verbally acknowledged these results. Electronically Signed   By: Randa Ngo M.D.   On: 07/30/2021 23:42        Scheduled Meds:   stroke: mapping our early stages of recovery book   Does not apply Once   aspirin  300 mg Rectal Daily   Or   aspirin  325 mg Oral Daily   enoxaparin (LOVENOX) injection  40 mg Subcutaneous Q24H   naLOXone (NARCAN)  injection  0.4 mg Intravenous Once   Continuous Infusions:  [START ON 08/01/2021] cefTRIAXone (ROCEPHIN)  IV     lactated ringers 150 mL/hr at 07/31/21 0120     LOS: 1 day    Time spent: 75mins    Kathie Dike, MD [facility]   If 7PM-7AM, please contact night-coverage [email or web address]  07/31/2021, 1:55 PM

## 2021-07-31 NOTE — ED Notes (Signed)
RT called for ABG at this time.

## 2021-07-31 NOTE — Progress Notes (Addendum)
eLink Physician-Brief Progress Note [name] DOB: May 21, 1966 MRN: [id number]   Date of Service  07/31/2021  HPI/Events of Note  Patient  admitted from outside hospital with altered mental status, left PCA territory ischemic CVA, uncontrolled hypertension, fever r/o sepsis, rhabdomyolysis, in the context of a history of advanced cardiomyopathy with systolic dysfunction, and possible oversedation from chronic narcotic and psychotropic medication use. Lactic acid down to 4.1 from 5.3, K+ 3.3.  eICU Interventions  New Patient Evaluation. Order to replete K+ entered.        Kerry Kass Galen Malkowski 07/31/2021, 8:35 PM

## 2021-07-31 NOTE — Progress Notes (Addendum)
Looks like the occipital stroke seen on CT appears at least subacute (not chronic) on MRI brain with hyperintense findings on DWI.  Correction: R occipital may be subacute to chronic.  But has what looks like acute L occipital infarct on MRI that they didn't call on CT scan.  Updating neurology on findings.  Called Daughter with update.

## 2021-07-31 NOTE — ED Notes (Signed)
[facility] called to transport pt 

## 2021-07-31 NOTE — Progress Notes (Signed)
eLink Physician-Brief Progress Note [name] DOB: Sep 12, 1965 MRN: [id number]   Date of Service  07/31/2021  HPI/Events of Note  ABG reviewed.  eICU Interventions  Patient placed on O2 via nasal cannula at 2 L / min.        Kerry Kass Eriko Economos 07/31/2021, 11:37 PM

## 2021-07-31 NOTE — Progress Notes (Signed)
EEG complete - results pending 

## 2021-07-31 NOTE — Progress Notes (Signed)
PT now at [facility]. Still Not really waking up. Getting set up w/ IVF now. EEG s currently being intimated Given  her limited LOC, renal failure, rising acidosis  and known Underlying HFrEF we will move her to the ICU to expedite her neuro-eval as well as provide closer observation.   Erick Colace ACNP-BC [place] Pager # [phone] OR # [phone] if no answer

## 2021-07-31 NOTE — Consult Note (Addendum)
NAME:  Dorothy Alvarez, MRN:  [id number], DOB:  1965/10/22, LOS: 1 ADMISSION DATE:  07/30/2021, CONSULTATION DATE:  12/14  REFERRING MD:  Dr. Roderic Palau, CHIEF COMPLAINT:  Lethargy   History of Present Illness:  55 y/o F who presented to [facility] ER on 12/13 with reports of lethargy/somnolence by family.   Her husband, Dorothy Alvarez, reports the patient was largely last "normal" in September of 2022.  She had a reported medication change after September from Hydrocodone to Oxycodone.  However, there are no records of this change in Care Everywhere or PDMP.  He does not know the name of her her primary care MD.  He states over the past few months if she was up moving around she was ok but as soon as she sat down went to sleep.  He has noted that she will get up in the middle of the night to take pain medication.  He reports she has been sleeping more in the last two weeks.  The day prior to admit, she was found on the toilet and had been sitting there for an unknown period of time.  Am of 12/13, they had a difficult time getting her up prompting ER evaluation.   Initial ER evaluation notable for altered mental status & hypertensive (initial pressure 187/135).  Lab work up showed leukocytosis with concern for SIRS, AKI, rhabdomyolysis (CK 8,816 > 5,023), lactic acidosis (3.6 to 5.3) and elevated PCT 1.24.  ABG within normal limits. UDS negative.  COVID and Influenza testing negative.  Initial imaging included a CT Renal Stone Study showed emphysematous cystitis and 4.7 cm of rounded soft tissue structure between the gastric fundus and left upper pole of the kidney, negative for renal calculi / obstructing stone.  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  Follow up MRI of the brain showed a diffusion defect concerning for acute left PCA territory infarct.  CXR negative.  ECHO showed an LVEF of ~ 20%, LV with global  hypokinesis, RV systolic function moderately reduced with RVSP ~65, LA mildly dilated, moderate to severe TVR. The patient was treated with gentle IVF's, empiric antibiotics (initially cefepime/vanco then transitioned to ceftriaxone) and IV labetalol for blood pressure control.    PCCM consulted for evaluation of rising lactic acid, concern for possible developing sepsis. At time of my exam, [facility] arrived [facility] transport [facility] to [facility].   Pertinent  Medical History  HTN Chronic Combined Systolic & Diastolic CHF  Bipolar Disorder  Anxiety / Depression  IBS  Learning Disability   Significant Hospital Events: Including procedures, antibiotic start and stop dates in addition to other pertinent events   12/13 Admit with lethargy/somnolence.  UTI, SIRS, new ischemic stroke, AKI, hypertensive  Interim History / Subjective:  As above Tmax 101.1   Objective   Blood pressure (!) 171/120, pulse (!) 106, temperature (!) 101.1 F (38.4 C), temperature source Rectal, resp. rate 19, height 5\' 6"  (1.676 m), weight 102 kg, SpO2 100 %.        Intake/Output Summary (Last 24 hours) at 07/31/2021 1510 Last data filed at 07/31/2021 0547 Gross per 24 hour  Intake 2600.28 ml  Output --  Net 2600.28 ml   Filed Weights   07/31/21 0722  Weight: 102 kg    Examination: General: adult female lying in bed with eyes closed in NAD, husband at bedside HENT: resists eye opening, mild fluttering of eye lids, MM  pink/dry, no JVD Lungs: non-labored at rest on RA, lungs bilaterally clear with good air entry  Cardiovascular: S1S2 RRR, ST on monitor, no appreciable m/r/g Abdomen: round/soft, non-distended, bsx4 active  Extremities: warm/dry, dry skin on LE's Neuro: lying in bed with eyes closed, answers to name, short answers, follows commands - thumbs up,rolled self over in bed, resists to eye exam GU: diaper in place   Resolved Hospital Problem list     Assessment & Plan:   Acute Metabolic  Encephalopathy Multifactorial in setting of AKI, metabolic / lactic acidosis, concern for home medications of seroquel / gabapentin, narcotics, fever and possible low flow state with EF 20%. ABG w/o hypercarbia. UDS negative.  -supportive care  -limit all sedating medications  -await ammonia  -await medication review from pharmacy (not clear where she is getting her medications, care)  Acute L PCA CVA -transfer pending to [facility] for Neurology evaluation  -ECHO reviewed without thrombus -follow frequent neuro exams -ASA  -further stroke work up per Neurology   Sepsis with Possible UTI  Emphysematous Cystitis  Emphysematous cystitis on CT renal study, no obstructing stone  -continue ceftriaxone -follow blood, urine cultures   AKI  Rhabdomyolysis  Metabolic Acidosis / Lactic Acidosis Initial CK >8k, suspect poor clearance of lactate given AKI.  Mild ketones, ? Starvation ketosis from poor intake.  -LR at 27ml/hr, will need to be judicious with IVF's given LVEF  (note no IVF infusing in ER on my exam) -hold home medications benazepril, amlodipine  -Trend BMP / urinary output -Replace electrolytes as indicated -Avoid nephrotoxic agents, ensure adequate renal perfusion -send urine sodium, creatinine for FENa -follow up CMP, lactic acid at 1730 > not hypotensive or hypoxic.  ? Low flow state from EF  Chronic Combined CHF  Pulmonary HTN  Question if the presenting constellation of symptoms would not be unified by low flow state, narcotic use, rhabdo -gentle IVF as above -tele monitoring  -ideally would like an SvO2 but no access to draw -may need advanced CHF team evaluation  Hypertension -PRN labetalol  -hold home amlodipine, benazepril   Bipolar Depression  Chronic Pain  -hold home medications  -discussed agents she uses with husband > not able to pull up in PDMP   Possible Abdominal Mass  Incidental finding of 4.7 cm soft tissue mass -will need follow up imaging with IV and  oral contrast, hold for now given AKI   Best Practice (right click and "Reselect all SmartList Selections" daily)  Diet/type: NPO DVT prophylaxis: LMWH GI prophylaxis: N/A Lines: N/A Foley:  N/A Code Status:  full code Last date of multidisciplinary goals of care discussion: plan of care discussed with husband at bedside.   Labs   CBC: Recent Labs  Lab 07/30/21 1712 07/31/21 0344  WBC 18.1* 22.5*  NEUTROABS 14.9*  --   HGB 16.1* 15.0  HCT 47.1* 43.8  MCV 92.4 92.6  PLT 213 759    Basic Metabolic Panel: Recent Labs  Lab 07/30/21 1712 07/31/21 0344  NA 144 144  K 3.2* 3.5  CL 104 107  CO2 23 22  GLUCOSE 190* 158*  BUN 19 16  CREATININE 1.73* 1.24*  CALCIUM 9.6 8.4*   GFR: Estimated Creatinine Clearance: 61.1 mL/min (A) (by C-G formula based on SCr of 1.24 mg/dL (H)). Recent Labs  Lab 07/30/21 1712 07/30/21 2137 07/31/21 0112 07/31/21 0344 07/31/21 0901 07/31/21 1303  PROCALCITON  --   --  1.24  --   --   --  WBC 18.1*  --   --  22.5*  --   --   LATICACIDVEN 4.5*   < > 3.7* 3.4* 3.6* 5.3*   < > = values in this interval not displayed.    Liver Function Tests: Recent Labs  Lab 07/30/21 1712 07/31/21 0344  AST 107* 80*  ALT 36 33  ALKPHOS 87 73  BILITOT 1.7* 2.0*  PROT 8.1 7.2  ALBUMIN 4.5 4.0   No results for input(s): LIPASE, AMYLASE in the last 168 hours. No results for input(s): AMMONIA in the last 168 hours.  ABG    Component Value Date/Time   PHART 7.443 07/31/2021 1350   PCO2ART 31.9 (L) 07/31/2021 1350   PO2ART 68.1 (L) 07/31/2021 1350   HCO3 21.5 07/31/2021 1350   TCO2 33 (H) 05/04/2020 1005   ACIDBASEDEF 1.3 07/31/2021 1350   O2SAT 92.6 07/31/2021 1350     Coagulation Profile: No results for input(s): INR, PROTIME in the last 168 hours.  Cardiac Enzymes: Recent Labs  Lab 07/30/21 1712 07/31/21 0344  CKTOTAL 8,816* 5,023*    HbA1C: Hgb A1c MFr Bld  Date/Time Value Ref Range Status  07/31/2021 03:44 AM 6.4 (H) 4.8 -  5.6 % Final    Comment:    (NOTE) Pre diabetes:          5.7%-6.4%  Diabetes:              >6.4%  Glycemic control for   <7.0% adults with diabetes   10/21/2018 06:42 AM 5.6 4.8 - 5.6 % Final    Comment:    (NOTE) Pre diabetes:          5.7%-6.4% Diabetes:              >6.4% Glycemic control for   <7.0% adults with diabetes     CBG: Recent Labs  Lab 07/30/21 1654  GLUCAP 189*    Review of Systems:   Unable to complete with patient. Information obtained from husband at bedside, staff and prior medical documentation.   Past Medical History:  She,  has a past medical history of Allergy, Anxiety, Arthritis, Chronic abdominal pain, Constipation, Depression, Hypertension, IBS (irritable bowel syndrome), Learning disability, and Neuromuscular disorder ([place]).   Surgical History:   Past Surgical History:  Procedure Laterality Date   ABDOMINAL HYSTERECTOMY     GALLBLADDER SURGERY     KNEE SURGERY     RIGHT/LEFT HEART CATH AND CORONARY ANGIOGRAPHY N/A 05/04/2020   Procedure: RIGHT/LEFT HEART CATH AND CORONARY ANGIOGRAPHY;  Surgeon: Belva Crome, MD;  Location: [facility];  Service: Cardiovascular;  Laterality: N/A;     Social History:   reports that she has never smoked. She has never used smokeless tobacco. She reports current drug use. Drugs: Other-see comments and Marijuana. She reports that she does not drink alcohol.   Family History:  Her family history includes Depression in her father; Diabetes in her mother; Heart disease in her father; Suicidality in her mother.   Allergies Allergies  Allergen Reactions   Ketorolac Other (See Comments)    Anxiety and confusion   Morphine And Related Other (See Comments)    Mental status changes (confusion and anxiety)   Nsaids Other (See Comments)    Unknown reaction per [facility]   Penicillins Itching    Has patient had a PCN reaction causing immediate rash, facial/tongue/throat swelling, SOB or lightheadedness  with hypotension: Yes Has patient had a PCN reaction causing severe rash involving mucus membranes or  skin necrosis: No Has patient had a PCN reaction that required hospitalization No Has patient had a PCN reaction occurring within the last 10 years: No If all of the above answers are "NO", then may proceed with Cephalosporin use.    Trazodone And Nefazodone     Restless and opposite effect.      Home Medications  Prior to Admission medications   Medication Sig Start Date End Date Taking? Authorizing Provider  amLODipine-benazepril (LOTREL) 10-20 MG capsule Take 1 capsule by mouth daily. 05/18/21   [provider]  furosemide (LASIX) 80 MG tablet Take 1 tablet (80 mg total) by mouth daily. 05/04/20 07/31/22  Florencia Reasons, MD  gabapentin (NEURONTIN) 800 MG tablet Take 800 mg by mouth 3 (three) times daily. 06/26/21   [provider]  oxyCODONE (ROXICODONE) 15 MG immediate release tablet Take 15 mg by mouth 5 (five) times daily as needed. 07/20/21   [provider]  QUEtiapine (SEROQUEL) 300 MG tablet Take 600 mg by mouth at bedtime. 03/17/20   [provider]  tiZANidine (ZANAFLEX) 4 MG tablet Take 4 mg by mouth See admin instructions. Take 6mg  (1.5 tablets) by mouth every 6 hours as needed for muscle spasms 07/22/21   [provider]         Noe Gens, MSN, APRN, NP-C, AGACNP-BC [facility] 07/31/2021, 3:10 PM   Please see [email or web address] for pager details.   From 7A-7P if no response, please call [phone] After hours, please call Warren Lacy [phone]   Pulmonary critical care attending:  This is a 55 year old female, past medical history of chronic systolic heart failure, bipolar disease, chronic opiate use.  Recently had new opiate regimen.  Per husband at bedside states that she had become more confused at home.  Was found on the commode for an unknown period of the time.  MRI of the brain in the ER shows acute left PCA  territory infarct.  She has echocardiogram with severely depressed EF and elevated RVSP with biventricular failure.  Her O2 saturations are stable vital signs stable.  Hypertensive actually in the ER however she is somnolent.  Has eye fluttering does not really open her eyes with command.  Febrile with T-max 101.1  BP (!) 143/125 (BP Location: Left Wrist)    Pulse 91    Temp 97.9 F (36.6 C) (Axillary)    Resp 18    Ht 5\' 6"  (1.676 m)    Wt 102 kg    SpO2 95%    BMI 36.29 kg/m   General: Middle-aged female somnolent lethargic lying in bed responsive to voice and stimulation, will not open eyes HEENT: Attempt to keep eyes closed, does have some fluttering of the upper eyelids NCAT Heart: Regular rhythm S1-S2 Lungs: Clear to auscultation bilaterally diminished breath sounds in the bases poor effort Abdomen: Soft nontender nondistended  Labs: Reviewed elevated CK, sodium 144, creatinine 1.24, lactic acidosis slowly getting worse 3.4, 3.6, 5.3, white blood cell count 22,000  Assessment: Acute metabolic encephalopathy, multifactorial in the setting of AKI, lactic acidosis Acute left PCA infarct, sepsis, possible UTI, emphysematous cystitis Severe sepsis secondary to above AKI, rhabdomyolysis Lactic acidosis secondary to above - Unclear if this is related to this prolonged period of sitting on the commode. Acute systolic biventricular heart failure, appears that she also has a mixed component of sepsis and potential low-flow cardiac state she does not however appear to be significantly volume overloaded at the moment.  Plan: Patient was examined  prior to her planned transfer to [facility] for neurology evaluation. She is currently headed to a neurologic PCU floor. Her vital signs are stable but I do think her critical care services at [facility] need to see the patient upon arrival and ensure over the next couple of hours she is stabilizing and/or improving. If her lab work continues to worsen I  think there are low threshold for her to moved to the intensive care unit there. I think we could be dealing with low flow state with mixed sepsis picture. Continue antimicrobials If her lactic acidosis is worsening may consider central line placement and Co. ox. We will also order an EEG. I suspect with what appears to be embolic stroke would need to discuss utility of anticoagulation with neurology. Consult urology for air found within the bladder wall.  This patient is critically ill with multiple organ system failure; which, requires frequent high complexity decision making, assessment, support, evaluation, and titration of therapies. This was completed through the application of advanced monitoring technologies and extensive interpretation of multiple databases. During this encounter critical care time was devoted to patient care services described in this note for 36 minutes.   [facility] 07/31/2021 5:46 PM

## 2021-08-01 ENCOUNTER — Inpatient Hospital Stay (HOSPITAL_COMMUNITY): Payer: Medicare HMO

## 2021-08-01 ENCOUNTER — Inpatient Hospital Stay: Payer: Self-pay

## 2021-08-01 DIAGNOSIS — R4182 Altered mental status, unspecified: Secondary | ICD-10-CM

## 2021-08-01 DIAGNOSIS — I639 Cerebral infarction, unspecified: Secondary | ICD-10-CM

## 2021-08-01 DIAGNOSIS — N179 Acute kidney failure, unspecified: Secondary | ICD-10-CM

## 2021-08-01 DIAGNOSIS — F3181 Bipolar II disorder: Secondary | ICD-10-CM

## 2021-08-01 DIAGNOSIS — A419 Sepsis, unspecified organism: Secondary | ICD-10-CM

## 2021-08-01 LAB — CSF CELL COUNT WITH DIFFERENTIAL
RBC Count, CSF: 0 /mm3
RBC Count, CSF: 635 /mm3 — ABNORMAL HIGH
Tube #: 1
Tube #: 4
WBC, CSF: 1 /mm3 (ref 0–5)
WBC, CSF: 2 /mm3 (ref 0–5)

## 2021-08-01 LAB — PROTEIN AND GLUCOSE, CSF
Glucose, CSF: 100 mg/dL — ABNORMAL HIGH (ref 40–70)
Total  Protein, CSF: 63 mg/dL — ABNORMAL HIGH (ref 15–45)

## 2021-08-01 LAB — BASIC METABOLIC PANEL
Anion gap: 14 (ref 5–15)
BUN: 21 mg/dL — ABNORMAL HIGH (ref 6–20)
CO2: 21 mmol/L — ABNORMAL LOW (ref 22–32)
Calcium: 8.7 mg/dL — ABNORMAL LOW (ref 8.9–10.3)
Chloride: 112 mmol/L — ABNORMAL HIGH (ref 98–111)
Creatinine, Ser: 1.74 mg/dL — ABNORMAL HIGH (ref 0.44–1.00)
GFR, Estimated: 34 mL/min — ABNORMAL LOW (ref >60)
Glucose, Bld: 154 mg/dL — ABNORMAL HIGH (ref 70–99)
Potassium: 3.6 mmol/L (ref 3.5–5.1)
Sodium: 147 mmol/L — ABNORMAL HIGH (ref 135–145)

## 2021-08-01 LAB — CBC
HCT: 42.1 % (ref 36.0–46.0)
Hemoglobin: 14 g/dL (ref 12.0–15.0)
MCH: 31.3 pg (ref 26.0–34.0)
MCHC: 33.3 g/dL (ref 30.0–36.0)
MCV: 94.2 fL (ref 80.0–100.0)
Platelets: 197 10*3/uL (ref 150–400)
RBC: 4.47 MIL/uL (ref 3.87–5.11)
RDW: 13.7 % (ref 11.5–15.5)
WBC: 23.5 10*3/uL — ABNORMAL HIGH (ref 4.0–10.5)
nRBC: 0 % (ref 0.0–0.2)

## 2021-08-01 LAB — MAGNESIUM: Magnesium: 1.9 mg/dL (ref 1.7–2.4)

## 2021-08-01 MED ORDER — MIDAZOLAM HCL 2 MG/2ML IJ SOLN
INTRAMUSCULAR | Status: AC
  Administered 2021-08-01: 8 mg via INTRAVENOUS
  Filled 2021-08-01: qty 2

## 2021-08-01 MED ORDER — FENTANYL CITRATE PF 50 MCG/ML IJ SOSY
PREFILLED_SYRINGE | INTRAMUSCULAR | Status: AC
  Filled 2021-08-01: qty 1

## 2021-08-01 MED ORDER — FENTANYL CITRATE PF 50 MCG/ML IJ SOSY: 150.0000 ug | PREFILLED_SYRINGE | Freq: Once | INTRAMUSCULAR | Status: AC

## 2021-08-01 MED ORDER — FENTANYL CITRATE PF 50 MCG/ML IJ SOSY
PREFILLED_SYRINGE | INTRAMUSCULAR | Status: AC
  Administered 2021-08-01: 150 ug via INTRAVENOUS
  Filled 2021-08-01: qty 2

## 2021-08-01 MED ORDER — ENOXAPARIN SODIUM 40 MG/0.4ML IJ SOSY
40.0000 mg | PREFILLED_SYRINGE | INTRAMUSCULAR | Status: DC
  Administered 2021-08-02 – 2021-08-05 (×4): 40 mg via SUBCUTANEOUS
  Filled 2021-08-01 (×4): qty 0.4

## 2021-08-01 MED ORDER — CLEVIDIPINE BUTYRATE 0.5 MG/ML IV EMUL
0.0000 mg/h | INTRAVENOUS | Status: DC
  Administered 2021-08-01: 11 mg/h via INTRAVENOUS
  Administered 2021-08-01: 6 mg/h via INTRAVENOUS
  Administered 2021-08-01: 5 mg/h via INTRAVENOUS
  Administered 2021-08-01: 1 mg/h via INTRAVENOUS
  Filled 2021-08-01 (×3): qty 50

## 2021-08-01 MED ORDER — ASPIRIN 325 MG PO TABS
325.0000 mg | ORAL_TABLET | Freq: Every day | ORAL | Status: DC
  Administered 2021-08-03: 325 mg via ORAL
  Filled 2021-08-01 (×2): qty 1

## 2021-08-01 MED ORDER — LABETALOL HCL 5 MG/ML IV SOLN
10.0000 mg | INTRAVENOUS | Status: DC | PRN
  Administered 2021-08-02 – 2021-08-05 (×9): 10 mg via INTRAVENOUS
  Administered 2021-08-06: 11:00:00 5 mg via INTRAVENOUS
  Administered 2021-08-06: 05:00:00 10 mg via INTRAVENOUS
  Administered 2021-08-06: 11:00:00 5 mg via INTRAVENOUS
  Filled 2021-08-01 (×10): qty 4

## 2021-08-01 MED ORDER — POTASSIUM CHLORIDE 10 MEQ/100ML IV SOLN
10.0000 meq | INTRAVENOUS | Status: AC
  Administered 2021-08-01 (×4): 10 meq via INTRAVENOUS
  Filled 2021-08-01 (×4): qty 100

## 2021-08-01 MED ORDER — MIDAZOLAM HCL 2 MG/2ML IJ SOLN: 8.0000 mg | Freq: Once | INTRAMUSCULAR | Status: AC

## 2021-08-01 MED ORDER — MAGNESIUM SULFATE 2 GM/50ML IV SOLN
2.0000 g | Freq: Once | INTRAVENOUS | Status: AC
  Administered 2021-08-01: 2 g via INTRAVENOUS
  Filled 2021-08-01: qty 50

## 2021-08-01 MED ORDER — MIDAZOLAM HCL 2 MG/2ML IJ SOLN
INTRAMUSCULAR | Status: AC
  Filled 2021-08-01: qty 4

## 2021-08-01 MED ORDER — CHLORHEXIDINE GLUCONATE 0.12 % MT SOLN
15.0000 mL | Freq: Two times a day (BID) | OROMUCOSAL | Status: DC
  Administered 2021-08-01 – 2021-08-14 (×27): 15 mL via OROMUCOSAL
  Filled 2021-08-01 (×24): qty 15

## 2021-08-01 MED ORDER — MIDAZOLAM HCL 2 MG/2ML IJ SOLN
INTRAMUSCULAR | Status: AC
  Administered 2021-08-01: 2 mg
  Filled 2021-08-01: qty 2

## 2021-08-01 MED ORDER — ORAL CARE MOUTH RINSE
15.0000 mL | Freq: Two times a day (BID) | OROMUCOSAL | Status: DC
  Administered 2021-08-01 – 2021-08-13 (×24): 15 mL via OROMUCOSAL

## 2021-08-01 MED ORDER — SODIUM CHLORIDE 0.9 % IV SOLN
2.0000 g | Freq: Two times a day (BID) | INTRAVENOUS | Status: DC
  Administered 2021-08-01 – 2021-08-02 (×3): 2 g via INTRAVENOUS
  Filled 2021-08-01 (×3): qty 20

## 2021-08-01 MED ORDER — VANCOMYCIN HCL 2000 MG/400ML IV SOLN
2000.0000 mg | Freq: Once | INTRAVENOUS | Status: AC
  Administered 2021-08-01: 2000 mg via INTRAVENOUS
  Filled 2021-08-01: qty 400

## 2021-08-01 MED ORDER — SODIUM CHLORIDE 0.9 % IV SOLN
2.0000 g | Freq: Four times a day (QID) | INTRAVENOUS | Status: AC
  Administered 2021-08-01 – 2021-08-08 (×28): 2 g via INTRAVENOUS
  Filled 2021-08-01 (×30): qty 2000

## 2021-08-01 MED ORDER — ASPIRIN 300 MG RE SUPP
300.0000 mg | Freq: Every day | RECTAL | Status: DC
Start: 2021-08-01 — End: 2021-08-04
  Administered 2021-08-01 – 2021-08-02 (×2): 300 mg via RECTAL
  Filled 2021-08-01 (×2): qty 1

## 2021-08-01 MED ORDER — NICARDIPINE HCL IN NACL 20-0.86 MG/200ML-% IV SOLN
3.0000 mg/h | INTRAVENOUS | Status: DC
  Administered 2021-08-01: 8 mg/h via INTRAVENOUS
  Administered 2021-08-01: 3 mg/h via INTRAVENOUS
  Filled 2021-08-01 (×2): qty 200

## 2021-08-01 MED ORDER — VANCOMYCIN HCL 1250 MG/250ML IV SOLN
1250.0000 mg | INTRAVENOUS | Status: DC
  Filled 2021-08-01: qty 250

## 2021-08-01 NOTE — TOC CAGE-AID Note (Signed)
Transition of Care [facility]) - CAGE-AID Screening   Patient Details  Name: Dorothy Alvarez MRN: [id number] Date of Birth: 04-Jun-1966  Transition of Care [facility]) CM/SW Contact:    Savvy Peeters C Tarpley-Carter, [place] Phone Number: 08/01/2021, 9:07 AM   Clinical Narrative: Pt is unable to participate in Cage Aid.  Ardelia Wrede Tarpley-Carter, MSW, LCSW-A Pronouns:  She/Her/Hers [place] Transitions of Care Clinical Social Worker Direct Number:  [phone] [email or web address]    CAGE-AID Screening: Substance Abuse Screening unable to be completed due to: : Patient unable to participate             Substance Abuse Education Offered: No

## 2021-08-01 NOTE — Progress Notes (Signed)
Pharmacy Antibiotic Note  Dorothy Alvarez is a 55 y.o. female admitted on 07/30/2021. Pharmacy has been consulted for vancomycin dosing for concern of meningitis and infective endocarditis. Ceftriaxone changed to 2g q12h. CrCl 43 ml/min. TBW 102kg.   Plan: Vancomycin 2000mg  x1 loading dose followed by vancomycin 1250mg  q24h (nomogram dosing for meningitis)  Monitor renal function, cultures, and clinical progression  Vancomycin levels as indicated   Height: 5\' 6"  (167.6 cm) Weight: 102 kg (224 lb 13.9 oz) IBW/kg (Calculated) : 59.3  Temp (24hrs), Avg:98.7 F (37.1 C), Min:97.6 F (36.4 C), Max:101.1 F (38.4 C)  Recent Labs  Lab 07/30/21 1712 07/30/21 2137 07/31/21 0112 07/31/21 0344 07/31/21 0901 07/31/21 1303 07/31/21 1840 08/01/21 0448  WBC 18.1*  --   --  22.5*  --   --   --  23.5*  CREATININE 1.73*  --   --  1.24*  --   --  1.60* 1.74*  LATICACIDVEN 4.5*   < > 3.7* 3.4* 3.6* 5.3* 4.1*  --    < > = values in this interval not displayed.    Estimated Creatinine Clearance: 43.5 mL/min (A) (by C-G formula based on SCr of 1.74 mg/dL (H)).    Allergies  Allergen Reactions   Ketorolac Other (See Comments)    Anxiety and confusion   Morphine And Related Other (See Comments)    Mental status changes (confusion and anxiety)   Nsaids Other (See Comments)    Unknown reaction per [facility]   Penicillins Itching    Has patient had a PCN reaction causing immediate rash, facial/tongue/throat swelling, SOB or lightheadedness with hypotension: Yes Has patient had a PCN reaction causing severe rash involving mucus membranes or skin necrosis: No Has patient had a PCN reaction that required hospitalization No Has patient had a PCN reaction occurring within the last 10 years: No If all of the above answers are "NO", then may proceed with Cephalosporin use.    Trazodone And Nefazodone     Restless and opposite effect.     Antimicrobials this admission: Ceftriaxone 12/14 >>   Vancomycin 12/15 >>   Dose adjustments this admission:   Microbiology results: 12/13 bcx: ngtd  12/13 ucx: reincubated   Thank you for allowing pharmacy to be a part of this patients care.  Cristela Felt, PharmD, BCPS Clinical Pharmacist 08/01/2021 10:10 AM

## 2021-08-01 NOTE — Progress Notes (Addendum)
Per Dr. Tacy Learn, maintain Foley today for AKI/rhabdo/sepsis/emphysematous cystitis. Will reassess TOV tomorrow.

## 2021-08-01 NOTE — Progress Notes (Addendum)
NAME:  Dorothy Alvarez, MRN:  [id number], DOB:  11-05-1965, LOS: 2 ADMISSION DATE:  07/30/2021, CONSULTATION DATE:  12/14  REFERRING MD:  Dr. Roderic Palau, CHIEF COMPLAINT:  Lethargy   History of Present Illness:  55 y/o F who presented to [facility] ER on 12/13 with reports of lethargy/somnolence by family.   Her husband, [name], reports the patient was largely last "normal" in September of 2022.  She had a reported medication change after September from Hydrocodone to Oxycodone.  However, there are no records of this change in Care Everywhere or PDMP.  He does not know the name of her her primary care MD.  He states over the past few months if she was up moving around she was ok but as soon as she sat down went to sleep.  He has noted that she will get up in the middle of the night to take pain medication.  He reports she has been sleeping more in the last two weeks.  The day prior to admit, she was found on the toilet and had been sitting there for an unknown period of time.  Am of 12/13, they had a difficult time getting her up prompting ER evaluation.   Initial ER evaluation notable for altered mental status & hypertensive (initial pressure 187/135).  Lab work up showed leukocytosis with concern for SIRS, AKI, rhabdomyolysis (CK 8,816 > 5,023), lactic acidosis (3.6 to 5.3) and elevated PCT 1.24.  ABG within normal limits. UDS negative.  COVID and Influenza testing negative.  Initial imaging included a CT Renal Stone Study showed emphysematous cystitis and 4.7 cm of rounded soft tissue structure between the gastric fundus and left upper pole of the kidney, negative for renal calculi / obstructing stone.  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  Follow up MRI of the brain showed a diffusion defect concerning for acute left PCA territory infarct.  CXR negative.  ECHO showed an LVEF of ~ 20%, LV with global  hypokinesis, RV systolic function moderately reduced with RVSP ~65, LA mildly dilated, moderate to severe TVR. The patient was treated with gentle IVF's, empiric antibiotics (initially cefepime/vanco then transitioned to ceftriaxone) and IV labetalol for blood pressure control.    PCCM consulted for evaluation of rising lactic acid, concern for possible developing sepsis. At time of my exam, [facility] arrived to transport patient to [facility].   Pertinent  Medical History  HTN Chronic Combined Systolic & Diastolic CHF  Bipolar Disorder  Anxiety / Depression  IBS  Learning Disability   Significant Hospital Events: Including procedures, antibiotic start and stop dates in addition to other pertinent events   12/13 Admit with lethargy/somnolence.  UTI, SIRS, new ischemic stroke, AKI, hypertensive 12/15 no sz on EEG   Interim History / Subjective:  No seizure on EEG  Cr incr from 1.6 to 1.74 WBC 23   Remains on cardene gtt  -- SBPs 140s this morning   Objective   Blood pressure (!) 170/104, pulse (!) 101, temperature 99.1 F (37.3 C), temperature source Axillary, resp. rate (!) 23, height 5\' 6"  (1.676 m), weight 102 kg, SpO2 100 %.        Intake/Output Summary (Last 24 hours) at 08/01/2021 1007 Last data filed at 08/01/2021 0645 Gross per 24 hour  Intake 3335.74 ml  Output 950 ml  Net 2385.74 ml   Filed Weights   07/31/21 0722  Weight: 102 kg  Examination: General: middle aged F reclined in bed NAD  HENT: [place]. Pink mm trachea midline  Lungs: CTAb on RA.  Cardiovascular: tachycardic regular s1s2 no rgm  Abdomen: soft ndnt normoactive x4  Extremities: no acute joint deformity no cyanosis or clubbing  Neuro: Replies "Yes" to name, shouts to pain. Does not follow commands. Resists eye opening. Moving extremities spontaneously  GU: defer   Resolved Hospital Problem list     Assessment & Plan:    Acute metabolic encephalopathy  Likely multifactorial -- polypharmacy at home  + AKI with rhabdo, suspected sepsis, possible low flow state.  Ammonia WNL, UDS neg, not hypercarbic on ABG, no evidence of sz on EEG  P -delirium precautions  -hold home meds  -LP  -- will start empiric tx   Acute L PCA infarct  -no LVO on MRA. 50% L PCa stenosis  P -neuro following -will order for a TEE-- ? 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  Mild ketones, ? Starvation ketosis from poor intake.  FENa prerenal  P -cont IVF, trend CK, renal indices  Bipolar Depression  Chronic Pain  -hold home medications  -discussed agents she uses with husband > not able to pull up in PDMP   Possible Abdominal Mass  Incidental finding of 4.7 cm soft tissue mass -will need follow up imaging with IV and oral contrast, hold for now given AKI   Best Practice (right click and "Reselect all SmartList Selections" daily)  Diet/type: NPO DVT prophylaxis: LMWH GI prophylaxis: N/A Lines: N/A Foley:  N/A Code Status:  full code Last date of multidisciplinary goals of care discussion: 12/14 d/w husband  Labs   CBC: Recent Labs  Lab 07/30/21 1712 07/31/21 0344 08/01/21 0448  WBC 18.1* 22.5* 23.5*  NEUTROABS 14.9*  --   --   HGB 16.1* 15.0 14.0  HCT 47.1* 43.8 42.1  MCV 92.4 92.6 94.2  PLT 213 204 132    Basic Metabolic Panel: Recent Labs  Lab 07/30/21 1712 07/31/21 0344 07/31/21 1840 08/01/21 0448  NA 144 144 145 147*  K 3.2* 3.5 3.3* 3.6  CL 104 107 110 112*  CO2 23 22 21* 21*  GLUCOSE 190* 158* 199* 154*  BUN 19 16 17  21*  CREATININE 1.73* 1.24* 1.60* 1.74*  CALCIUM 9.6 8.4*  8.6* 8.7*  MG  --   --   --  1.9   GFR: Estimated Creatinine Clearance: 43.5 mL/min (A) (by C-G formula based on SCr of 1.74 mg/dL (H)). Recent Labs  Lab 07/30/21 1712 07/30/21 2137 07/31/21 0112 07/31/21 0344 07/31/21 0901 07/31/21 1303 07/31/21 1840 08/01/21 0448  PROCALCITON  --   --  1.24  --   --   --   --   --   WBC 18.1*  --   --  22.5*  --   --   --  23.5*  LATICACIDVEN 4.5*   < > 3.7* 3.4* 3.6* 5.3* 4.1*  --    < > = values in this interval not displayed.    Liver Function Tests: Recent Labs  Lab 07/30/21 1712 07/31/21  0344 07/31/21 1840  AST 107* 80* 73*  ALT 36 33 43  ALKPHOS 87 73 107  BILITOT 1.7* 2.0* 1.5*  PROT 8.1 7.2 6.6  ALBUMIN 4.5 4.0 3.7   No results for input(s): LIPASE, AMYLASE in the last 168 hours. Recent Labs  Lab 07/31/21 1840  AMMONIA 28    ABG    Component Value Date/Time   PHART 7.443 07/31/2021 1350   PCO2ART 31.9 (L) 07/31/2021 1350   PO2ART 68.1 (L) 07/31/2021 1350   HCO3 21.5 07/31/2021 1350   TCO2 33 (H) 05/04/2020 1005   ACIDBASEDEF 1.3 07/31/2021 1350   O2SAT 92.6 07/31/2021 1350     Coagulation Profile: No results for input(s): INR, PROTIME in the last 168 hours.  Cardiac Enzymes: Recent Labs  Lab 07/30/21 1712 07/31/21 0344  CKTOTAL 8,816* 5,023*    HbA1C: Hgb A1c MFr Bld  Date/Time Value Ref Range Status  07/31/2021 03:44 AM 6.4 (H) 4.8 - 5.6 % Final    Comment:    (NOTE) Pre diabetes:          5.7%-6.4%  Diabetes:              >6.4%  Glycemic control for   <7.0% adults with diabetes   10/21/2018 06:42 AM 5.6 4.8 - 5.6 % Final    Comment:    (NOTE) Pre diabetes:          5.7%-6.4% Diabetes:              >6.4% Glycemic control for   <7.0% adults with diabetes     CBG: Recent Labs  Lab 07/30/21 1654  GLUCAP 189*      CRITICAL CARE Performed by: Cristal Generous   Total critical care time: 40 minutes  Critical care time was exclusive of separately billable procedures and treating  other patients.  Critical care was necessary to treat or prevent imminent or life-threatening deterioration.  Critical care was time spent personally by me on the following activities: development of treatment plan with patient and/or surrogate as well as nursing, discussions with consultants, evaluation of patient's response to treatment, examination of patient, obtaining history from patient or surrogate, ordering and performing treatments and interventions, ordering and review of laboratory studies, ordering and review of radiographic studies, pulse oximetry and re-evaluation of patient's condition.  Eliseo Gum MSN, AGACNP-BC [facility] for pager  08/01/2021, 10:07 AM

## 2021-08-01 NOTE — Progress Notes (Addendum)
STROKE TEAM PROGRESS NOTE   INTERVAL HISTORY Patient is seen in her room with no family at bedside.  She presented to the ED at [facility] on 12/13 with progressive altered mental status and lethargy.  MRI was performed reveling and acute left PCA territory infarct, possibly embolic.  She was then transferred here for further evaluation.  EEG reveled no seizure activity.  Patient has required Cleviprex for BP control and was febrile yesterday to 101.1.  Plan is to obtain LP to rule out infectious causes of her altered mental status.  TEE may be performed to rule out endocarditis.  Vitals:   08/01/21 1300 08/01/21 1315 08/01/21 1330 08/01/21 1345  BP: 140/69 (!) 145/82 139/79 (!) 147/84  Pulse: 90 91 93 93  Resp: (!) 26 (!) 21 (!) 23 (!) 30  Temp:      TempSrc:      SpO2: 98% 99% 100% 95%  Weight:      Height:       CBC:  Recent Labs  Lab 07/30/21 1712 07/31/21 0344 08/01/21 0448  WBC 18.1* 22.5* 23.5*  NEUTROABS 14.9*  --   --   HGB 16.1* 15.0 14.0  HCT 47.1* 43.8 42.1  MCV 92.4 92.6 94.2  PLT 213 204 700   Basic Metabolic Panel:  Recent Labs  Lab 07/31/21 1840 08/01/21 0448  NA 145 147*  K 3.3* 3.6  CL 110 112*  CO2 21* 21*  GLUCOSE 199* 154*  BUN 17 21*  CREATININE 1.60* 1.74*  CALCIUM 8.6* 8.7*  MG  --  1.9   Lipid Panel:  Recent Labs  Lab 07/31/21 0344  CHOL 211*  TRIG 118  HDL 46  CHOLHDL 4.6  VLDL 24  LDLCALC 141*   HgbA1c:  Recent Labs  Lab 07/31/21 0344  HGBA1C 6.4*   Urine Drug Screen:  Recent Labs  Lab 07/30/21 2046  LABOPIA NONE DETECTED  COCAINSCRNUR NONE DETECTED  LABBENZ NONE DETECTED  AMPHETMU NONE DETECTED  THCU NONE DETECTED  LABBARB NONE DETECTED    Alcohol Level  Recent Labs  Lab 07/30/21 1712  ETH <10    IMAGING past 24 hours MR ANGIO HEAD WO CONTRAST  Result Date: 08/01/2021 CLINICAL DATA:  Follow-up stroke. Left PCA territory insult. Scattered other embolic distribution infarctions. Old right PCA  territory stroke. EXAM: MRA HEAD WITHOUT CONTRAST TECHNIQUE: Angiographic images of the Circle of Willis were acquired using MRA technique without intravenous contrast. COMPARISON:  MRI yesterday. FINDINGS: Anterior circulation: Both internal carotid arteries are widely patent through the skull base and siphon regions. The anterior and middle cerebral vessels are patent. No large vessel occlusion or correctable proximal stenosis. More distal branch vessels show mild atherosclerotic irregularity. Posterior circulation: Both vertebral arteries are widely patent to the basilar. No basilar stenosis. Left PICA shows flow. Large right anterior inferior cerebellar artery shows flow. Flow is present in both superior cerebellar arteries and both posterior cerebral arteries. Posterior cerebral arteries show considerable atherosclerotic irregularity. On the right, the vessel is diminutive consistent with the old infarction. On the left, there is 50% stenosis at the proximal PCA with distal vessel irregular changes. Anatomic variants: None significant. Other: None. IMPRESSION: No acute large vessel occlusion by MRA. Anterior circulation shows mild distal vessel atherosclerotic irregularity. Posterior circulation shows more extensive atherosclerotic disease in the posterior cerebral artery territories. The right PCA is diminutive and irregular consistent with previous infarction in that territory. The left PCA shows a 50% stenosis just beyond its origin,  with moderate irregularity distal to that, but with patency presently. Electronically Signed   By: Dorothy Alvarez M.D.   On: 08/01/2021 07:55   EEG adult  Result Date: 08/01/2021 Dorothy Havens, MD     08/01/2021  8:22 AM [name] MRN: [id number] Epilepsy Attending: Lora Alvarez Referring Physician/Provider: Kathie Dike, MD Date: 07/31/2021 Duration: 23.07 mins Patient history: 55 year old female with altered mental status.  EEG to evaluate for  seizure. Level of alertness: Awake AEDs during EEG study: None Technical aspects: This EEG study was done with scalp electrodes positioned according to the 10-20 International system of electrode placement. Electrical activity was acquired at a sampling rate of 500Hz  and reviewed with a high frequency filter of 70Hz  and a low frequency filter of 1Hz . EEG data were recorded continuously and digitally stored. Description: EEG showed continuous generalized 3 to 6 Hz theta-delta slowing. Hyperventilation and photic stimulation were not performed.   ABNORMALITY - Continuous slow, generalized IMPRESSION: This study is suggestive of moderate diffuse encephalopathy, nonspecific etiology. No seizures or epileptiform discharges were seen throughout the recording. Dorothy Alvarez   VAS US CAROTID  Result Date: 08/01/2021 Carotid Arterial Duplex Study Patient Name:  Dorothy Alvarez  Date of Exam:   08/01/2021 Medical Rec #: [id number]    Accession #:    [id number] Date of Birth: [birth year]    [name] Patient Age:   66 years Exam Location:  [facility] Procedure:      VAS US CAROTID Referring Phys: Dorothy Alvarez --------------------------------------------------------------------------------  Indications:       CVA and altered mental status. Other Factors:     Uncontrolled hypertension, left PCA territory ischemic CVA.                    History of advanced cardiomyopathy with systolic dysfunction. Comparison Study:  No prior Performing Technologist: Dorothy Alvarez RDMS, RVT  Examination Guidelines: A complete evaluation includes B-mode imaging, spectral Doppler, color Doppler, and power Doppler as needed of all accessible portions of each vessel. Bilateral testing is considered an integral part of a complete examination. Limited examinations for reoccurring indications may be performed as noted.  Right Carotid Findings: +----------+--------+--------+--------+------------------+------------------+              PSV cm/s EDV cm/s Stenosis Plaque Description Comments            +----------+--------+--------+--------+------------------+------------------+  CCA Prox   59       10                                                       +----------+--------+--------+--------+------------------+------------------+  CCA Distal 58       9                                    intimal thickening  +----------+--------+--------+--------+------------------+------------------+  ICA Prox   46       15       1-39%                       intimal thickening  +----------+--------+--------+--------+------------------+------------------+  ICA Mid    40       13                                                       +----------+--------+--------+--------+------------------+------------------+  ICA Distal 42       13                                                       +----------+--------+--------+--------+------------------+------------------+  ECA        39       10                                                       +----------+--------+--------+--------+------------------+------------------+ +----------+--------+-------+----------------+-------------------+             PSV cm/s EDV cms Describe         Arm Pressure (mmHG)  +----------+--------+-------+----------------+-------------------+  Subclavian 142              Multiphasic, WNL                      +----------+--------+-------+----------------+-------------------+ +---------+--------+--+--------+--+---------+  Vertebral PSV cm/s 48 EDV cm/s 14 Antegrade  +---------+--------+--+--------+--+---------+  Left Carotid Findings: +----------+--------+--------+--------+------------------+------------------+             PSV cm/s EDV cm/s Stenosis Plaque Description Comments            +----------+--------+--------+--------+------------------+------------------+  CCA Prox   64       13                                                        +----------+--------+--------+--------+------------------+------------------+  CCA Distal 45       9                                    intimal thickening  +----------+--------+--------+--------+------------------+------------------+  ICA Prox   35       11       1-39%                       intimal thickening  +----------+--------+--------+--------+------------------+------------------+  ICA Mid    43       14                                                       +----------+--------+--------+--------+------------------+------------------+  ICA Distal 52       17                                                       +----------+--------+--------+--------+------------------+------------------+  ECA        39       9                                                        +----------+--------+--------+--------+------------------+------------------+ +----------+--------+--------+----------------+-------------------+  PSV cm/s EDV cm/s Describe         Arm Pressure (mmHG)  +----------+--------+--------+----------------+-------------------+  Subclavian 112               Multiphasic, WNL                      +----------+--------+--------+----------------+-------------------+ +---------+--------+--+--------+--+---------+  Vertebral PSV cm/s 35 EDV cm/s 13 Antegrade  +---------+--------+--+--------+--+---------+   Summary: Right Carotid: Velocities in the right ICA are consistent with a 1-39% stenosis. Left Carotid: Velocities in the left ICA are consistent with a 1-39% stenosis. Vertebrals:  Bilateral vertebral arteries demonstrate antegrade flow. Subclavians: Normal flow hemodynamics were seen in bilateral subclavian              arteries. *See table(s) above for measurements and observations.     Preliminary     PHYSICAL EXAM General:  Patient is a well-developed, well-nourished female in no acute distress  Neuro:  Patient will moan in response to name but will not answer questions or follow commands.  Her  eyes are tightly closed, and she resists opening eyes by examiner.  She will move all four extremities to moxious stimuli.  ASSESSMENT/PLAN Dorothy Alvarez is a 55 y.o. female with history of anxiety, depression, HTN and chronic pain presenting with lethargy and altered mental status.  She presented to the ED at [facility] on 12/13 with progressive altered mental status and lethargy.  MRI was performed reveling and acute left PCA territory infarct, possibly embolic.  She was then transferred here for further evaluation.  EEG reveled no seizure activity.  Patient has required Cleviprex for BP control and was febrile yesterday to 101.1.  Plan is to obtain LP to rule out infectious causes of her altered mental status.  TEE will be performed to rule out endocarditis.  Stroke: Multifocal infarcts including left PCA, right cerebellum, bilateral frontal and right thalamus likely embolic from endocarditis vs. cardiomyopathy with low EF CT head Chronic right occipital lobe infarct and degenerative changes in cervical spine. MRI  acute left PCA territory infarct, punctate areas of restricted diffusion in bilateral cerebral and right cerebellar hemisphere as well as medial right thalamus, encephalomalacia in medial right occipital lobe, right posterior hippocampus and left frontoparietal region MRA  no large vessel occlusion but 50% left PCA stenosis Carotid Doppler  1-39% stenosis in bilateral carotids 2D Echo EF 20%, no LV thrombus EEG moderate diffuse encephalopathy, no seizure LDL 141 HgbA1c 6.4 VTE prophylaxis - Lovenox subcu No antithrombotic prior to admission, now on ASA 300 PR Therapy recommendations:  pending Disposition:  pending  CHF 04/2021 EF 25 to 30%.  Per discharge, patient discharged on Lasix 80, Entresto 49/51 twice daily, Coreg 3.125 twice daily, hydralazine 75 3 times daily and isosorbide 15 twice daily.  However, dose medication was not on patient medication list on admission,  concerning for medication noncompliance. Patient also did not follow-up with cardiology, concerning for noncompliance This admission, EF 20% TEE pending Will recommend DOAC if TEE negative for endocarditis  Encephalopathy Altered mental status for 1 week prior to admission Still not follow commands, nonverbal at this time EEG moderate diffuse encephalopathy, no seizure LP performed and CSF pending  SIRS Fever Tmax 101.1 Tachycardia and hypertension Leukocytosis WBC 18.1-22.5-23.5 On Rocephin Blood culture so far negative Urine culture pending UA WBC 6-10 CSF pending TEE to rule out endocarditis - pending  Hypertension Home meds:  amlodipine-benazepril 10-20 Unstable On Cleviprex Keep BP <180/105 Long-term BP goal  normotensive  Hyperlipidemia Home meds: none LDL 141, goal < 70 Add statin when enteral access established  High intensity statin to be initiated Continue statin at discharge  Other Stroke Risk Factors Obesity, Body mass index is 36.29 kg/m., BMI >/= 30 associated with increased stroke risk, recommend weight loss, diet and exercise as appropriate   Other Active Problems Chronic pain syndrome Anxiety   Hospital day # [place] , MSN, AGACNP-BC [facility] See Amion for schedule and pager information 08/01/2021 3:02 PM   ATTENDING NOTE: I reviewed above note and agree with the assessment and plan. Pt was seen and examined.   55 year old female with history of hypertension, chronic pain, anxiety, CHF with low EF admitted for altered mental status, lethargy, nausea vomiting for 1 week.  CT showed right occipital subacute infarct.  MRI showed large left PCA infarct, small right cerebellum, right thalamic and punctate bilateral frontal infarcts.  EF 20%, MRA left PCA 50% stenosis, carotid Doppler negative.  A1c 6.4, LDL 141.  UDS negative.  EEG no seizure.  Patient also has fever T-max 101.1, leukocytosis WBC 18.1-23.5, AKI  creatinine 1.74, lactic acid 5.3.  Also has tachycardia and hypertension.  Ammonia level 28, blood cultures so far negative, urine analysis WBC 6-10, urine culture pending.  Patient is on Rocephin, vancomycin and ampicillin.  On exam, RN at bedside, patient lying in bed, eyes closed, also resistant to forced eye opening, no facial droop, moving all extremities.  Nonverbal, not answer questions or follow commands.  Etiology for patient altered mental status not quite clear, could be due to encephalopathy from PCA and thalamic stroke, or encephalitis given fever, or endocarditis with embolic strokes.  Recommend LP to rule out meningitis and encephalitis.  TEE to rule out endocarditis.  Okay with aspirin for now, avoid DAPT at this time until endocarditis is ruled out.  Continue antibiotics.  Speech on board, recommend core track if not passing swallow.  Consider statin once p.o. access.  Discussed with Dr. Tacy Learn CCM.   For detailed assessment and plan, please refer to above as I have made changes wherever appropriate.   Rosalin Hawking, MD PhD Stroke Neurology 08/01/2021 6:43 PM  This patient is critically ill due to prolonged altered mental status, stroke, fever and at significant risk of neurological worsening, death form recurrent stroke, hemorrhagic conversion, sepsis, seizure. This patient's care requires constant monitoring of vital signs, hemodynamics, respiratory and cardiac monitoring, review of multiple databases, neurological assessment, discussion with family, other specialists and medical decision making of high complexity. I spent 35 minutes of neurocritical care time in the care of this patient.  I talked with CCM Dr. Tacy Learn    To contact Stroke Continuity provider, please refer to [email or web address] After hours, contact General Neurology

## 2021-08-01 NOTE — Progress Notes (Signed)
Carotid duplex  has been completed. Refer to [facility] under chart review to view preliminary results.   08/01/2021  2:42 PM Dorothy Alvarez, Dorothy Alvarez ;

## 2021-08-01 NOTE — Progress Notes (Signed)
Consent for bedside LP obtained by Dr.Chand from patient's daughter and husband via phone, and witnessed by this RN. Consent placed in shadow chart.

## 2021-08-01 NOTE — Evaluation (Signed)
SLP Cancellation Note  Patient Details Name: Dorothy Alvarez MRN: [id number] DOB: 26-Jul-1966   Cancelled treatment:       Reason Eval/Treat Not Completed: Other (comment);Fatigue/lethargy limiting ability to participate (pt not following directions, moans only, not ready for swallow/speech evaluation.  will continue efforts)  Kathleen Lime, MS [facility] SLP Acute Rehab Services Office [phone] Pager [phone]  Macario Golds 08/01/2021, 9:04 AM

## 2021-08-01 NOTE — Progress Notes (Signed)
OT Cancellation Note  Patient Details Name: Leverne Tessler MRN: [id number] DOB: [birth year]   Cancelled Treatment:    Reason Eval/Treat Not Completed: Patient not medically ready. Nsg requesting to hold at this time due to medical status and possible LP. Will follow up later time.   Ramond Dial, OT/L   Acute OT Clinical Specialist Acute Rehabilitation Services Pager [phone] Office [phone]  08/01/2021, 10:29 AM

## 2021-08-01 NOTE — Progress Notes (Signed)
°  Transition of Care [facility]) Screening Note   Patient Details  Name: Dorothy Alvarez Date of Birth: 1966/07/08   Transition of Care [facility]) CM/SW Contact:    Geralynn Ochs, LCSW Phone Number: 08/01/2021, 2:04 PM    Transition of Care Department [facility]) has reviewed patient and no TOC needs have been identified at this time; medical workup ongoing. We will continue to monitor patient advancement through interdisciplinary progression rounds. If new patient transition needs arise, please place a TOC consult.

## 2021-08-01 NOTE — Progress Notes (Signed)
PT Cancellation Note  Patient Details Name: Dorothy Alvarez MRN: [id number] DOB: 04/07/1966   Cancelled Treatment:    Reason Eval/Treat Not Completed: Other (comment) Nsg requesting to hold at this time due to medical status and possible LP. Will follow up later time.   Dorothy Alvarez PT, DPT Acute Rehabilitation Services Pager [phone] Office [phone]    Dorothy Alvarez 08/01/2021, 10:58 AM

## 2021-08-01 NOTE — Progress Notes (Signed)
K+ 3.6, Mg 1.9 Replaced per protocol

## 2021-08-01 NOTE — Procedures (Signed)
[name]  MRN: [id number]  Epilepsy Attending: Lora Havens  Referring Physician/Provider: Kathie Dike, MD Date: 07/31/2021 Duration: 23.07 mins  Patient history: 55 year old female with altered mental status.  EEG to evaluate for seizure.  Level of alertness: Awake  AEDs during EEG study: None  Technical aspects: This EEG study was done with scalp electrodes positioned according to the 10-20 International system of electrode placement. Electrical activity was acquired at a sampling rate of 500Hz  and reviewed with a high frequency filter of 70Hz  and a low frequency filter of 1Hz . EEG data were recorded continuously and digitally stored.   Description: EEG showed continuous generalized 3 to 6 Hz theta-delta slowing. Hyperventilation and photic stimulation were not performed.     ABNORMALITY - Continuous slow, generalized  IMPRESSION: This study is suggestive of moderate diffuse encephalopathy, nonspecific etiology. No seizures or epileptiform discharges were seen throughout the recording.  Issaac Shipper Barbra Sarks

## 2021-08-01 NOTE — Procedures (Signed)
Lumbar Puncture Procedure Note  Dorothy Alvarez  [id number]  1966/07/19  Date:08/01/21  Time:12:14 PM   Provider Performing:Idil Maslanka   Procedure: Lumbar Puncture (33295)  Indication(s) Rule out meningitis  Consent Risks of the procedure as well as the alternatives and risks of each were explained to the patient and/or caregiver.  Consent for the procedure was obtained and is signed in the bedside chart  Anesthesia Topical only with 1% lidocaine    Time Out Verified patient identification, verified procedure, site/side was marked, verified correct patient position, special equipment/implants available, medications/allergies/relevant history reviewed, required imaging and test results available.   Sterile Technique Maximal sterile technique including sterile barrier drape, hand hygiene, sterile gown, sterile gloves, mask, hair covering.    Procedure Description Using palpation, approximate location of L3-L4 space identified.   Lidocaine used to anesthetize skin and subcutaneous tissue overlying this area.  A 20g spinal needle was then used to access the subarachnoid space. Opening pressure: 27cm H2O. Closing pressure:Not obtained. 15cc clear CSF obtained.  Complications/Tolerance None; patient tolerated the procedure well.   EBL Minimal   Specimen(s) CSF

## 2021-08-02 ENCOUNTER — Inpatient Hospital Stay (HOSPITAL_COMMUNITY): Payer: Medicare HMO

## 2021-08-02 LAB — CBC
HCT: 43.4 % (ref 36.0–46.0)
Hemoglobin: 14.9 g/dL (ref 12.0–15.0)
MCH: 32.1 pg (ref 26.0–34.0)
MCHC: 34.3 g/dL (ref 30.0–36.0)
MCV: 93.5 fL (ref 80.0–100.0)
Platelets: 223 10*3/uL (ref 150–400)
RBC: 4.64 MIL/uL (ref 3.87–5.11)
RDW: 13.6 % (ref 11.5–15.5)
WBC: 21.5 10*3/uL — ABNORMAL HIGH (ref 4.0–10.5)
nRBC: 0 % (ref 0.0–0.2)

## 2021-08-02 LAB — COMPREHENSIVE METABOLIC PANEL
ALT: 127 U/L — ABNORMAL HIGH (ref 0–44)
AST: 125 U/L — ABNORMAL HIGH (ref 15–41)
Albumin: 3.9 g/dL (ref 3.5–5.0)
Alkaline Phosphatase: 109 U/L (ref 38–126)
Anion gap: 17 — ABNORMAL HIGH (ref 5–15)
BUN: 15 mg/dL (ref 6–20)
CO2: 24 mmol/L (ref 22–32)
Calcium: 8.8 mg/dL — ABNORMAL LOW (ref 8.9–10.3)
Chloride: 102 mmol/L (ref 98–111)
Creatinine, Ser: 1.08 mg/dL — ABNORMAL HIGH (ref 0.44–1.00)
GFR, Estimated: >60 mL/min (ref >60)
Glucose, Bld: 123 mg/dL — ABNORMAL HIGH (ref 70–99)
Potassium: 3 mmol/L — ABNORMAL LOW (ref 3.5–5.1)
Sodium: 143 mmol/L (ref 135–145)
Total Bilirubin: 0.8 mg/dL (ref 0.3–1.2)
Total Protein: 7.4 g/dL (ref 6.5–8.1)

## 2021-08-02 LAB — URINE CULTURE: Culture: 10000 — AB

## 2021-08-02 LAB — MAGNESIUM
Magnesium: 2.5 mg/dL — ABNORMAL HIGH (ref 1.7–2.4)
Magnesium: 2.4 mg/dL (ref 1.7–2.4)

## 2021-08-02 LAB — CK: Total CK: 1699 U/L — ABNORMAL HIGH (ref 38–234)

## 2021-08-02 LAB — PHOSPHORUS
Phosphorus: 2.7 mg/dL (ref 2.5–4.6)
Phosphorus: 2.7 mg/dL (ref 2.5–4.6)

## 2021-08-02 LAB — GLUCOSE, CAPILLARY: Glucose-Capillary: 161 mg/dL — ABNORMAL HIGH (ref 70–99)

## 2021-08-02 MED ORDER — PROSOURCE TF PO LIQD
90.0000 mL | Freq: Two times a day (BID) | ORAL | Status: DC
  Administered 2021-08-02 – 2021-08-08 (×13): 90 mL
  Filled 2021-08-02 (×13): qty 90

## 2021-08-02 MED ORDER — POTASSIUM CHLORIDE 10 MEQ/100ML IV SOLN
10.0000 meq | INTRAVENOUS | Status: AC
  Administered 2021-08-02 (×6): 10 meq via INTRAVENOUS
  Filled 2021-08-02 (×6): qty 100

## 2021-08-02 MED ORDER — ATORVASTATIN CALCIUM 80 MG PO TABS
80.0000 mg | ORAL_TABLET | Freq: Every day | ORAL | Status: DC
  Administered 2021-08-02 – 2021-08-03 (×2): 80 mg via ORAL
  Filled 2021-08-02 (×3): qty 1

## 2021-08-02 MED ORDER — MAGNESIUM SULFATE 2 GM/50ML IV SOLN
2.0000 g | Freq: Once | INTRAVENOUS | Status: AC
  Administered 2021-08-02: 2 g via INTRAVENOUS
  Filled 2021-08-02: qty 50

## 2021-08-02 MED ORDER — AMLODIPINE BESYLATE 10 MG PO TABS
10.0000 mg | ORAL_TABLET | Freq: Every day | ORAL | Status: DC
  Administered 2021-08-02 – 2021-08-07 (×6): 10 mg
  Filled 2021-08-02 (×6): qty 1

## 2021-08-02 MED ORDER — OSMOLITE 1.5 CAL PO LIQD
1000.0000 mL | ORAL | Status: DC
  Administered 2021-08-02 – 2021-08-05 (×4): 1000 mL
  Filled 2021-08-02 (×2): qty 1000

## 2021-08-02 NOTE — Progress Notes (Signed)
NAME:  Dorothy Alvarez, MRN:  [id number], DOB:  25-Dec-1965, LOS: 3 ADMISSION DATE:  07/30/2021, CONSULTATION DATE:  12/14  REFERRING MD:  Dr. Roderic Palau, CHIEF COMPLAINT:  Lethargy   History of Present Illness:  55 y/o F who presented to [facility] ER on 12/13 with reports of lethargy/somnolence by family.   Her husband, [name], reports the patient was largely last "normal" in September of 2022.  She had a reported medication change after September from Hydrocodone to Oxycodone.  However, there are no records of this change in Care Everywhere or PDMP.  He does not know the name of her her primary care MD.  He states over the past few months if she was up moving around she was ok but as soon as she sat down went to sleep.  He has noted that she will get up in the middle of the night to take pain medication.  He reports she has been sleeping more in the last two weeks.  The day prior to admit, she was found on the toilet and had been sitting there for an unknown period of time.  Am of 12/13, they had a difficult time getting her up prompting ER evaluation.   Initial ER evaluation notable for altered mental status & hypertensive (initial pressure 187/135).  Lab work up showed leukocytosis with concern for SIRS, AKI, rhabdomyolysis (CK 8,816 > 5,023), lactic acidosis (3.6 to 5.3) and elevated PCT 1.24.  ABG within normal limits. UDS negative.  COVID and Influenza testing negative.  Initial imaging included a CT Renal Stone Study showed emphysematous cystitis and 4.7 cm of rounded soft tissue structure between the gastric fundus and left upper pole of the kidney, negative for renal calculi / obstructing stone.  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  Follow up MRI of the brain showed a diffusion defect concerning for acute left PCA territory infarct.  CXR negative.  ECHO showed an LVEF of ~ 20%, LV with global  hypokinesis, RV systolic function moderately reduced with RVSP ~65, LA mildly dilated, moderate to severe TVR. The patient was treated with gentle IVF's, empiric antibiotics (initially cefepime/vanco then transitioned to ceftriaxone) and IV labetalol for blood pressure control.    PCCM consulted for evaluation of rising lactic acid, concern for possible developing sepsis. At time of my exam, [facility] arrived to transport patient to [facility].   Pertinent  Medical History  HTN Chronic Combined Systolic & Diastolic CHF  Bipolar Disorder  Anxiety / Depression  IBS  Learning Disability   Significant Hospital Events: Including procedures, antibiotic start and stop dates in addition to other pertinent events   12/13 Admit with lethargy/somnolence.  UTI, SIRS, new ischemic stroke, AKI, hypertensive 12/15 no sz on EEG. LP  12/16 Followed simple commands intermittently (opened eyes, mouth). Weaning cleviprex   Interim History / Subjective:   K replaced  Cr improving  LFTs increasing, just mild elevation to 120s  WBC 21.5   LP 12/15 --  Glu 100 RBC 635 WBC 2 Protein 63 no orgs seen   Objective   Blood pressure (!) 163/106, pulse 92, temperature 98.1 F (36.7 C), temperature source Axillary, resp. rate (!) 23, height 5\' 6"  (1.676 m), weight 102 kg, SpO2 98 %.        Intake/Output Summary (Last 24 hours) at 08/02/2021 0832 Last data filed at 08/02/2021 0800 Gross per 24 hour  Intake 3649.99 ml  Output 1440 ml  Net 2209.99 ml   Filed Weights   07/31/21 0722  Weight: 102 kg    Examination: General: ill appearing middle aged F reclined in bed NAD   HENT: NCAT pink mm trachea secure. Pink mm  Lungs: CTAb. Even and unlabored. Symmetrical chest expansion  Cardiovascular: rrr s1s2 cap refill brisk 2+ radial pulses  Abdomen: soft ndnt + bowel sounds  Extremities: no acute joint deformity no cyanosis or clubbing. BUE soft mittens  Neuro: Groaning to verbal and tactile stimulation.  Purposeful movements BUE BLE. Grimaces to pain. Intermittently opens eyes and mouth to command. Unable to assess pupils due to short duration of eye opening. Resists assisted eye opening  GU: foley   Resolved Hospital Problem list     Assessment & Plan:   Acute metabolic encephalopathy  Likely multifactorial -- polypharmacy at home + AKI with rhabdo, suspected sepsis due to emphysematous cystitis, possible low flow state.  Ammonia WNL, UDS neg, not hypercarbic on ABG, no evidence of sz on EEG  LP 12/15 -- not convincing for bacterial meningitis  P -delirium precautions -holding home meds -cont abx though think we can likely de-escalate meningitis bundle to enterococcal coverage (emphysematous cystitis, 10k enterococcus on in urine)  -follow CSF   Acute L PCA infarct  -no LVO on MRA. 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  Mild ketones, ? Starvation ketosis from poor intake.  FENa prerenal  P -cont IVF -trend renal indices, UOP -add on follow up  CK   Mild transaminitis P -repeat LFTs 12/18   Hypokalemia P -replaced -will also give 2g mag  -trend BMP   Bipolar depression Chronic Pain  -hold home medications   Possible Abdominal Mass  Incidental finding of 4.7 cm soft tissue mass -will need follow up imaging with IV and oral contrast, hold for now given AKI   Best Practice (right  click and "Reselect all SmartList Selections" daily)  Diet/type: NPO DVT prophylaxis: LMWH GI prophylaxis: N/A Lines: N/A Foley:  N/A Code Status:  full code Last date of multidisciplinary goals of care discussion: 12/15   Labs   CBC: Recent Labs  Lab 07/30/21 1712 07/31/21 0344 08/01/21 0448 08/02/21 0432  WBC 18.1* 22.5* 23.5* 21.5*  NEUTROABS 14.9*  --   --   --   HGB 16.1* 15.0 14.0 14.9  HCT 47.1* 43.8 42.1 43.4  MCV 92.4 92.6 94.2 93.5  PLT 213 204 197 161    Basic Metabolic Panel: Recent Labs  Lab 07/30/21 1712 07/31/21 0344 07/31/21 1840 08/01/21 0448 08/02/21 0432  NA 144 144 145 147* 143  K 3.2* 3.5 3.3* 3.6 3.0*  CL 104 107 110 112* 102  CO2 23 22 21* 21* 24  GLUCOSE 190* 158* 199* 154* 123*  BUN 19 16 17  21* 15  CREATININE 1.73* 1.24* 1.60* 1.74* 1.08*  CALCIUM 9.6 8.4* 8.6* 8.7* 8.8*  MG  --   --   --  1.9  --    GFR: Estimated Creatinine Clearance: 70.2 mL/min (A) (by C-G formula based on SCr of 1.08 mg/dL (H)). Recent Labs  Lab 07/30/21 1712 07/30/21 2137 07/31/21 0112 07/31/21 0344 07/31/21 0901 07/31/21 1303 07/31/21 1840 08/01/21 0448 08/02/21 0432  PROCALCITON  --   --  1.24  --   --   --   --   --   --   WBC 18.1*  --   --  22.5*  --   --   --  23.5* 21.5*  LATICACIDVEN 4.5*   < > 3.7* 3.4* 3.6* 5.3* 4.1*  --   --    < > = values in this interval not displayed.    Liver Function Tests: Recent Labs  Lab 07/30/21 1712 07/31/21 0344 07/31/21 1840 08/02/21 0432  AST 107* 80* 73* 125*  ALT 36 33 43 127*  ALKPHOS 87 73 107 109  BILITOT 1.7* 2.0* 1.5* 0.8  PROT 8.1 7.2 6.6 7.4  ALBUMIN 4.5 4.0 3.7 3.9   No results for input(s): LIPASE, AMYLASE in the last 168 hours. Recent Labs  Lab 07/31/21 1840  AMMONIA 28    ABG    Component Value Date/Time   PHART 7.443 07/31/2021 1350   PCO2ART 31.9 (L) 07/31/2021 1350   PO2ART 68.1 (L) 07/31/2021 1350   HCO3 21.5 07/31/2021 1350   TCO2 33 (H) 05/04/2020 1005   ACIDBASEDEF  1.3 07/31/2021 1350   O2SAT 92.6 07/31/2021 1350     Coagulation Profile: No results for input(s): INR, PROTIME in the last 168 hours.  Cardiac Enzymes: Recent Labs  Lab 07/30/21 1712 07/31/21 0344  CKTOTAL 8,816* 5,023*    HbA1C: Hgb A1c MFr Bld  Date/Time Value Ref Range Status  07/31/2021 03:44 AM 6.4 (H) 4.8 - 5.6 % Final    Comment:    (NOTE) Pre diabetes:          5.7%-6.4%  Diabetes:              >6.4%  Glycemic control for   <7.0% adults with diabetes   10/21/2018 06:42 AM 5.6 4.8 - 5.6 % Final    Comment:    (NOTE) Pre diabetes:          5.7%-6.4% Diabetes:              >6.4% Glycemic control for   <7.0% adults with diabetes     CBG: Recent Labs  Lab 07/30/21 1654  GLUCAP 189*      CRITICAL CARE Performed by: Cristal Generous   Total critical care time: 38 minutes  Critical care time was exclusive of separately billable procedures and treating other patients. Critical care was necessary to treat or prevent imminent or life-threatening deterioration.  Critical care was time spent personally by me on the following activities: development of treatment plan with patient and/or surrogate as well as nursing, discussions with consultants, evaluation of patient's response to treatment, examination of patient, obtaining history from patient or surrogate, ordering and performing treatments and interventions, ordering and review of laboratory studies, ordering and review of radiographic studies, pulse oximetry and re-evaluation of patient's condition.  Eliseo Gum MSN, AGACNP-BC [place] for pager  08/02/2021, 8:32 AM

## 2021-08-02 NOTE — Progress Notes (Signed)
Initial Nutrition Assessment  DOCUMENTATION CODES:   Obesity unspecified  INTERVENTION:   Initiate tube feeding via Cortrak tube: Osmolite 1.5 at 20 ml/hr increase by 10 ml every 4 hours to goal rate of 50 ml/h (1200 ml per day) Prosource TF 90 ml BID  Provides 1960 kcal, 119 gm protein, 912 ml free water daily  Encourage PO as able   NUTRITION DIAGNOSIS:   Inadequate oral intake related to acute illness as evidenced by meal completion < 25%.  GOAL:   Patient will meet greater than or equal to 90% of their needs  MONITOR:   PO intake, TF tolerance  REASON FOR ASSESSMENT:   Consult Enteral/tube feeding initiation and management  ASSESSMENT:   Pt with PMH of HTN, CHF, bipolar disorder, anxiety, depression, IBS, learning disability admitted 12/13 for lethargy and somnolence, UTI, SIRS, new ischemic stroke, AKI, hypertensive.   Pt discussed during ICU rounds and with RN. Noted incidental finding of 4.7 cm soft tissue mass in abdomen. SLP following, pt able to swallow Regular with thin but has refused.   12/15 no sz on EEG, s/p LP 12/16 s/p cortrak placement  Pt unable to answer any questions. Husband at bedside. He reports no recent weight loss and instead had gained weight. Per husband for the last month and a half pt has fallen asleep during conversations and just PTA was sleeping 24/7. Pt with eyes closed during interview and exam.   Medications reviewed and include:  LR @ 75 ml/hr  Labs reviewed: K 3 CBG: 189 A1C: 6.4  NUTRITION - FOCUSED PHYSICAL EXAM:  Flowsheet Row Most Recent Value  Orbital Region No depletion  Upper Arm Region No depletion  Thoracic and Lumbar Region No depletion  Buccal Region No depletion  Temple Region No depletion  Clavicle Bone Region No depletion  Clavicle and Acromion Bone Region No depletion  Scapular Bone Region No depletion  Dorsal Hand No depletion  Patellar Region Mild depletion  Anterior Thigh Region Mild depletion   Posterior Calf Region Mild depletion  Edema (RD Assessment) None  Hair Reviewed  Eyes Unable to assess  Mouth Unable to assess  Skin Reviewed  Nails Reviewed       Diet Order:   Diet Order             Diet regular Room service appropriate? Yes; Fluid consistency: Thin  Diet effective now                   EDUCATION NEEDS:   Not appropriate for education at this time  Skin:  Skin Assessment: Reviewed RN Assessment  Last BM:  unknown  Height:   Ht Readings from Last 1 Encounters:  07/31/21 5\' 6"  (1.676 m)    Weight:   Wt Readings from Last 1 Encounters:  07/31/21 102 kg    BMI:  Body mass index is 36.29 kg/m.  Estimated Nutritional Needs:   Kcal:  1900-2100  Protein:  100-115 grams  Fluid:  >2 L/day  Lockie Pares., RD, LDN, CNSC See AMiON for contact information

## 2021-08-02 NOTE — Evaluation (Signed)
Clinical/Bedside Swallow Evaluation Patient Details  Name: Dorothy Alvarez MRN: [id number] Date of Birth: Jun 28, 1966  Today's Date: 08/02/2021 Time: SLP Start Time (ACUTE ONLY): 1000 SLP Stop Time (ACUTE ONLY): 1015 SLP Time Calculation (min) (ACUTE ONLY): 15 min  Past Medical History:  Past Medical History:  Diagnosis Date   Allergy    Anxiety    Arthritis    Chronic abdominal pain    Constipation    Depression    Hypertension    IBS (irritable bowel syndrome)    Learning disability    Neuromuscular disorder (HCC)    Past Surgical History:  Past Surgical History:  Procedure Laterality Date   ABDOMINAL HYSTERECTOMY     GALLBLADDER SURGERY     KNEE SURGERY     RIGHT/LEFT HEART CATH AND CORONARY ANGIOGRAPHY N/A 05/04/2020   Procedure: RIGHT/LEFT HEART CATH AND CORONARY ANGIOGRAPHY;  Surgeon: Belva Crome, MD;  Location: [place] [facility];  Service: Cardiovascular;  Laterality: N/A;   HPI:  Pt presented to the ED at [facility] on 12/13 with progressive altered mental status and lethargy.  MRI was performed reveling and acute left PCA territory infarct, possibly embolic, including punctate areas of restricted diffusion in bilateral cerebral and right cerebellar hemisphere as well as medial right thalamus, encephalomalacia in medial right occipital lobe, right posterior hippocampus and left frontoparietal region.  She was then transferred here for further evaluation.  EEG reveled no seizure activity.  Patient has required Cleviprex for BP control    Assessment / Plan / Recommendation  Clinical Impression  Pt demonstrates abilty to swallow liquids if they are placed in her mouth with total assist. She will not actively consume any PO; does not sip from straw, but will drink from cup edge. Accepted puree with total assist feeding. Pt would not allow herself to be fed solids, spit out a cookie. Pt moved all extremities and exhibited purposeful self initaited movement but did not  follow commands other than to open eyes to squint at me. 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 Will initaite a regular diet and thin liquids to entice pt to eat and drink given that she is capable of swallowing. May need supplemental nutriation if participation does not improve. SLP Visit Diagnosis: Dysphagia, unspecified (R13.10)    Aspiration Risk  Risk for inadequate nutrition/hydration    Diet Recommendation Regular;Thin liquid   Liquid Administration via: Cup;Straw Medication Administration: Whole meds with liquid Supervision: Staff to assist with self feeding Compensations: Slow rate;Small sips/bites Postural Changes: Seated upright at 90 degrees    Other  Recommendations Oral Care Recommendations: Oral care BID    Recommendations for follow up therapy are one component of a multi-disciplinary discharge planning process, led by the attending physician.  Recommendations may be updated based on patient status, additional functional criteria and insurance authorization.  Follow up Recommendations Outpatient SLP      Assistance Recommended at Discharge Intermittent Supervision/Assistance  Functional Status Assessment Patient has had a recent decline in their functional status and demonstrates the ability to make significant improvements in function in a reasonable and predictable amount of time.  Frequency and Duration min 2x/week  2 weeks       Prognosis        Swallow Study   General HPI: Pt presented to the ED at [facility] on 12/13 with progressive altered mental status and lethargy.  MRI was performed reveling and acute left PCA territory infarct, possibly  embolic, including punctate areas of restricted diffusion in bilateral cerebral and right cerebellar hemisphere as well as medial right thalamus, encephalomalacia in medial right occipital lobe, right posterior hippocampus and left frontoparietal region.   She was then transferred here for further evaluation.  EEG reveled no seizure activity.  Patient has required Cleviprex for BP control Type of Study: Bedside Swallow Evaluation Diet Prior to this Study: NPO Temperature Spikes Noted: No Respiratory Status: Room air History of Recent Intubation: No Behavior/Cognition: Requires cueing;Doesn't follow directions Oral Cavity Assessment: Within Functional Limits Oral Care Completed by SLP: No Oral Cavity - Dentition: Adequate natural dentition Self-Feeding Abilities: Total assist Patient Positioning: Upright in bed Baseline Vocal Quality: Not observed Volitional Cough: Cognitively unable to elicit Volitional Swallow: Unable to elicit    Oral/Motor/Sensory Function Overall Oral Motor/Sensory Function: Other (comment) (does not follow commands, but movement appears WNL)   Ice Chips Ice chips: Not tested   Thin Liquid Thin Liquid: Impaired Presentation: Straw;Cup Oral Phase Impairments: Other (comment) (Pt will not initiate sipping from straw)    Nectar Thick Nectar Thick Liquid: Not tested   Honey Thick Honey Thick Liquid: Not tested   Puree Puree: Impaired Presentation: Spoon Oral Phase Impairments: Other (comment) (Pt will not initaite opening mouth, but will be fed)   Solid     Solid: Impaired Oral Phase Impairments: Other (comment) (Pt spit out cracker placed in mouth)      Antwion Carpenter, Katherene Ponto 08/02/2021,11:50 AM

## 2021-08-02 NOTE — Procedures (Addendum)
Cortrak  Person Inserting Tube:  Saw Mendenhall, Creola Corn, RD Tube Type:  Cortrak - 43 inches Tube Size:  10 Tube Location:  Right nare Initial Placement:  Stomach Secured by: Bridle Technique Used to Measure Tube Placement:  Marking at nare/corner of mouth Cortrak Secured At:  65 cm  Cortrak Tube Team Note:  Consult received to place a Cortrak feeding tube.   X-ray is required, abdominal x-ray has been ordered by the Cortrak team. Please confirm tube placement before using the Cortrak tube.   If the tube becomes dislodged please keep the tube and contact the Cortrak team at [email or web address] ([email or web address]) for replacement.  If after hours and replacement cannot be delayed, place a NG tube and confirm placement with an abdominal x-ray.     Theone Stanley., MS, RD, LDN (she/her/hers) RD pager number and weekend/on-call pager number located in [place].

## 2021-08-02 NOTE — Evaluation (Signed)
Physical Therapy Evaluation Patient Details Name: Dorothy Alvarez MRN: [id number] DOB: [birth year] Today's Date: 08/02/2021  History of Present Illness  55 y/o female presented to [facility] ED on 12/13 for lethargy and confusion x 2 weeks. Found to have AKI, rhabdo, and emphysematous cystitis. MRI showed acute L PCA infarct, additional punctate areas of restricted diffusion in bilateral cerebral, R cerebellar, and medial R thalamus, and encephalomalacia in medial R occipital lobe, R posterior hippocampus, and L frontoparietal region. PMH: HTN, chronic pain syndrome, bipolar disorder, anxiety  Clinical Impression  Patient admitted with above diagnosis. Patient presents with generalized weakness, impaired cognition, and impaired balance based on evaluation. Patient not following commands but spontaneously moving bilateral LE. TotalA+2 for bed mobility but able to sit unsupported without sway or lateral lean with supervision. Patient resistive to eye opening attempts but one instance of eye opening when told "your daughter is here". Patient's daughter stated patient's name and patient echoed back "Dorothy Alvarez" x 3. Attempted to stand but totalA+2. Patient will benefit from skilled PT services during acute stay to address listed deficits. Recommend SNF at this time to maximize functional mobility.        Recommendations for follow up therapy are one component of a multi-disciplinary discharge planning process, led by the attending physician.  Recommendations may be updated based on patient status, additional functional criteria and insurance authorization.  Follow Up Recommendations Skilled nursing-short term rehab (<3 hours/day)    Assistance Recommended at Discharge Frequent or constant Supervision/Assistance  Functional Status Assessment Patient has had a recent decline in their functional status and/or demonstrates limited ability to make significant improvements in function in a reasonable and predictable amount of  time  Equipment Recommendations  Other (comment) (TBD)    Recommendations for Other Services       Precautions / Restrictions Precautions Precautions: Fall Precaution Comments: cortrak, foley Restrictions Weight Bearing Restrictions: No      Mobility  Bed Mobility Overal bed mobility: Needs Assistance Bed Mobility: Supine to Sit;Sit to Supine     Supine to sit: Total assist;+2 for physical assistance Sit to supine: Total assist;+2 for physical assistance        Transfers Overall transfer level: Needs assistance                 General transfer comment: Attempted stand - pt total A +2    Ambulation/Gait                  Stairs            Wheelchair Mobility    Modified Rankin (Stroke Patients Only)       Balance Overall balance assessment: Needs assistance   Sitting balance-Leahy Scale: Fair Sitting balance - Comments: able to sit unsupported EOB                                     Pertinent Vitals/Pain Pain Assessment: Faces Faces Pain Scale: Hurts a little bit (to noxious stimuli) Facial Expression: Relaxed, neutral Body Movements: Absence of movements Muscle Tension: Relaxed Compliance with ventilator (intubated pts.): N/A Vocalization (extubated pts.): Talking in normal tone or no sound CPOT Total: 0 Pain Descriptors / Indicators: Grimacing Pain Intervention(s): Limited activity within patient's tolerance    Home Living Family/patient expects to be discharged to:: Private residence Living Arrangements: Spouse/significant other;Children Available Help at Discharge: Available 24 hours/day (try to be) Type of Home: House Home Access:  Stairs to enter Entrance Stairs-Rails: Psychiatric nurse of Steps: 5   Home Layout: One level Home Equipment: None      Prior Function Prior Level of Function : Independent/Modified Independent;Driving (likes to Shop; Youth worker)                      Hand Dominance   Dominant Hand: Right    Extremity/Trunk Assessment   Upper Extremity Assessment Upper Extremity Assessment: Defer to OT evaluation    Lower Extremity Assessment Lower Extremity Assessment: Difficult to assess due to impaired cognition (Spontaneous movement of bilateral LEs but not on command)    Cervical / Trunk Assessment Cervical / Trunk Assessment: Normal  Communication      Cognition Arousal/Alertness: Lethargic Behavior During Therapy: Flat affect Overall Cognitive Status: Difficult to assess                                 General Comments: patient resistive to eye opening. Not following commands but moving spontaneously. Patient with one instance of eye opening when told "your daughter is here" but quickly closed eyes after.        General Comments General comments (skin integrity, edema, etc.): daughter and husband present; at one point daughter called her Mother's name and pt echoed name back several times    Exercises     Assessment/Plan    PT Assessment Patient needs continued PT services  PT Problem List Decreased strength;Decreased activity tolerance;Decreased mobility;Decreased balance;Decreased coordination;Decreased cognition;Decreased knowledge of use of DME;Decreased safety awareness;Decreased knowledge of precautions;Cardiopulmonary status limiting activity       PT Treatment Interventions DME instruction;Gait training;Functional mobility training;Therapeutic activities;Therapeutic exercise;Balance training;Patient/family education    PT Goals (Current goals can be found in the Care Plan section)  Acute Rehab PT Goals Patient Stated Goal: did not state PT Goal Formulation: With family Time For Goal Achievement: 08/16/21 Potential to Achieve Goals: Fair    Frequency Min 3X/week   Barriers to discharge        Co-evaluation PT/OT/SLP Co-Evaluation/Treatment: Yes Reason for Co-Treatment: Necessary to address  cognition/behavior during functional activity;For patient/therapist safety PT goals addressed during session: Mobility/safety with mobility         AM-PAC PT "6 Clicks" Mobility  Outcome Measure Help needed turning from your back to your side while in a flat bed without using bedrails?: Total Help needed moving from lying on your back to sitting on the side of a flat bed without using bedrails?: Total Help needed moving to and from a bed to a chair (including a wheelchair)?: Total Help needed standing up from a chair using your arms (e.g., wheelchair or bedside chair)?: Total Help needed to walk in hospital room?: Total Help needed climbing 3-5 steps with a railing? : Total 6 Click Score: 6    End of Session Equipment Utilized During Treatment: Oxygen Activity Tolerance: Patient tolerated treatment well Patient left: in bed;with call bell/phone within reach;with bed alarm set;with family/visitor present Nurse Communication: Mobility status PT Visit Diagnosis: Muscle weakness (generalized) (M62.81);Unsteadiness on feet (R26.81);Other abnormalities of gait and mobility (R26.89);Other symptoms and signs involving the nervous system (G62.694)    Time: 8546-2703 PT Time Calculation (min) (ACUTE ONLY): 30 min   Charges:   PT Evaluation $PT Eval Moderate Complexity: 1 Mod          Arkin Imran A. Gilford Rile, PT, DPT Acute Rehabilitation Services Pager [phone] Office [phone]  Kolette Vey A Laquashia Mergenthaler 08/02/2021, 4:57 PM

## 2021-08-02 NOTE — Progress Notes (Addendum)
STROKE TEAM PROGRESS NOTE   INTERVAL HISTORY Patient is seen in her room with no family at bedside.  LP performed yesterday showed no significant finding.  Patient will have core track placed today for p.o. access if she is unsuccessful with speech therapy.  Patient will not follow commands or open her eyes to command, but she is seen spontaneously looking around the room and moving all extremities.  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   Vitals:   08/02/21 1200 08/02/21 1300 08/02/21 1400 08/02/21 1500  BP: (S) (!) 201/128 (!) 175/123 (!) 177/141 (!) 183/140  Pulse: 92 85 94 75  Resp: (!) 23 (!) 29 20 (!) 21  Temp: 98.7 F (37.1 C)     TempSrc: Axillary     SpO2: 96% 96% 93% 97%  Weight:      Height:       CBC:  Recent Labs  Lab 07/30/21 1712 07/31/21 0344 08/01/21 0448 08/02/21 0432  WBC 18.1*   < > 23.5* 21.5*  NEUTROABS 14.9*  --   --   --   HGB 16.1*   < > 14.0 14.9  HCT 47.1*   < > 42.1 43.4  MCV 92.4   < > 94.2 93.5  PLT 213   < > 197 223   < > = values in this interval not displayed.    Basic Metabolic Panel:  Recent Labs  Lab 08/01/21 0448 08/02/21 0432  NA 147* 143  K 3.6 3.0*  CL 112* 102  CO2 21* 24  GLUCOSE 154* 123*  BUN 21* 15  CREATININE 1.74* 1.08*  CALCIUM 8.7* 8.8*  MG 1.9  --     Lipid Panel:  Recent Labs  Lab 07/31/21 0344  CHOL 211*  TRIG 118  HDL 46  CHOLHDL 4.6  VLDL 24  LDLCALC 141*    HgbA1c:  Recent Labs  Lab 07/31/21 0344  HGBA1C 6.4*    Urine Drug Screen:  Recent Labs  Lab 07/30/21 2046  LABOPIA NONE DETECTED  COCAINSCRNUR NONE DETECTED  LABBENZ NONE DETECTED  AMPHETMU NONE DETECTED  THCU NONE DETECTED  LABBARB NONE DETECTED     Alcohol Level  Recent Labs  Lab 07/30/21 1712  ETH <10     IMAGING past 24 hours DG Abd Portable 1V  Result Date: 08/02/2021 CLINICAL DATA:  Feeding tube placement EXAM: PORTABLE ABDOMEN - 1 VIEW COMPARISON:  None.  FINDINGS: Feeding tube with weighted tip in the gastric antrum/pyloric region. Stylet removed. Normal bowel-gas pattern. IMPRESSION: Feeding tube with tip in the gastric antral/pyloric region. Electronically Signed   By: Suzy Bouchard M.D.   On: 08/02/2021 15:14   [place] EKG SITE RITE  Result Date: 08/01/2021 If Site Rite image not attached, placement could not be confirmed due to current cardiac rhythm.   PHYSICAL EXAM General:  Patient is a well-developed, well-nourished female in no acute distress  Neuro:  Patient will moan in response to name but will not answer questions or follow commands.  Her eyes are tightly closed and she resists opening her eyes by examiner.  She does slightly open them occasionally.  She will move all four extremities spontaneously and purposefully.  ASSESSMENT/PLAN [name] is a 55 y.o. female with history of anxiety, depression, HTN and chronic pain presenting with lethargy and altered mental status.  She presented to the ED at [facility] on 12/13 with progressive altered mental status and lethargy.  MRI was performed  reveling and acute left PCA territory infarct, possibly embolic.  She was then transferred here for further evaluation.  EEG reveled no seizure activity.  Patient has required Cleviprex for BP control and was febrile yesterday to 101.1.  Plan is to obtain LP to rule out infectious causes of her altered mental status.  TEE will be performed to rule out endocarditis.  Stroke: Multifocal infarcts including left PCA, right cerebellum, bilateral frontal and right thalamus likely embolic from endocarditis vs. cardiomyopathy with low EF CT head Chronic right occipital lobe infarct and degenerative changes in cervical spine. 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 Will recommend DOAC due to low EF if TEE negative for endocarditis. Once EF > 30%, can switch DOAC back to antiplatelet.  Therapy recommendations:  SNF Disposition:  pending  CHF 04/2021 EF 25 to 30%.  Per discharge, patient discharged on Lasix 80, Entresto 49/51 twice daily, Coreg 3.125 twice daily, hydralazine 75 3 times daily and isosorbide 15 twice daily.  However, dose medication was not on patient medication list on admission, concerning for medication noncompliance. Patient also did not follow-up with cardiology, concerning for noncompliance This admission, EF 20% TEE pending 12/20 Will recommend DOAC due to low EF if TEE negative for endocarditis. Once EF > 30%, can switch DOAC back to antiplatelet.   Encephalopathy vs. Functional status Altered mental status for 1 week prior to admission Still not follow commands, nonverbal at this time Observed spontaneous open eyes, looking around, watching RN hanging IV. However, not open eyes on request and actively resisting forced eye opening  Not following commands but able to sit at EOB independently Able to swallow liquid but spitting out solids EEG moderate diffuse encephalopathy, no seizure LP performed and CSF-not consistent with CNS infection De-escalate antibiotics per primary team  UTI SIRS Afebrile for the last 48 hours Tachycardia resolved Leukocytosis WBC 18.1-22.5-23.5- > 21.5 Blood culture so far negative Urine culture ENTEROCOCCUS FAECALIS UA WBC 6-10 CSF no CNS infection TEE to rule out endocarditis -planned for 12/20 Antibiotics de-escalated to  ampicillin  Hypertension Home meds:  amlodipine-benazepril 10-20 On the high end Off Cleviprex Amlodipine 10 mg restarted Keep BP <180/105, gradually normalize BP Long-term BP goal normotensive   Hyperlipidemia Home meds: none LDL 141, goal < 70 Add atorvastatin 80 mg High intensity statin to be initiated Continue statin at discharge  Abdominal soft tissue mass CT abdomen showed 4.7 cm rounded soft tissue structure interposed between the gastric fundus and the upper pole left kidney. Given location, gastric diverticulum, pancreatic tail mass, or exophytic renal mass could give this appearance. Nonemergent follow-up CT of the abdomen with IV and oral contrast is recommended. CCM on board  Other Stroke Risk Factors Obesity, Body mass index is 36.29 kg/m., BMI >/= 30 associated with increased stroke risk, recommend weight loss, diet and exercise as appropriate   Other Active Problems Chronic pain syndrome Anxiety   Hospital day # 3  Patient seen and examined by NP/APP with MD. MD to update note as needed.  Dorothy Ores, DNP, FNP-BC [facility] Pager: [phone]  ATTENDING NOTE: I reviewed above note and agree with the assessment and plan. Pt was seen and examined.   No family bedside.  Patient lying in bed, initially eyes open looking around watching RN hanging up IV bags.  However on my stepping into the room, she closed her eyes and actively resisted forced eye opening.  With repeated request, she was able to half way open eyes briefly. Non verbal and not following commands. However, no facial asymmetry, moving all extremities spontaneously but not per request. On pain stimulation, pt screaming for pain. Per OT, pt not following commands but able to sit at EOB independently. Per speech, pt able to swallow liquid but spitting out solids. Altogether, there is concern of functional status on top of embolic strokes and encephalopathy.   For embolic strokes,  etiology likely due to cardiomyopathy with low EF.  Agree with TEE to rule out endocarditis.  If it is ruled out, may consider DOAC for cardiomyopathy.  Once EF > 30%, may switch back to antiplatelet.  Patient does have UTI, sensitive to ampicillin, will continue.  Afebrile today, leukocytosis slightly improved, CSF no CNS infection, will de-escalate on antibiotics.  BP on the higher end, amlodipine restarted, BP goal less than 180/105, close monitoring.  Continue statin and aspirin for now.  Although patient has a swallow, however not adequate p.o. intake, will place core track for tube feeding and medication.  We will follow  For detailed assessment and plan, please refer to above as I have made changes wherever appropriate.   Rosalin Hawking, MD PhD Stroke Neurology 08/02/2021 4:01 PM  This patient is critically ill due to stroke, fever, encephalopathy, UTI, cardiomyopathy and at significant risk of neurological worsening, death form recurrent stroke, hemorrhagic conversion, sepsis, heart failure. This patient's care requires constant monitoring of vital signs, hemodynamics, respiratory and cardiac monitoring, review of multiple databases, neurological assessment, discussion with family, other specialists and medical decision making of high complexity. I spent 35 minutes of neurocritical care time in the care of this patient.  I discussed with CCM NP and speech therapist and occupational therapist     To contact Stroke Continuity provider, please refer to [email or web address] After hours, contact General Neurology

## 2021-08-02 NOTE — Progress Notes (Signed)
SLP Cancellation Note  Patient Details Name: Dorothy Alvarez MRN: [id number] DOB: Dec 25, 1965   Cancelled treatment:       Reason Eval/Treat Not Completed: Patient's level of consciousness   Tearsa Kowalewski, Katherene Ponto 08/02/2021, 9:39 AM

## 2021-08-02 NOTE — Progress Notes (Signed)
K+ 3.0 °Replaced per protocol ° °

## 2021-08-02 NOTE — Evaluation (Signed)
Occupational Therapy Evaluation Patient Details Name: Dorothy Alvarez MRN: [id number] DOB: 1965/12/27 Today's Date: 08/02/2021   History of Present Illness 55 y/o female presented to [facility] ED on 12/13 for lethargy and confusion x 2 weeks. Found to have AKI, rhabdo, and emphysematous cystitis. MRI showed acute L PCA infarct, additional punctate areas of restricted diffusion in bilateral cerebral, R cerebellar, and medial R thalamus, and encephalomalacia in medial R occipital lobe, R posterior hippocampus, and L frontoparietal region. PMH: HTN, chronic pain syndrome, bipolar disorder, anxiety   Clinical Impression   PTA pt lives independently with her husband. Does not work, but drives and enjoys shopping. Daughter and husband present during session. No command follow with the exception of opening hand x 1 on command. Total A +2 to mobilize to EOB, however once EOB could sit unsupported without any noted postural sway or lateral lean. Attempted stand with Total A +2. VSS throughout. Moving BUE spontaneously but not to command. During ROM, pt would let arms fall back to bed however she would not allow them to fall onto her face. Pt repeated her name to her daughter x 3 - no other spontaneous speech. At this time recommend rehab at [facility].     Recommendations for follow up therapy are one component of a multi-disciplinary discharge planning process, led by the attending physician.  Recommendations may be updated based on patient status, additional functional criteria and insurance authorization.   Follow Up Recommendations  Skilled nursing-short term rehab (<3 hours/day)    Assistance Recommended at Discharge Frequent or constant Supervision/Assistance  Functional Status Assessment  Patient has had a recent decline in their functional status and/or demonstrates limited ability to make significant improvements in function in a reasonable and predictable amount of time  Equipment Recommendations   BSC/3in1;Wheelchair (measurements OT);Wheelchair cushion (measurements OT);Hospital bed    Recommendations for Other Services       Precautions / Restrictions Precautions Precautions: Fall Precaution Comments: cortrak, foley Restrictions Weight Bearing Restrictions: No      Mobility Bed Mobility Overal bed mobility: Needs Assistance Bed Mobility: Supine to Sit;Sit to Supine     Supine to sit: Total assist;+2 for physical assistance Sit to supine: Total assist;+2 for physical assistance        Transfers Overall transfer level: Needs assistance                 General transfer comment: Attempted stand - pt total A +2      Balance Overall balance assessment: Needs assistance   Sitting balance-Leahy Scale: Fair Sitting balance - Comments: able to sit unsupported EOB                                   ADL either performed or assessed with clinical judgement   ADL                                         General ADL Comments: total A wtih all ADL tasks at this time     Vision Baseline Vision/History: 0 No visual deficits Additional Comments: kept eyes closed 99% of session; resistive to passive eyelid opening     Perception Perception Comments: will further assess   Praxis      Pertinent Vitals/Pain Pain Assessment: Faces Faces Pain Scale: Hurts a little bit (to noxious stimuli) Facial  Expression: Relaxed, neutral Body Movements: Absence of movements Muscle Tension: Relaxed Compliance with ventilator (intubated pts.): N/A Vocalization (extubated pts.): Talking in normal tone or no sound CPOT Total: 0 Pain Descriptors / Indicators: Grimacing Pain Intervention(s): Limited activity within patient's tolerance     Hand Dominance Right   Extremity/Trunk Assessment Upper Extremity Assessment Upper Extremity Assessment: Difficult to assess due to impaired cognition (moving BUE spontaneously but not to command; would hold  UE up in air and not let it droponto her head however would let her arm somewhat passively drop onto the bed)   Lower Extremity Assessment Lower Extremity Assessment: Defer to PT evaluation   Cervical / Trunk Assessment Cervical / Trunk Assessment: Normal   Communication     Cognition Arousal/Alertness: Lethargic Behavior During Therapy: Flat affect Overall Cognitive Status: Difficult to assess                                 General Comments: patient resistive to eye opening. Not following commands but moving spontaneously. Patient with one instance of eye opening when told "your daughter is here" but quickly closed eyes after.     General Comments  daughter and husband present; at one point daughter called her Mother's name and pt echoed name back severa times    Exercises     Shoulder Instructions      Home Living Family/patient expects to be discharged to:: Private residence Living Arrangements: Spouse/significant other;Children Available Help at Discharge: Available 24 hours/day (try to be) Type of Home: House Home Access: Stairs to enter [employer] of Steps: 5 Entrance Stairs-Rails: Right;Left Home Layout: One level     Bathroom Shower/Tub: Tub/shower unit;Walk-in shower;Curtain   Bathroom Toilet: Handicapped height Bathroom Accessibility: Yes How Accessible: Accessible via walker;Accessible via wheelchair Home Equipment: None          Prior Functioning/Environment Prior Level of Function : Independent/Modified Independent;Driving (likes to Shop; Youth worker)                        OT Problem List: Decreased strength;Decreased activity tolerance;Impaired balance (sitting and/or standing);Impaired vision/perception;Decreased coordination;Decreased cognition;Decreased safety awareness;Decreased knowledge of use of DME or AE;Obesity      OT Treatment/Interventions: Self-care/ADL training;Therapeutic exercise;Neuromuscular  education;DME and/or AE instruction;Therapeutic activities;Cognitive remediation/compensation;Visual/perceptual remediation/compensation;Patient/family education;Balance training    OT Goals(Current goals can be found in the care plan section) Acute Rehab OT Goals Patient Stated Goal: per family for [place] to get better OT Goal Formulation: With family Time For Goal Achievement: 08/16/21 Potential to Achieve Goals: Fair  OT Frequency: Min 2X/week   Barriers to D/C:            Co-evaluation   Reason for Co-Treatment: Necessary to address cognition/behavior during functional activity;For patient/therapist safety PT goals addressed during session: Mobility/safety with mobility        AM-PAC OT "6 Clicks" Daily Activity     Outcome Measure Help from another person eating meals?: Total Help from another person taking care of personal grooming?: Total Help from another person toileting, which includes using toliet, bedpan, or urinal?: Total Help from another person bathing (including washing, rinsing, drying)?: Total Help from another person to put on and taking off regular upper body clothing?: Total Help from another person to put on and taking off regular lower body clothing?: Total 6 Click Score: 6   End of Session Equipment Utilized During Treatment: Gait belt Nurse  Communication: Mobility status  Activity Tolerance: Patient limited by lethargy Patient left: in bed;with call bell/phone within reach;with bed alarm set;with family/visitor present (modified chair position)  OT Visit Diagnosis: Other abnormalities of gait and mobility (R26.89);Muscle weakness (generalized) (M62.81);Low vision, both eyes (H54.2);Other symptoms and signs involving cognitive function;Other symptoms and signs involving the nervous system (R29.898)                Time: 3968-8648 OT Time Calculation (min): 32 min Charges:  OT General Charges $OT Visit: 1 Visit OT Evaluation $OT Eval Moderate  Complexity: [place], OT/L   Acute OT Clinical Specialist [place] Pager [phone] Office [phone]   [name] 08/02/2021, 4:09 PM

## 2021-08-03 ENCOUNTER — Inpatient Hospital Stay (HOSPITAL_COMMUNITY): Payer: Medicare HMO

## 2021-08-03 DIAGNOSIS — I639 Cerebral infarction, unspecified: Secondary | ICD-10-CM

## 2021-08-03 LAB — GLUCOSE, CAPILLARY
Glucose-Capillary: 148 mg/dL — ABNORMAL HIGH (ref 70–99)
Glucose-Capillary: 150 mg/dL — ABNORMAL HIGH (ref 70–99)
Glucose-Capillary: 172 mg/dL — ABNORMAL HIGH (ref 70–99)
Glucose-Capillary: 180 mg/dL — ABNORMAL HIGH (ref 70–99)
Glucose-Capillary: 180 mg/dL — ABNORMAL HIGH (ref 70–99)
Glucose-Capillary: 192 mg/dL — ABNORMAL HIGH (ref 70–99)
Glucose-Capillary: 209 mg/dL — ABNORMAL HIGH (ref 70–99)

## 2021-08-03 LAB — BASIC METABOLIC PANEL
Anion gap: 9 (ref 5–15)
BUN: 19 mg/dL (ref 6–20)
CO2: 29 mmol/L (ref 22–32)
Calcium: 8.2 mg/dL — ABNORMAL LOW (ref 8.9–10.3)
Chloride: 101 mmol/L (ref 98–111)
Creatinine, Ser: 0.89 mg/dL (ref 0.44–1.00)
GFR, Estimated: >60 mL/min (ref >60)
Glucose, Bld: 173 mg/dL — ABNORMAL HIGH (ref 70–99)
Potassium: 3.2 mmol/L — ABNORMAL LOW (ref 3.5–5.1)
Sodium: 139 mmol/L (ref 135–145)

## 2021-08-03 LAB — PHOSPHORUS
Phosphorus: 2.1 mg/dL — ABNORMAL LOW (ref 2.5–4.6)
Phosphorus: 3.1 mg/dL (ref 2.5–4.6)

## 2021-08-03 LAB — MAGNESIUM
Magnesium: 2.3 mg/dL (ref 1.7–2.4)
Magnesium: 2.2 mg/dL (ref 1.7–2.4)

## 2021-08-03 MED ORDER — AMANTADINE HCL 100 MG PO CAPS
100.0000 mg | ORAL_CAPSULE | Freq: Two times a day (BID) | ORAL | Status: DC
  Filled 2021-08-03 (×2): qty 1

## 2021-08-03 MED ORDER — AMANTADINE HCL 50 MG/5ML PO SOLN
100.0000 mg | Freq: Two times a day (BID) | ORAL | Status: DC
  Administered 2021-08-03: 100 mg
  Filled 2021-08-03: qty 10

## 2021-08-03 MED ORDER — AMANTADINE HCL 50 MG/5ML PO SOLN
100.0000 mg | Freq: Two times a day (BID) | ORAL | Status: DC
  Administered 2021-08-03 – 2021-08-05 (×5): 100 mg
  Filled 2021-08-03 (×6): qty 10

## 2021-08-03 MED ORDER — POTASSIUM PHOSPHATES 15 MMOLE/5ML IV SOLN
30.0000 mmol | Freq: Once | INTRAVENOUS | Status: AC
  Administered 2021-08-03: 30 mmol via INTRAVENOUS
  Filled 2021-08-03: qty 10

## 2021-08-03 NOTE — Progress Notes (Signed)
VASCULAR LAB    TCD with bubbles has been performed.  See CV proc for preliminary results.   Kipling Graser, RVT 08/03/2021, 3:40 PM

## 2021-08-03 NOTE — Progress Notes (Signed)
VASCULAR LAB    Bilateral lower extremity venous duplex has been performed.  See CV proc for preliminary results.   Danna Sewell, RVT 08/03/2021, 3:39 PM

## 2021-08-03 NOTE — Progress Notes (Addendum)
STROKE TEAM PROGRESS NOTE       INTERVAL HISTORY Patient is seen in her room with her husband at bedside and daughter over the phone.  They are giving history that patient a month ago also having some vision difficulties and a few weeks ago was confused in a grocery store and bought a lot of stairs groceries without realizing and giving a good explanation for it..  Patient is a lot sleepier today and can barely be aroused given the stimulation.  She does not open her eyes or follow any commands.  She is moving all 4 extremities against gravity spontaneously. Potassium is slightly low at 3.2 and phosphorus at 2.1.  EEG done 2 days ago had shown mild diffuse encephalopathy.  MRI scan of the brain on 07/30/2021 had shown acute left PCA infarcts as well as bilateral cerebral and right cerebellar infarcts with an old right PCA and left frontoparietal infarct. Vitals:   08/03/21 0419 08/03/21 0500 08/03/21 0512 08/03/21 0753  BP: (!) 186/108  (!) 173/109 (!) 177/112  Pulse: 85  79 81  Resp: 16   20  Temp: 98.6 F (37 C)  98.7 F (37.1 C) 98.5 F (36.9 C)  TempSrc: Oral  Oral Axillary  SpO2: 100%  100% 100%  Weight:  100.5 kg    Height:       CBC:  Recent Labs  Lab 07/30/21 1712 07/31/21 0344 08/01/21 0448 08/02/21 0432  WBC 18.1*   < > 23.5* 21.5*  NEUTROABS 14.9*  --   --   --   HGB 16.1*   < > 14.0 14.9  HCT 47.1*   < > 42.1 43.4  MCV 92.4   < > 94.2 93.5  PLT 213   < > 197 223   < > = values in this interval not displayed.    Basic Metabolic Panel:  Recent Labs  Lab 08/02/21 0432 08/02/21 1431 08/02/21 1749 08/03/21 0501  NA 143  --   --  139  K 3.0*  --   --  3.2*  CL 102  --   --  101  CO2 24  --   --  29  GLUCOSE 123*  --   --  173*  BUN 15  --   --  19  CREATININE 1.08*  --   --  0.89  CALCIUM 8.8*  --   --  8.2*  MG  --    < > 2.4 2.3  PHOS  --    < > 2.7 2.1*   < > = values in this interval not displayed.    Lipid Panel:  Recent Labs  Lab 07/31/21 0344   CHOL 211*  TRIG 118  HDL 46  CHOLHDL 4.6  VLDL 24  LDLCALC 141*    HgbA1c:  Recent Labs  Lab 07/31/21 0344  HGBA1C 6.4*    Urine Drug Screen:  Recent Labs  Lab 07/30/21 2046  LABOPIA NONE DETECTED  COCAINSCRNUR NONE DETECTED  LABBENZ NONE DETECTED  AMPHETMU NONE DETECTED  THCU NONE DETECTED  LABBARB NONE DETECTED     Alcohol Level  Recent Labs  Lab 07/30/21 1712  ETH <10     IMAGING past 24 hours DG Abd Portable 1V  Result Date: 08/02/2021 CLINICAL DATA:  Feeding tube placement EXAM: PORTABLE ABDOMEN - 1 VIEW COMPARISON:  None. FINDINGS: Feeding tube with weighted tip in the gastric antrum/pyloric region. Stylet removed. Normal bowel-gas pattern. IMPRESSION: Feeding tube with tip in the gastric  antral/pyloric region. Electronically Signed   By: Suzy Bouchard M.D.   On: 08/02/2021 15:14    PHYSICAL EXAM General:  Patient is a obese middle-aged African-American female in no acute distress  Neuro:  Patient is lying in bed with eyes closed will moan in response to name but will not answer questions or follow commands.  Her eyes are tightly closed and she resists opening her eyes by examiner.  She does slightly open them occasionally.  She will move all four extremities spontaneously and purposefully against gravity but will not follow commands..  ASSESSMENT/PLAN Dorothy Alvarez is a 55 y.o. female with history of anxiety, depression, HTN and chronic pain presenting with lethargy and altered mental status.  She presented to [facility] ED at [facility] on 12/13 with progressive altered mental status and lethargy.  MRI was performed reveling and acute left PCA territory infarct, possibly embolic.  She was then transferred here for further evaluation.  EEG reveled no seizure activity.  Patient has required Cleviprex for BP control and was febrile yesterday to 101.1.  Plan is to obtain LP to rule out infectious causes of her altered mental status.  TEE will be performed  to rule out endocarditis.  Stroke: Multifocal infarcts including left PCA, right cerebellum, bilateral frontal and right thalamus likely embolic from endocarditis vs. cardiomyopathy with low EF CT head Chronic right occipital lobe infarct and degenerative changes in cervical spine. 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 Will recommend DOAC due to low EF if TEE negative for endocarditis. Once EF > 30%, can switch DOAC back to antiplatelet.  Therapy recommendations:  SNF Disposition:  pending  CHF 04/2021 EF 25 to 30%.  Per discharge, patient discharged on Lasix 80, Entresto 49/51 twice daily, Coreg 3.125 twice daily, hydralazine 75 3 times daily and isosorbide 15 twice daily.  However, dose medication was not on patient medication list on admission, concerning for medication noncompliance. Patient also did not follow-up with cardiology, concerning for noncompliance This admission, EF 20% TEE pending 12/20 Will recommend DOAC due to low EF if TEE negative for endocarditis. Once EF > 30%, can switch DOAC back to antiplatelet.   Encephalopathy vs. Functional status Altered mental status for 1 week prior to admission Still not follow commands, nonverbal at this time Observed spontaneous open eyes, looking around, watching RN hanging IV. However, not open eyes on request and actively resisting forced eye opening  Not following commands but able to sit at EOB  independently Able to swallow liquid but spitting out solids EEG moderate diffuse encephalopathy, no seizure LP performed and CSF-not consistent with CNS infection De-escalate antibiotics per primary team  UTI SIRS Afebrile for the last 48 hours Tachycardia resolved Leukocytosis WBC 18.1-22.5-23.5- > 21.5 Blood culture so far negative Urine culture ENTEROCOCCUS FAECALIS UA WBC 6-10 CSF no CNS infection TEE to rule out endocarditis -planned for 12/20 Antibiotics de-escalated to ampicillin  Hypertension Home meds:  amlodipine-benazepril 10-20 On the high end Off Cleviprex Amlodipine 10 mg restarted Keep BP <180/105,  gradually normalize BP Long-term BP goal normotensive   Hyperlipidemia Home meds: none LDL 141, goal < 70 Add atorvastatin 80 mg High intensity statin to be initiated Continue statin at discharge  Abdominal soft tissue mass CT abdomen showed 4.7 cm rounded soft tissue structure interposed between the gastric fundus and the upper pole left kidney. Given location, gastric diverticulum, pancreatic tail mass, or exophytic renal mass could give this appearance. Nonemergent follow-up CT of the abdomen with IV and oral contrast is recommended. CCM on board  Other Stroke Risk Factors Obesity, Body mass index is 35.76 kg/m., BMI >/= 30 associated with increased stroke risk, recommend weight loss, diet and exercise as appropriate   Other Active Problems Chronic pain syndrome Anxiety   Hospital day # 4  Patient seen and examined by NP/APP with MD. MD to update note as needed.   Janine Ores, DNP, FNP-BC [facility] Pager: [phone]  ATTENDING NOTE: I reviewed above note and agree with the assessment and plan. Pt was seen and examined.   Her husband is at the bedside.  Patient appears less awake than yesterday.   Non verbal and not following commands. However, no facial asymmetry, moving all extremities spontaneously but not per request.   Unclear how much of her neuro exam is related to underlying encephalopathy from her strokes versus patient's lack of cooperation.  For embolic strokes, etiology likely due to cardiomyopathy with low EF.  Agree with TEE to rule out endocarditis.  If it is ruled out, may consider DOAC for cardiomyopathy.  Once EF > 30%, may switch back to antiplatelet.  Patient does have UTI, sensitive to ampicillin, will continue.  Afebrile today, leukocytosis persists at 21.5 thousand.     BP on the higher end, amlodipine restarted, BP goal less than 180/105, close monitoring.  Continue statin and aspirin for now.   Continue core track tube feeds as patient not swallowing enough.  TEE pending next week.  We will check lower extremity venous Dopplers and transplant Doppler bubble study for PFO.  Replace potassium and phosphorus.  Add amantadine 100 mg twice daily to provoke wakefulness Greater than 50% time during this 35-minute visit was spent in counseling and coordination of care and discussion patient and husband and answering questions.     Antony Contras, MD  stroke Neurology 08/03/2021 10:37 AM       To contact Stroke Continuity provider, please refer to [email or web address] After hours, contact General Neurology

## 2021-08-03 NOTE — Progress Notes (Signed)
PROGRESS NOTE  Dorothy Alvarez  DOB: 06-27-1966  PCP: Nolene Ebbs, MD YCX:[id number]  DOA: 07/30/2021  LOS: 4 days  Hospital Day: 5  Chief Complaint  Patient presents with   lethargic   Emesis   Hypertension          Brief narrative: Dorothy Alvarez is a 55 y.o. female with PMH significant for HTN, chronic pain syndrome, bipolar disorder/depression  Patient was brought to the ED at [facility] on 12/13 with complaint of increasing somnolence, lethargy for last several days.  In the ER, she was noted to be febrile.  Labs showed elevated WBC count, AKI, rhabdomyolysis, lactic acidosis. CT renal study showed emphysematous cystitis and a 4.7 cm rounded soft tissue structure between the gastric fundus and left upper pole of kidney. 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   MRI of the brain showed a diffusion defect concerning for acute left PCA territory infarct.   Patient was started on IV fluid, IV antibiotics. Patient was admitted to hospitalist service, transferred to [facility] for neurology consult. On arrival to [facility], patient was noted to be critical and transferred to ICU service. Further work-up showed echo with a low LVEF of ~ 20%, LV with global hypokinesis, RV systolic function moderately reduced with RVSP ~65, LA mildly dilated, moderate to severe TVR.  With clinical improvement, patient was transferred out of ICU service to [facility] on 12/17.  Subjective: Patient was seen and examined this morning.  Middle-aged African-American female.  Tries to open eyes on verbal command but unable to follow any other commands.  Has NG tube for feeding. Husband at bedside.  Daughter on the phone. Chart reviewed In last 24 hours, patient is afebrile, heart rate in 80s mostly, blood pressure elevated up to 190s, breathing on 2 L oxygen medical  Assessment/Plan: Acute left PCA infarct -Per  neurology note, patient had multifocal infarcts including left PCA, right cerebellum, bilateral frontal and right thalamus.  Etiology likely embolic from endocarditis versus cardiomyopathy with low EF -MRI did not show large vessel occlusion but 50% stenosis of left PCA -Carotid duplex with nonsignificant stenosis of bilateral carotids -Echo with EF 20% and no LV thrombus -A1c 6.4, LDL 141 -Currently patient is unable to follow any commands, has NG tube for feeding. -Prior to admission, patient was not on any antiplatelet/anticoagulant Per neurology recommendation, patient is currently on aspirin 325 mg.  Pending TEE (scheduled for 12/20), if negative for endocarditis, neurology recommends DOAC.  Once EF improves to more than 30%, can switch DOAC back to antiplatelet. -Continue Lipitor 80 mg daily. -Continue PT OT follow-up.  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 -Urine culture sent on admission grew only 10,000 CFU per mL of Enterococcus faecalis. -Blood cultures on admission did not show any growth -Patient had lumbar puncture on 12/15 and CSF was sent for culture.  It showed predominantly mononuclear WBC but no organisms-not convincing for bacterial meningitis -Currently on ampicillin IV -Currently has Foley catheter.  Per PCCM, this was discussed with urology.  Acute metabolic encephalopathy  -multifactorial -- polypharmacy at home, AKI with rhabdo, suspected sepsis due to emphysematous cystitis, possible low flow state.  -Ammonia WNL, UDS neg, not hypercapnic on ABG, no evidence of sz on EEG  -LP 12/15 -- not convincing for bacterial meningitis  -Continue delirium precautions -Sedatives is on hold.   Acute  on chronic biventricular heart failure  Essential hypertension -Echo with a EF less than 20%, global hypokinesis, no LV thrombus, elevated RV systolic pressure to 66. -Was on Cleviprex in ICU.  Weaned down. -Although  her home med list does not show cardiac meds, discharge med list from September last year shows Coreg, Lasix, Entresto, Imdur, hydralazine.   -Blood pressure remains elevated up to 190s in last 24 hours. Currently on labetalol as needed. -We will resume Coreg, Imdur and hydralazine.  Continue to monitor blood pressure.  AKI -Creatinine was up at 1.73 on admission.  Gradually improving, 1.08 today.   Recent Labs    07/30/21 1712 07/31/21 0344 07/31/21 1840 08/01/21 0448 08/02/21 0432 08/03/21 0501  BUN 19 16 17  21* 15 19  CREATININE 1.73* 1.24* 1.60* 1.74* 1.08* 0.89   Rhabdomyolysis -On admission, CK level was elevated over 8000, probably related to prolonged immobility, AKI. -Improving.  Repeat labs.  Continue hydration with LR currently at 75 mill per hour. Recent Labs  Lab 07/30/21 1712 07/31/21 0344 08/02/21 0432  CKTOTAL 8,816* 5,023* 1,699*   Lactic acidosis -Lactic acid level trend as below, peaked at 5.3, last checked on 12/14 was 4.1.  We will repeat again today. Recent Labs  Lab 07/31/21 0112 07/31/21 0344 07/31/21 0901 07/31/21 1303 07/31/21 1840  LATICACIDVEN 3.7* 3.4* 3.6* 5.3* 4.1*    Mild transaminitis -Unclear cause, may be related to right heart failure itself.  Continue to monitor Recent Labs  Lab 07/30/21 1712 07/31/21 0344 07/31/21 1840 08/02/21 0432  AST 107* 80* 73* 125*  ALT 36 33 43 127*  ALKPHOS 87 73 107 109  BILITOT 1.7* 2.0* 1.5* 0.8  PROT 8.1 7.2 6.6 7.4  ALBUMIN 4.5 4.0 3.7 3.9   Hypokalemia/hypophosphatemia -Replace, recheck. Recent Labs  Lab 07/31/21 0344 07/31/21 1840 08/01/21 0448 08/02/21 0432 08/02/21 1431 08/02/21 1749 08/03/21 0501  K 3.5 3.3* 3.6 3.0*  --   --  3.2*  MG  --   --  1.9  --  2.5* 2.4 2.3  PHOS  --   --   --   --  2.7 2.7 2.1*   Bipolar depression Chronic Pain  -No meds on hold   Abdominal Mass  -Incidental finding of 4.7 cm soft tissue mass on CT renal study done on admission. -Once her  mental status and overall prognosis improves, we can consider obtaining CT abdomen pelvis with IV and oral contrast.  At this time, it would not change the course of her illness.  Mobility: PT eval Living condition: Was living at home with husband Goals of care:   Code Status: Full Code  Nutritional status: Body mass index is 35.76 kg/m.  Nutrition Problem: Inadequate oral intake Etiology: acute illness Signs/Symptoms: meal completion < 25% Diet:  Diet Order             Diet Heart Room service appropriate? Yes; Fluid consistency: Thin  Diet effective now                  DVT prophylaxis:  enoxaparin (LOVENOX) injection 40 mg Start: 08/02/21 1000 Place and maintain sequential compression device Start: 08/01/21 1458   Antimicrobials: IV ampicillin Fluid: LR@75  Consultants: Neurology Family Communication: Husband at bedside, daughter on the phone  Status is: Inpatient  Continue in-hospital care because: Continues to need antibiotics, pending PT eval.  Continues tube feeding Level of care: Progressive   Dispo: The patient is from: Home  Anticipated d/c is to: Unclear at this time              Patient currently is not medically stable to d/c.   Difficult to place patient No     Infusions:   ampicillin (OMNIPEN) IV 2 g (08/03/21 1300)   feeding supplement (OSMOLITE 1.5 CAL) 1,000 mL (08/02/21 1529)   lactated ringers     lactated ringers 75 mL/hr at 08/03/21 1006   potassium PHOSPHATE IVPB (in mmol) 30 mmol (08/03/21 1005)    Scheduled Meds:   stroke: mapping our early stages of recovery book   Does not apply Once   amantadine  100 mg Per Tube BID   amLODipine  10 mg Per Tube Daily   aspirin  300 mg Rectal Daily   Or   aspirin  325 mg Oral Daily   atorvastatin  80 mg Oral Daily   chlorhexidine  15 mL Mouth Rinse BID   Chlorhexidine Gluconate Cloth  6 each Topical Daily   enoxaparin (LOVENOX) injection  40 mg Subcutaneous Q24H   feeding supplement  (PROSource TF)  90 mL Per Tube BID   mouth rinse  15 mL Mouth Rinse q12n4p   naLOXone (NARCAN)  injection  0.4 mg Intravenous Once    PRN meds: acetaminophen **OR** acetaminophen (TYLENOL) oral liquid 160 mg/5 mL **OR** acetaminophen, labetalol, ondansetron (ZOFRAN) IV   Antimicrobials: Anti-infectives (From admission, onward)    Start     Dose/Rate Route Frequency Ordered Stop   08/02/21 1000  vancomycin (VANCOREADY) IVPB 1250 mg/250 mL  Status:  Discontinued        1,250 mg 166.7 mL/hr over 90 Minutes Intravenous Every 24 hours 08/01/21 1008 08/02/21 1002   08/01/21 1115  ampicillin (OMNIPEN) 2 g in sodium chloride 0.9 % 100 mL IVPB        2 g 300 mL/hr over 20 Minutes Intravenous Every 6 hours 08/01/21 1023     08/01/21 1100  cefTRIAXone (ROCEPHIN) 2 g in sodium chloride 0.9 % 100 mL IVPB  Status:  Discontinued        2 g 200 mL/hr over 30 Minutes Intravenous Every 12 hours 08/01/21 1000 08/02/21 1002   08/01/21 1100  vancomycin (VANCOREADY) IVPB 2000 mg/400 mL        2,000 mg 200 mL/hr over 120 Minutes Intravenous  Once 08/01/21 1007 08/01/21 1349   08/01/21 0800  cefTRIAXone (ROCEPHIN) 2 g in sodium chloride 0.9 % 100 mL IVPB  Status:  Discontinued        2 g 200 mL/hr over 30 Minutes Intravenous Every 24 hours 07/31/21 1354 08/01/21 1000   07/31/21 0800  cefTRIAXone (ROCEPHIN) 1 g in sodium chloride 0.9 % 100 mL IVPB  Status:  Discontinued        1 g 200 mL/hr over 30 Minutes Intravenous Every 24 hours 07/30/21 2349 07/31/21 1354   07/30/21 1915  ceFEPIme (MAXIPIME) 2 g in sodium chloride 0.9 % 100 mL IVPB        2 g 200 mL/hr over 30 Minutes Intravenous  Once 07/30/21 1907 07/30/21 2020   07/30/21 1900  vancomycin (VANCOCIN) IVPB 1000 mg/200 mL premix        1,000 mg 200 mL/hr over 60 Minutes Intravenous  Once 07/30/21 1847 07/30/21 2133       Objective: Vitals:   08/03/21 0753 08/03/21 1130  BP: (!) 177/112 (!) 159/121  Pulse: 81 84  Resp: 20 20  Temp: 98.5 F  (36.9 C) 98  F (36.7 C)  SpO2: 100% 100%    Intake/Output Summary (Last 24 hours) at 08/03/2021 1356 Last data filed at 08/03/2021 0600 Gross per 24 hour  Intake 426.01 ml  Output 2150 ml  Net -1723.99 ml   Filed Weights   07/31/21 0722 08/03/21 0500  Weight: 102 kg 100.5 kg   Weight change:  Body mass index is 35.76 kg/m.   Physical Exam: General exam: Middle-aged African-American female.  Spontaneous movements seen but unable to follow commands.  Does not seem to be in pain Skin: No rashes, lesions or ulcers. HEENT: Atraumatic, normocephalic, no obvious bleeding.  Has NG tube in place Lungs: Clear to auscultation bilaterally CVS: Regular rate and rhythm, no murmur GI/Abd soft, nontender, nondistended, bowel sound present CNS: Tries to open eyes on verbal command.  Unable to follow any other commands.  Spontaneous movement seen Psychiatry: Sad affect Extremities: No pedal edema, no calf tenderness  Data Review: I have personally reviewed the laboratory data and studies available.  F/u labs ordered Unresulted Labs (From admission, onward)     Start     Ordered   08/04/21 0500  CBC with Differential/Platelet  Daily,   R     Question:  Specimen collection method  Answer:  Lab=Lab collect   08/03/21 1349   08/04/21 7035  Basic metabolic panel  Daily,   R     Question:  Specimen collection method  Answer:  Lab=Lab collect   08/03/21 1349   08/04/21 0500  Lactic acid, plasma  Tomorrow morning,   R       Question:  Specimen collection method  Answer:  Lab=Lab collect   08/03/21 1349   08/04/21 0500  Ammonia  Tomorrow morning,   R       Question:  Specimen collection method  Answer:  Lab=Lab collect   08/03/21 1349   08/04/21 0500  Hepatic function panel  Tomorrow morning,   R       Question:  Specimen collection method  Answer:  Lab=Lab collect   08/03/21 1350   08/02/21 1415  Magnesium  (ICU Tube Feeding: PEPuP )  5A & 5P,   R     Question:  Specimen collection method   Answer:  Lab=Lab collect   08/02/21 1414   08/02/21 1415  Phosphorus  (ICU Tube Feeding: PEPuP )  5A & 5P,   R     Question:  Specimen collection method  Answer:  Lab=Lab collect   08/02/21 1414   08/01/21 1218  HSV 1/2 PCR, CSF Cerebrospinal Fluid  Once,   R        08/01/21 1218   08/01/21 1218  HIV Antibody (routine testing w rflx)  (Meningitis Panel)  Once,   R       Question:  Specimen collection method  Answer:  Lab=Lab collect   08/01/21 1218            Signed, Terrilee Croak, MD [facility] 08/03/2021

## 2021-08-04 DIAGNOSIS — I5042 Chronic combined systolic (congestive) and diastolic (congestive) heart failure: Secondary | ICD-10-CM

## 2021-08-04 DIAGNOSIS — M6282 Rhabdomyolysis: Secondary | ICD-10-CM

## 2021-08-04 DIAGNOSIS — Z7189 Other specified counseling: Secondary | ICD-10-CM

## 2021-08-04 DIAGNOSIS — R6510 Systemic inflammatory response syndrome (SIRS) of non-infectious origin without acute organ dysfunction: Secondary | ICD-10-CM

## 2021-08-04 DIAGNOSIS — Z515 Encounter for palliative care: Secondary | ICD-10-CM

## 2021-08-04 DIAGNOSIS — R1900 Intra-abdominal and pelvic swelling, mass and lump, unspecified site: Secondary | ICD-10-CM

## 2021-08-04 LAB — GLUCOSE, CAPILLARY
Glucose-Capillary: 167 mg/dL — ABNORMAL HIGH (ref 70–99)
Glucose-Capillary: 156 mg/dL — ABNORMAL HIGH (ref 70–99)
Glucose-Capillary: 141 mg/dL — ABNORMAL HIGH (ref 70–99)
Glucose-Capillary: 134 mg/dL — ABNORMAL HIGH (ref 70–99)
Glucose-Capillary: 205 mg/dL — ABNORMAL HIGH (ref 70–99)

## 2021-08-04 LAB — CULTURE, BLOOD (ROUTINE X 2)
Culture: NO GROWTH
Culture: NO GROWTH

## 2021-08-04 LAB — HEPATIC FUNCTION PANEL
ALT: 95 U/L — ABNORMAL HIGH (ref 0–44)
AST: 65 U/L — ABNORMAL HIGH (ref 15–41)
Albumin: 3.2 g/dL — ABNORMAL LOW (ref 3.5–5.0)
Alkaline Phosphatase: 96 U/L (ref 38–126)
Bilirubin, Direct: 0.2 mg/dL (ref 0.0–0.2)
Indirect Bilirubin: 0.6 mg/dL (ref 0.3–0.9)
Total Bilirubin: 0.8 mg/dL (ref 0.3–1.2)
Total Protein: 6.2 g/dL — ABNORMAL LOW (ref 6.5–8.1)

## 2021-08-04 LAB — CBC WITH DIFFERENTIAL/PLATELET
Abs Immature Granulocytes: 0.07 10*3/uL (ref 0.00–0.07)
Basophils Absolute: 0 10*3/uL (ref 0.0–0.1)
Basophils Relative: 0 %
Eosinophils Absolute: 0.6 10*3/uL — ABNORMAL HIGH (ref 0.0–0.5)
Eosinophils Relative: 5 %
HCT: 46.6 % — ABNORMAL HIGH (ref 36.0–46.0)
Hemoglobin: 15.4 g/dL — ABNORMAL HIGH (ref 12.0–15.0)
Immature Granulocytes: 1 %
Lymphocytes Relative: 16 %
Lymphs Abs: 2 10*3/uL (ref 0.7–4.0)
MCH: 30.9 pg (ref 26.0–34.0)
MCHC: 33 g/dL (ref 30.0–36.0)
MCV: 93.4 fL (ref 80.0–100.0)
Monocytes Absolute: 0.9 10*3/uL (ref 0.1–1.0)
Monocytes Relative: 7 %
Neutro Abs: 8.9 10*3/uL — ABNORMAL HIGH (ref 1.7–7.7)
Neutrophils Relative %: 71 %
Platelets: 298 10*3/uL (ref 150–400)
RBC: 4.99 MIL/uL (ref 3.87–5.11)
RDW: 13 % (ref 11.5–15.5)
WBC: 12.6 10*3/uL — ABNORMAL HIGH (ref 4.0–10.5)
nRBC: 0 % (ref 0.0–0.2)

## 2021-08-04 LAB — BASIC METABOLIC PANEL
Anion gap: 10 (ref 5–15)
BUN: 16 mg/dL (ref 6–20)
CO2: 29 mmol/L (ref 22–32)
Calcium: 9 mg/dL (ref 8.9–10.3)
Chloride: 104 mmol/L (ref 98–111)
Creatinine, Ser: 0.86 mg/dL (ref 0.44–1.00)
GFR, Estimated: >60 mL/min (ref >60)
Glucose, Bld: 174 mg/dL — ABNORMAL HIGH (ref 70–99)
Potassium: 3.1 mmol/L — ABNORMAL LOW (ref 3.5–5.1)
Sodium: 143 mmol/L (ref 135–145)

## 2021-08-04 LAB — CSF CULTURE W GRAM STAIN: Culture: NO GROWTH

## 2021-08-04 LAB — AMMONIA: Ammonia: 60 umol/L — ABNORMAL HIGH (ref 9–35)

## 2021-08-04 LAB — LACTIC ACID, PLASMA: Lactic Acid, Venous: 1.7 mmol/L (ref 0.5–1.9)

## 2021-08-04 LAB — CK: Total CK: 314 U/L — ABNORMAL HIGH (ref 38–234)

## 2021-08-04 MED ORDER — ASPIRIN 325 MG PO TABS
325.0000 mg | ORAL_TABLET | Freq: Every day | ORAL | Status: DC
  Administered 2021-08-04 – 2021-08-06 (×3): 325 mg
  Filled 2021-08-04 (×2): qty 1

## 2021-08-04 MED ORDER — CARVEDILOL 3.125 MG PO TABS: 3.1250 mg | ORAL_TABLET | Freq: Two times a day (BID) | ORAL | Status: DC

## 2021-08-04 MED ORDER — HYDRALAZINE HCL 25 MG PO TABS
25.0000 mg | ORAL_TABLET | Freq: Four times a day (QID) | ORAL | Status: DC | PRN
  Administered 2021-08-04 – 2021-08-11 (×4): 25 mg
  Filled 2021-08-04 (×4): qty 1

## 2021-08-04 MED ORDER — ISOSORBIDE DINITRATE 10 MG PO TABS
10.0000 mg | ORAL_TABLET | Freq: Three times a day (TID) | ORAL | Status: DC
  Administered 2021-08-04 – 2021-08-14 (×31): 10 mg
  Filled 2021-08-04 (×35): qty 1

## 2021-08-04 MED ORDER — HYDRALAZINE HCL 25 MG PO TABS: 25.0000 mg | ORAL_TABLET | Freq: Four times a day (QID) | ORAL | Status: DC | PRN

## 2021-08-04 MED ORDER — ATORVASTATIN CALCIUM 80 MG PO TABS
80.0000 mg | ORAL_TABLET | Freq: Every day | ORAL | Status: DC
  Administered 2021-08-04 – 2021-08-14 (×11): 80 mg
  Filled 2021-08-04 (×12): qty 1

## 2021-08-04 MED ORDER — ASPIRIN 300 MG RE SUPP: 300.0000 mg | Freq: Every day | RECTAL | Status: DC

## 2021-08-04 MED ORDER — POTASSIUM CHLORIDE 10 MEQ/100ML IV SOLN
10.0000 meq | INTRAVENOUS | Status: AC
  Administered 2021-08-04 (×3): 10 meq via INTRAVENOUS
  Filled 2021-08-04 (×3): qty 100

## 2021-08-04 MED ORDER — CARVEDILOL 3.125 MG PO TABS
3.1250 mg | ORAL_TABLET | Freq: Two times a day (BID) | ORAL | Status: DC
  Administered 2021-08-04 – 2021-08-05 (×2): 3.125 mg
  Filled 2021-08-04 (×2): qty 1

## 2021-08-04 MED ORDER — ISOSORBIDE MONONITRATE ER 30 MG PO TB24: 30.0000 mg | ORAL_TABLET | Freq: Every day | ORAL | Status: DC

## 2021-08-04 NOTE — Consult Note (Signed)
Palliative Care Consult Note                                  Date: 08/04/2021   [name]  DOB: 05-May-1966  MRN: [id number]  Age / Sex: 55 y.o., female  PCP: Nolene Ebbs, MD Referring Physician: Terrilee Croak, MD  Reason for Consultation: Establishing goals of care  HPI/Patient Profile: 55 y.o. female  with past medical history of HTN, chronic pain syndrome, bipolar disorder/depression. She was brought to the ED at [facility] on 12/13 with complaint of increasing somnolence, lethargy for last several days.  Work-up in the emergency department found febrile illness with elevated WBC count, AKI, rhabdomyolysis, lactic acidosis.  CT renal study showed emphysematous cystitis and a 4.7 cm soft tissue structure between the gastric fundus and left upper pole of the kidney.  She was admitted on 07/30/2021 with severe sepsis due to emphysematous cystitis, bilateral multifocal stroke, dysphagia, metabolic encephalopathy, acute on chronic biventricular heart failure, AKI, rhabdomyolysis, lactic acidosis, abdominal mass, and others.   PMT was consulted for goals of care conversation.  Past Medical History:  Diagnosis Date   Allergy    Anxiety    Arthritis    Chronic abdominal pain    Constipation    Depression    Hypertension    IBS (irritable bowel syndrome)    Learning disability    Neuromuscular disorder (HCC)     Subjective:   This NP Walden Field reviewed medical records, received report from team, assessed the patient and then meet at the patient's bedside to discuss diagnosis, prognosis, GOC, EOL wishes disposition and options.  I met with the patient's husband Dorothy Alvarez, daughter Dorothy Alvarez at the bedside I briefly met the patient's sister Dorothy Alvarez and her mother who left within minutes of my arrival.   Concept of Palliative Care was introduced as specialized medical care for people and their families living with serious  illness.  If focuses on providing relief from the symptoms and stress of a serious illness.  The goal is to improve quality of life for both the patient and the family. Values and goals of care important to patient and family were attempted to be elicited.  Created space and opportunity for patient  and family to explore thoughts and feelings regarding current medical situation   Natural trajectory and current clinical status were discussed. Questions and concerns addressed. Patient  encouraged to call with questions or concerns.    Patient/Family Understanding of Illness: They understand that she is very sick.  Her husband states he is confused about what is going on as "1 doctor comes in and tells me this and another doctor comes in and tells me that.  He does know that if there is no improvement he will need to place a feeding tube.  There is a TEE planned for couple days from now.  Also if no improvement he is assuming they will just "send her home."  I spent a significant amount of time explaining her acute and chronic conditions, the interplay between these, and how these are affecting her overall health and trajectory of illness.  At baseline the patient was independent and functional.  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  She then worked at [employer] in a similar role until  she fell and injured her knees.  Since then she has stated home and been a homemaker while her husband works full-time as a Administrator.  She is described as a Librarian, academic for cleanliness".  Her husband shares that he never slept more than 1 night on the same side sheets that she would change sheets every day.  He states that she "lights up her room when she enters" and is very social and never kept a stranger (very social and makes friends with all types of people).  He  also states that she is a kind of person who would give you the shirt off her back if he needed it.  Patient Values: Faith, family  Goals: The only goal that they could decide today is that they do not want her going to a nursing home.  Her husband describes a significant support system who have all verbalized agreement to chip and help him take care of her at home, even if 24-hour care is needed.  Today's Discussion: We had an extensive discussion regarding her clinical illness, trajectory of care, plan studies and diagnostic procedures.  We discussed her life in depth and the qualities that she had.  I can tell from my conversation that her husband is very devoted to her and loves her very much.  He describes how much she loves cooking and that she made great collard greens and mac & cheese.  However, she did not do much baking as he would do most of that.  He describes a significant support system including multiple relatives and friends who involved expressed a willingness to help care for her at home if needed.  He states that he would be able to retire in order to help care for his wife.  He does not want her going to a nursing home.  Several family members also have healthcare experience (the patient's mom previously worked on 3700 unit at [employer] and the patient's sister is a medication tech).  We discussed goals and CODE STATUS.  I explained CODE STATUS and the decreased likelihood that CPR would be effective in her situation with multiple chronic and acute illnesses.  His daughter was present for this as well.  They would like to discuss amongst family before making any decisions.  I provided emotional general support through therapeutic listening, empathy, sharing of stories, reminiscing, and other techniques.  I answered all questions and addressed all concerns to the best my ability  Review of Systems  Unable to perform ROS: Patient unresponsive   Objective:   Primary  Diagnoses: Present on Admission:  Acute encephalopathy  Acute ischemic stroke ([place])  (Resolved) Sepsis ([place])  AKI (acute kidney injury) ([place])  Rhabdomyolysis  Essential hypertension  SIRS (systemic inflammatory response syndrome) ([place])  Mass of soft tissue of abdomen  Chronic combined systolic and diastolic CHF (congestive heart failure) ([place])  Bipolar II disorder ([place])  MDD (major depressive disorder)   Physical Exam Vitals and nursing note reviewed.  Constitutional:      General: She is not in acute distress. HENT:     Head: Normocephalic and atraumatic.  Cardiovascular:     Rate and Rhythm: Normal rate.  Pulmonary:     Effort: Pulmonary effort is normal. No respiratory distress.     Breath sounds: No wheezing or rhonchi.  Abdominal:     General: Abdomen is flat. Bowel sounds are normal.     Palpations: Abdomen is soft.  Neurological:     Mental Status:  She is unresponsive.     Comments: Moves extremities spontaneously but does not follow commands.    Vital Signs:  BP (!) 162/105 (BP Location: Right Arm) Comment: RN Notified   Pulse 85    Temp 98.1 F (36.7 C) (Oral)    Resp 20    Ht _0  (1.676 m)    Wt 99.1 kg    SpO2 100%    BMI 35.26 kg/m   Palliative Assessment/Data: 30% (current status bedbound, on TF)    Advanced Care Planning:   Primary Decision Maker: NEXT OF KIN  Code Status/Advance Care Planning: Full code  A discussion was had today regarding advanced directives. Concepts specific to code status, artifical feeding and hydration, continued IV antibiotics and rehospitalization was had.  The difference between a aggressive medical intervention path and a palliative comfort care path for this patient at this time was had.   Decisions/Changes to ACP: None today  Assessment & Plan:   Impression: 55 year old female with multiple acute on chronic comorbidities as described above.  Her biggest issues currently are acute on chronic biventricular failure  with an EF estimated to be less than 20%, although a TEE is planned for Tuesday.  Also a major driving focus is bilateral multifocal pneumonia essentially unresponsive, not following commands but does move extremities spontaneously.  Deemed overall poor prognosis.  NG tube in place, would need consideration for PEG tube if family desires.  SUMMARY OF RECOMMENDATIONS   Continue full code Continue full scope of treatment We will follow-up in a couple days for further goals of care discussions Need time for outcomes and further testing in the interim PMT will continue to follow  Symptom Management:  Per primary team PMT is available to assist as needed  Prognosis:  Unable to determine  Discharge Planning:  To Be Determined   Discussed with: Medical team, nursing team, patient's family    Thank you for allowing [place] to participate in the care of Dorothy Alvarez PMT will continue to support holistically.  Time Total: 105 min  Greater than 50%  of this time was spent counseling and coordinating care related to the above assessment and plan.  Signed by: Walden Field, NP Palliative Medicine Team  Team Phone # [phone] (Nights/Weekends)  08/04/2021, 4:15 PM

## 2021-08-04 NOTE — Plan of Care (Signed)
°  Problem: Education: Goal: Knowledge of General Education information will improve Description: Including pain rating scale, medication(s)/side effects and non-pharmacologic comfort measures Outcome: Progressing   Problem: Health Behavior/Discharge Planning: Goal: Ability to manage health-related needs will improve Outcome: Progressing   Problem: Clinical Measurements: Goal: Ability to maintain clinical measurements within normal limits will improve Outcome: Progressing Goal: Will remain free from infection Outcome: Progressing Goal: Diagnostic test results will improve Outcome: Progressing Goal: Respiratory complications will improve Outcome: Progressing Goal: Cardiovascular complication will be avoided Outcome: Progressing   Problem: Elimination: Goal: Will not experience complications related to bowel motility Outcome: Progressing Goal: Will not experience complications related to urinary retention Outcome: Progressing   Problem: Coping: Goal: Level of anxiety will decrease Outcome: Progressing   

## 2021-08-04 NOTE — Progress Notes (Signed)
STROKE TEAM PROGRESS NOTE       INTERVAL HISTORY Patient is seen in her room with her husband at bedside  .  Patient continues to be lying in bed with eyes tightly closed.  She actively resists forceful eye opening and will refuse to open them.  She tries to speak with his nonverbal.  She does not follow commands.  She is able to move all 4 extremities voluntarily but will not do so on commands.  Vital signs stable.  Neurological exam unchanged.  She has been started on amantadine but does not seem to be helpful so far..  TCD bubble study done yesterday was negative for significant right-to-left shunt..  Labs today show improvement in lactic acid and liver enzymes are mildly elevated and ammonia is elevated at 60.  Potassium is still low at 3.1 Vitals:   08/04/21 0500 08/04/21 0528 08/04/21 0755 08/04/21 1146  BP:  (!) 183/105 (!) 189/115 (!) 162/105  Pulse:   81 85  Resp:   15 20  Temp:   97.6 F (36.4 C) 98.1 F (36.7 C)  TempSrc:   Oral Oral  SpO2:   100% 100%  Weight: 99.1 kg     Height:       CBC:  Recent Labs  Lab 07/30/21 1712 07/31/21 0344 08/02/21 0432 08/04/21 0640  WBC 18.1*   < > 21.5* 12.6*  NEUTROABS 14.9*  --   --  8.9*  HGB 16.1*   < > 14.9 15.4*  HCT 47.1*   < > 43.4 46.6*  MCV 92.4   < > 93.5 93.4  PLT 213   < > 223 298   < > = values in this interval not displayed.   Basic Metabolic Panel:  Recent Labs  Lab 08/03/21 0501 08/03/21 1701 08/04/21 0640  NA 139  --  143  K 3.2*  --  3.1*  CL 101  --  104  CO2 29  --  29  GLUCOSE 173*  --  174*  BUN 19  --  16  CREATININE 0.89  --  0.86  CALCIUM 8.2*  --  9.0  MG 2.3 2.2  --   PHOS 2.1* 3.1  --    Lipid Panel:  Recent Labs  Lab 07/31/21 0344  CHOL 211*  TRIG 118  HDL 46  CHOLHDL 4.6  VLDL 24  LDLCALC 141*   HgbA1c:  Recent Labs  Lab 07/31/21 0344  HGBA1C 6.4*   Urine Drug Screen:  Recent Labs  Lab 07/30/21 2046  LABOPIA NONE DETECTED  COCAINSCRNUR NONE DETECTED  LABBENZ NONE  DETECTED  AMPHETMU NONE DETECTED  THCU NONE DETECTED  LABBARB NONE DETECTED    Alcohol Level  Recent Labs  Lab 07/30/21 1712  ETH <10    IMAGING past 24 hours VAS [place] TRANSCRANIAL DOPPLER W BUBBLES  Result Date: 08/04/2021  Transcranial Doppler with Bubble Patient Name:  Dorothy Alvarez  Date of Exam:   08/03/2021 Medical Rec #: [id number]    Accession #:    [id number] Date of Birth: [birth year]    [name] Patient Age:   73 years Exam Location:  [facility] Procedure:      VAS [place] TRANSCRANIAL DOPPLER W BUBBLES Referring Phys: Terrilee Croak --------------------------------------------------------------------------------  Indications: Stroke. History: Acute left PCA territory infarct, possibly embolic. Hypertension. Limitations: Difficult windows, altered mental status Comparison Study: No prior study Performing Technologist: Sharion Dove RVS  Examination Guidelines: A complete evaluation includes B-mode imaging, spectral Doppler,  color Doppler, and power Doppler as needed of all accessible portions of each vessel. Bilateral testing is considered an integral part of a complete examination. Limited examinations for reoccurring indications may be performed as noted.  Summary: No HITS at rest or during Valsalva. Negative transcranial Doppler Bubble study with no evidence of right to left intracardiac communication.  *See table(s) above for TCD measurements and observations.  Diagnosing physician: Antony Contras MD Electronically signed by Antony Contras MD on 08/04/2021 at 1:05:49 PM.    Final    VAS [place] LOWER EXTREMITY VENOUS (DVT)  Result Date: 08/04/2021  Lower Venous DVT Study Patient Name:  Dorothy Alvarez  Date of Exam:   08/03/2021 Medical Rec #: [id number]    Accession #:    [id number] Date of Birth: 1966/01/04    [name] Patient Age:   84 years Exam Location:  [facility] Procedure:      VAS [place] LOWER EXTREMITY VENOUS (DVT) Referring Phys: Terrilee Croak  --------------------------------------------------------------------------------  Indications: Stroke.  Limitations: Altered mental status and constant movement of left lower extremity. Comparison Study: No prior study Performing Technologist: Sharion Dove RVS  Examination Guidelines: A complete evaluation includes B-mode imaging, spectral Doppler, color Doppler, and power Doppler as needed of all accessible portions of each vessel. Bilateral testing is considered an integral part of a complete examination. Limited examinations for reoccurring indications may be performed as noted. The reflux portion of the exam is performed with the patient in reverse Trendelenburg.  +---------+---------------+---------+-----------+----------+--------------+  RIGHT     Compressibility Phasicity Spontaneity Properties Thrombus Aging  +---------+---------------+---------+-----------+----------+--------------+  CFV       Full            Yes       Yes                                    +---------+---------------+---------+-----------+----------+--------------+  SFJ       Full                                                             +---------+---------------+---------+-----------+----------+--------------+  FV Prox   Full                                                             +---------+---------------+---------+-----------+----------+--------------+  FV Mid    Full                                                             +---------+---------------+---------+-----------+----------+--------------+  FV Distal Full                                                             +---------+---------------+---------+-----------+----------+--------------+  PFV       Full            Yes       Yes                                    +---------+---------------+---------+-----------+----------+--------------+  POP       Full                                                              +---------+---------------+---------+-----------+----------+--------------+  PTV       Full                                                             +---------+---------------+---------+-----------+----------+--------------+  PERO      Full                                                             +---------+---------------+---------+-----------+----------+--------------+   +---------+---------------+---------+-----------+----------+-------------------+  LEFT      Compressibility Phasicity Spontaneity Properties Thrombus Aging       +---------+---------------+---------+-----------+----------+-------------------+  CFV       Full            Yes       Yes                                         +---------+---------------+---------+-----------+----------+-------------------+  SFJ       Full                                                                  +---------+---------------+---------+-----------+----------+-------------------+  FV Prox                   Yes       Yes                    patent by color and                                                              Doppler              +---------+---------------+---------+-----------+----------+-------------------+  FV Mid    Full                                                                  +---------+---------------+---------+-----------+----------+-------------------+  FV Distal Full                                                                  +---------+---------------+---------+-----------+----------+-------------------+  PFV                                                        patent by color and                                                              Doppler              +---------+---------------+---------+-----------+----------+-------------------+  POP       Full            Yes       Yes                                         +---------+---------------+---------+-----------+----------+-------------------+  PTV       Full                                                                   +---------+---------------+---------+-----------+----------+-------------------+  PERO                                                       Not well visualized  +---------+---------------+---------+-----------+----------+-------------------+     Summary: BILATERAL: -No evidence of popliteal cyst, bilaterally. RIGHT: - There is no evidence of deep vein thrombosis in the lower extremity. However, portions of this examination were limited- see technologist comments above.  LEFT: - There is no evidence of deep vein thrombosis in the lower extremity. However, portions of this examination were limited- see technologist comments above.  *See table(s) above for measurements and observations. Electronically signed by Orlie Pollen on 08/04/2021 at 12:07:03 PM.    Final     PHYSICAL EXAM General:  Patient is a obese middle-aged African-American female in no acute distress  Neuro:  Patient is lying in bed with eyes closed will moan in response to name but will not answer questions or follow commands.  Her eyes are tightly closed and she resists opening her eyes by examiner.  She does slightly open them occasionally.  She will move all four extremities spontaneously and purposefully against gravity but will not follow commands..  ASSESSMENT/PLAN Dorothy Alvarez is a 55 y.o. female with history of anxiety, depression, HTN and chronic pain presenting with  lethargy and altered mental status.  She presented to the ED at [facility] on 12/13 with progressive altered mental status and lethargy.  MRI was performed reveling and acute left PCA territory infarct, possibly embolic.  She was then transferred here for further evaluation.  EEG reveled no seizure activity.  Patient has required Cleviprex for BP control and was febrile yesterday to 101.1.  Plan is to obtain LP to rule out infectious causes of her altered mental status.  TEE will be performed to rule out  endocarditis.  Stroke: Multifocal infarcts including left PCA, right cerebellum, bilateral frontal and right thalamus likely embolic from endocarditis vs. cardiomyopathy with low EF CT head Chronic right occipital lobe infarct and degenerative changes in cervical spine. 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 Will recommend DOAC due to low EF if TEE negative for endocarditis. Once EF > 30%, can switch DOAC back to antiplatelet.  Therapy recommendations:  SNF Disposition:  pending  CHF 04/2021 EF 25 to 30%.  Per discharge, patient discharged on Lasix 80, Entresto 49/51 twice daily, Coreg 3.125 twice daily, hydralazine 75 3 times daily and isosorbide 15 twice daily.  However, dose medication was not on patient medication list on admission, concerning for medication noncompliance. Patient also did not follow-up with cardiology, concerning for noncompliance This admission, EF 20% TEE pending 12/20 Will recommend DOAC due to low EF if TEE negative for endocarditis. Once EF > 30%, can switch DOAC back to antiplatelet.   Encephalopathy vs. Functional status Altered mental status for 1 week prior to admission Still not follow commands, nonverbal at this time Observed spontaneous open eyes, looking around, watching RN hanging IV. However, not open eyes on request and actively resisting forced eye opening  Not following commands but able to sit at EOB independently Able to  swallow liquid but spitting out solids EEG moderate diffuse encephalopathy, no seizure LP performed and CSF-not consistent with CNS infection De-escalate antibiotics per primary team  UTI SIRS Afebrile for the last 48 hours Tachycardia resolved Leukocytosis WBC 18.1-22.5-23.5- > 21.5 Blood culture so far negative Urine culture ENTEROCOCCUS FAECALIS UA WBC 6-10 CSF no CNS infection TEE to rule out endocarditis -planned for 12/20 Antibiotics de-escalated to ampicillin  Hypertension Home meds:  amlodipine-benazepril 10-20 On the high end Off Cleviprex Amlodipine 10 mg restarted Keep BP <180/105, gradually normalize BP Long-term BP goal normotensive   Hyperlipidemia Home meds: none LDL 141, goal < 70 Add atorvastatin 80 mg High intensity statin to be initiated Continue statin at discharge  Abdominal soft tissue mass CT abdomen showed 4.7 cm rounded soft tissue structure interposed between the gastric fundus and the upper pole left kidney. Given location, gastric diverticulum, pancreatic tail mass, or exophytic renal mass could give this appearance. Nonemergent follow-up CT of the abdomen with IV and oral contrast is recommended. CCM on board  Other Stroke Risk Factors Obesity, Body mass index is 35.26 kg/m., BMI >/= 30 associated with increased stroke risk, recommend weight loss, diet and exercise as appropriate  Other Active Problems Chronic pain syndrome Anxiety   Hospital day # 5  Await TEE hopefully next few days.  Continue amantadine to improve arousal.  Continue statin and aspirin for now.   Continue core track tube feeds as patient not swallowing enough.  Continue electrolyte replacement as per primary team discussed with Dr. Pietro Cassis. Greater than 50% time during this 25-minute visit was spent in counseling and coordination of care and discussion patient and husband and answering questions.     Antony Contras, MD  stroke Neurology 08/04/2021 1:21 PM        To contact Stroke Continuity provider, please refer to [email or web address] After hours, contact General Neurology

## 2021-08-04 NOTE — Progress Notes (Signed)
PROGRESS NOTE  Dorothy Alvarez  DOB: [birth year]  PCP: Nolene Ebbs, MD NKN:[id number]  DOA: 07/30/2021  LOS: 5 days  Hospital Day: 6  Chief Complaint  Patient presents with   lethargic   Emesis   Hypertension          Brief narrative: Mahoganie Basher is a 55 y.o. female with PMH significant for HTN, chronic pain syndrome, bipolar disorder/depression  Patient was brought to the ED at [facility] on 12/13 with complaint of increasing somnolence, lethargy for last several days.  In the ER, she was noted to be febrile.  Labs showed elevated WBC count, AKI, rhabdomyolysis, lactic acidosis. CT renal study showed emphysematous cystitis and a 4.7 cm rounded soft tissue structure between the gastric fundus and left upper pole of kidney. 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   MRI of the brain showed a diffusion defect concerning for acute left PCA territory infarct.   Patient was started on IV fluid, IV antibiotics. Patient was admitted to hospitalist service, transferred to [facility] for neurology consult. On arrival to [facility], patient was noted to be critical and transferred to ICU service. Further work-up showed echo with a low LVEF of ~ 20%, LV with global hypokinesis, RV systolic function moderately reduced with RVSP ~65, LA mildly dilated, moderate to severe TVR.  With clinical improvement, patient was transferred out of ICU service to [facility] on 12/17.  Subjective: Patient was seen and examined this morning.   I can see her eyelids moving as if she is trying to open eyes on command but not able to.  Not able to follow any commands.  NG tube feeding ongoing.  No family at bedside today.   Blood pressure elevated to 180s in last 24 hours.  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 -Urine culture sent on admission grew  only 10,000 CFU per mL of Enterococcus faecalis. -Blood cultures on admission did not show any growth -Patient had lumbar puncture on 12/15 and CSF was sent for culture.  It showed predominantly mononuclear WBC but no organisms-not convincing for bacterial meningitis -Currently on ampicillin IV -Currently has Foley catheter.  Per PCCM, this was discussed with urology.  Bilateral multifocal stroke -Per neurology note, patient had multifocal infarcts including left PCA, right cerebellum, bilateral frontal and right thalamus.  Etiology likely embolic from endocarditis versus cardiomyopathy with low EF -MRI did not show large vessel occlusion but 50% stenosis of left PCA -Carotid duplex with nonsignificant stenosis of bilateral carotids -Echo with EF 20% and no LV thrombus -A1c 6.4, LDL 141 -Currently patient is unable to follow any commands, has NG tube for feeding. -Prior to admission, patient was not on any antiplatelet/anticoagulant Per neurology recommendation, patient is currently on aspirin 325 mg.  Pending TEE (scheduled for 12/20), if negative for endocarditis, neurology recommends DOAC.  Once EF improves to more than 30%, can switch DOAC back to antiplatelet. -Continue Lipitor 80 mg daily. -I do not see a good prognosis.  We will call palliative care consultation.  Dysphagia -Per speech therapy note from 12/16, patient open eyes and passed speech therapy evaluation.  Regular thin liquid diet was recommended.  Her mental status seems worse today.  She still has edema which I will continue at this time.  Mental status does not improve, will need to talk to family about PEG tube  placement.    Acute metabolic encephalopathy  -multifactorial -bilateral multifocal stroke, polypharmacy at home, AKI with rhabdo, emphysematous cystitis, possible low flow state.  -Ammonia WNL, UDS neg, not hypercapnic on ABG, no evidence of sz on EEG  -LP 12/15 -- not convincing for bacterial meningitis   -Continue delirium precautions -Sedatives is on hold.   Acute on chronic biventricular heart failure  Essential hypertension -Echo with a EF less than 20%, global hypokinesis, no LV thrombus, elevated RV systolic pressure to 66. -Was on Cleviprex in ICU.  Weaned down. -Although her home med list does not show cardiac meds, discharge med list from September last year shows Coreg, Lasix, Entresto, Imdur, hydralazine.  -Yesterday, I resumed her Coreg, and Imdur, but apparently it stayed as signed and held and patient did not get them in last 24 hours.  He is to receive it today. Continue to monitor blood pressure.  AKI -Creatinine was up at 1.73 on admission.  Gradually improving, 1.08 today.   Recent Labs    07/30/21 1712 07/31/21 0344 07/31/21 1840 08/01/21 0448 08/02/21 0432 08/03/21 0501 08/04/21 0640  BUN 19 16 17  21* 15 19 16   CREATININE 1.73* 1.24* 1.60* 1.74* 1.08* 0.89 0.86   Rhabdomyolysis -On admission, CK level was elevated over 8000, probably related to prolonged immobility, AKI. -Improving.  Repeat labs.  Continue hydration with LR currently at 75 mill per hour.  Watch out for CHF decompensation. Recent Labs  Lab 07/30/21 1712 07/31/21 0344 08/02/21 0432  CKTOTAL 8,816* 5,023* 1,699*   Lactic acidosis -Lactic acid level trend as below, peaked at 5.3, normalized on repeat labs today.   Recent Labs  Lab 07/31/21 0344 07/31/21 0901 07/31/21 1303 07/31/21 1840 08/04/21 0640  LATICACIDVEN 3.4* 3.6* 5.3* 4.1* 1.7    Mild transaminitis -Unclear cause, may be related to right heart failure itself.  Improving. Recent Labs  Lab 07/30/21 1712 07/31/21 0344 07/31/21 1840 08/02/21 0432 08/04/21 0640  AST 107* 80* 73* 125* 65*  ALT 36 33 43 127* 95*  ALKPHOS 87 73 107 109 96  BILITOT 1.7* 2.0* 1.5* 0.8 0.8  PROT 8.1 7.2 6.6 7.4 6.2*  ALBUMIN 4.5 4.0 3.7 3.9 3.2*   Hypokalemia/hypophosphatemia -Potassium low at 3.1.  Replacement ordered Recent Labs  Lab  07/31/21 1840 08/01/21 0448 08/02/21 0432 08/02/21 1431 08/02/21 1749 08/03/21 0501 08/03/21 1701 08/04/21 0640  K 3.3* 3.6 3.0*  --   --  3.2*  --  3.1*  MG  --  1.9  --  2.5* 2.4 2.3 2.2  --   PHOS  --   --   --  2.7 2.7 2.1* 3.1  --    Bipolar depression Chronic Pain  -No meds on hold   Abdominal Mass  -Incidental finding of 4.7 cm soft tissue mass on CT renal study done on admission. -Once her mental status and overall prognosis improves, we can consider obtaining CT abdomen pelvis with IV and oral contrast.  At this time, it would not change the course of her illness.  Mobility: PT eval Living condition: Was living at home with husband Goals of care:   Code Status: Full Code  Nutritional status: Body mass index is 35.26 kg/m.  Nutrition Problem: Inadequate oral intake Etiology: acute illness Signs/Symptoms: meal completion < 25% Diet:  Diet Order             Diet Heart Room service appropriate? Yes; Fluid consistency: Thin  Diet effective now  DVT prophylaxis:  enoxaparin (LOVENOX) injection 40 mg Start: 08/02/21 1000 Place and maintain sequential compression device Start: 08/01/21 1458   Antimicrobials: IV ampicillin Fluid: LR@75  Consultants: Neurology Family Communication: Family not at bedside today  Status is: Inpatient  Continue in-hospital care because: Continues to need antibiotics, pending PT eval.  Continues tube feeding.  Not waking up really Level of care: Progressive   Dispo: The patient is from: Home              Anticipated d/c is to: Unclear at this time              Patient currently is not medically stable to d/c.   Difficult to place patient No     Infusions:   ampicillin (OMNIPEN) IV 2 g (08/04/21 0530)   feeding supplement (OSMOLITE 1.5 CAL) 1,000 mL (08/03/21 2020)   lactated ringers 75 mL/hr at 08/04/21 0336   potassium chloride      Scheduled Meds:  amantadine  100 mg Per Tube BID   amLODipine  10 mg  Per Tube Daily   aspirin  300 mg Rectal Daily   Or   aspirin  325 mg Per Tube Daily   atorvastatin  80 mg Per Tube Daily   carvedilol  3.125 mg Per Tube BID WC   chlorhexidine  15 mL Mouth Rinse BID   Chlorhexidine Gluconate Cloth  6 each Topical Daily   enoxaparin (LOVENOX) injection  40 mg Subcutaneous Q24H   feeding supplement (PROSource TF)  90 mL Per Tube BID   isosorbide dinitrate  10 mg Per Tube TID   mouth rinse  15 mL Mouth Rinse q12n4p    PRN meds: acetaminophen **OR** acetaminophen (TYLENOL) oral liquid 160 mg/5 mL **OR** acetaminophen, hydrALAZINE, labetalol, ondansetron (ZOFRAN) IV   Antimicrobials: Anti-infectives (From admission, onward)    Start     Dose/Rate Route Frequency Ordered Stop   08/02/21 1000  vancomycin (VANCOREADY) IVPB 1250 mg/250 mL  Status:  Discontinued        1,250 mg 166.7 mL/hr over 90 Minutes Intravenous Every 24 hours 08/01/21 1008 08/02/21 1002   08/01/21 1115  ampicillin (OMNIPEN) 2 g in sodium chloride 0.9 % 100 mL IVPB        2 g 300 mL/hr over 20 Minutes Intravenous Every 6 hours 08/01/21 1023     08/01/21 1100  cefTRIAXone (ROCEPHIN) 2 g in sodium chloride 0.9 % 100 mL IVPB  Status:  Discontinued        2 g 200 mL/hr over 30 Minutes Intravenous Every 12 hours 08/01/21 1000 08/02/21 1002   08/01/21 1100  vancomycin (VANCOREADY) IVPB 2000 mg/400 mL        2,000 mg 200 mL/hr over 120 Minutes Intravenous  Once 08/01/21 1007 08/01/21 1349   08/01/21 0800  cefTRIAXone (ROCEPHIN) 2 g in sodium chloride 0.9 % 100 mL IVPB  Status:  Discontinued        2 g 200 mL/hr over 30 Minutes Intravenous Every 24 hours 07/31/21 1354 08/01/21 1000   07/31/21 0800  cefTRIAXone (ROCEPHIN) 1 g in sodium chloride 0.9 % 100 mL IVPB  Status:  Discontinued        1 g 200 mL/hr over 30 Minutes Intravenous Every 24 hours 07/30/21 2349 07/31/21 1354   07/30/21 1915  ceFEPIme (MAXIPIME) 2 g in sodium chloride 0.9 % 100 mL IVPB        2 g 200 mL/hr over 30 Minutes  Intravenous  Once 07/30/21 1907  07/30/21 2020   07/30/21 1900  vancomycin (VANCOCIN) IVPB 1000 mg/200 mL premix        1,000 mg 200 mL/hr over 60 Minutes Intravenous  Once 07/30/21 1847 07/30/21 2133       Objective: Vitals:   08/04/21 0528 08/04/21 0755  BP: (!) 183/105 (!) 189/115  Pulse:  81  Resp:  15  Temp:  97.6 F (36.4 C)  SpO2:  100%    Intake/Output Summary (Last 24 hours) at 08/04/2021 0903 Last data filed at 08/04/2021 0530 Gross per 24 hour  Intake 1285.42 ml  Output 1600 ml  Net -314.58 ml   Filed Weights   07/31/21 0722 08/03/21 0500 08/04/21 0500  Weight: 102 kg 100.5 kg 99.1 kg   Weight change: -1.4 kg Body mass index is 35.26 kg/m.   Physical Exam: General exam: Middle-aged African-American female.  Spontaneous movements seen but unable to follow commands.  Does not seem to be in pain Skin: No rashes, lesions or ulcers. HEENT: Atraumatic, normocephalic, no obvious bleeding.  Has NG tube in place Lungs: Clear to auscultation bilaterally CVS: Regular rate and rhythm, no murmur GI/Abd soft, nontender, nondistended, bowel sound present CNS: Tries to open eyes on verbal command.  Unable to follow any other commands.  Spontaneous movement seen Psychiatry: Sad affect Extremities: No pedal edema, no calf tenderness  Data Review: I have personally reviewed the laboratory data and studies available.  F/u labs ordered Unresulted Labs (From admission, onward)     Start     Ordered   08/05/21 0500  CK  Daily,   R     Question:  Specimen collection method  Answer:  Lab=Lab collect   08/04/21 0902   08/04/21 0903  CK  Add-on,   AD       Question:  Specimen collection method  Answer:  Lab=Lab collect   08/04/21 0902   08/04/21 0500  CBC with Differential/Platelet  Daily,   R     Question:  Specimen collection method  Answer:  Lab=Lab collect   08/03/21 1349   08/04/21 6270  Basic metabolic panel  Daily,   R     Question:  Specimen collection method   Answer:  Lab=Lab collect   08/03/21 1349   08/01/21 1218  HSV 1/2 PCR, CSF Cerebrospinal Fluid  Once,   R        08/01/21 1218            Signed, Terrilee Croak, MD [facility] 08/04/2021

## 2021-08-05 ENCOUNTER — Inpatient Hospital Stay (HOSPITAL_COMMUNITY): Payer: Medicare HMO

## 2021-08-05 LAB — CBC WITH DIFFERENTIAL/PLATELET
Abs Immature Granulocytes: 0.06 10*3/uL (ref 0.00–0.07)
Basophils Absolute: 0 10*3/uL (ref 0.0–0.1)
Basophils Relative: 0 %
Eosinophils Absolute: 0.8 10*3/uL — ABNORMAL HIGH (ref 0.0–0.5)
Eosinophils Relative: 7 %
HCT: 44.5 % (ref 36.0–46.0)
Hemoglobin: 15.1 g/dL — ABNORMAL HIGH (ref 12.0–15.0)
Immature Granulocytes: 1 %
Lymphocytes Relative: 14 %
Lymphs Abs: 1.7 10*3/uL (ref 0.7–4.0)
MCH: 31.9 pg (ref 26.0–34.0)
MCHC: 33.9 g/dL (ref 30.0–36.0)
MCV: 94.1 fL (ref 80.0–100.0)
Monocytes Absolute: 0.9 10*3/uL (ref 0.1–1.0)
Monocytes Relative: 7 %
Neutro Abs: 8.5 10*3/uL — ABNORMAL HIGH (ref 1.7–7.7)
Neutrophils Relative %: 71 %
Platelets: 320 10*3/uL (ref 150–400)
RBC: 4.73 MIL/uL (ref 3.87–5.11)
RDW: 13.1 % (ref 11.5–15.5)
WBC: 12 10*3/uL — ABNORMAL HIGH (ref 4.0–10.5)
nRBC: 0 % (ref 0.0–0.2)

## 2021-08-05 LAB — BASIC METABOLIC PANEL
Anion gap: 9 (ref 5–15)
BUN: 20 mg/dL (ref 6–20)
CO2: 30 mmol/L (ref 22–32)
Calcium: 9.2 mg/dL (ref 8.9–10.3)
Chloride: 103 mmol/L (ref 98–111)
Creatinine, Ser: 0.9 mg/dL (ref 0.44–1.00)
GFR, Estimated: >60 mL/min (ref >60)
Glucose, Bld: 155 mg/dL — ABNORMAL HIGH (ref 70–99)
Potassium: 3 mmol/L — ABNORMAL LOW (ref 3.5–5.1)
Sodium: 142 mmol/L (ref 135–145)

## 2021-08-05 LAB — GLUCOSE, CAPILLARY
Glucose-Capillary: 114 mg/dL — ABNORMAL HIGH (ref 70–99)
Glucose-Capillary: 152 mg/dL — ABNORMAL HIGH (ref 70–99)
Glucose-Capillary: 152 mg/dL — ABNORMAL HIGH (ref 70–99)
Glucose-Capillary: 164 mg/dL — ABNORMAL HIGH (ref 70–99)
Glucose-Capillary: 172 mg/dL — ABNORMAL HIGH (ref 70–99)
Glucose-Capillary: 176 mg/dL — ABNORMAL HIGH (ref 70–99)

## 2021-08-05 LAB — HSV 1/2 PCR, CSF
HSV-1 DNA: NEGATIVE
HSV-2 DNA: NEGATIVE

## 2021-08-05 LAB — CK: Total CK: 250 U/L — ABNORMAL HIGH (ref 38–234)

## 2021-08-05 MED ORDER — POTASSIUM CHLORIDE 10 MEQ/100ML IV SOLN
10.0000 meq | INTRAVENOUS | Status: AC
  Administered 2021-08-05 (×2): 10 meq via INTRAVENOUS
  Filled 2021-08-05 (×2): qty 100

## 2021-08-05 MED ORDER — CARVEDILOL 6.25 MG PO TABS
6.2500 mg | ORAL_TABLET | Freq: Two times a day (BID) | ORAL | Status: DC
  Administered 2021-08-05 – 2021-08-14 (×17): 6.25 mg
  Filled 2021-08-05 (×17): qty 1

## 2021-08-05 MED ORDER — AMANTADINE HCL 50 MG/5ML PO SOLN
150.0000 mg | Freq: Two times a day (BID) | ORAL | Status: DC
  Administered 2021-08-05 – 2021-08-07 (×4): 150 mg
  Filled 2021-08-05 (×6): qty 15

## 2021-08-05 NOTE — Procedures (Signed)
[name]  MRN: [id number]  Epilepsy Attending: Lora Havens  Referring Physician/Provider: Dr Antony Contras Date: 08/05/2021 Duration: 32.48 mins  Patient history: 55yo M with ams. EEG to evaluate for seizure.  Level of alertness: Awake  AEDs during EEG study: None  Technical aspects: This EEG study was done with scalp electrodes positioned according to the 10-20 International system of electrode placement. Electrical activity was acquired at a sampling rate of 500Hz  and reviewed with a high frequency filter of 70Hz  and a low frequency filter of 1Hz . EEG data were recorded continuously and digitally stored.   Description: EEG showed continuous generalized 3 to 6 Hz theta-delta slowing. Hyperventilation and photic stimulation were not performed.     ABNORMALITY - Continuous slow, generalized  IMPRESSION: This study is suggestive of moderate diffuse encephalopathy, nonspecific etiology. No seizures or epileptiform discharges were seen throughout the recording.  [name] Barbra Sarks

## 2021-08-05 NOTE — Progress Notes (Signed)
Physical Therapy Treatment Patient Details Name: Dorothy Alvarez MRN: [id number] DOB: August 18, 1966 Today's Date: 08/05/2021   History of Present Illness 55 y/o female presented to [facility] ED on 12/13 for lethargy and confusion x 2 weeks. Found to have AKI, rhabdo, and emphysematous cystitis. MRI showed acute L PCA infarct, additional punctate areas of restricted diffusion in bilateral cerebral, R cerebellar, and medial R thalamus, and encephalomalacia in medial R occipital lobe, R posterior hippocampus, and L frontoparietal region. PMH: HTN, chronic pain syndrome, bipolar disorder, anxiety    PT Comments    Pt lethargic throughout session, with eyes closed and responding only to noxious stimuli. Pt followed 1 command of "open your hand" when reapplying mits at end of session. Daughter present and asking if pt wanted to see grandson (picture) and pt nodded head "yes". No other commands followed or interactions noted. Family reports pt has been interactive with music. Music turned on for session however no interaction noted. Will continue to follow and progress as able per POC.   Recommendations for follow up therapy are one component of a multi-disciplinary discharge planning process, led by the attending physician.  Recommendations may be updated based on patient status, additional functional criteria and insurance authorization.  Follow Up Recommendations  Skilled nursing-short term rehab (<3 hours/day)     Assistance Recommended at Discharge Frequent or constant Supervision/Assistance  Equipment Recommendations  Other (comment) (TBD)    Recommendations for Other Services       Precautions / Restrictions Precautions Precautions: Fall Precaution Comments: cortrak, foley Restrictions Weight Bearing Restrictions: No     Mobility  Bed Mobility               General bed mobility comments: Unable    Transfers                   General transfer comment: Unable     Ambulation/Gait               General Gait Details: Unable   Stairs             Wheelchair Mobility    Modified Rankin (Stroke Patients Only) Modified Rankin (Stroke Patients Only) Pre-Morbid Rankin Score: No symptoms Modified Rankin: Severe disability     Balance Overall balance assessment: Needs assistance   Sitting balance-Leahy Scale: Fair Sitting balance - Comments: able to sit unsupported EOB                                    Cognition Arousal/Alertness: Lethargic Behavior During Therapy: Flat affect Overall Cognitive Status: Difficult to assess                                 General Comments: patient resistive to eye opening. Not following commands but moving spontaneously.        Exercises Low Level/ICU Exercises Hip ABduction/ADduction: 10 reps Heel Slides: 10 reps Shoulder Flexion: 10 reps Elbow Flexion: 10 reps Other Exercises Other Exercises: gastroc stretch 3x20"    General Comments General comments (skin integrity, edema, etc.): Daughter, husband, and parents present. Family educated on positioning recommendations, pillow placement under seemingly weaker R side, and performing PROM.      Pertinent Vitals/Pain Pain Assessment: Faces Faces Pain Scale: Hurts a little bit (to noxious stimuli) Pain Descriptors / Indicators: Grimacing Pain Intervention(s): Limited activity within patient's tolerance;Monitored during  session;Repositioned    Home Living Family/patient expects to be discharged to:: Private residence Living Arrangements: Spouse/significant other;Children Available Help at Discharge: Available 24 hours/day (try to be) Type of Home: House Home Access: Stairs to enter Entrance Stairs-Rails: Psychiatric nurse of Steps: 5   Home Layout: One level Home Equipment: None      Prior Function            PT Goals (current goals can now be found in the care plan section) Acute  Rehab PT Goals Patient Stated Goal: Unable to state this session PT Goal Formulation: With family Time For Goal Achievement: 08/16/21 Potential to Achieve Goals: Fair Progress towards PT goals: Progressing toward goals    Frequency    Min 3X/week      PT Plan Current plan remains appropriate    Co-evaluation              AM-PAC PT "6 Clicks" Mobility   Outcome Measure  Help needed turning from your back to your side while in a flat bed without using bedrails?: Total Help needed moving from lying on your back to sitting on the side of a flat bed without using bedrails?: Total Help needed moving to and from a bed to a chair (including a wheelchair)?: Total Help needed standing up from a chair using your arms (e.g., wheelchair or bedside chair)?: Total Help needed to walk in hospital room?: Total Help needed climbing 3-5 steps with a railing? : Total 6 Click Score: 6    End of Session   Activity Tolerance: Patient tolerated treatment well Patient left: in bed;with call bell/phone within reach;with bed alarm set;with family/visitor present Nurse Communication: Mobility status PT Visit Diagnosis: Muscle weakness (generalized) (M62.81);Unsteadiness on feet (R26.81);Other abnormalities of gait and mobility (R26.89);Other symptoms and signs involving the nervous system (R29.898)     Time: 1791-5056 PT Time Calculation (min) (ACUTE ONLY): 22 min  Charges:  $Therapeutic Exercise: 8-22 mins                     Dorothy Alvarez, PT, DPT Acute Rehabilitation Services Pager: [phone] Office: [phone]    Dorothy Alvarez 08/05/2021, 3:54 PM

## 2021-08-05 NOTE — Progress Notes (Signed)
PROGRESS NOTE  Dorothy Alvarez  DOB: Jul 08, 1966  PCP: Nolene Ebbs, MD NGE:[id number]  DOA: 07/30/2021  LOS: 6 days  Hospital Day: 7  Chief Complaint  Patient presents with   lethargic   Emesis   Hypertension          Brief narrative: Dorothy Alvarez is a 55 y.o. female with PMH significant for HTN, chronic pain syndrome, bipolar disorder/depression  Patient was brought to the ED at [facility] on 12/13 with complaint of increasing somnolence, lethargy for last several days.  In the ER, she was noted to be febrile.  Labs showed elevated WBC count, AKI, rhabdomyolysis, lactic acidosis. CT renal study showed emphysematous cystitis and a 4.7 cm rounded soft tissue structure between the gastric fundus and left upper pole of kidney. 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   MRI of the brain showed a diffusion defect concerning for acute left PCA territory infarct.   Patient was started on IV fluid, IV antibiotics. Patient was admitted to hospitalist service, transferred to [facility] for neurology consult. On arrival to [facility], patient was noted to be critical and transferred to ICU service. Further work-up showed echo with a low LVEF of ~ 20%, LV with global hypokinesis, RV systolic function moderately reduced with RVSP ~65, LA mildly dilated, moderate to severe TVR.  With clinical improvement, patient was transferred out of ICU service to [facility] on 12/17.  Subjective: Patient was seen and examined this morning.  No neurological change in status from yesterday.  Patient's mother at bedside. Palliative care consultation from yesterday noted.  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 -Urine culture sent on admission grew only 10,000 CFU per mL of Enterococcus faecalis. -Currently on ampicillin IV, plan for 7-day  course. -Currently has Foley catheter.  Per PCCM, this was discussed with urology.  Bilateral multifocal stroke -Per neurology note, patient had multifocal infarcts including left PCA, right cerebellum, bilateral frontal and right thalamus.  Etiology likely embolic from endocarditis versus cardiomyopathy with low EF -MRI did not show large vessel occlusion but 50% stenosis of left PCA -Carotid duplex with nonsignificant stenosis of bilateral carotids -Echo with EF 20% and no LV thrombus -A1c 6.4, LDL 141 -Currently patient is unable to follow any commands, has NG tube for feeding. -Prior to admission, patient was not on any antiplatelet/anticoagulant Per neurology recommendation, patient is currently on aspirin 325 mg.  Pending TEE (scheduled for 12/20), if negative for endocarditis, neurology recommends DOAC.  Once EF improves to more than 30%, can switch DOAC back to antiplatelet. -Continue Lipitor 80 mg daily.  Dysphagia -Per speech therapy note from 12/16, patient opened eyes and passed speech therapy evaluation.  Regular thin liquid diet was recommended.   -Her mental status however since then has not improved.  She has barely been able to open eyes on command.  Spontaneous movement seen but unable to follow command to eat.  Currently she has ongoing NG tube feeding. -If the course takes long, she needs a plan for PEG tube placement.  Acute metabolic encephalopathy  -multifactorial -bilateral multifocal stroke, polypharmacy at home, AKI with rhabdo, emphysematous cystitis, possible low flow state.  -Ammonia WNL, UDS neg, not hypercapnic on ABG, no evidence of seizure on EEG  -lumbar puncture on 12/15 and CSF was sent for culture.  It showed predominantly mononuclear WBC but  no organisms-not convincing for bacterial meningitis -Continue delirium precautions -Sedatives is on hold.   Acute on chronic biventricular heart failure  Essential hypertension -Echo with a EF less than 20%,  global hypokinesis, no LV thrombus, elevated RV systolic pressure to 66. -Was on Cleviprex in ICU.  Weaned down. -Although her home med list does not show cardiac meds, discharge med list from September last year shows Coreg, Lasix, Entresto, Imdur, hydralazine.  -12/18, started on Coreg, and Imdur.  Blood pressure is still elevated to over 140s.  I will increase the dose of Coreg today.  Continue to monitor.    AKI -Creatinine was up at 1.73 on admission.  Improved with hydration.  Rhabdomyolysis -On admission, CK level was elevated over 8000, probably related to prolonged immobility, AKI. -Improved with hydration.  Patient is already getting tube feeding.  I would stop IV hydration to reduce the risk of CHF flareup.  Lactic acidosis -Lactic acid level trend as below, peaked at 5.3.  Gradually trended down and normalized.  Mild transaminitis -Unclear cause, may be related to right heart failure itself.  Improving. Recent Labs  Lab 07/30/21 1712 07/31/21 0344 07/31/21 1840 08/02/21 0432 08/04/21 0640  AST 107* 80* 73* 125* 65*  ALT 36 33 43 127* 95*  ALKPHOS 87 73 107 109 96  BILITOT 1.7* 2.0* 1.5* 0.8 0.8  PROT 8.1 7.2 6.6 7.4 6.2*  ALBUMIN 4.5 4.0 3.7 3.9 3.2*   Hypokalemia/hypophosphatemia -Potassium low at 3.  Replacement given Recent Labs  Lab 08/01/21 0448 08/02/21 0432 08/02/21 1431 08/02/21 1749 08/03/21 0501 08/03/21 1701 08/04/21 0640 08/05/21 0322  K 3.6 3.0*  --   --  3.2*  --  3.1* 3.0*  MG 1.9  --  2.5* 2.4 2.3 2.2  --   --   PHOS  --   --  2.7 2.7 2.1* 3.1  --   --    Bipolar depression Chronic Pain  -No meds on hold   Abdominal Mass  -Incidental finding of 4.7 cm soft tissue mass on CT renal study done on admission. -Once her mental status and overall prognosis improves, we can consider obtaining CT abdomen pelvis with IV and oral contrast.  At this time, it would not change the course of her illness.  Goals of care -Patient is not showing good  neurological recovery. Discussed with family.  Palliative care consulted on 12/18.  Remains full code at this time.  Mobility: PT eval Living condition: Was living at home with husband Nutritional status: Body mass index is 35.26 kg/m.  Nutrition Problem: Inadequate oral intake Etiology: acute illness Signs/Symptoms: meal completion < 25% Diet:  Diet Order             Diet Heart Room service appropriate? Yes; Fluid consistency: Thin  Diet effective now                  DVT prophylaxis:  enoxaparin (LOVENOX) injection 40 mg Start: 08/02/21 1000 Place and maintain sequential compression device Start: 08/01/21 1458   Antimicrobials: IV ampicillin Fluid: Hold IV fluid Consultants: Neurology Family Communication: Patient's mother at bedside.  Status is: Inpatient  Continue in-hospital care because: Continues to need antibiotics, pending PT eval.  Continues tube feeding.  Not waking up really Level of care: Progressive   Dispo: The patient is from: Home              Anticipated d/c is to: Unclear at this time  Patient currently is not medically stable to d/c.   Difficult to place patient No     Infusions:   ampicillin (OMNIPEN) IV 2 g (08/05/21 0521)   feeding supplement (OSMOLITE 1.5 CAL) 1,000 mL (08/04/21 1742)   potassium chloride 10 mEq (08/05/21 1118)    Scheduled Meds:  amantadine  100 mg Per Tube BID   amLODipine  10 mg Per Tube Daily   aspirin  300 mg Rectal Daily   Or   aspirin  325 mg Per Tube Daily   atorvastatin  80 mg Per Tube Daily   carvedilol  6.25 mg Per Tube BID WC   chlorhexidine  15 mL Mouth Rinse BID   Chlorhexidine Gluconate Cloth  6 each Topical Daily   enoxaparin (LOVENOX) injection  40 mg Subcutaneous Q24H   feeding supplement (PROSource TF)  90 mL Per Tube BID   isosorbide dinitrate  10 mg Per Tube TID   mouth rinse  15 mL Mouth Rinse q12n4p    PRN meds: acetaminophen **OR** acetaminophen (TYLENOL) oral liquid 160  mg/5 mL **OR** acetaminophen, hydrALAZINE, labetalol, ondansetron (ZOFRAN) IV   Antimicrobials: Anti-infectives (From admission, onward)    Start     Dose/Rate Route Frequency Ordered Stop   08/02/21 1000  vancomycin (VANCOREADY) IVPB 1250 mg/250 mL  Status:  Discontinued        1,250 mg 166.7 mL/hr over 90 Minutes Intravenous Every 24 hours 08/01/21 1008 08/02/21 1002   08/01/21 1115  ampicillin (OMNIPEN) 2 g in sodium chloride 0.9 % 100 mL IVPB        2 g 300 mL/hr over 20 Minutes Intravenous Every 6 hours 08/01/21 1023     08/01/21 1100  cefTRIAXone (ROCEPHIN) 2 g in sodium chloride 0.9 % 100 mL IVPB  Status:  Discontinued        2 g 200 mL/hr over 30 Minutes Intravenous Every 12 hours 08/01/21 1000 08/02/21 1002   08/01/21 1100  vancomycin (VANCOREADY) IVPB 2000 mg/400 mL        2,000 mg 200 mL/hr over 120 Minutes Intravenous  Once 08/01/21 1007 08/01/21 1349   08/01/21 0800  cefTRIAXone (ROCEPHIN) 2 g in sodium chloride 0.9 % 100 mL IVPB  Status:  Discontinued        2 g 200 mL/hr over 30 Minutes Intravenous Every 24 hours 07/31/21 1354 08/01/21 1000   07/31/21 0800  cefTRIAXone (ROCEPHIN) 1 g in sodium chloride 0.9 % 100 mL IVPB  Status:  Discontinued        1 g 200 mL/hr over 30 Minutes Intravenous Every 24 hours 07/30/21 2349 07/31/21 1354   07/30/21 1915  ceFEPIme (MAXIPIME) 2 g in sodium chloride 0.9 % 100 mL IVPB        2 g 200 mL/hr over 30 Minutes Intravenous  Once 07/30/21 1907 07/30/21 2020   07/30/21 1900  vancomycin (VANCOCIN) IVPB 1000 mg/200 mL premix        1,000 mg 200 mL/hr over 60 Minutes Intravenous  Once 07/30/21 1847 07/30/21 2133       Objective: Vitals:   08/05/21 0600 08/05/21 0817  BP: (!) 144/85 (!) 148/80  Pulse: 89 90  Resp:  18  Temp:  98 F (36.7 C)  SpO2:  100%    Intake/Output Summary (Last 24 hours) at 08/05/2021 1134 Last data filed at 08/05/2021 0521 Gross per 24 hour  Intake 1392.08 ml  Output 2150 ml  Net -757.92 ml   Filed  Weights   07/31/21  6811 08/03/21 0500 08/04/21 0500  Weight: 102 kg 100.5 kg 99.1 kg   Weight change:  Body mass index is 35.26 kg/m.   Physical Exam: General exam: Middle-aged African-American female. spontaneous movements seen but unable to follow commands.  Does not seem to be in pain Skin: No rashes, lesions or ulcers. HEENT: Atraumatic, normocephalic, no obvious bleeding.  Has NG tube in place Lungs: Clear to auscultation bilaterally CVS: Regular rate and rhythm, no murmur GI/Abd soft, nontender, nondistended, bowel sound present CNS: Tries to open eyes on verbal command.  Unable to follow any other commands.  Spontaneous movement seen Psychiatry: Sad affect Extremities: No pedal edema, no calf tenderness  Data Review: I have personally reviewed the laboratory data and studies available.  F/u labs ordered Unresulted Labs (From admission, onward)     Start     Ordered   08/05/21 0500  CK  Daily,   R     Question:  Specimen collection method  Answer:  Lab=Lab collect   08/04/21 0902   08/04/21 0500  CBC with Differential/Platelet  Daily,   R     Question:  Specimen collection method  Answer:  Lab=Lab collect   08/03/21 1349   08/04/21 5726  Basic metabolic panel  Daily,   R     Question:  Specimen collection method  Answer:  Lab=Lab collect   08/03/21 1349   08/01/21 1218  HSV 1/2 PCR, CSF Cerebrospinal Fluid  Once,   R        08/01/21 1218            Signed, Terrilee Croak, MD [facility] 08/05/2021

## 2021-08-05 NOTE — Progress Notes (Signed)
This chaplain responded to PMT consult for family support and prayer.  The Pt. mother-Ann is at the bedside and the Pt. daughter-Latoya joins [place] in the middle of the visit.  The chaplain listens reflectively as Lelon Frohlich discusses the care she is willing to provide for the Pt. and her experience. The chaplain understands healing is the focus of family prayer along with immersing the Pt. in scripture. Lelon Frohlich shares there is clergy in the family.   The chaplain accepts the opportunity for intercessory prayer and F/U spiritual care as needed.

## 2021-08-05 NOTE — Progress Notes (Signed)
EEG complete - results pending 

## 2021-08-05 NOTE — Plan of Care (Signed)

## 2021-08-05 NOTE — Care Management Important Message (Signed)
Important Message  Patient Details  Name: Dorothy Alvarez MRN: [id number] Date of Birth: [birth year]   Medicare Important Message Given:  Yes     Hopelynn Gartland 08/05/2021, 3:03 PM

## 2021-08-05 NOTE — Progress Notes (Signed)
° ° °  [facility] has been requested to perform a transesophageal echocardiogram on 08/06/2021 for stroke.  After careful review of history and examination, the risks and benefits of transesophageal echocardiogram have been explained including risks of esophageal damage, perforation (1:10,000 risk), bleeding, pharyngeal hematoma as well as other potential complications associated with conscious sedation including aspiration, arrhythmia, respiratory failure and death. Alternatives to treatment were discussed, questions were answered. Patient is willing to proceed.   Patient with chronic systolic heart failure, NICM with baseline EF 25% in 2021 who has failed to follow up with cardiology as outpatient presented with increased somnolence and lethargy. CT renal showed emphysematous cystitis. CT head showed chronic R occipital infarct. MRI showed acute L PCA territory infarct. Currently receiving tube feed. BP borderline elevated. RBC and platelet normal.   Almyra Deforest, PA-C 08/05/2021 2:03 PM

## 2021-08-05 NOTE — Progress Notes (Signed)
STROKE TEAM PROGRESS NOTE       INTERVAL HISTORY Patient is seen in her room with her daughter at bedside  .  Neurological exam remains unchanged and she has not shown any response to amantadine yet.  Patient continues to be lying in bed with eyes tightly closed.  She actively resists forceful eye opening and will refuse to open them.  She tries to speak with his nonverbal.  She does not follow commands.  She is able to move all 4 extremities voluntarily but will not do so on commands.  Vital signs stable.   Labs today show that potassium is yet low at 3.  WBC count has come down to 12.  CPK is also coming down Vitals:   08/05/21 0500 08/05/21 0600 08/05/21 0817 08/05/21 1259  BP: (!) 180/116 (!) 144/85 (!) 148/80 (!) 166/115  Pulse:  89 90 95  Resp:   18 18  Temp:   98 F (36.7 C) 98 F (36.7 C)  TempSrc:   Oral Oral  SpO2:   100% 100%  Weight:      Height:       CBC:  Recent Labs  Lab 08/04/21 0640 08/05/21 0322  WBC 12.6* 12.0*  NEUTROABS 8.9* 8.5*  HGB 15.4* 15.1*  HCT 46.6* 44.5  MCV 93.4 94.1  PLT 298 811   Basic Metabolic Panel:  Recent Labs  Lab 08/03/21 0501 08/03/21 1701 08/04/21 0640 08/05/21 0322  NA 139  --  143 142  K 3.2*  --  3.1* 3.0*  CL 101  --  104 103  CO2 29  --  29 30  GLUCOSE 173*  --  174* 155*  BUN 19  --  16 20  CREATININE 0.89  --  0.86 0.90  CALCIUM 8.2*  --  9.0 9.2  MG 2.3 2.2  --   --   PHOS 2.1* 3.1  --   --    Lipid Panel:  Recent Labs  Lab 07/31/21 0344  CHOL 211*  TRIG 118  HDL 46  CHOLHDL 4.6  VLDL 24  LDLCALC 141*   HgbA1c:  Recent Labs  Lab 07/31/21 0344  HGBA1C 6.4*   Urine Drug Screen:  Recent Labs  Lab 07/30/21 2046  LABOPIA NONE DETECTED  COCAINSCRNUR NONE DETECTED  LABBENZ NONE DETECTED  AMPHETMU NONE DETECTED  THCU NONE DETECTED  LABBARB NONE DETECTED    Alcohol Level  Recent Labs  Lab 07/30/21 1712  ETH <10    IMAGING past 24 hours No results found.  PHYSICAL EXAM General:  Patient  is a obese middle-aged African-American female in no acute distress  Neuro:  Patient is lying in bed with eyes closed will moan in response to name but will not answer questions or follow commands.  Her eyes are tightly closed and she resists opening her eyes by examiner.  She does slightly open them occasionally.  She will move all four extremities spontaneously and purposefully against gravity but will not follow commands..  ASSESSMENT/PLAN [name] is a 55 y.o. female with history of anxiety, depression, HTN and chronic pain presenting with lethargy and altered mental status.  She presented to the ED at [facility] on 12/13 with progressive altered mental status and lethargy.  MRI was performed reveling and acute left PCA territory infarct, possibly embolic.  She was then transferred here for further evaluation.  EEG reveled no seizure activity.  Patient has required Cleviprex for BP control and was febrile  yesterday to 101.1.  Plan is to obtain LP to rule out infectious causes of her altered mental status.  TEE will be performed to rule out endocarditis.  Stroke: Multifocal infarcts including left PCA, right cerebellum, bilateral frontal and right thalamus likely embolic from endocarditis vs. cardiomyopathy with low EF CT head Chronic right occipital lobe infarct and degenerative changes in cervical spine. 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 Will recommend  DOAC due to low EF if TEE negative for endocarditis. Once EF > 30%, can switch DOAC back to antiplatelet.  Therapy recommendations:  SNF Disposition:  pending  CHF 04/2021 EF 25 to 30%.  Per discharge, patient discharged on Lasix 80, Entresto 49/51 twice daily, Coreg 3.125 twice daily, hydralazine 75 3 times daily and isosorbide 15 twice daily.  However, dose medication was not on patient medication list on admission, concerning for medication noncompliance. Patient also did not follow-up with cardiology, concerning for noncompliance This admission, EF 20% TEE pending 12/20 Will recommend DOAC due to low EF if TEE negative for endocarditis. Once EF > 30%, can switch DOAC back to antiplatelet.   Encephalopathy vs. Functional status Altered mental status for 1 week prior to admission Still not follow commands, nonverbal at this time Observed spontaneous open eyes, looking around, watching RN hanging IV. However, not open eyes on request and actively resisting forced eye opening  Not following commands but able to sit at EOB independently Able to swallow liquid but spitting out solids EEG moderate diffuse encephalopathy, no seizure LP performed and CSF-not consistent with CNS infection De-escalate antibiotics per primary team  UTI SIRS Afebrile for the last 48 hours Tachycardia resolved Leukocytosis WBC 18.1-22.5-23.5- > 21.5 Blood culture so far negative Urine culture ENTEROCOCCUS FAECALIS UA WBC 6-10 CSF no CNS infection TEE to rule out endocarditis -planned for 12/20 Antibiotics de-escalated to ampicillin  Hypertension Home meds:  amlodipine-benazepril 10-20 On the high end Off Cleviprex Amlodipine 10 mg restarted Keep BP <180/105, gradually normalize BP Long-term BP goal normotensive   Hyperlipidemia Home meds: none LDL 141, goal < 70 Add atorvastatin 80 mg High intensity statin to be initiated Continue statin at discharge  Abdominal soft tissue mass CT abdomen  showed 4.7 cm rounded soft tissue structure interposed between the gastric fundus and the upper pole left kidney. Given location, gastric diverticulum, pancreatic tail mass, or exophytic renal mass could give this appearance. Nonemergent follow-up CT of the abdomen with IV and oral contrast is recommended. CCM on board  Other Stroke Risk Factors Obesity, Body mass index is 35.26 kg/m., BMI >/= 30 associated with increased stroke risk, recommend weight loss, diet and exercise as appropriate   Other Active Problems Chronic pain syndrome Anxiety   Hospital day # 6  Await TEE hopefully next few days.  Increased amantadine dose to 150 mg twice daily to improve arousal.  Continue statin and aspirin for now.   Continue core track tube feeds as patient not swallowing enough.  Continue electrolyte replacement as per primary team discussed with Dr. Pietro Cassis.  Repeat CT scan of the head and EEG as patient has not shown significant neurological improvement.  Long discussion with patient and daughter and answered questions Greater than 50% time during this 25-minute visit was spent in counseling and coordination of care and discussion patient and husband and answering questions.     Antony Contras, MD  stroke Neurology 08/05/2021 2:01 PM       To contact Stroke Continuity provider, please refer to [email or web address] After hours, contact General Neurology

## 2021-08-06 ENCOUNTER — Encounter (HOSPITAL_COMMUNITY): Payer: Self-pay | Admitting: Anesthesiology

## 2021-08-06 ENCOUNTER — Encounter (HOSPITAL_COMMUNITY)
Admission: EM | Disposition: A | Discharge status: Home Health | Payer: Self-pay | Source: Home / Self Care | Attending: Internal Medicine

## 2021-08-06 ENCOUNTER — Encounter (HOSPITAL_COMMUNITY): Payer: Self-pay | Admitting: Internal Medicine

## 2021-08-06 ENCOUNTER — Other Ambulatory Visit (HOSPITAL_COMMUNITY): Payer: Medicare HMO

## 2021-08-06 ENCOUNTER — Inpatient Hospital Stay (HOSPITAL_COMMUNITY): Payer: Medicare HMO

## 2021-08-06 LAB — GLUCOSE, CAPILLARY
Glucose-Capillary: 133 mg/dL — ABNORMAL HIGH (ref 70–99)
Glucose-Capillary: 136 mg/dL — ABNORMAL HIGH (ref 70–99)
Glucose-Capillary: 150 mg/dL — ABNORMAL HIGH (ref 70–99)
Glucose-Capillary: 156 mg/dL — ABNORMAL HIGH (ref 70–99)
Glucose-Capillary: 169 mg/dL — ABNORMAL HIGH (ref 70–99)
Glucose-Capillary: 195 mg/dL — ABNORMAL HIGH (ref 70–99)
Glucose-Capillary: 208 mg/dL — ABNORMAL HIGH (ref 70–99)

## 2021-08-06 LAB — CBC WITH DIFFERENTIAL/PLATELET
Abs Immature Granulocytes: 0.05 10*3/uL (ref 0.00–0.07)
Basophils Absolute: 0 10*3/uL (ref 0.0–0.1)
Basophils Relative: 0 %
Eosinophils Absolute: 0.7 10*3/uL — ABNORMAL HIGH (ref 0.0–0.5)
Eosinophils Relative: 6 %
HCT: 43.8 % (ref 36.0–46.0)
Hemoglobin: 14.8 g/dL (ref 12.0–15.0)
Immature Granulocytes: 0 %
Lymphocytes Relative: 17 %
Lymphs Abs: 2 10*3/uL (ref 0.7–4.0)
MCH: 31.4 pg (ref 26.0–34.0)
MCHC: 33.8 g/dL (ref 30.0–36.0)
MCV: 93 fL (ref 80.0–100.0)
Monocytes Absolute: 0.9 10*3/uL (ref 0.1–1.0)
Monocytes Relative: 8 %
Neutro Abs: 8.1 10*3/uL — ABNORMAL HIGH (ref 1.7–7.7)
Neutrophils Relative %: 69 %
Platelets: 337 10*3/uL (ref 150–400)
RBC: 4.71 MIL/uL (ref 3.87–5.11)
RDW: 13.1 % (ref 11.5–15.5)
WBC: 11.8 10*3/uL — ABNORMAL HIGH (ref 4.0–10.5)
nRBC: 0 % (ref 0.0–0.2)

## 2021-08-06 LAB — BASIC METABOLIC PANEL
Anion gap: 9 (ref 5–15)
BUN: 21 mg/dL — ABNORMAL HIGH (ref 6–20)
CO2: 31 mmol/L (ref 22–32)
Calcium: 9.1 mg/dL (ref 8.9–10.3)
Chloride: 104 mmol/L (ref 98–111)
Creatinine, Ser: 0.82 mg/dL (ref 0.44–1.00)
GFR, Estimated: >60 mL/min (ref >60)
Glucose, Bld: 124 mg/dL — ABNORMAL HIGH (ref 70–99)
Potassium: 3.4 mmol/L — ABNORMAL LOW (ref 3.5–5.1)
Sodium: 144 mmol/L (ref 135–145)

## 2021-08-06 LAB — CK: Total CK: 238 U/L — ABNORMAL HIGH (ref 38–234)

## 2021-08-06 SURGERY — CANCELLED PROCEDURE

## 2021-08-06 MED ORDER — CLONIDINE HCL 0.1 MG PO TABS
0.1000 mg | ORAL_TABLET | Freq: Two times a day (BID) | ORAL | Status: DC
  Administered 2021-08-06 – 2021-08-14 (×17): 0.1 mg via ORAL
  Filled 2021-08-06 (×17): qty 1

## 2021-08-06 MED ORDER — LABETALOL HCL 5 MG/ML IV SOLN
INTRAVENOUS | Status: AC
  Filled 2021-08-06: qty 4

## 2021-08-06 MED ORDER — POTASSIUM CHLORIDE 20 MEQ PO PACK
20.0000 meq | PACK | Freq: Two times a day (BID) | ORAL | Status: DC
  Administered 2021-08-06 – 2021-08-14 (×17): 20 meq
  Filled 2021-08-06 (×17): qty 1

## 2021-08-06 NOTE — Evaluation (Signed)
Speech Language Pathology Evaluation Patient Details Name: Dorothy Alvarez MRN: [id number] DOB: 1966-07-31 Today's Date: 08/06/2021 Time: 1025-8527 SLP Time Calculation (min) (ACUTE ONLY): 23 min  Problem List:  Patient Active Problem List   Diagnosis Date Noted   Chronic combined systolic and diastolic CHF (congestive heart failure) ([place]) 07/31/2021   MDD (major depressive disorder) 07/31/2021   Acute encephalopathy 07/30/2021   Acute ischemic stroke ([place]) 07/30/2021   Rhabdomyolysis 07/30/2021   SIRS (systemic inflammatory response syndrome) ([place]) 07/30/2021   Mass of soft tissue of abdomen 07/30/2021   Acute CHF (congestive heart failure) ([place]) 05/01/2020   AKI (acute kidney injury) ([place]) 05/01/2020   BMI 37.0-37.9, adult 05/01/2020   Benzodiazepine dependence ([place]) 10/22/2018   Bipolar II disorder ([place])    MDD (major depressive disorder), recurrent episode, severe ([place]) 10/20/2018   Learning disability 06/25/2015   Illiterate 06/25/2015   Adjustment disorder with depressed mood 06/24/2014   Stress reaction causing mixed disturbance of emotion and conduct    Opiate dependence, continuous ([place]) 06/16/2014   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[place]) 06/16/2014   GAD (generalized anxiety disorder) 06/16/2014   Major depressive disorder, recurrent, severe without psychotic features ([place]) 06/16/2014   Major depressive disorder, recurrent episode, moderate ([place]) 04/13/2014   Insomnia 04/13/2014   DYSPNEA 07/05/2009   VAGINITIS 07/04/2009   OVARIAN CYST 07/04/2009   Essential hypertension 07/03/2009   CARDIOMEGALY 07/03/2009   PLEURAL EFFUSION 07/03/2009   DIVERTICULITIS, COLON 07/03/2009   PERSONAL HX COLONIC POLYPS 07/03/2009   Past Medical History:  Past Medical History:  Diagnosis Date   Allergy    Anxiety    Arthritis    Chronic abdominal pain    Constipation    Depression    Hypertension    IBS (irritable bowel syndrome)    Learning disability     Neuromuscular disorder (HCC)    Past Surgical History:  Past Surgical History:  Procedure Laterality Date   ABDOMINAL HYSTERECTOMY     GALLBLADDER SURGERY     KNEE SURGERY     RIGHT/LEFT HEART CATH AND CORONARY ANGIOGRAPHY N/A 05/04/2020   Procedure: RIGHT/LEFT HEART CATH AND CORONARY ANGIOGRAPHY;  Surgeon: Belva Crome, MD;  Location: [facility];  Service: Cardiovascular;  Laterality: N/A;   HPI:  Pt presented to the ED at [facility] on 12/13 with progressive altered mental status and lethargy.  MRI was performed reveling and acute left PCA territory infarct, possibly embolic, including punctate areas of restricted diffusion in bilateral cerebral and right cerebellar hemisphere as well as medial right thalamus, encephalomalacia in medial right occipital lobe, right posterior hippocampus and left frontoparietal region.  She was then transferred here for further evaluation.  EEG reveled no seizure activity.  Patient has required Cleviprex for BP control   Assessment / Plan / Recommendation Clinical Impression  Pt has significant difficulties with communication and cognition, although per family report she was independent PTA. She does not respond to yes/no questions or one-step commands. She shows some awareness of automatic tasks, engaging in fleeting moments with [employer]. When gospel music was played, pt spontaneously and lightly squeezed SLP's hand and moved her head subtly, but responses like this could not be duplicated or utilized in a functional way. No verbal output or spontaneous vocalizations were noted throughout the evaluation. Pt will benefit from ongoing SLP services acutely and at next level of care, where 24/7 supervision is also recommended.    SLP Assessment  SLP Recommendation/Assessment: Patient needs continued Speech  [place] [facility] SLP Visit Diagnosis: Cognitive communication deficit [phone])    Recommendations for follow up therapy are  one component of a multi-disciplinary discharge planning process, led by the attending physician.  Recommendations may be updated based on patient status, additional functional criteria and insurance authorization.    Follow Up Recommendations  Skilled nursing-short term rehab (<3 hours/day) (family wanting HH - will need 24/7 supervision if so)    Assistance Recommended at Discharge  Frequent or constant Supervision/Assistance  Functional Status Assessment Patient has had a recent decline in their functional status and demonstrates the ability to make significant improvements in function in a reasonable and predictable amount of time.  Frequency and Duration min 2x/week  2 weeks      SLP Evaluation Cognition  Overall Cognitive Status: Impaired/Different from baseline Arousal/Alertness: Awake/alert Attention: Sustained;Focused Focused Attention: Impaired Focused Attention Impairment: Verbal basic Sustained Attention: Impaired Sustained Attention Impairment: Functional basic Problem Solving: Impaired Problem Solving Impairment: Functional basic       Comprehension  Auditory Comprehension Overall Auditory Comprehension: Impaired Yes/No Questions: Impaired Basic Biographical Questions: 0-25% accurate Commands: Impaired One Step Basic Commands: 0-24% accurate    Expression Expression Primary Mode of Expression: Verbal Verbal Expression Overall Verbal Expression: Impaired Initiation: Impaired Automatic Speech:  (none) Level of Generative/Spontaneous Verbalization:  (none) Non-Verbal Means of Communication: Not applicable   Oral / Motor  Motor Speech Overall Motor Speech: Other (comment) (UTA)            Osie Bond., M.A. [facility] Pager [phone] Office [phone]  08/06/2021, 5:12 PM

## 2021-08-06 NOTE — Progress Notes (Signed)
SLP Cancellation Note  Patient Details Name: Lynzy Rawles MRN: [id number] DOB: 1965-12-10   Cancelled treatment:       Reason Eval/Treat Not Completed: Patient at procedure or test/unavailable   Maxwell Martorano, Katherene Ponto 08/06/2021, 11:19 AM

## 2021-08-06 NOTE — Consult Note (Signed)
Chief Complaint: Patient was seen in consultation today for percutaneous gastrostomy tube placement  Chief Complaint  Patient presents with   lethargic   Emesis   Hypertension          Referring Physician(s): Dahal,B  Supervising Physician: Ruthann Cancer  Patient Status: [facility]  History of Present Illness: Dorothy Alvarez is a 55 y.o. female with PMH sig for anxiety/depression/bipolar disorder, IBS, arthritis, HTN, chronic pain syndrome who was brought to the ED at [facility] on 12/13 with complaint of increasing somnolence, lethargy for last several days.In the ER, she was noted to be febrile.   Labs showed elevated WBC count, AKI, rhabdomyolysis, lactic acidosis. 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   MRI of the brain showed a diffusion defect concerning for acute left PCA territory infarct.  Patient was started on IV fluid, IV antibiotics.Patient was admitted to hospitalist service for sepsis, stroke. She was transferred to [facility] for neurology consult.On arrival to [facility], patient was noted to be critical and transferred to ICU service.Further work-up showed echo with a low LVEF of ~ 20%, LV with global hypokinesis, RV systolic function moderately reduced with RVSP ~65, LA mildly dilated, moderate to severe TVR. With clinical improvement, patient was transferred out of ICU service to [facility] on 12/17. Patient's mental status has not improved for the last several days and remains comatose. She will complete antbx tx on 12/22. She has dysphagia secondary to stroke and shows no signs of neurological improvement yet. Pt currently has NG tube in place. Request now received from [facility] for perc G tube placement. Pt is still full code. She is on  ASA/lovenox.   Past Medical History:  Diagnosis Date   Allergy    Anxiety    Arthritis    Chronic abdominal pain    Constipation    Depression    Hypertension    IBS (irritable bowel syndrome)    Learning disability    Neuromuscular disorder (HCC)     Past Surgical History:  Procedure Laterality Date   ABDOMINAL HYSTERECTOMY     GALLBLADDER SURGERY     KNEE SURGERY     RIGHT/LEFT HEART CATH AND CORONARY ANGIOGRAPHY N/A 05/04/2020   Procedure: RIGHT/LEFT HEART CATH AND CORONARY ANGIOGRAPHY;  Surgeon: Belva Crome, MD;  Location: [place] [facility];  Service: Cardiovascular;  Laterality: N/A;    Allergies: Ketorolac, Morphine and related, Nsaids, and Trazodone and nefazodone  Medications: Prior to Admission medications   Medication Sig Start Date End Date Taking? Authorizing Provider  amLODipine-benazepril (LOTREL) 10-20 MG capsule Take 1 capsule by mouth daily. 05/18/21   [provider]  furosemide (LASIX) 80 MG tablet Take 1 tablet (80 mg total) by mouth daily. 05/04/20 07/31/22  Florencia Reasons, MD  gabapentin (NEURONTIN) 800 MG tablet Take 800 mg by mouth 3 (three) times daily. 06/26/21   [provider]  oxyCODONE (ROXICODONE) 15 MG immediate release tablet Take 15 mg by mouth 5 (five) times daily as needed. 07/20/21   [provider]  QUEtiapine (SEROQUEL) 300 MG tablet Take 600 mg by mouth at bedtime. 03/17/20   [provider]  tiZANidine (ZANAFLEX) 4 MG tablet Take 4 mg by mouth See admin instructions. Take 6mg  (1.5 tablets) by mouth every  6 hours as needed for muscle spasms 07/22/21   [provider]     Family History  Problem Relation Age of Onset   Diabetes Mother    Suicidality Mother    Heart disease Father    Depression Father     Social History   Socioeconomic History   Marital status: Legally Separated    Spouse name: Not on file   Number of children: 2   Years of education: Not on file   Highest education level:  Not on file  Occupational History   Occupation: DISABLED    Employer: UNEMPLOYED  Tobacco Use   Smoking status: Never   Smokeless tobacco: Never  Vaping Use   Vaping Use: Never used  Substance and Sexual Activity   Alcohol use: No   Drug use: Yes    Types: Other-see comments, Marijuana    Comment: opiates   Sexual activity: Not Currently    Birth control/protection: Surgical  Other Topics Concern   Not on file  Social History Narrative   Not on file   Social Determinants of Health   Financial Resource Strain: Not on file  Food Insecurity: Not on file  Transportation Needs: Not on file  Physical Activity: Not on file  Stress: Not on file  Social Connections: Not on file      Review of Systems unable to obtain due to pt's poor mental status  Vital Signs: BP (!) 194/112 (BP Location: Left Arm)    Pulse 82    Temp 99.3 F (37.4 C) (Oral)    Resp 18    Ht 5\' 6"  (1.676 m)    Wt 223 lb 12.3 oz (101.5 kg)    SpO2 100%    BMI 36.12 kg/m   Physical Exam pt with eyes closed; not FC; does not appear to be in distress; has some intermittent spont movements; NG in place; chest- sl dim BS bases; heart- RRR; abd- soft,+BS,NT; no LE edema  Imaging: DG Chest 2 View  Result Date: 07/30/2021 CLINICAL DATA:  Altered level of con gaseousness, sleepiness, lethargy, tachycardia, intermittent nausea and vomiting for 2 weeks, history hypertension EXAM: CHEST - 2 VIEW COMPARISON:  05/01/2020 FINDINGS: Enlargement of cardiac silhouette with pulmonary vascular congestion. Mediastinal contours normal. Lungs clear. No acute infiltrate, pleural effusion, or pneumothorax. No focal osseous abnormalities. IMPRESSION: Enlargement of cardiac silhouette with pulmonary vascular congestion. No acute abnormalities. Electronically Signed   By: Lavonia Dana M.D.   On: 07/30/2021 17:59   CT HEAD WO CONTRAST (5MM)  Result Date: 08/06/2021 CLINICAL DATA:  Follow-up examination for stroke. EXAM: CT HEAD WITHOUT  CONTRAST TECHNIQUE: Contiguous axial images were obtained from the base of the skull through the vertex without intravenous contrast. COMPARISON:  Prior MRI from 07/31/2021 FINDINGS: Brain: Chronic right PCA and posterior left MCA distribution infarcts again seen, stable. Continued interval evolution of previously identified acute left PCA distribution infarct, grossly stable in size and morphology as compared to prior MRI. Additional previously identified scattered smaller infarcts not visible by CT. No hemorrhagic transformation, mass effect, or other complication. No other new acute large vessel territory infarct. No interval hemorrhage. No mass lesion, midline shift, or significant mass effect. No hydrocephalus or extra-axial fluid collection. Vascular: No hyperdense vessel. Skull: Scalp soft tissues and calvarium demonstrate no new abnormality. Sinuses/Orbits: Globes and orbital soft tissues demonstrate no acute finding. Paranasal sinuses and mastoid air cells remain clear. Nasogastric tube noted. Other: None. IMPRESSION: 1. Continued interval evolution of previously identified  acute left PCA distribution infarct, grossly similar in size and morphology as compared to prior MRI. Additional smaller ischemic infarcts not visible by CT. No hemorrhagic transformation, mass effect, or other complication. 2. No other new acute intracranial abnormality. Electronically Signed   By: Jeannine Boga M.D.   On: 08/06/2021 01:47   CT HEAD WO CONTRAST (5MM)  Result Date: 07/30/2021 CLINICAL DATA:  Lethargy. EXAM: CT HEAD WITHOUT CONTRAST CT CERVICAL SPINE WITHOUT CONTRAST TECHNIQUE: Multidetector CT imaging of the head and cervical spine was performed following the standard protocol without intravenous contrast. Multiplanar CT image reconstructions of the cervical spine were also generated. COMPARISON:  None. FINDINGS: CT HEAD FINDINGS Brain: Subacute to chronic right occipital lobe infarct. No evidence for acute  hemorrhage, hydrocephalus, or abnormal extra-axial fluid collection. Patchy low attenuation in the deep hemispheric and periventricular white matter is nonspecific, but likely reflects chronic microvascular ischemic demyelination. Vascular: No hyperdense vessel or unexpected calcification. Skull: No evidence for fracture. No worrisome lytic or sclerotic lesion. Sinuses/Orbits: The visualized paranasal sinuses and mastoid air cells are clear. Visualized portions of the globes and intraorbital fat are unremarkable. Other: None. CT CERVICAL SPINE FINDINGS Alignment: Straightening of normal cervical lordosis. Skull base and vertebrae: Lower cervical spine partially obscured by motion artifact. Within this limitation, there is no evidence for acute fracture. Soft tissues and spinal canal: No prevertebral fluid or swelling. No visible canal hematoma. Disc levels:  Loss of disc height noted C5-6, C6-7, and C7-T1. Upper chest: Unremarkable. Other: None. IMPRESSION: 1. Probable chronic right occipital lobe infarct although subacute timing is a possibility. Otherwise, no acute intracranial findings. 2. Degenerative changes in the cervical spine without evidence for an acute fracture. 3. Loss of cervical lordosis. This can be related to patient positioning, muscle spasm or soft tissue injury. Electronically Signed   By: Misty Stanley M.D.   On: 07/30/2021 18:57   CT Cervical Spine Wo Contrast  Result Date: 07/30/2021 CLINICAL DATA:  Lethargy. EXAM: CT HEAD WITHOUT CONTRAST CT CERVICAL SPINE WITHOUT CONTRAST TECHNIQUE: Multidetector CT imaging of the head and cervical spine was performed following the standard protocol without intravenous contrast. Multiplanar CT image reconstructions of the cervical spine were also generated. COMPARISON:  None. FINDINGS: CT HEAD FINDINGS Brain: Subacute to chronic right occipital lobe infarct. No evidence for acute hemorrhage, hydrocephalus, or abnormal extra-axial fluid collection.  Patchy low attenuation in the deep hemispheric and periventricular white matter is nonspecific, but likely reflects chronic microvascular ischemic demyelination. Vascular: No hyperdense vessel or unexpected calcification. Skull: No evidence for fracture. No worrisome lytic or sclerotic lesion. Sinuses/Orbits: The visualized paranasal sinuses and mastoid air cells are clear. Visualized portions of the globes and intraorbital fat are unremarkable. Other: None. CT CERVICAL SPINE FINDINGS Alignment: Straightening of normal cervical lordosis. Skull base and vertebrae: Lower cervical spine partially obscured by motion artifact. Within this limitation, there is no evidence for acute fracture. Soft tissues and spinal canal: No prevertebral fluid or swelling. No visible canal hematoma. Disc levels:  Loss of disc height noted C5-6, C6-7, and C7-T1. Upper chest: Unremarkable. Other: None. IMPRESSION: 1. Probable chronic right occipital lobe infarct although subacute timing is a possibility. Otherwise, no acute intracranial findings. 2. Degenerative changes in the cervical spine without evidence for an acute fracture. 3. Loss of cervical lordosis. This can be related to patient positioning, muscle spasm or soft tissue injury. Electronically Signed   By: Misty Stanley M.D.   On: 07/30/2021 18:57   MR ANGIO HEAD WO CONTRAST  Result Date: 08/01/2021 CLINICAL DATA:  Follow-up stroke. Left PCA territory insult. Scattered other embolic distribution infarctions. Old right PCA territory stroke. EXAM: MRA HEAD WITHOUT CONTRAST TECHNIQUE: Angiographic images of the Circle of Willis were acquired using MRA technique without intravenous contrast. COMPARISON:  MRI yesterday. FINDINGS: Anterior circulation: Both internal carotid arteries are widely patent through the skull base and siphon regions. The anterior and middle cerebral vessels are patent. No large vessel occlusion or correctable proximal stenosis. More distal branch vessels  show mild atherosclerotic irregularity. Posterior circulation: Both vertebral arteries are widely patent to the basilar. No basilar stenosis. Left PICA shows flow. Large right anterior inferior cerebellar artery shows flow. Flow is present in both superior cerebellar arteries and both posterior cerebral arteries. Posterior cerebral arteries show considerable atherosclerotic irregularity. On the right, the vessel is diminutive consistent with the old infarction. On the left, there is 50% stenosis at the proximal PCA with distal vessel irregular changes. Anatomic variants: None significant. Other: None. IMPRESSION: No acute large vessel occlusion by MRA. Anterior circulation shows mild distal vessel atherosclerotic irregularity. Posterior circulation shows more extensive atherosclerotic disease in the posterior cerebral artery territories. The right PCA is diminutive and irregular consistent with previous infarction in that territory. The left PCA shows a 50% stenosis just beyond its origin, with moderate irregularity distal to that, but with patency presently. Electronically Signed   By: Nelson Chimes M.D.   On: 08/01/2021 07:55   MR BRAIN WO CONTRAST  Result Date: 07/31/2021 CLINICAL DATA:  Lethargy, mental status change EXAM: MRI HEAD WITHOUT CONTRAST TECHNIQUE: Multiplanar, multiecho pulse sequences of the brain and surrounding structures were obtained without intravenous contrast. COMPARISON:  MRI 12/19/2003, correlation is made with 07/30/2021 CT head. FINDINGS: Brain: Restricted diffusion with ADC correlate involving the medial left occipital lobe and posterior left hippocampus (series 9, image 66), with additional smaller areas of restricted diffusion in the medial right thalamus (series 9, image 72), left frontal lobe (series 9, images 77, 79, 88, 89), right frontal lobe (series 9, image 82, 87, 89), right parietal lobe (series 9, image 80), and right cerebellum (series 9, image 59). These are concerning  for acute infarcts, both in a left PCA territory and in an embolic pattern. Encephalomalacia and increased T2 signal in the medial right occipital lobe and posterior hippocampus, as well as left frontoparietal lobes, likely sequela of remote infarcts. No acute hemorrhage, mass, mass effect, or midline shift. No extra-axial collection or hydrocephalus. Vascular: Normal flow voids. Skull and upper cervical spine: Normal marrow signal. Sinuses/Orbits: Negative. Other: Trace fluid in bilateral mastoid air cells. IMPRESSION: 1. Restricted diffusion in the medial left occipital lobe and posterior left hippocampus, most likely an acute left PCA territory infarct. 2. Additional more punctate areas of restricted diffusion in the bilateral cerebral and right cerebellar hemisphere, as well as the medial right thalamus, concerning for additional embolic etiology. 3. Encephalomalacia in the medial right occipital lobe, right posterior hippocampus, and left frontoparietal region, likely sequela of remote infarcts. These results were called by telephone at the time of interpretation on 07/31/2021 at 12:58 am to provider MOLPUS, who verbally acknowledged these results. Electronically Signed   By: Merilyn Baba M.D.   On: 07/31/2021 00:58   DG Abd Portable 1V  Result Date: 08/02/2021 CLINICAL DATA:  Feeding tube placement EXAM: PORTABLE ABDOMEN - 1 VIEW COMPARISON:  None. FINDINGS: Feeding tube with weighted tip in the gastric antrum/pyloric region. Stylet removed. Normal bowel-gas pattern. IMPRESSION: Feeding tube with tip in  the gastric antral/pyloric region. Electronically Signed   By: Suzy Bouchard M.D.   On: 08/02/2021 15:14   EEG adult  Result Date: 08/05/2021 Lora Havens, MD     08/05/2021  4:31 PM Patient Name: Enrica Corliss MRN: [id number] Epilepsy Attending: Lora Havens Referring Physician/Provider: Dr Antony Contras Date: 08/05/2021 Duration: 32.48 mins Patient history: 55yo M with ams. EEG to evaluate  for seizure. Level of alertness: Awake AEDs during EEG study: None Technical aspects: This EEG study was done with scalp electrodes positioned according to the 10-20 International system of electrode placement. Electrical activity was acquired at a sampling rate of 500Hz  and reviewed with a high frequency filter of 70Hz  and a low frequency filter of 1Hz . EEG data were recorded continuously and digitally stored. Description: EEG showed continuous generalized 3 to 6 Hz theta-delta slowing. Hyperventilation and photic stimulation were not performed.   ABNORMALITY - Continuous slow, generalized IMPRESSION: This study is suggestive of moderate diffuse encephalopathy, nonspecific etiology. No seizures or epileptiform discharges were seen throughout the recording. Lora Havens   EEG adult  Result Date: 08/01/2021 Lora Havens, MD     08/01/2021  8:22 AM Patient Name: Lynnet Hefley MRN: [id number] Epilepsy Attending: Lora Havens Referring Physician/Provider: Kathie Dike, MD Date: 07/31/2021 Duration: 23.07 mins Patient history: 55 year old female with altered mental status.  EEG to evaluate for seizure. Level of alertness: Awake AEDs during EEG study: None Technical aspects: This EEG study was done with scalp electrodes positioned according to the 10-20 International system of electrode placement. Electrical activity was acquired at a sampling rate of 500Hz  and reviewed with a high frequency filter of 70Hz  and a low frequency filter of 1Hz . EEG data were recorded continuously and digitally stored. Description: EEG showed continuous generalized 3 to 6 Hz theta-delta slowing. Hyperventilation and photic stimulation were not performed.   ABNORMALITY - Continuous slow, generalized IMPRESSION: This study is suggestive of moderate diffuse encephalopathy, nonspecific etiology. No seizures or epileptiform discharges were seen throughout the recording. Priyanka O Yadav   VAS [place] TRANSCRANIAL DOPPLER W  BUBBLES  Result Date: 08/04/2021  Transcranial Doppler with Bubble Patient Name:  VINETA Broadhead  Date of Exam:   08/03/2021 Medical Rec #: [id number]    Accession #:    [id number] Date of Birth: 27-Jul-1966    [name] Patient Age:   57 years Exam Location:  [facility] Procedure:      VAS [place] TRANSCRANIAL DOPPLER W BUBBLES Referring Phys: Terrilee Croak --------------------------------------------------------------------------------  Indications: Stroke. History: Acute left PCA territory infarct, possibly embolic. Hypertension. Limitations: Difficult windows, altered mental status Comparison Study: No prior study Performing Technologist: Sharion Dove RVS  Examination Guidelines: A complete evaluation includes B-mode imaging, spectral Doppler, color Doppler, and power Doppler as needed of all accessible portions of each vessel. Bilateral testing is considered an integral part of a complete examination. Limited examinations for reoccurring indications may be performed as noted.  Summary: No HITS at rest or during Valsalva. Negative transcranial Doppler Bubble study with no evidence of right to left intracardiac communication.  *See table(s) above for TCD measurements and observations.  Diagnosing physician: Antony Contras MD Electronically signed by Antony Contras MD on 08/04/2021 at 1:05:49 PM.    Final    ECHOCARDIOGRAM COMPLETE  Result Date: 07/31/2021    ECHOCARDIOGRAM REPORT   Patient Name:   LACHERYL Gutterman Date of Exam: 07/31/2021 Medical Rec #:  [id number]   Height:       66.0  in Accession #:    [id number]  Weight:       224.9 lb Date of Birth:  [birth year]   BSA:          2.102 m Patient Age:    20 years    BP:           168/130 mmHg [name]           HR:           102 bpm. Exam Location:  Inpatient Procedure: 2D Echo, Color Doppler, Cardiac Doppler and Intracardiac            Opacification Agent Indications:    Stroke i63.9  History:        Patient has prior history of Echocardiogram  examinations, most                 recent 05/02/2020. CHF; Risk Factors:Hypertension.  Sonographer:    Raquel Sarna Senior RDCS Referring Phys: [place]  1. Left ventricular ejection fraction, by estimation, is at best 20%. The left ventricle has severely decreased function. The left ventricle demonstrates global hypokinesis. There is mild left ventricular hypertrophy. Left ventricular diastolic parameters are indeterminate. There is LV trabeculation without thrombus.  2. Right ventricular systolic function is moderately reduced. The right ventricular size is normal. There is severely elevated pulmonary artery systolic pressure. The estimated right ventricular systolic pressure is 25.3 mmHg.  3. Left atrial size was mildly dilated.  4. The mitral valve is normal in structure. Mild mitral valve regurgitation. No evidence of mitral stenosis.  5. Tricuspid valve regurgitation is moderate to severe.  6. The aortic valve is tricuspid. There is mild thickening of the aortic valve. Aortic valve regurgitation is not visualized. Aortic valve sclerosis is present, with no evidence of aortic valve stenosis.  7. The inferior vena cava is dilated in size with <50% respiratory variability, suggesting right atrial pressure of 15 mmHg. Comparison(s): A prior study was performed on 05/02/2020. Further decrease in LV function. FINDINGS  Left Ventricle: Left ventricular ejection fraction, by estimation, is 20%. The left ventricle has severely decreased function. The left ventricle demonstrates global hypokinesis. Definity contrast agent was given IV to delineate the left ventricular endocardial borders. The left ventricular internal cavity size was normal in size. There is mild left ventricular hypertrophy. Left ventricular diastolic parameters are indeterminate. Right Ventricle: The right ventricular size is normal. No increase in right ventricular wall thickness. Right ventricular systolic function is moderately  reduced. There is severely elevated pulmonary artery systolic pressure. The tricuspid regurgitant velocity is 3.56 m/s, and with an assumed right atrial pressure of 15 mmHg, the estimated right ventricular systolic pressure is 66.4 mmHg. Left Atrium: Left atrial size was mildly dilated. Right Atrium: Right atrial size was normal in size. Pericardium: Trivial pericardial effusion is present. Mitral Valve: The mitral valve is normal in structure. Mild mitral valve regurgitation. No evidence of mitral valve stenosis. Tricuspid Valve: The tricuspid valve is normal in structure. Tricuspid valve regurgitation is moderate to severe. Aortic Valve: The aortic valve is tricuspid. There is mild thickening of the aortic valve. Aortic valve regurgitation is not visualized. Aortic valve sclerosis is present, with no evidence of aortic valve stenosis. Pulmonic Valve: The pulmonic valve was not well visualized. Pulmonic valve regurgitation is not visualized. No evidence of pulmonic stenosis. Aorta: The aortic root and ascending aorta are structurally normal, with no evidence of dilitation. Venous: The inferior vena cava is dilated in size  with less than 50% respiratory variability, suggesting right atrial pressure of 15 mmHg. IAS/Shunts: The atrial septum is grossly normal.  LEFT VENTRICLE PLAX 2D LVIDd:         4.90 cm   Diastology LVIDs:         3.90 cm   LV e' medial:    2.93 cm/s LV PW:         1.20 cm   LV E/e' medial:  25.6 LV IVS:        1.20 cm   LV e' lateral:   3.08 cm/s LVOT diam:     2.00 cm   LV E/e' lateral: 24.4 LV SV:         28 LV SV Index:   13 LVOT Area:     3.14 cm  RIGHT VENTRICLE RV S prime:     7.05 cm/s TAPSE (M-mode): 1.4 cm LEFT ATRIUM             Index        RIGHT ATRIUM           Index LA diam:        3.70 cm 1.76 cm/m   RA Area:     20.10 cm LA Vol (A2C):   80.4 ml 38.25 ml/m  RA Volume:   59.00 ml  28.07 ml/m LA Vol (A4C):   75.4 ml 35.87 ml/m LA Biplane Vol: 79.4 ml 37.77 ml/m  AORTIC VALVE  LVOT Vmax:   72.50 cm/s LVOT Vmean:  46.800 cm/s LVOT VTI:    0.090 m  AORTA Ao Root diam: 3.10 cm Ao Asc diam:  3.60 cm MITRAL VALVE               TRICUSPID VALVE MV Area (PHT): 6.96 cm    TR Peak grad:   50.7 mmHg MV Decel Time: 109 msec    TR Vmax:        356.00 cm/s MV E velocity: 75.00 cm/s MV A velocity: 46.70 cm/s  SHUNTS MV E/A ratio:  1.61        Systemic VTI:  0.09 m                            Systemic Diam: 2.00 cm Rudean Haskell MD Electronically signed by Rudean Haskell MD Signature Date/Time: 07/31/2021/1:08:43 PM    Final    CT Renal Stone Study  Result Date: 07/30/2021 CLINICAL DATA:  Intermittent nausea and vomiting for 2 weeks EXAM: CT ABDOMEN AND PELVIS WITHOUT CONTRAST TECHNIQUE: Multidetector CT imaging of the abdomen and pelvis was performed following the standard protocol without IV contrast. COMPARISON:  10/11/2012 FINDINGS: Lower chest: No acute pleural or parenchymal lung disease. Hepatobiliary: Unremarkable unenhanced appearance of the liver and gallbladder. Pancreas: Unremarkable unenhanced appearance. Spleen: Unremarkable unenhanced appearance. Adrenals/Urinary Tract: There is a 4.1 by 4.7 by 3.2 cm rounded structure interposed between the stomach and ventral aspect upper pole left kidney. Given location, this could reflect gastric diverticulum, pancreatic tail mass, or exophytic renal mass. Nonemergent follow-up abdominal CT with IV and oral contrast is recommended. No urinary tract calculi or obstructive uropathy within either kidney. The adrenals are unremarkable. Intramural gas is seen throughout the bladder consistent with emphysematous cystitis. Stomach/Bowel: No bowel obstruction or ileus. Normal appendix right lower quadrant. Sigmoid diverticulosis without diverticulitis. No bowel wall thickening or inflammatory change. Vascular/Lymphatic: Aortic atherosclerosis. No enlarged abdominal or pelvic lymph nodes. Reproductive: Status post hysterectomy. No adnexal  masses. Other: No free fluid or free gas.  No abdominal wall hernia. Musculoskeletal: No acute or destructive bony lesions. Reconstructed images demonstrate no additional findings. IMPRESSION: 1. Emphysematous cystitis. 2. 4.7 cm rounded soft tissue structure interposed between the gastric fundus and the upper pole left kidney. Given location, gastric diverticulum, pancreatic tail mass, or exophytic renal mass could give this appearance. Nonemergent follow-up CT of the abdomen with IV and oral contrast is recommended. 3. Sigmoid diverticulosis without diverticulitis. 4.  Aortic Atherosclerosis (ICD10-I70.0). Critical Value/emergent results were called by telephone at the time of interpretation on 07/30/2021 at 11:42 pm to provider DR Lorin Glass, who verbally acknowledged these results. Electronically Signed   By: Randa Ngo M.D.   On: 07/30/2021 23:42   VAS US CAROTID  Result Date: 08/01/2021 Carotid Arterial Duplex Study Patient Name:  SKARLET Ly  Date of Exam:   08/01/2021 Medical Rec #: [id number]    Accession #:    [id number] Date of Birth: Apr 02, 1966    [name] Patient Age:   23 years Exam Location:  [facility] Procedure:      VAS US CAROTID Referring Phys: Anibal Henderson --------------------------------------------------------------------------------  Indications:       CVA and altered mental status. Other Factors:     Uncontrolled hypertension, left PCA territory ischemic CVA.                    History of advanced cardiomyopathy with systolic dysfunction. Comparison Study:  No prior Performing Technologist: Oda Cogan RDMS, RVT  Examination Guidelines: A complete evaluation includes B-mode imaging, spectral Doppler, color Doppler, and power Doppler as needed of all accessible portions of each vessel. Bilateral testing is considered an integral part of a complete examination. Limited examinations for reoccurring indications may be performed as noted.  Right Carotid Findings:  +----------+--------+--------+--------+------------------+------------------+             PSV cm/s EDV cm/s Stenosis Plaque Description Comments            +----------+--------+--------+--------+------------------+------------------+  CCA Prox   59       10                                                       +----------+--------+--------+--------+------------------+------------------+  CCA Distal 58       9                                    intimal thickening  +----------+--------+--------+--------+------------------+------------------+  ICA Prox   46       15       1-39%                       intimal thickening  +----------+--------+--------+--------+------------------+------------------+  ICA Mid    40       13                                                       +----------+--------+--------+--------+------------------+------------------+  ICA Distal 42       13                                                       +----------+--------+--------+--------+------------------+------------------+  ECA        39       10                                                       +----------+--------+--------+--------+------------------+------------------+ +----------+--------+-------+----------------+-------------------+             PSV cm/s EDV cms Describe         Arm Pressure (mmHG)  +----------+--------+-------+----------------+-------------------+  Subclavian 142              Multiphasic, WNL                      +----------+--------+-------+----------------+-------------------+ +---------+--------+--+--------+--+---------+  Vertebral PSV cm/s 48 EDV cm/s 14 Antegrade  +---------+--------+--+--------+--+---------+  Left Carotid Findings: +----------+--------+--------+--------+------------------+------------------+             PSV cm/s EDV cm/s Stenosis Plaque Description Comments            +----------+--------+--------+--------+------------------+------------------+  CCA Prox   64       13                                                        +----------+--------+--------+--------+------------------+------------------+  CCA Distal 45       9                                    intimal thickening  +----------+--------+--------+--------+------------------+------------------+  ICA Prox   35       11       1-39%                       intimal thickening  +----------+--------+--------+--------+------------------+------------------+  ICA Mid    43       14                                                       +----------+--------+--------+--------+------------------+------------------+  ICA Distal 52       17                                                       +----------+--------+--------+--------+------------------+------------------+  ECA        39       9                                                        +----------+--------+--------+--------+------------------+------------------+ +----------+--------+--------+----------------+-------------------+             PSV cm/s EDV cm/s Describe         Arm Pressure (mmHG)  +----------+--------+--------+----------------+-------------------+  Subclavian 112  Multiphasic, WNL                      +----------+--------+--------+----------------+-------------------+ +---------+--------+--+--------+--+---------+  Vertebral PSV cm/s 35 EDV cm/s 13 Antegrade  +---------+--------+--+--------+--+---------+   Summary: Right Carotid: Velocities in the right ICA are consistent with a 1-39% stenosis. Left Carotid: Velocities in the left ICA are consistent with a 1-39% stenosis. Vertebrals:  Bilateral vertebral arteries demonstrate antegrade flow. Subclavians: Normal flow hemodynamics were seen in bilateral subclavian              arteries. *See table(s) above for measurements and observations.  Electronically signed by Monica Martinez MD on 08/01/2021 at 7:54:15 PM.    Final    VAS [place] LOWER EXTREMITY VENOUS (DVT)  Result Date: 08/04/2021  Lower Venous DVT Study Patient Name:   DISNEY Scherzer  Date of Exam:   08/03/2021 Medical Rec #: [id number]    Accession #:    [id number] Date of Birth: December 13, 1965    [name] Patient Age:   68 years Exam Location:  [facility] Procedure:      VAS [place] LOWER EXTREMITY VENOUS (DVT) Referring Phys: Terrilee Croak --------------------------------------------------------------------------------  Indications: Stroke.  Limitations: Altered mental status and constant movement of left lower extremity. Comparison Study: No prior study Performing Technologist: Sharion Dove RVS  Examination Guidelines: A complete evaluation includes B-mode imaging, spectral Doppler, color Doppler, and power Doppler as needed of all accessible portions of each vessel. Bilateral testing is considered an integral part of a complete examination. Limited examinations for reoccurring indications may be performed as noted. The reflux portion of the exam is performed with the patient in reverse Trendelenburg.  +---------+---------------+---------+-----------+----------+--------------+  RIGHT     Compressibility Phasicity Spontaneity Properties Thrombus Aging  +---------+---------------+---------+-----------+----------+--------------+  CFV       Full            Yes       Yes                                    +---------+---------------+---------+-----------+----------+--------------+  SFJ       Full                                                             +---------+---------------+---------+-----------+----------+--------------+  FV Prox   Full                                                             +---------+---------------+---------+-----------+----------+--------------+  FV Mid    Full                                                             +---------+---------------+---------+-----------+----------+--------------+  FV Distal Full                                                             +---------+---------------+---------+-----------+----------+--------------+  PFV       Full            Yes       Yes                                    +---------+---------------+---------+-----------+----------+--------------+  POP       Full                                                             +---------+---------------+---------+-----------+----------+--------------+  PTV       Full                                                             +---------+---------------+---------+-----------+----------+--------------+  PERO      Full                                                             +---------+---------------+---------+-----------+----------+--------------+   +---------+---------------+---------+-----------+----------+-------------------+  LEFT      Compressibility Phasicity Spontaneity Properties Thrombus Aging       +---------+---------------+---------+-----------+----------+-------------------+  CFV       Full            Yes       Yes                                         +---------+---------------+---------+-----------+----------+-------------------+  SFJ       Full                                                                  +---------+---------------+---------+-----------+----------+-------------------+  FV Prox                   Yes       Yes                    patent by color and                                                              Doppler              +---------+---------------+---------+-----------+----------+-------------------+  FV Mid    Full                                                                  +---------+---------------+---------+-----------+----------+-------------------+  FV Distal Full                                                                  +---------+---------------+---------+-----------+----------+-------------------+  PFV                                                        patent by color and                                                              Doppler               +---------+---------------+---------+-----------+----------+-------------------+  POP       Full            Yes       Yes                                         +---------+---------------+---------+-----------+----------+-------------------+  PTV       Full                                                                  +---------+---------------+---------+-----------+----------+-------------------+  PERO                                                       Not well visualized  +---------+---------------+---------+-----------+----------+-------------------+     Summary: BILATERAL: -No evidence of popliteal cyst, bilaterally. RIGHT: - There is no evidence of deep vein thrombosis in the lower extremity. However, portions of this examination were limited- see technologist comments above.  LEFT: - There is no evidence of deep vein thrombosis in the lower extremity. However, portions of this examination were limited- see technologist comments above.  *See table(s) above for measurements and observations. Electronically signed by Orlie Pollen on 08/04/2021 at 12:07:03 PM.    Final    [place] EKG SITE RITE  Result Date: 08/01/2021 If Site Rite image not attached, placement could not be confirmed due to current cardiac rhythm.   Labs:  CBC: Recent Labs    08/02/21 0432 08/04/21 0640 08/05/21 0322 08/06/21 0339  WBC 21.5* 12.6* 12.0* 11.8*  HGB 14.9 15.4* 15.1* 14.8  HCT 43.4 46.6* 44.5 43.8  PLT 223 298 320 337    COAGS: No results for input(s): INR, APTT in the last 8760 hours.  BMP: Recent Labs    08/03/21 0501 08/04/21 0640 08/05/21 0322 08/06/21 0339  NA 139 143  142 144  K 3.2* 3.1* 3.0* 3.4*  CL 101 104 103 104  CO2 29 29 30 31   GLUCOSE 173* 174* 155* 124*  BUN 19 16 20  21*  CALCIUM 8.2* 9.0 9.2 9.1  CREATININE 0.89 0.86 0.90 0.82  GFRNONAA >60 >60 >60 >60    LIVER FUNCTION TESTS: Recent Labs    07/31/21 0344 07/31/21 1840 08/02/21 0432 08/04/21 0640  BILITOT 2.0* 1.5*  0.8 0.8  AST 80* 73* 125* 65*  ALT 33 43 127* 95*  ALKPHOS 73 107 109 96  PROT 7.2 6.6 7.4 6.2*  ALBUMIN 4.0 3.7 3.9 3.2*    TUMOR MARKERS: No results for input(s): AFPTM, CEA, CA199, CHROMGRNA in the last 8760 hours.  Assessment and Plan: Pt with hx sepsis due to emphysematous cystitis, currently on antbx - LD on 12/22, multifocal CVA with subsequent dysphagia- NG in place, encephalopathy, heart failure, HTN,AKI, rhabdomyolysis- improved, bipolar depression; full code at this time; request now received for perc G tube placement; imaging studies were reviewed by Dr. Pascal Lux; anatomy appears ok for tube placement; Risks and benefits image guided gastrostomy tube placement was discussed with the patient's daughter/husband including, but not limited to the need for a barium enema during the procedure, bleeding, infection, peritonitis and/or damage to adjacent structures.  All of the patient's questions were answered, patient is agreeable to proceed.  Consent signed and in chart. Will need to hold ASA/lovenox doses until after tube placed; case tent planned for 12/21; latest temp 99.3, creat nl, WBC 11.8, plts nl, PT/INR pending; on IV omnipen    Thank you for this interesting consult.  I greatly enjoyed meeting Kaitlynn Tramontana and look forward to participating in their care.  A copy of this report was sent to the requesting provider on this date.  Electronically Signed: D. Rowe Robert, PA-C 08/06/2021, 2:07 PM   I spent a total of   30 minutes  in face to face in clinical consultation, greater than 50% of which was counseling/coordinating care for percutaneous gastrostomy tube placement

## 2021-08-06 NOTE — Progress Notes (Signed)
Case cancelled d/t HTN and patient illness

## 2021-08-06 NOTE — Progress Notes (Signed)
PROGRESS NOTE  Dorothy Alvarez  DOB: 10/16/1965  PCP: Nolene Ebbs, MD IWL:[id number]  DOA: 07/30/2021  LOS: 7 days  Hospital Day: 8  Chief Complaint  Patient presents with   lethargic   Emesis   Hypertension          Brief narrative: Dorothy Alvarez is a 55 y.o. female with PMH significant for HTN, chronic pain syndrome, bipolar disorder/depression  Patient was brought to the ED at [facility] on 12/13 with complaint of increasing somnolence, lethargy for last several days.  In the ER, she was noted to be febrile.   Labs showed elevated WBC count, AKI, rhabdomyolysis, lactic acidosis. CT renal study showed emphysematous cystitis and a 4.7 cm rounded soft tissue structure between the gastric fundus and left upper pole of kidney. 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   MRI of the brain showed a diffusion defect concerning for acute left PCA territory infarct.   Patient was started on IV fluid, IV antibiotics. Patient was admitted to hospitalist service for sepsis, stroke. She was transferred to [facility] for neurology consult.  On arrival to [facility], patient was noted to be critical and transferred to ICU service. Further work-up showed echo with a low LVEF of ~ 20%, LV with global hypokinesis, RV systolic function moderately reduced with RVSP ~65, LA mildly dilated, moderate to severe TVR.  With clinical improvement, patient was transferred out of ICU service to [facility] on 12/17. Patient's mental status has not improved for the last several days and remains comatose.  Subjective: Patient was seen and examined this morning.   Daughter at bedside.   Patient shows spontaneous movement of her extremities but does not wake up on command.  When tried to open her eyelids, she actively resists.   Was planned for TEE today but canceled by anesthesia because she was deemed unable to protect her ED  airway with sedation.    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 -Urine culture sent on admission grew only 10,000 CFU per mL of Enterococcus faecalis. -To complete 7-day course of ampicillin IV on 12/22. -Currently has Foley catheter.  Unable to remove as her mental status remains very poor.  -Per PCCM, this was discussed with urology.  Bilateral multifocal stroke -Per neurology note, patient had multifocal infarcts including left PCA, right cerebellum, bilateral frontal and right thalamus.  Etiology likely embolic from endocarditis versus cardiomyopathy with low EF -MRI did not show large vessel occlusion but 50% stenosis of left PCA -Carotid duplex with nonsignificant stenosis of bilateral carotids -Echo with EF 20% and no LV thrombus -A1c 6.4, LDL 141 -Currently patient is unable to follow any commands, has NG tube for feeding. -12/20, patient was scheduled for TEE today but on evaluation by anesthesia, she was deemed at high risk of intubation.  I had a discussion with cardiologist Dr. Gardiner Rhyme. The initial reason for ordering a TEE was with the suspicion of endocarditis.  However, on retrospective chart review, I believe patient's sepsis was due to cystitis.  She is improving on IV antibiotics.  Blood cultures from 12/13 was negative.  I think at this time I would hold off on getting a TEE particularly considering the high risk of intubation. -Prior to admission, patient was not on any antiplatelet/anticoagulant.  Per neurology recommendation, patient is currently on aspirin 325 mg.   -Once  patient gets a PEG tube placed, will switch her to anticoagulation with DOAC. -Continue Lipitor 80 mg daily.  Dysphagia -Per speech therapy note from 12/16, patient opened eyes and passed speech therapy evaluation.  Regular thin liquid diet was recommended.   -Her mental status however since then has not improved.  She has  barely been able to open eyes on command.  Spontaneous movement seen but unable to follow command to eat.  Currently she has ongoing NG tube feeding. -I discussed with patient's daughter as well as neurology team.  PEG tube placement ordered.  Acute metabolic encephalopathy  -multifactorial -bilateral multifocal stroke, polypharmacy at home, AKI with rhabdo, emphysematous cystitis, possible low flow state.  -Ammonia WNL, UDS neg, not hypercapnic on ABG, no evidence of seizure on EEG  -lumbar puncture on 12/15 and CSF was sent for culture.  It showed predominantly mononuclear WBC but no organisms-not convincing for bacterial meningitis -Continue delirium precautions -Sedatives is on hold. -It is unclear at this time if patient is also showing any active resistance to waking up.  Family states that she responded 'yes' when her mom asked her questions yesterday. -When I try to open her eyelids, she actively resists.  Neurology has started the patient on amantadine without much improvement.   Acute on chronic biventricular heart failure  Essential hypertension -Echo with a EF less than 20%, global hypokinesis, no LV thrombus, elevated RV systolic pressure to 66. -Was on Cleviprex in ICU.  Weaned down. -Although her home med list does not show cardiac meds, discharge med list from September last year shows Coreg, Lasix, Entresto, Imdur, hydralazine.  -12/18, started on Coreg, and Imdur.   -She was not able to get morning pills today because of which her pressures have been elevated.  Communicated with RN to give morning dose later when she returns from TEE suite.  AKI -Creatinine was up at 1.73 on admission.  Improved with hydration.  Rhabdomyolysis -On admission, CK level was elevated over 8000, probably related to prolonged immobility, AKI. -Improved with hydration.  Continue tube feeding and hydration  Lactic acidosis -Lactic acid level trend as below, peaked at 5.3.  Gradually trended down  and normalized.  Mild transaminitis -Unclear cause, may be related to right heart failure itself.  Improving. Recent Labs  Lab 07/30/21 1712 07/31/21 0344 07/31/21 1840 08/02/21 0432 08/04/21 0640  AST 107* 80* 73* 125* 65*  ALT 36 33 43 127* 95*  ALKPHOS 87 73 107 109 96  BILITOT 1.7* 2.0* 1.5* 0.8 0.8  PROT 8.1 7.2 6.6 7.4 6.2*  ALBUMIN 4.5 4.0 3.7 3.9 3.2*   Hypokalemia/hypophosphatemia -Potassium level remains low.  Daily replacement ordered. Recent Labs  Lab 08/01/21 0448 08/02/21 0432 08/02/21 1431 08/02/21 1749 08/03/21 0501 08/03/21 1701 08/04/21 0640 08/05/21 0322 08/06/21 0339  K 3.6 3.0*  --   --  3.2*  --  3.1* 3.0* 3.4*  MG 1.9  --  2.5* 2.4 2.3 2.2  --   --   --   PHOS  --   --  2.7 2.7 2.1* 3.1  --   --   --    Bipolar depression Chronic Pain  -No meds on hold   Abdominal Mass  -Incidental finding of 4.7 cm soft tissue mass on CT renal study done on admission. -Once her mental status and overall prognosis improves, we can consider obtaining CT abdomen pelvis with IV and oral contrast.  At this time, it would not change the course of her illness.  Goals of care -Patient is not showing good neurological recovery. Discussed with family.  Palliative care consulted on 12/18.  Remains full code at this time.  Mobility: PT eval Living condition: Was living at home with husband Nutritional status: Body mass index is 36.12 kg/m.  Nutrition Problem: Inadequate oral intake Etiology: acute illness Signs/Symptoms: meal completion < 25% Diet:  Diet Order     None      DVT prophylaxis:  enoxaparin (LOVENOX) injection 40 mg Start: 08/02/21 1000 Place and maintain sequential compression device Start: 08/01/21 1458   Antimicrobials: IV ampicillin till 12/24 Fluid: None Consultants: Neurology Family Communication: Patient's daughter at bedside.  Status is: Inpatient  Continue in-hospital care because: Continues to need IV antibiotics, pending PEG  tube placement Level of care: Progressive   Dispo: The patient is from: Home              Anticipated d/c is to: Unclear at this time.  Probably SNF after PEG tube placement              Patient currently is not medically stable to d/c.   Difficult to place patient No     Infusions:   ampicillin (OMNIPEN) IV 2 g (08/06/21 0517)   feeding supplement (OSMOLITE 1.5 CAL) 1,000 mL (08/05/21 1602)    Scheduled Meds:  amantadine  150 mg Per Tube BID   amLODipine  10 mg Per Tube Daily   aspirin  300 mg Rectal Daily   Or   aspirin  325 mg Per Tube Daily   atorvastatin  80 mg Per Tube Daily   carvedilol  6.25 mg Per Tube BID WC   chlorhexidine  15 mL Mouth Rinse BID   Chlorhexidine Gluconate Cloth  6 each Topical Daily   cloNIDine  0.1 mg Oral BID   enoxaparin (LOVENOX) injection  40 mg Subcutaneous Q24H   feeding supplement (PROSource TF)  90 mL Per Tube BID   isosorbide dinitrate  10 mg Per Tube TID   mouth rinse  15 mL Mouth Rinse q12n4p   potassium chloride  20 mEq Per Tube BID    PRN meds: acetaminophen **OR** acetaminophen (TYLENOL) oral liquid 160 mg/5 mL **OR** acetaminophen, hydrALAZINE, labetalol, ondansetron (ZOFRAN) IV   Antimicrobials: Anti-infectives (From admission, onward)    Start     Dose/Rate Route Frequency Ordered Stop   08/02/21 1000  vancomycin (VANCOREADY) IVPB 1250 mg/250 mL  Status:  Discontinued        1,250 mg 166.7 mL/hr over 90 Minutes Intravenous Every 24 hours 08/01/21 1008 08/02/21 1002   08/01/21 1115  ampicillin (OMNIPEN) 2 g in sodium chloride 0.9 % 100 mL IVPB        2 g 300 mL/hr over 20 Minutes Intravenous Every 6 hours 08/01/21 1023 08/08/21 1159   08/01/21 1100  cefTRIAXone (ROCEPHIN) 2 g in sodium chloride 0.9 % 100 mL IVPB  Status:  Discontinued        2 g 200 mL/hr over 30 Minutes Intravenous Every 12 hours 08/01/21 1000 08/02/21 1002   08/01/21 1100  vancomycin (VANCOREADY) IVPB 2000 mg/400 mL        2,000 mg 200 mL/hr over 120  Minutes Intravenous  Once 08/01/21 1007 08/01/21 1349   08/01/21 0800  cefTRIAXone (ROCEPHIN) 2 g in sodium chloride 0.9 % 100 mL IVPB  Status:  Discontinued        2 g 200 mL/hr over 30 Minutes Intravenous Every 24 hours 07/31/21 1354 08/01/21 1000  07/31/21 0800  cefTRIAXone (ROCEPHIN) 1 g in sodium chloride 0.9 % 100 mL IVPB  Status:  Discontinued        1 g 200 mL/hr over 30 Minutes Intravenous Every 24 hours 07/30/21 2349 07/31/21 1354   07/30/21 1915  ceFEPIme (MAXIPIME) 2 g in sodium chloride 0.9 % 100 mL IVPB        2 g 200 mL/hr over 30 Minutes Intravenous  Once 07/30/21 1907 07/30/21 2020   07/30/21 1900  vancomycin (VANCOCIN) IVPB 1000 mg/200 mL premix        1,000 mg 200 mL/hr over 60 Minutes Intravenous  Once 07/30/21 1847 07/30/21 2133       Objective: Vitals:   08/06/21 1126 08/06/21 1206  BP: (!) 213/120 (!) 194/112  Pulse: 86 82  Resp:  18  Temp:  99.3 F (37.4 C)  SpO2: 100% 100%    Intake/Output Summary (Last 24 hours) at 08/06/2021 1257 Last data filed at 08/06/2021 1207 Gross per 24 hour  Intake 785.22 ml  Output 1750 ml  Net -964.78 ml   Filed Weights   08/03/21 0500 08/04/21 0500 08/06/21 0500  Weight: 100.5 kg 99.1 kg 101.5 kg   Weight change:  Body mass index is 36.12 kg/m.   Physical Exam: General exam: Middle-aged African-American female. spontaneous movements seen but unable to follow commands.  Not in pain. Skin: No rashes, lesions or ulcers. HEENT: Atraumatic, normocephalic, no obvious bleeding.  Has NG tube in place Lungs: Diminished air entry in both bases CVS: Regular rate and rhythm, no murmur GI/Abd soft, nontender, nondistended, bowel sound present CNS: Eye movements seen under the eyelids but does not open eyes.  Actively resists when trying to open eyelids.  Spontaneous movement of extremities seen. Psychiatry: Sad affect Extremities: No pedal edema, no calf tenderness  Data Review: I have personally reviewed the laboratory  data and studies available.  F/u labs ordered Unresulted Labs (From admission, onward)     Start     Ordered   08/05/21 0500  CK  Daily,   R     Question:  Specimen collection method  Answer:  Lab=Lab collect   08/04/21 0902            Signed, Terrilee Croak, MD [facility] 08/06/2021

## 2021-08-06 NOTE — Progress Notes (Signed)
STROKE TEAM PROGRESS NOTE       INTERVAL HISTORY  Patient is seen on the way for TEE.Marland Kitchen  Neurological exam remains unchanged  .vital signs stable.  Repeat CT scan of the head yesterday showed stable changes without any acute abnormality.  EEG showed no seizure activity.  No family available at the bedside today. Vitals:   08/06/21 0730 08/06/21 1039 08/06/21 1126 08/06/21 1206  BP: (!) 178/109 (!) 185/110 (!) 213/120 (!) 194/112  Pulse: 85 91 86 82  Resp: 18 15  18   Temp: 99.5 F (37.5 C)   99.3 F (37.4 C)  TempSrc: Oral   Oral  SpO2: 100% 100% 100% 100%  Weight:      Height:       CBC:  Recent Labs  Lab 08/05/21 0322 08/06/21 0339  WBC 12.0* 11.8*  NEUTROABS 8.5* 8.1*  HGB 15.1* 14.8  HCT 44.5 43.8  MCV 94.1 93.0  PLT 320 568   Basic Metabolic Panel:  Recent Labs  Lab 08/03/21 0501 08/03/21 1701 08/04/21 0640 08/05/21 0322 08/06/21 0339  NA 139  --    < > 142 144  K 3.2*  --    < > 3.0* 3.4*  CL 101  --    < > 103 104  CO2 29  --    < > 30 31  GLUCOSE 173*  --    < > 155* 124*  BUN 19  --    < > 20 21*  CREATININE 0.89  --    < > 0.90 0.82  CALCIUM 8.2*  --    < > 9.2 9.1  MG 2.3 2.2  --   --   --   PHOS 2.1* 3.1  --   --   --    < > = values in this interval not displayed.   Lipid Panel:  Recent Labs  Lab 07/31/21 0344  CHOL 211*  TRIG 118  HDL 46  CHOLHDL 4.6  VLDL 24  LDLCALC 141*   HgbA1c:  Recent Labs  Lab 07/31/21 0344  HGBA1C 6.4*   Urine Drug Screen:  Recent Labs  Lab 07/30/21 2046  LABOPIA NONE DETECTED  COCAINSCRNUR NONE DETECTED  LABBENZ NONE DETECTED  AMPHETMU NONE DETECTED  THCU NONE DETECTED  LABBARB NONE DETECTED    Alcohol Level  Recent Labs  Lab 07/30/21 1712  ETH <10    IMAGING past 24 hours CT HEAD WO CONTRAST (5MM)  Result Date: 08/06/2021 CLINICAL DATA:  Follow-up examination for stroke. EXAM: CT HEAD WITHOUT CONTRAST TECHNIQUE: Contiguous axial images were obtained from the base of the skull through the  vertex without intravenous contrast. COMPARISON:  Prior MRI from 07/31/2021 FINDINGS: Brain: Chronic right PCA and posterior left MCA distribution infarcts again seen, stable. Continued interval evolution of previously identified acute left PCA distribution infarct, grossly stable in size and morphology as compared to prior MRI. Additional previously identified scattered smaller infarcts not visible by CT. No hemorrhagic transformation, mass effect, or other complication. No other new acute large vessel territory infarct. No interval hemorrhage. No mass lesion, midline shift, or significant mass effect. No hydrocephalus or extra-axial fluid collection. Vascular: No hyperdense vessel. Skull: Scalp soft tissues and calvarium demonstrate no new abnormality. Sinuses/Orbits: Globes and orbital soft tissues demonstrate no acute finding. Paranasal sinuses and mastoid air cells remain clear. Nasogastric tube noted. Other: None. IMPRESSION: 1. Continued interval evolution of previously identified acute left PCA distribution infarct, grossly similar in size and morphology as  compared to prior MRI. Additional smaller ischemic infarcts not visible by CT. No hemorrhagic transformation, mass effect, or other complication. 2. No other new acute intracranial abnormality. Electronically Signed   By: Jeannine Boga M.D.   On: 08/06/2021 01:47   EEG adult  Result Date: 08/05/2021 Lora Havens, MD     08/05/2021  4:31 PM [name] MRN: [id number] Epilepsy Attending: Lora Havens Referring Physician/Provider: Dr Antony Contras Date: 08/05/2021 Duration: 32.48 mins Patient history: 55yo M with ams. EEG to evaluate for seizure. Level of alertness: Awake AEDs during EEG study: None Technical aspects: This EEG study was done with scalp electrodes positioned according to the 10-20 International system of electrode placement. Electrical activity was acquired at a sampling rate of 500Hz  and reviewed with a high  frequency filter of 70Hz  and a low frequency filter of 1Hz . EEG data were recorded continuously and digitally stored. Description: EEG showed continuous generalized 3 to 6 Hz theta-delta slowing. Hyperventilation and photic stimulation were not performed.   ABNORMALITY - Continuous slow, generalized IMPRESSION: This study is suggestive of moderate diffuse encephalopathy, nonspecific etiology. No seizures or epileptiform discharges were seen throughout the recording. [place]    PHYSICAL EXAM General:  Patient is a obese middle-aged African-American female in no acute distress  Neuro:  Patient is lying in bed with eyes closed will moan in response to name but will not answer questions or follow commands.  Her eyes are tightly closed and she resists opening her eyes by examiner.  She does slightly open them occasionally.  She will move all four extremities spontaneously and purposefully against gravity but will not follow commands..  ASSESSMENT/PLAN Dorothy Alvarez is a 55 y.o. female with history of anxiety, depression, HTN and chronic pain presenting with lethargy and altered mental status.  She presented to the ED at [facility] on 12/13 with progressive altered mental status and lethargy.  MRI was performed reveling and acute left PCA territory infarct, possibly embolic.  She was then transferred here for further evaluation.  EEG reveled no seizure activity.  Patient has required Cleviprex for BP control and was febrile yesterday to 101.1.  Plan is to obtain LP to rule out infectious causes of her altered mental status.  TEE will be performed to rule out endocarditis.  Stroke: Multifocal infarcts including left PCA, right cerebellum, bilateral frontal and right thalamus likely embolic from endocarditis vs. cardiomyopathy with low EF CT head Chronic right occipital lobe infarct and degenerative changes in cervical spine. MRI  acute left PCA territory infarct, punctate areas of  restricted diffusion in bilateral cerebral and right cerebellar hemisphere as well as medial right thalamus, encephalomalacia in medial right occipital lobe, right posterior hippocampus and left frontoparietal region MRA  no large vessel occlusion but 50% left PCA stenosis Carotid Doppler  1-39% stenosis in bilateral carotids 2D Echo EF 20%, no LV thrombus EEG moderate diffuse encephalopathy, no seizure LDL 141 HgbA1c 6.4 VTE prophylaxis - Lovenox subq No antithrombotic prior to admission, now on ASA 325mg . Will recommend DOAC due to low EF if TEE negative for endocarditis. Once EF > 30%, can switch DOAC back to antiplatelet.  Therapy recommendations:  SNF Disposition:  pending  CHF 04/2021 EF 25 to 30%.  Per discharge, patient discharged on Lasix 80, Entresto 49/51 twice daily, Coreg 3.125 twice daily, hydralazine 75 3 times daily and isosorbide 15 twice daily.  However, dose medication was not on patient medication list on admission, concerning  for medication noncompliance. Patient also did not follow-up with cardiology, concerning for noncompliance This admission, EF 20% TEE pending 12/20 Will recommend DOAC due to low EF if TEE negative for endocarditis. Once EF > 30%, can switch DOAC back to antiplatelet.   Encephalopathy vs. Functional status Altered mental status for 1 week prior to admission Still not follow commands, nonverbal at this time Observed spontaneous open eyes, looking around, watching RN hanging IV. However, not open eyes on request and actively resisting forced eye opening  Not following commands but able to sit at EOB independently Able to swallow liquid but spitting out solids EEG moderate diffuse encephalopathy, no seizure LP performed and CSF-not consistent with CNS infection De-escalate antibiotics per primary team  UTI SIRS Afebrile for the last 48 hours Tachycardia resolved Leukocytosis WBC 18.1-22.5-23.5- > 21.5 Blood culture so far negative Urine  culture ENTEROCOCCUS FAECALIS UA WBC 6-10 CSF no CNS infection TEE to rule out endocarditis -planned for 12/20 Antibiotics de-escalated to ampicillin  Hypertension Home meds:  amlodipine-benazepril 10-20 On the high end Off Cleviprex Amlodipine 10 mg restarted Keep BP <180/105, gradually normalize BP Long-term BP goal normotensive   Hyperlipidemia Home meds: none LDL 141, goal < 70 Add atorvastatin 80 mg High intensity statin to be initiated Continue statin at discharge  Abdominal soft tissue mass CT abdomen showed 4.7 cm rounded soft tissue structure interposed between the gastric fundus and the upper pole left kidney. Given location, gastric diverticulum, pancreatic tail mass, or exophytic renal mass could give this appearance. Nonemergent follow-up CT of the abdomen with IV and oral contrast is recommended. CCM on board  Other Stroke Risk Factors Obesity, Body mass index is 36.12 kg/m., BMI >/= 30 associated with increased stroke risk, recommend weight loss, diet and exercise as appropriate   Other Active Problems Chronic pain syndrome Anxiety   Hospital day # 7  Await TEE hopefully today i.  Continue amantadine dose to 150 mg twice daily to improve arousal.  Continue statin and aspirin for now.   Continue core track tube feeds as patient not swallowing enough.  Continue electrolyte replacement as per primary team discussed with Dr. Pietro Cassis.   Greater than 50% time during this 25-minute visit was spent in counseling and coordination of care and discussion patient and husband and answering questions.     Antony Contras, MD  stroke Neurology 08/06/2021 2:48 PM       To contact Stroke Continuity provider, please refer to [email or web address] After hours, contact General Neurology

## 2021-08-06 NOTE — Progress Notes (Signed)
Occupational Therapy Treatment Patient Details Name: Dorothy Alvarez MRN: [id number] DOB: [birth year] Today's Date: 08/06/2021   History of present illness 55 y/o female presented to [facility] ED on 12/13 for lethargy and confusion x 2 weeks. Found to have AKI, rhabdo, and emphysematous cystitis. MRI showed acute L PCA infarct, additional punctate areas of restricted diffusion in bilateral cerebral, R cerebellar, and medial R thalamus, and encephalomalacia in medial R occipital lobe, R posterior hippocampus, and L frontoparietal region. PMH: HTN, chronic pain syndrome, bipolar disorder, anxiety   OT comments  Patient received in supine with daughter in room. Patient was total assist to get from supine to sitting on EOB. Once on EOB patient required mod assist for sitting balance due to occasionally attempting to lunge forward and was min guard on other occasion.  Patient was max assist for hand over hand to wash face while on EOB. Patient was total assist to return to supine. Acute OT to continue to follow.    Recommendations for follow up therapy are one component of a multi-disciplinary discharge planning process, led by the attending physician.  Recommendations may be updated based on patient status, additional functional criteria and insurance authorization.    Follow Up Recommendations  Skilled nursing-short term rehab (<3 hours/day)    Assistance Recommended at Discharge Frequent or constant Supervision/Assistance  Equipment Recommendations  BSC/3in1;Wheelchair (measurements OT);Wheelchair cushion (measurements OT);Hospital bed    Recommendations for Other Services      Precautions / Restrictions Precautions Precautions: Fall Precaution Comments: cortrak, foley Restrictions Weight Bearing Restrictions: No       Mobility Bed Mobility Overal bed mobility: Needs Assistance Bed Mobility: Supine to Sit;Sit to Supine     Supine to sit: Total assist Sit to supine: Total assist   General  bed mobility comments: no participation from patient    Transfers                   General transfer comment: Unable     Balance Overall balance assessment: Needs assistance   Sitting balance-Leahy Scale: Fair Sitting balance - Comments: mod assist to min guard for sitting balance                                   ADL either performed or assessed with clinical judgement   ADL Overall ADL's : Needs assistance/impaired     Grooming: Wash/dry face;Maximal assistance;Sitting Grooming Details (indicate cue type and reason): hand over hand assist sitting on EOB                               General ADL Comments: max assist with hand over hand assist for washing face with RUE    Extremity/Trunk Assessment Upper Extremity Assessment Upper Extremity Assessment: Difficult to assess due to impaired cognition            Vision       Perception     Praxis      Cognition Arousal/Alertness: Lethargic Behavior During Therapy: Flat affect Overall Cognitive Status: Difficult to assess                                 General Comments: patient resistive to eye opening. Not following commands but moving spontaneously.          Exercises  Shoulder Instructions       General Comments      Pertinent Vitals/ Pain       Pain Assessment: Faces Faces Pain Scale: No hurt  Home Living                                          Prior Functioning/Environment              Frequency  Min 2X/week        Progress Toward Goals  OT Goals(current goals can now be found in the care plan section)  Progress towards OT goals: Progressing toward goals  Acute Rehab OT Goals Patient Stated Goal: family would like patient able to perform transfers to be able to return home OT Goal Formulation: With family Time For Goal Achievement: 08/16/21 Potential to Achieve Goals: Fair ADL Goals Pt Will Perform  Grooming: with max assist Additional ADL Goal #1: Pt will follow 50% of simple 1 step comands  Plan Discharge plan remains appropriate    Co-evaluation                 AM-PAC OT "6 Clicks" Daily Activity     Outcome Measure   Help from another person eating meals?: Total Help from another person taking care of personal grooming?: Total Help from another person toileting, which includes using toliet, bedpan, or urinal?: Total Help from another person bathing (including washing, rinsing, drying)?: Total Help from another person to put on and taking off regular upper body clothing?: Total Help from another person to put on and taking off regular lower body clothing?: Total 6 Click Score: 6    End of Session    OT Visit Diagnosis: Other abnormalities of gait and mobility (R26.89);Muscle weakness (generalized) (M62.81);Low vision, both eyes (H54.2);Other symptoms and signs involving cognitive function;Other symptoms and signs involving the nervous system (R29.898)   Activity Tolerance Patient limited by lethargy   Patient Left in bed;with call bell/phone within reach;with bed alarm set;with family/visitor present   Nurse Communication Mobility status        Time: 4696-2952 OT Time Calculation (min): 15 min  Charges: OT General Charges $OT Visit: 1 Visit OT Treatments $Therapeutic Activity: 8-22 mins  Lodema Hong, Shannon  Pager [phone] Office [place] 08/06/2021, 3:25 PM

## 2021-08-06 NOTE — Plan of Care (Signed)

## 2021-08-06 NOTE — Progress Notes (Signed)
Speech Language Pathology Treatment: Dysphagia  Patient Details Name: Dorothy Alvarez MRN: [id number] DOB: 1966-05-10 Today's Date: 08/06/2021 Time: 6203-5597 SLP Time Calculation (min) (ACUTE ONLY): 16 min  Assessment / Plan / Recommendation Clinical Impression  Pt was seen for dysphagia tx, NPO order still in place from initial plan for TEE this morning. Per chart and discussion with family, pt has not been eating and drinking with regular diet in place. During trials offered with SLP, pt shows consistent anticipation of bolus, opening her mouth with tactile stimulation of spoon/cup/straw hitting her lips as she keeps her eyes closed throughout the session. She automatically begins masticating whatever is put into her mouth, regardless of whether it be an ice chip, a straw, or a swab. As the session continued she did start to participate in self-feeding with hand-over-hand assist and this did facilitate improved awareness for cup drinking. Oral holding is noted at times with solids, but when it is, she responds well to liquid washes. She has a hard time stopping mastication once she starts, so she continues to chew even after the bolus is cleared from her oral cavity. Given that pt has not responded well to regular solid diet, will attempt a trial of more modified textures: Dys 2 diet and thin liquids. SLP provided education to family present and included pt's daughter during trials this session for hands on experience. Will continue to follow for swallowing - please also refer to speech/language evaluation for further recommendations.   HPI HPI: Pt presented to the ED at [facility] on 12/13 with progressive altered mental status and lethargy.  MRI was performed reveling and acute left PCA territory infarct, possibly embolic, including punctate areas of restricted diffusion in bilateral cerebral and right cerebellar hemisphere as well as medial right thalamus, encephalomalacia in medial right  occipital lobe, right posterior hippocampus and left frontoparietal region.  She was then transferred here for further evaluation.  EEG reveled no seizure activity.  Patient has required Cleviprex for BP control      SLP Plan  Continue with current plan of care      Recommendations for follow up therapy are one component of a multi-disciplinary discharge planning process, led by the attending physician.  Recommendations may be updated based on patient status, additional functional criteria and insurance authorization.    Recommendations  Diet recommendations: Dysphagia 2 (fine chop);Thin liquid Liquids provided via: Cup;Teaspoon;Straw Medication Administration: Crushed with puree Supervision: Staff to assist with self feeding;Full supervision/cueing for compensatory strategies Compensations: Slow rate;Small sips/bites Postural Changes and/or Swallow Maneuvers: Seated upright 90 degrees                Oral Care Recommendations: Oral care BID Follow Up Recommendations: Skilled nursing-short term rehab (<3 hours/day) (family wanting HH - will need 24/7 supervision if so) Assistance recommended at discharge: Frequent or constant Supervision/Assistance SLP Visit Diagnosis: Dysphagia, unspecified (R13.10) Plan: Continue with current plan of care           Osie Bond., M.A. [facility] Pager [phone] Office [phone]  08/06/2021, 5:01 PM

## 2021-08-06 NOTE — TOC Initial Note (Signed)
Transition of Care [facility]) - Initial/Assessment Note    Patient Details  Name: Dorothy Alvarez MRN: [id number] Date of Birth: 24-Oct-1965  Transition of Care [facility]) CM/SW Contact:    Geralynn Ochs, LCSW Phone Number: 08/06/2021, 11:21 AM  Clinical Narrative:           CSW met with daughter, Phineas Real, at bedside to discuss recommendation for SNF. Family is adamantly refusing putting the patient in a nursing home, they will take her home and provide care. CSW discussed that the patient will need 24/7 care, and daughter said they would figure it out. CSW also discussed DME and home health. Patient was previously independent, no DME at the home already. CSW discussed space for a hospital bed, and family will need to make space prior to delivery. Daughter was tearful during discussion, CSW provided support. Daughter said her dad may have questions, CSW told them to call back with any additional questions or concerns. CSW sent OT a message for home DME recommendations. CSW to follow.        Expected Discharge Plan: [place] Barriers to Discharge: Continued Medical Work up   Patient Goals and CMS Choice Patient states their goals for this hospitalization and ongoing recovery are:: patient unable to participate in goal setting, not oriented CMS Medicare.gov Compare Post Acute Care list provided to:: Patient Represenative (must comment) Choice offered to / list presented to : Adult Children  Expected Discharge Plan and Services Expected Discharge Plan: [place] Choice: Home Health, Durable Medical Equipment Living arrangements for the past 2 months: Single Family Home                                      Prior Living Arrangements/Services Living arrangements for the past 2 months: Single Family Home Lives with:: Spouse Patient language and need for interpreter reviewed:: No Do you feel safe going back to the place where you  live?: Yes      Need for Family Participation in Patient Care: Yes (Comment) Care giver support system in place?: Yes (comment)   Criminal Activity/Legal Involvement Pertinent to Current Situation/Hospitalization: No - Comment as needed  Activities of Daily Living      Permission Sought/Granted Permission sought to share information with : Facility Sport and exercise psychologist, Family Supports Permission granted to share information with : Yes, Verbal Permission Granted  Share Information with NAME: Dionne Milo  Permission granted to share info w AGENCY: HH, DME  Permission granted to share info w Relationship: Spouse, Daughter     Emotional Assessment   Attitude/Demeanor/Rapport: Unable to Assess Affect (typically observed): Unable to Assess Orientation: : Oriented to Self Alcohol / Substance Use: Not Applicable Psych Involvement: No (comment)  Admission diagnosis:  Confusion [R41.0] Acute encephalopathy [G93.40] Sepsis, due to unspecified organism, unspecified whether acute organ dysfunction present [facility]) [A41.9] Patient Active Problem List   Diagnosis Date Noted   Chronic combined systolic and diastolic CHF (congestive heart failure) ([place]) 07/31/2021   MDD (major depressive disorder) 07/31/2021   Acute encephalopathy 07/30/2021   Acute ischemic stroke ([place]) 07/30/2021   Rhabdomyolysis 07/30/2021   SIRS (systemic inflammatory response syndrome) ([place]) 07/30/2021   Mass of soft tissue of abdomen 07/30/2021   Acute CHF (congestive heart failure) ([place]) 05/01/2020   AKI (acute kidney injury) ([place]) 05/01/2020   BMI 37.0-37.9, adult 05/01/2020  Benzodiazepine dependence ([place]) 10/22/2018   Bipolar II disorder ([place])    MDD (major depressive disorder), recurrent episode, severe ([place]) 10/20/2018   Learning disability 06/25/2015   Illiterate 06/25/2015   Adjustment disorder with depressed mood 06/24/2014   Stress reaction causing mixed disturbance of emotion and conduct    Opiate  dependence, continuous ([place]) 06/16/2014   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   GAD (generalized anxiety disorder) 06/16/2014   Major depressive disorder, recurrent, severe without psychotic features ([place]) 06/16/2014   Major depressive disorder, recurrent episode, moderate ([place]) 04/13/2014   Insomnia 04/13/2014   DYSPNEA 07/05/2009   VAGINITIS 07/04/2009   OVARIAN CYST 07/04/2009   Essential hypertension 07/03/2009   CARDIOMEGALY 07/03/2009   PLEURAL EFFUSION 07/03/2009   DIVERTICULITIS, COLON 07/03/2009   PERSONAL HX COLONIC POLYPS 07/03/2009   PCP:  Nolene Ebbs, MD Pharmacy:   [id number] [place]Phone: [phone]: [phone] [id number] [address]: [phone]: [phone] [place] [place] [address]: [phone]: [phone]one: [phone]: [phone]   Social Determinants of Health (SDOH) Interventions    Readmission Risk Interventions No flowsheet data found.

## 2021-08-06 NOTE — Progress Notes (Signed)
Patient presents today for TEE to work-up the cause of her CVA.  She is currently not responsive, and in restraints. Was evaluated by Dr Roanna Banning in anesthesia, who expressed concern that she would not be able to protect her airway with sedation, and would likely require intubation.  I discussed this with Dr Pietro Cassis, and decision was made to hold off on TEE for now.  Can revisit as her mental status improves.  Donato Heinz, MD

## 2021-08-06 NOTE — Anesthesia Preprocedure Evaluation (Deleted)
Anesthesia Evaluation    Reviewed: Allergy & Precautions, Patient's Chart, lab work & pertinent test results  Airway        Dental   Pulmonary neg pulmonary ROS,           Cardiovascular hypertension, Pt. on medications      Neuro/Psych PSYCHIATRIC DISORDERS Anxiety Depression Bipolar Disorder negative neurological ROS     GI/Hepatic negative GI ROS, (+)     substance abuse  ,   Endo/Other  negative endocrine ROS  Renal/GU negative Renal ROS     Musculoskeletal  (+) Arthritis ,   Abdominal (+) + obese,   Peds  Hematology negative hematology ROS (+)   Anesthesia Other Findings BACTERIMIA  Reproductive/Obstetrics                             Anesthesia Physical Anesthesia Plan Anesthesia Quick Evaluation

## 2021-08-07 ENCOUNTER — Inpatient Hospital Stay (HOSPITAL_COMMUNITY): Payer: Medicare HMO

## 2021-08-07 DIAGNOSIS — R1310 Dysphagia, unspecified: Secondary | ICD-10-CM

## 2021-08-07 HISTORY — PX: IR GASTROSTOMY TUBE MOD SED: IMG625

## 2021-08-07 LAB — CBC WITH DIFFERENTIAL/PLATELET
Abs Immature Granulocytes: 0.04 10*3/uL (ref 0.00–0.07)
Basophils Absolute: 0 10*3/uL (ref 0.0–0.1)
Basophils Relative: 0 %
Eosinophils Absolute: 0.3 10*3/uL (ref 0.0–0.5)
Eosinophils Relative: 3 %
HCT: 43.7 % (ref 36.0–46.0)
Hemoglobin: 14.5 g/dL (ref 12.0–15.0)
Immature Granulocytes: 0 %
Lymphocytes Relative: 18 %
Lymphs Abs: 1.9 10*3/uL (ref 0.7–4.0)
MCH: 31.3 pg (ref 26.0–34.0)
MCHC: 33.2 g/dL (ref 30.0–36.0)
MCV: 94.4 fL (ref 80.0–100.0)
Monocytes Absolute: 1 10*3/uL (ref 0.1–1.0)
Monocytes Relative: 9 %
Neutro Abs: 7.5 10*3/uL (ref 1.7–7.7)
Neutrophils Relative %: 70 %
Platelets: 348 10*3/uL (ref 150–400)
RBC: 4.63 MIL/uL (ref 3.87–5.11)
RDW: 13.2 % (ref 11.5–15.5)
WBC: 10.8 10*3/uL — ABNORMAL HIGH (ref 4.0–10.5)
nRBC: 0 % (ref 0.0–0.2)

## 2021-08-07 LAB — GLUCOSE, CAPILLARY
Glucose-Capillary: 126 mg/dL — ABNORMAL HIGH (ref 70–99)
Glucose-Capillary: 128 mg/dL — ABNORMAL HIGH (ref 70–99)
Glucose-Capillary: 139 mg/dL — ABNORMAL HIGH (ref 70–99)
Glucose-Capillary: 145 mg/dL — ABNORMAL HIGH (ref 70–99)
Glucose-Capillary: 151 mg/dL — ABNORMAL HIGH (ref 70–99)
Glucose-Capillary: 153 mg/dL — ABNORMAL HIGH (ref 70–99)

## 2021-08-07 LAB — BASIC METABOLIC PANEL
Anion gap: 8 (ref 5–15)
BUN: 30 mg/dL — ABNORMAL HIGH (ref 6–20)
CO2: 28 mmol/L (ref 22–32)
Calcium: 8.8 mg/dL — ABNORMAL LOW (ref 8.9–10.3)
Chloride: 106 mmol/L (ref 98–111)
Creatinine, Ser: 0.99 mg/dL (ref 0.44–1.00)
GFR, Estimated: >60 mL/min (ref >60)
Glucose, Bld: 127 mg/dL — ABNORMAL HIGH (ref 70–99)
Potassium: 3.4 mmol/L — ABNORMAL LOW (ref 3.5–5.1)
Sodium: 142 mmol/L (ref 135–145)

## 2021-08-07 LAB — PROTIME-INR
INR: 1.1 (ref 0.8–1.2)
Prothrombin Time: 14.6 seconds (ref 11.4–15.2)

## 2021-08-07 LAB — CK: Total CK: 159 U/L (ref 38–234)

## 2021-08-07 MED ORDER — MIDAZOLAM HCL 2 MG/2ML IJ SOLN
INTRAMUSCULAR | Status: AC
  Filled 2021-08-07: qty 2

## 2021-08-07 MED ORDER — CEFAZOLIN SODIUM-DEXTROSE 2-4 GM/100ML-% IV SOLN
INTRAVENOUS | Status: AC | PRN
  Administered 2021-08-07: 2 g via INTRAVENOUS

## 2021-08-07 MED ORDER — LIDOCAINE HCL (PF) 1 % IJ SOLN
INTRAMUSCULAR | Status: AC | PRN
  Administered 2021-08-07: 5 mL

## 2021-08-07 MED ORDER — STERILE WATER FOR INJECTION IJ SOLN
INTRAMUSCULAR | Status: AC
  Filled 2021-08-07: qty 10

## 2021-08-07 MED ORDER — MIDAZOLAM HCL 2 MG/2ML IJ SOLN
INTRAMUSCULAR | Status: AC | PRN
  Administered 2021-08-07: .5 mg via INTRAVENOUS

## 2021-08-07 MED ORDER — SODIUM CHLORIDE 0.9 % IV SOLN: INTRAVENOUS | Status: DC | PRN

## 2021-08-07 MED ORDER — METHYLPHENIDATE HCL 5 MG PO TABS
5.0000 mg | ORAL_TABLET | Freq: Two times a day (BID) | ORAL | Status: DC
  Administered 2021-08-08 – 2021-08-11 (×8): 5 mg via ORAL
  Filled 2021-08-07 (×8): qty 1

## 2021-08-07 MED ORDER — LIDOCAINE-EPINEPHRINE 1 %-1:100000 IJ SOLN
INTRAMUSCULAR | Status: AC
  Filled 2021-08-07: qty 1

## 2021-08-07 NOTE — Progress Notes (Signed)
STROKE TEAM PROGRESS NOTE       INTERVAL HISTORY  Patient has multiple family members in the room.Marland Kitchen  Neurological exam remains unchanged  .vital signs stable.  She continues to keep her eyes forcibly closed and was not open eyes.  She does not follow commands for work a little bit with the therapist and participated.   TEE scheduled for later today as yesterday anesthesiologist was not comfortable doing it under conscious sedation with plans to do so today under general anesthesia Vitals:   08/06/21 2350 08/07/21 0350 08/07/21 0757 08/07/21 1219  BP: 124/77 (!) 159/90 (!) 177/99 128/80  Pulse: 95 80 86 76  Resp: 16 14 16 18   Temp: 99.1 F (37.3 C) 98.6 F (37 C) 98.6 F (37 C) 98.4 F (36.9 C)  TempSrc: Oral Oral Oral Oral  SpO2: 99% 100% 100% 100%  Weight:      Height:       CBC:  Recent Labs  Lab 08/06/21 0339 08/07/21 0254  WBC 11.8* 10.8*  NEUTROABS 8.1* 7.5  HGB 14.8 14.5  HCT 43.8 43.7  MCV 93.0 94.4  PLT 337 092   Basic Metabolic Panel:  Recent Labs  Lab 08/03/21 0501 08/03/21 1701 08/04/21 0640 08/06/21 0339 08/07/21 0254  NA 139  --    < > 144 142  K 3.2*  --    < > 3.4* 3.4*  CL 101  --    < > 104 106  CO2 29  --    < > 31 28  GLUCOSE 173*  --    < > 124* 127*  BUN 19  --    < > 21* 30*  CREATININE 0.89  --    < > 0.82 0.99  CALCIUM 8.2*  --    < > 9.1 8.8*  MG 2.3 2.2  --   --   --   PHOS 2.1* 3.1  --   --   --    < > = values in this interval not displayed.   Lipid Panel:  No results for input(s): CHOL, TRIG, HDL, CHOLHDL, VLDL, LDLCALC in the last 168 hours.  HgbA1c:  No results for input(s): HGBA1C in the last 168 hours.  Urine Drug Screen:  No results for input(s): LABOPIA, COCAINSCRNUR, LABBENZ, AMPHETMU, THCU, LABBARB in the last 168 hours.   Alcohol Level  No results for input(s): ETH in the last 168 hours.   IMAGING past 24 hours No results found.  PHYSICAL EXAM General:  Patient is a obese middle-aged African-American female  in no acute distress  Neuro:  Patient is lying in bed with eyes closed will moan in response to name but will not answer questions or follow commands.  Her eyes are tightly closed and she resists opening her eyes by examiner.  She does slightly open them occasionally.  She will move all four extremities spontaneously and purposefully against gravity but will not follow commands..  ASSESSMENT/PLAN [name] is a 55 y.o. female with history of anxiety, depression, HTN and chronic pain presenting with lethargy and altered mental status.  She presented to the ED at [facility] on 12/13 with progressive altered mental status and lethargy.  MRI was performed reveling and acute left PCA territory infarct, possibly embolic.  She was then transferred here for further evaluation.  EEG reveled no seizure activity.  Patient has required Cleviprex for BP control and was febrile yesterday to 101.1.  Plan is to obtain LP to  rule out infectious causes of her altered mental status.  TEE will be performed to rule out endocarditis.  Stroke: Multifocal infarcts including left PCA, right cerebellum, bilateral frontal and right thalamus likely embolic from endocarditis vs. cardiomyopathy with low EF CT head Chronic right occipital lobe infarct and degenerative changes in cervical spine. 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 Will recommend DOAC due to low EF if TEE negative for  endocarditis. Once EF > 30%, can switch DOAC back to antiplatelet.  Therapy recommendations:  SNF Disposition:  pending  CHF 04/2021 EF 25 to 30%.  Per discharge, patient discharged on Lasix 80, Entresto 49/51 twice daily, Coreg 3.125 twice daily, hydralazine 75 3 times daily and isosorbide 15 twice daily.  However, dose medication was not on patient medication list on admission, concerning for medication noncompliance. Patient also did not follow-up with cardiology, concerning for noncompliance This admission, EF 20% TEE pending 12/20 Will recommend DOAC due to low EF if TEE negative for endocarditis. Once EF > 30%, can switch DOAC back to antiplatelet.   Encephalopathy vs. Functional status Altered mental status for 1 week prior to admission Still not follow commands, nonverbal at this time Observed spontaneous open eyes, looking around, watching RN hanging IV. However, not open eyes on request and actively resisting forced eye opening  Not following commands but able to sit at EOB independently Able to swallow liquid but spitting out solids EEG moderate diffuse encephalopathy, no seizure LP performed and CSF-not consistent with CNS infection De-escalate antibiotics per primary team  UTI SIRS Afebrile for the last 48 hours Tachycardia resolved Leukocytosis WBC 18.1-22.5-23.5- > 21.5 Blood culture so far negative Urine culture ENTEROCOCCUS FAECALIS UA WBC 6-10 CSF no CNS infection TEE to rule out endocarditis -planned for 12/20 Antibiotics de-escalated to ampicillin  Hypertension Home meds:  amlodipine-benazepril 10-20 On the high end Off Cleviprex Amlodipine 10 mg restarted Keep BP <180/105, gradually normalize BP Long-term BP goal normotensive   Hyperlipidemia Home meds: none LDL 141, goal < 70 Add atorvastatin 80 mg High intensity statin to be initiated Continue statin at discharge  Abdominal soft tissue mass CT abdomen showed 4.7 cm rounded soft tissue structure  interposed between the gastric fundus and the upper pole left kidney. Given location, gastric diverticulum, pancreatic tail mass, or exophytic renal mass could give this appearance. Nonemergent follow-up CT of the abdomen with IV and oral contrast is recommended. CCM on board  Other Stroke Risk Factors Obesity, Body mass index is 36.12 kg/m., BMI >/= 30 associated with increased stroke risk, recommend weight loss, diet and exercise as appropriate   Other Active Problems Chronic pain syndrome Anxiety   Hospital day # 8  Await TEE hopefully today i.  Discontinue amantadine as it does not seem to be helping and instead tried Ritalin to provoke arousal.  Continue statin and aspirin for now.   Continue core track tube feeds as patient not swallowing enough.  Continue electrolyte replacement as per primary team discussed with Dr. Pietro Cassis.  Long discussion patient and  husband and multiple family members and answered questions Greater than 50% time during this 25-minute visit was spent in counseling and coordination of care and discussion patient and husband and answering questions.     Antony Contras, MD  stroke Neurology 08/07/2021 1:55 PM       To contact Stroke Continuity provider, please refer to [email or web address] After hours, contact General Neurology

## 2021-08-07 NOTE — Progress Notes (Signed)
PROGRESS NOTE  Dorothy Alvarez  DOB: 10/07/1965  PCP: Nolene Ebbs, MD IRJ:[id number]  DOA: 07/30/2021  LOS: 8 days  Hospital Day: 9  Chief Complaint  Patient presents with   lethargic   Emesis   Hypertension          Brief narrative: Dorothy Alvarez is a 55 y.o. female with PMH significant for HTN, chronic pain syndrome, bipolar disorder/depression  Patient was brought to the ED at [facility] on 12/13 with complaint of increasing somnolence, lethargy for last several days.  In the ER, she was noted to be febrile.   Labs showed elevated WBC count, AKI, rhabdomyolysis, lactic acidosis. CT renal study showed emphysematous cystitis and a 4.7 cm rounded soft tissue structure between the gastric fundus and left upper pole of kidney. 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   MRI of the brain showed a diffusion defect concerning for acute left PCA territory infarct.   Patient was started on IV fluid, IV antibiotics. Patient was admitted to hospitalist service for sepsis, stroke. She was transferred to [facility] for neurology consult.  On arrival to [facility], patient was noted to be critical and transferred to ICU service. Further work-up showed echo with a low LVEF of ~ 20%, LV with global hypokinesis, RV systolic function moderately reduced with RVSP ~65, LA mildly dilated, moderate to severe TVR.  With clinical improvement, patient was transferred out of ICU service to [facility] on 12/17. Patient's mental status has not improved for the last several days and remains comatose.  Subjective: Patient was seen and examined this morning.   Family not at bedside.  Patient grimaces on noxious stimulus.  Actively resists opening her eyes.  Does not respond to any other commands. Plan for PEG tube placement today.  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 -Urine culture sent on admission grew only 10,000 CFU per mL of Enterococcus faecalis. -To complete 7-day course of ampicillin IV on 12/22. -Currently has Foley catheter.  Unable to remove as her mental status remains very poor.  -Per PCCM, this was discussed with urology.  Bilateral multifocal stroke -Per neurology note, patient had multifocal infarcts including left PCA, right cerebellum, bilateral frontal and right thalamus.   -MRI did not show large vessel occlusion but 50% stenosis of left PCA -Carotid duplex with nonsignificant stenosis of bilateral carotids -Echo with EF 20% and no LV thrombus -A1c 6.4, LDL 141 -Currently patient is unable to follow any commands, has NG tube for feeding. -Prior to admission, patient was not on any antiplatelet/anticoagulant.  Per neurology recommendation, patient is currently on aspirin 325 mg.   -Once patient gets a PEG tube placed, will switch her to anticoagulation with DOAC. -Continue Lipitor 80 mg daily.  Dysphagia -Per speech therapy note from 12/16, patient opened eyes and passed speech therapy evaluation.  Regular thin liquid diet was recommended.   -Her mental status however since then has not improved.  She has barely been able to open eyes on command.  Spontaneous movement seen but unable to follow command to eat.  Currently she has ongoing NG tube feeding. -Plan for PEG tube placement today.  Acute metabolic encephalopathy  -multifactorial -bilateral multifocal stroke, polypharmacy at home, AKI with rhabdo, emphysematous cystitis, possible low flow state.  -Ammonia WNL, UDS neg, not hypercapnic on ABG, no evidence of seizure on EEG  -  lumbar puncture on 12/15 and CSF was sent for culture.  It showed predominantly mononuclear WBC but no organisms-not convincing for bacterial meningitis -Continue delirium precautions -Sedatives is on hold. -It is unclear at this time if patient is also showing any active  resistance to waking up.  She does not follow any commands but she grimaces on noxious stimulus.she actively resists when we will try to open her eyes. -She is on amantadine per neurology recommendation.   Acute on chronic biventricular heart failure  Essential hypertension -Echo with a EF less than 20%, global hypokinesis, no LV thrombus, elevated RV systolic pressure to 66. -Was on Cleviprex in ICU.  Weaned down. -Currently on Coreg, amlodipine, clonidine, Imdur.  I will hold amlodipine and plan to resume Entresto if blood pressure is stable tomorrow.  AKI -Creatinine was up at 1.73 on admission.  Improved with hydration.  Rhabdomyolysis -On admission, CK level was elevated over 8000, probably related to prolonged immobility, AKI. -Improved with hydration.  Continue tube feeding and hydration  Lactic acidosis -Lactic acid level trend as below, peaked at 5.3.  Gradually trended down and normalized.  Mild transaminitis -Unclear cause, may be related to right heart failure itself.  Improving. Recent Labs  Lab 07/31/21 1840 08/02/21 0432 08/04/21 0640  AST 73* 125* 65*  ALT 43 127* 95*  ALKPHOS 107 109 96  BILITOT 1.5* 0.8 0.8  PROT 6.6 7.4 6.2*  ALBUMIN 3.7 3.9 3.2*   Hypokalemia/hypophosphatemia -Potassium level remains low.  Daily replacement to continue. Recent Labs  Lab 08/01/21 0448 08/02/21 0432 08/02/21 1431 08/02/21 1749 08/03/21 0501 08/03/21 1701 08/04/21 0640 08/05/21 0322 08/06/21 0339 08/07/21 0254  K 3.6   < >  --   --  3.2*  --  3.1* 3.0* 3.4* 3.4*  MG 1.9  --  2.5* 2.4 2.3 2.2  --   --   --   --   PHOS  --   --  2.7 2.7 2.1* 3.1  --   --   --   --    < > = values in this interval not displayed.   Bipolar depression Chronic Pain  -No meds on hold   Abdominal Mass  -Incidental finding of 4.7 cm soft tissue mass on CT renal study done on admission. -Once her mental status and overall prognosis improves, we can consider obtaining CT abdomen pelvis  with IV and oral contrast.  At this time, it would not change the course of her illness.  Goals of care -Patient is not showing good neurological recovery. Discussed with family.  Palliative care consulted on 12/18.  Remains full code at this time.  Mobility: PT eval Living condition: Was living at home with husband Nutritional status: Body mass index is 36.12 kg/m.  Nutrition Problem: Inadequate oral intake Etiology: acute illness Signs/Symptoms: meal completion < 25% Diet:  Diet Order             DIET DYS 2 Room service appropriate? Yes; Fluid consistency: Thin  Diet effective now                  DVT prophylaxis:  Place and maintain sequential compression device Start: 08/01/21 1458   Antimicrobials: IV ampicillin till 12/24 Fluid: None Consultants: Neurology Family Communication: Called and updated patient's daughter and husband.  Status is: Inpatient  Continue in-hospital care because: Continues to need IV antibiotics, pending PEG tube placement Level of care: Progressive   Dispo: The patient is from: Home  Anticipated d/c is to: Unclear at this time.  Probably SNF after PEG tube placement              Patient currently is not medically stable to d/c.   Difficult to place patient No     Infusions:   sodium chloride 5 mL/hr at 08/07/21 1244   ampicillin (OMNIPEN) IV 2 g (08/07/21 1245)   feeding supplement (OSMOLITE 1.5 CAL) Stopped (08/06/21 2357)    Scheduled Meds:  atorvastatin  80 mg Per Tube Daily   carvedilol  6.25 mg Per Tube BID WC   chlorhexidine  15 mL Mouth Rinse BID   Chlorhexidine Gluconate Cloth  6 each Topical Daily   cloNIDine  0.1 mg Oral BID   feeding supplement (PROSource TF)  90 mL Per Tube BID   isosorbide dinitrate  10 mg Per Tube TID   mouth rinse  15 mL Mouth Rinse q12n4p   [START ON 08/08/2021] methylphenidate  5 mg Oral BID WC   potassium chloride  20 mEq Per Tube BID    PRN meds: sodium chloride,  acetaminophen **OR** acetaminophen (TYLENOL) oral liquid 160 mg/5 mL **OR** acetaminophen, hydrALAZINE, labetalol, ondansetron (ZOFRAN) IV   Antimicrobials: Anti-infectives (From admission, onward)    Start     Dose/Rate Route Frequency Ordered Stop   08/02/21 1000  vancomycin (VANCOREADY) IVPB 1250 mg/250 mL  Status:  Discontinued        1,250 mg 166.7 mL/hr over 90 Minutes Intravenous Every 24 hours 08/01/21 1008 08/02/21 1002   08/01/21 1115  ampicillin (OMNIPEN) 2 g in sodium chloride 0.9 % 100 mL IVPB        2 g 300 mL/hr over 20 Minutes Intravenous Every 6 hours 08/01/21 1023 08/08/21 1159   08/01/21 1100  cefTRIAXone (ROCEPHIN) 2 g in sodium chloride 0.9 % 100 mL IVPB  Status:  Discontinued        2 g 200 mL/hr over 30 Minutes Intravenous Every 12 hours 08/01/21 1000 08/02/21 1002   08/01/21 1100  vancomycin (VANCOREADY) IVPB 2000 mg/400 mL        2,000 mg 200 mL/hr over 120 Minutes Intravenous  Once 08/01/21 1007 08/01/21 1349   08/01/21 0800  cefTRIAXone (ROCEPHIN) 2 g in sodium chloride 0.9 % 100 mL IVPB  Status:  Discontinued        2 g 200 mL/hr over 30 Minutes Intravenous Every 24 hours 07/31/21 1354 08/01/21 1000   07/31/21 0800  cefTRIAXone (ROCEPHIN) 1 g in sodium chloride 0.9 % 100 mL IVPB  Status:  Discontinued        1 g 200 mL/hr over 30 Minutes Intravenous Every 24 hours 07/30/21 2349 07/31/21 1354   07/30/21 1915  ceFEPIme (MAXIPIME) 2 g in sodium chloride 0.9 % 100 mL IVPB        2 g 200 mL/hr over 30 Minutes Intravenous  Once 07/30/21 1907 07/30/21 2020   07/30/21 1900  vancomycin (VANCOCIN) IVPB 1000 mg/200 mL premix        1,000 mg 200 mL/hr over 60 Minutes Intravenous  Once 07/30/21 1847 07/30/21 2133       Objective: Vitals:   08/07/21 0757 08/07/21 1219  BP: (!) 177/99 128/80  Pulse: 86 76  Resp: 16 18  Temp: 98.6 F (37 C) 98.4 F (36.9 C)  SpO2: 100% 100%    Intake/Output Summary (Last 24 hours) at 08/07/2021 1359 Last data filed at  08/07/2021 1024 Gross per 24 hour  Intake 90 ml  Output 650 ml  Net -560 ml   Filed Weights   08/03/21 0500 08/04/21 0500 08/06/21 0500  Weight: 100.5 kg 99.1 kg 101.5 kg   Weight change:  Body mass index is 36.12 kg/m.   Physical Exam: General exam: Middle-aged African-American female. spontaneous movements seen but unable to follow commands.  Not in pain. Skin: No rashes, lesions or ulcers. HEENT: Atraumatic, normocephalic, no obvious bleeding.  Has NG tube in place Lungs: Diminished air entry in both bases CVS: Regular rate and rhythm, no murmur GI/Abd soft, nontender, nondistended, bowel sound present CNS: Eye movements seen under the eyelids but does not open eyes.  Actively resists when trying to open eyelids.  Spontaneous movement of extremities seen.  Grimaces to noxious stimuli Psychiatry: Sad affect Extremities: No pedal edema, no calf tenderness  Data Review: I have personally reviewed the laboratory data and studies available.  F/u labs ordered Unresulted Labs (From admission, onward)    None       Signed, Terrilee Croak, MD [facility] 08/07/2021

## 2021-08-07 NOTE — Progress Notes (Signed)
SLP Cancellation Note  Patient Details Name: Veanna Dower MRN: [id number] DOB: [birth year]   Cancelled treatment:       Reason Eval/Treat Not Completed: Patient at procedure or test/unavailable. Pt has been NPO today for PEG and TEE. Per RN, she just left the floor flor PEG. Will f/u on another date.    Osie Bond., M.A. [facility] Pager [phone] Office [phone]  08/07/2021, 4:27 PM

## 2021-08-07 NOTE — Sedation Documentation (Signed)
1 medication only, patient is responsive to painful stimuli only, does not follow commands.

## 2021-08-07 NOTE — Progress Notes (Signed)
Physical Therapy Treatment Patient Details Name: Dorothy Alvarez MRN: [id number] DOB: 1966/03/31 Today's Date: 08/07/2021   History of Present Illness 55 y/o female presented to [facility] ED on 12/13 for lethargy and confusion x 2 weeks. Found to have AKI, rhabdo, and emphysematous cystitis. MRI showed acute L PCA infarct, additional punctate areas of restricted diffusion in bilateral cerebral, R cerebellar, and medial R thalamus, and encephalomalacia in medial R occipital lobe, R posterior hippocampus, and L frontoparietal region. PMH: HTN, chronic pain syndrome, bipolar disorder, anxiety    PT Comments    Focus of session was transfer to EOB and trunk stabilization activity. Pt tolerated ~15 minutes EOB and progressed to min guard assist for balance support. Pt accepted trunk perturbations anterior/posterior and laterally, eventually anticipating the perturbations and activating core to keep herself stable without cues. Eyes closed throughout session however attempting to open when offered to look at pictures of her grandsons, Dorothy Alvarez and Dorothy Alvarez. Will continue to follow and progress as able per POC.    Recommendations for follow up therapy are one component of a multi-disciplinary discharge planning process, led by the attending physician.  Recommendations may be updated based on patient status, additional functional criteria and insurance authorization.  Follow Up Recommendations  Skilled nursing-short term rehab (<3 hours/day)     Assistance Recommended at Discharge Frequent or constant Supervision/Assistance  Equipment Recommendations  Other (comment) (TBD)    Recommendations for Other Services       Precautions / Restrictions Precautions Precautions: Fall Precaution Comments: cortrak, foley Restrictions Weight Bearing Restrictions: No     Mobility  Bed Mobility Overal bed mobility: Needs Assistance Bed Mobility: Supine to Sit;Sit to Supine     Supine to sit: Max assist Sit to supine:  Max assist   General bed mobility comments: Minimal participation from pt with +2 assist required from therapist and tech for safe transition to/from EOB. Activaton of core for controlled lower back to supine at end of session.    Transfers                   General transfer comment: Did not attempt    Ambulation/Gait               General Gait Details: Unable   Stairs             Wheelchair Mobility    Modified Rankin (Stroke Patients Only) Modified Rankin (Stroke Patients Only) Pre-Morbid Rankin Score: No symptoms Modified Rankin: Severe disability     Balance Overall balance assessment: Needs assistance   Sitting balance-Leahy Scale: Fair Sitting balance - Comments: Progressed to min guard assist by end of session                                    Cognition Arousal/Alertness: Lethargic Behavior During Therapy: Flat affect Overall Cognitive Status: Difficult to assess                                          Exercises      General Comments        Pertinent Vitals/Pain Pain Assessment: Faces Faces Pain Scale: No hurt Pain Intervention(s): Monitored during session    Home Living  Prior Function            PT Goals (current goals can now be found in the care plan section) Acute Rehab PT Goals Patient Stated Goal: Unable to state this session PT Goal Formulation: With family Time For Goal Achievement: 08/16/21 Potential to Achieve Goals: Fair Progress towards PT goals: Progressing toward goals    Frequency    Min 3X/week      PT Plan Current plan remains appropriate    Co-evaluation              AM-PAC PT "6 Clicks" Mobility   Outcome Measure  Help needed turning from your back to your side while in a flat bed without using bedrails?: Total Help needed moving from lying on your back to sitting on the side of a flat bed without using bedrails?:  Total Help needed moving to and from a bed to a chair (including a wheelchair)?: Total Help needed standing up from a chair using your arms (e.g., wheelchair or bedside chair)?: Total Help needed to walk in hospital room?: Total Help needed climbing 3-5 steps with a railing? : Total 6 Click Score: 6    End of Session Equipment Utilized During Treatment: Oxygen Activity Tolerance: Patient tolerated treatment well Patient left: in bed;with call bell/phone within reach;with bed alarm set;with family/visitor present Nurse Communication: Mobility status PT Visit Diagnosis: Muscle weakness (generalized) (M62.81);Unsteadiness on feet (R26.81);Other abnormalities of gait and mobility (R26.89);Other symptoms and signs involving the nervous system (R29.898)     Time: 6060-0459 PT Time Calculation (min) (ACUTE ONLY): 24 min  Charges:  $Therapeutic Activity: 23-37 mins                     Rolinda Roan, PT, DPT Acute Rehabilitation Services Pager: [phone] Office: [phone]    Thelma Comp 08/07/2021, 1:17 PM

## 2021-08-07 NOTE — Progress Notes (Signed)
Hospital bed:  Length of Need 6 Months  The above medical condition requires: Patient requires the ability to reposition frequently  Head must be elevated greater than: 30 degrees  Bed type Semi-electric  Hoyer Lift Yes  Support Surface: Gel Overlay   Wheelchair:   Patient suffers from stroke which impairs their ability to perform daily activities like bathing, dressing, grooming, and toileting in the home.  A walker will not resolve  issue with performing activities of daily living. A wheelchair will allow patient to safely perform daily activities. Patient is not able to propel themselves in the home using a standard weight wheelchair due to general weakness. Patient can self propel in the lightweight wheelchair. Length of need lifetime.  Accessories: elevating leg rests (ELRs), wheel locks, extensions and anti-tippers.

## 2021-08-07 NOTE — Progress Notes (Signed)
Daily Progress Note   [name]       Date: 08/07/2021 DOB: 11-Mar-1966  Age: 55 y.o. MRN#: [id number] Attending Physician: Terrilee Croak, MD Primary Care Physician: Nolene Ebbs, MD Admit Date: 07/30/2021 Length of Stay: 8 days  Reason for Consultation/Follow-up: Establishing goals of care  HPI/Patient Profile:  55 y.o. female  with past medical history of HTN, chronic pain syndrome, bipolar disorder/depression. She was brought to the ED at [facility] on 12/13 with complaint of increasing somnolence, lethargy for last several days.  Work-up in the emergency department found febrile illness with elevated WBC count, AKI, rhabdomyolysis, lactic acidosis.  CT renal study showed emphysematous cystitis and a 4.7 cm soft tissue structure between the gastric fundus and left upper pole of the kidney.  She was admitted on 07/30/2021 with severe sepsis due to emphysematous cystitis, bilateral multifocal stroke, dysphagia, metabolic encephalopathy, acute on chronic biventricular heart failure, AKI, rhabdomyolysis, lactic acidosis, abdominal mass, and others.    PMT was consulted for goals of care conversation.  Subjective:   Subjective: Chart Reviewed. Updates received. Patient Assessed. Created space and opportunity for patient  and family to explore thoughts and feelings regarding current medical situation.  Today's Discussion: I met with the patient's family at the bedside including her husband, daughter, and another individual.  We had an extensive discussion regarding her current care.  They expressed frustration at feeling like "were being pushed out" and the lack of communication.  They feel that communication they are receiving is always negative and never any help.  They share that the patient was able to balance at the edge of the bed with physical therapy today.  She was able to drink from a straw and take small bites off a spoon.  However, her eyes remain closed and she is not  interactive.  They state that neurology said they were going to change some of her medications but they are not sure when this will happen.  I confirmed that her medications are to be changed and first dose of Ritalin scheduled for tomorrow morning.  They shared their reviews about feeling unprepared to take the patient home.  They found out today that the hospital doctor said that she will discharge on Friday.  I shared that she will not discharge until it is safe to do so and she is medically stable.  This seemed to provide some comfort.  I shared that case management will work on support at home such as PT, OT, nursing, DME.  I shared that there would be training and education to help teach them how to care for her appropriately and safely at home.  I tried to provide as much reassurance as I could.  They seem to be comforted by this.  I reconfirmed goals of care which are to not go to a nursing facility but rather to receive care at home by family and friends.  I provided emotional and general support through therapeutic listening, empathy, sharing of stories, and other techniques.  I answered all questions and addressed all concerns to the best of my ability.  Review of Systems  Unable to perform ROS: Patient unresponsive   Objective:   Vital Signs:  BP 128/80 (BP Location: Left Arm)    Pulse 76    Temp 98.4 F (36.9 C) (Oral)    Resp 18    Ht _0  (1.676 m)    Wt 101.5 kg    SpO2 100%    BMI  36.12 kg/m   Physical Exam: Physical Exam Vitals and nursing note reviewed.  Constitutional:      Comments: Not opening eyes  HENT:     Head: Normocephalic and atraumatic.  Cardiovascular:     Rate and Rhythm: Normal rate.  Pulmonary:     Effort: Pulmonary effort is normal. No respiratory distress.     Breath sounds: No wheezing or rhonchi.  Abdominal:     General: Abdomen is flat.     Palpations: Abdomen is soft.  Skin:    General: Skin is warm and dry.  I now points since  Palliative  Assessment/Data: 30-40% (planned PEG tube)   Assessment & Plan:   Impression: Present on Admission:  Acute encephalopathy  Acute ischemic stroke (HCC)  (Resolved) Sepsis ([place])  AKI (acute kidney injury) ([place])  Rhabdomyolysis  Essential hypertension  SIRS (systemic inflammatory response syndrome) (HCC)  Mass of soft tissue of abdomen  Chronic combined systolic and diastolic CHF (congestive heart failure) (HCC)  Bipolar II disorder (HCC)  MDD (major depressive disorder)  55 year old female with multiple acute on chronic comorbidities as described in consult note.  Her biggest issues currently are acute on chronic biventricular failure with an EF estimated to be less than 20%, although a TEE is planned for (has been cancelled x 1-2 times).  Also a major driving focus is bilateral multifocal pneumonia essentially unresponsive, not following commands but does move extremities spontaneously, sat edge of bed and took a few bites of food today. Deemed overall poor prognosis.  NG tube in place, PEG tube planned.  SUMMARY OF RECOMMENDATIONS   Continue full code Continue full scope of treatment Planned PEG tube Planned TEE when able Work with social work to arrange adequate home support PMT will continue to follow  Code Status: Full code  Prognosis: Unable to determine  Discharge Planning: To Be Determined  Discussed with: Medical team, nursing team, patient family, [facility] team  Thank you for allowing [place] to participate in the care of Dorothy Alvarez PMT will continue to support holistically.  Time Total: 75 min  Visit consisted of counseling and education dealing with the complex and emotionally intense issues of symptom management and palliative care in the setting of serious and potentially life-threatening illness. Greater than 50%  of this time was spent counseling and coordinating care related to the above assessment and plan.  Walden Field, NP Palliative Medicine Team  Team Phone #  [phone] (Nights/Weekends)  04/16/2021, 8:17 AM

## 2021-08-07 NOTE — Procedures (Addendum)
Interventional Radiology Procedure Note  Procedure: Placement of percutaneous 47F balloon retention gastrostomy tube.  Complications: None  Recommendations: - NPO except for sips and chips remainder of today and overnight - Maintain G-tube to LWS until tomorrow morning  - May advance diet as tolerated and begin using tube tomorrow morning   Ruthann Cancer, MD Pager: [phone]

## 2021-08-08 LAB — CBC WITH DIFFERENTIAL/PLATELET
Abs Immature Granulocytes: 0.04 10*3/uL (ref 0.00–0.07)
Basophils Absolute: 0.1 10*3/uL (ref 0.0–0.1)
Basophils Relative: 1 %
Eosinophils Absolute: 0.1 10*3/uL (ref 0.0–0.5)
Eosinophils Relative: 1 %
HCT: 41.6 % (ref 36.0–46.0)
Hemoglobin: 14.5 g/dL (ref 12.0–15.0)
Immature Granulocytes: 0 %
Lymphocytes Relative: 13 %
Lymphs Abs: 1.4 10*3/uL (ref 0.7–4.0)
MCH: 32.2 pg (ref 26.0–34.0)
MCHC: 34.9 g/dL (ref 30.0–36.0)
MCV: 92.2 fL (ref 80.0–100.0)
Monocytes Absolute: 0.7 10*3/uL (ref 0.1–1.0)
Monocytes Relative: 7 %
Neutro Abs: 7.9 10*3/uL — ABNORMAL HIGH (ref 1.7–7.7)
Neutrophils Relative %: 78 %
Platelets: 360 10*3/uL (ref 150–400)
RBC: 4.51 MIL/uL (ref 3.87–5.11)
RDW: 12.8 % (ref 11.5–15.5)
WBC: 10.2 10*3/uL (ref 4.0–10.5)
nRBC: 0 % (ref 0.0–0.2)

## 2021-08-08 LAB — GLUCOSE, CAPILLARY
Glucose-Capillary: 127 mg/dL — ABNORMAL HIGH (ref 70–99)
Glucose-Capillary: 149 mg/dL — ABNORMAL HIGH (ref 70–99)
Glucose-Capillary: 150 mg/dL — ABNORMAL HIGH (ref 70–99)
Glucose-Capillary: 163 mg/dL — ABNORMAL HIGH (ref 70–99)
Glucose-Capillary: 200 mg/dL — ABNORMAL HIGH (ref 70–99)
Glucose-Capillary: 208 mg/dL — ABNORMAL HIGH (ref 70–99)
Glucose-Capillary: 232 mg/dL — ABNORMAL HIGH (ref 70–99)

## 2021-08-08 LAB — MAGNESIUM: Magnesium: 2.3 mg/dL (ref 1.7–2.4)

## 2021-08-08 LAB — BASIC METABOLIC PANEL
Anion gap: 9 (ref 5–15)
BUN: 23 mg/dL — ABNORMAL HIGH (ref 6–20)
CO2: 26 mmol/L (ref 22–32)
Calcium: 8.9 mg/dL (ref 8.9–10.3)
Chloride: 109 mmol/L (ref 98–111)
Creatinine, Ser: 0.87 mg/dL (ref 0.44–1.00)
GFR, Estimated: >60 mL/min (ref >60)
Glucose, Bld: 160 mg/dL — ABNORMAL HIGH (ref 70–99)
Potassium: 3.6 mmol/L (ref 3.5–5.1)
Sodium: 144 mmol/L (ref 135–145)

## 2021-08-08 LAB — PHOSPHORUS: Phosphorus: 2.8 mg/dL (ref 2.5–4.6)

## 2021-08-08 MED ORDER — PANTOPRAZOLE SODIUM 40 MG PO TBEC
40.0000 mg | DELAYED_RELEASE_TABLET | Freq: Every day | ORAL | Status: DC
  Administered 2021-08-08 – 2021-08-11 (×4): 40 mg via ORAL
  Filled 2021-08-08 (×4): qty 1

## 2021-08-08 MED ORDER — APIXABAN 5 MG PO TABS
5.0000 mg | ORAL_TABLET | Freq: Two times a day (BID) | ORAL | Status: DC
  Administered 2021-08-08 – 2021-08-11 (×7): 5 mg via ORAL
  Filled 2021-08-08 (×7): qty 1

## 2021-08-08 MED ORDER — OSMOLITE 1.5 CAL PO LIQD
355.0000 mL | Freq: Four times a day (QID) | ORAL | Status: DC
  Administered 2021-08-08 – 2021-08-14 (×24): 355 mL

## 2021-08-08 NOTE — Progress Notes (Signed)
Dorothy Alvarez is a 55 y.o. female s/p image guided gastrostomy tube placement with Dr. Serafina Royals yesterday.   G tube site with minimal old, clotted blood. No active bleeding or drainage noted.  Two T tacks present, will fall off spontaneously or will be removed by IR APP on/after January 3rd.   Ok to use the gastrostomy tube.   Please call IR for questions and concerns regarding the G tube.    Dorothy Alvarez H Freman Lapage PA-C 08/08/2021 2:00 PM

## 2021-08-08 NOTE — Plan of Care (Signed)
°  Problem: Education: Goal: Knowledge of General Education information will improve Description: Including pain rating scale, medication(s)/side effects and non-pharmacologic comfort measures 08/08/2021 1927 by Dorris Carnes, RN Outcome: Progressing 08/08/2021 1926 by Dorris Carnes, RN Outcome: Progressing

## 2021-08-08 NOTE — TOC Progression Note (Signed)
Transition of Care [facility]) - Progression Note    Patient Details  Name: Dorothy Alvarez MRN: [id number] Date of Birth: 1966-02-27  Transition of Care [facility]) CM/SW [place], [place] Phone Number: 08/08/2021, 4:29 PM  Clinical Narrative:   CSW met with patient, spouse, daughter at bedside to discuss going home with home health. Family frustrated that they feel like they're being pushed out when they're not ready. CSW answered questions and discussed education needs and equipment delivery that needs to happen prior to discharge. Patient will need hospital bed, wheelchair, 3N1, and hoyer lift. Patient will need home health services, as well. CSW provided CMS choice list to patient's family to review home health agencies. Family asked for nobody to contact them today but to contact them tomorrow about DME and education. CSW to follow.    Expected Discharge Plan: [place] Barriers to Discharge: Continued Medical Work up  Expected Discharge Plan and Services Expected Discharge Plan: [place] Choice: Home Health, Durable Medical Equipment Living arrangements for the past 2 months: Single Family Home                                       Social Determinants of Health (SDOH) Interventions    Readmission Risk Interventions No flowsheet data found.

## 2021-08-08 NOTE — Progress Notes (Signed)
STROKE TEAM PROGRESS NOTE       INTERVAL HISTORY  Patient has multiple family members in the room.Marland Kitchen  Neurological exam shows some improvement for the patient now spontaneously opening eyes partially at least during my exam.  She will try to follow some simple midline commands and moves extremities purposefully on command..vital signs stable.  TEE was canceled patient will not be able to protect her airway.  She is scheduled to undergo PEG tube today as she is not getting enough. Vitals:   08/07/21 2348 08/08/21 0352 08/08/21 0824 08/08/21 1223  BP: (!) 168/103 (!) 157/103 (!) 189/109 135/87  Pulse: 81 85 88 83  Resp: 19 15 16 16   Temp: 98.4 F (36.9 C) 98.6 F (37 C) 98.9 F (37.2 C) (!) 97.5 F (36.4 C)  TempSrc: Oral Oral Oral Axillary  SpO2: 100% 99% 97% 96%  Weight:      Height:       CBC:  Recent Labs  Lab 08/07/21 0254 08/08/21 1200  WBC 10.8* 10.2  NEUTROABS 7.5 7.9*  HGB 14.5 14.5  HCT 43.7 41.6  MCV 94.4 92.2  PLT 348 229   Basic Metabolic Panel:  Recent Labs  Lab 08/03/21 1701 08/04/21 0640 08/07/21 0254 08/08/21 1200  NA  --    < > 142 144  K  --    < > 3.4* 3.6  CL  --    < > 106 109  CO2  --    < > 28 26  GLUCOSE  --    < > 127* 160*  BUN  --    < > 30* 23*  CREATININE  --    < > 0.99 0.87  CALCIUM  --    < > 8.8* 8.9  MG 2.2  --   --  2.3  PHOS 3.1  --   --  2.8   < > = values in this interval not displayed.   Lipid Panel:  No results for input(s): CHOL, TRIG, HDL, CHOLHDL, VLDL, LDLCALC in the last 168 hours.  HgbA1c:  No results for input(s): HGBA1C in the last 168 hours.  Urine Drug Screen:  No results for input(s): LABOPIA, COCAINSCRNUR, LABBENZ, AMPHETMU, THCU, LABBARB in the last 168 hours.   Alcohol Level  No results for input(s): ETH in the last 168 hours.   IMAGING past 24 hours IR GASTROSTOMY TUBE MOD SED  Result Date: 08/08/2021 INDICATION: 55 year old female with history of stroke and dysphagia. EXAM: PERC PLACEMENT  GASTROSTOMY MEDICATIONS: Ancef 2 gm IV; Antibiotics were administered within 1 hour of the procedure. ANESTHESIA/SEDATION: Versed 0.5 mg IV; Fentanyl 0 mcg IV Moderate Sedation Time:  0 The patient was continuously monitored during the procedure by the interventional radiology nurse under my direct supervision. CONTRAST:  20 mL Omnipaque 300-administered into the gastric lumen. FLUOROSCOPY TIME:  Fluoroscopy Time: 0 minutes 36 seconds (4 mGy). COMPLICATIONS: None immediate. PROCEDURE: Informed written consent was obtained from the patient after a thorough discussion of the procedural risks, benefits and alternatives. All questions were addressed. Maximal Sterile barrier Technique was utilized including caps, mask, sterile gowns, sterile gloves, sterile drape, hand hygiene and skin antiseptic. A timeout was performed prior to the initiation of the procedure. The patient was placed on the procedure table in the supine position. Pre-procedure abdominal film confirmed visualization of the transverse colon. The patient was prepped and draped in usual sterile fashion. The stomach was insufflated with air via the indwelling nasogastric tube. Under fluoroscopy, a  puncture site was selected and local analgesia achieved with 1% lidocaine infiltrated subcutaneously. Under fluoroscopic guidance, a gastropexy needle was passed into the stomach and the T-bar suture was released. Entry into the stomach was confirmed with fluoroscopy, aspiration of air, and injection of contrast material. This was repeated with an additional gastropexy suture (for a total of 2 fasteners). At the center of these gastropexy sutures, a dermatotomy was performed. An 18 gauge needle was passed into the stomach at the site of this dermatotomy, and position within the gastric lumen again confirmed under fluoroscopy using aspiration of air and contrast injection. An Amplatz guidewire was passed through this needle and intraluminal placement within the  stomach was confirmed by fluoroscopy. The needle was removed. Over the guidewire, the percutaneous tract was dilated using a 10 mm non-compliant balloon. The balloon was deflated, then pushed into the gastric lumen followed in concert by the 22 [place] gastrostomy tube. The retention balloon of the percutaneous gastrostomy tube was inflated with 20 mL of sterile water. The tube was withdrawn until the retention balloon was at the edge of the gastric lumen. The external bumper was brought to the abdominal wall. Contrast was injected through the gastrostomy tube, confirming intraluminal positioning. The patient tolerated the procedure well without any immediate post-procedural complications. IMPRESSION: Technically successful placement of 22 Fr gastrostomy tube. Ruthann Cancer, MD [facility] Electronically Signed   By: Ruthann Cancer M.D.   On: 08/08/2021 07:39    PHYSICAL EXAM General:  Patient is a obese middle-aged African-American female in no acute distress  Neuro:  Patient is lying in bed with eyes closed but does strike to open them partially in response to name but will not answer questions or follow commands.  She does slightly open them occasionally.  She will move all four extremities spontaneously and purposefully against gravity but will not follow commands..  ASSESSMENT/PLAN Ms. Enisa Runyan is a 55 y.o. female with history of anxiety, depression, HTN and chronic pain presenting with lethargy and altered mental status.  She presented to the ED at [facility] on 12/13 with progressive altered mental status and lethargy.  MRI was performed reveling and acute left PCA territory infarct, possibly embolic.  She was then transferred here for further evaluation.  EEG reveled no seizure activity.  Patient has required Cleviprex for BP control and was febrile yesterday to 101.1.  Plan is to obtain LP to rule out infectious causes of her  altered mental status.  TEE will be performed to rule out endocarditis.  Stroke: Multifocal infarcts including left PCA, right cerebellum, bilateral frontal and right thalamus likely embolic from endocarditis vs. cardiomyopathy with low EF CT head Chronic right occipital lobe infarct and degenerative changes in cervical spine. 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 Will recommend DOAC due to low EF if TEE negative for endocarditis. Once EF > 30%, can switch DOAC back to antiplatelet.  Therapy recommendations:  SNF Disposition:  pending  CHF 04/2021 EF 25 to 30%.  Per discharge, patient discharged on Lasix 80, Entresto 49/51 twice daily, Coreg 3.125 twice daily, hydralazine 75 3 times daily and isosorbide 15 twice daily.  However, dose medication was not on patient medication list on admission, concerning for medication noncompliance. Patient also did not follow-up with cardiology, concerning for noncompliance This admission, EF 20% TEE pending 12/20 Will recommend DOAC due to low EF if TEE negative for endocarditis. Once EF > 30%, can switch DOAC back to antiplatelet.   Encephalopathy secondary to multifocal infarcts  altered mental status for 1 week prior to admission Still not follow commands, nonverbal at this time Observed spontaneous open eyes, looking around, watching RN hanging IV. However, not open eyes on request and actively resisting forced eye opening  Not  following commands but able to sit at EOB independently Able to swallow liquid but spitting out solids EEG moderate diffuse encephalopathy, no seizure LP performed and CSF-not consistent with CNS infection De-escalate antibiotics per primary team  UTI SIRS Afebrile for the last 48 hours Tachycardia resolved Leukocytosis WBC 18.1-22.5-23.5- > 21.5 Blood culture so far negative Urine culture ENTEROCOCCUS FAECALIS UA WBC 6-10 CSF no CNS infection TEE to rule out endocarditis -planned for 12/20 Antibiotics de-escalated to ampicillin  Hypertension Home meds:  amlodipine-benazepril 10-20 On the high end Off Cleviprex Amlodipine 10 mg restarted Keep BP <180/105, gradually normalize BP Long-term BP goal normotensive   Hyperlipidemia Home meds: none LDL 141, goal < 70 Add atorvastatin 80 mg High intensity statin to be initiated Continue statin at discharge  Abdominal soft tissue mass CT abdomen showed 4.7 cm rounded soft tissue structure interposed between the gastric fundus and the upper pole left kidney. Given location, gastric diverticulum, pancreatic tail mass, or exophytic renal mass could give this appearance. Nonemergent follow-up CT of the abdomen with IV and oral contrast is recommended. CCM on board  Other Stroke Risk Factors Obesity, Body mass index is 36.12 kg/m., BMI >/= 30 associated with increased stroke risk, recommend weight loss, diet and exercise as appropriate   Other Active Problems Chronic pain syndrome Anxiety   Hospital day # 9  Agree with canceling TEE.  Continue Ritalin twice daily as there may have been some response patient being more awake and interactive.  Continue statin and aspirin for now.   PEG tube today by interventional radiology..  Continue electrolyte replacement as per primary team discussed with Dr. Pietro Cassis.  Long discussion patient and husband and multiple family members and answered questions.  Stroke team will sign off.  Kindly call  for questions. Greater than 50% time during this 25-minute visit was spent in counseling and coordination of care and discussion patient and husband and answering questions.     Antony Contras, MD  stroke Neurology 08/08/2021 1:06 PM       To contact Stroke Continuity provider, please refer to [email or web address] After hours, contact General Neurology

## 2021-08-08 NOTE — Progress Notes (Signed)
Daily Progress Note   Patient Name: Taje Tondreau       Date: 08/08/2021 DOB: 08-31-1965  Age: 55 y.o. MRN#: [id number] Attending Physician: Terrilee Croak, MD Primary Care Physician: Nolene Ebbs, MD Admit Date: 07/30/2021 Length of Stay: 9 days  Reason for Consultation/Follow-up: Establishing goals of care  HPI/Patient Profile:  55 y.o. female  with past medical history of HTN, chronic pain syndrome, bipolar disorder/depression. She was brought to the ED at [facility] on 12/13 with complaint of increasing somnolence, lethargy for last several days.  Work-up in the emergency department found febrile illness with elevated WBC count, AKI, rhabdomyolysis, lactic acidosis.  CT renal study showed emphysematous cystitis and a 4.7 cm soft tissue structure between the gastric fundus and left upper pole of the kidney.  She was admitted on 07/30/2021 with severe sepsis due to emphysematous cystitis, bilateral multifocal stroke, dysphagia, metabolic encephalopathy, acute on chronic biventricular heart failure, AKI, rhabdomyolysis, lactic acidosis, abdominal mass, and others.    PMT was consulted for goals of care conversation.  Subjective:   Subjective: Chart Reviewed. Updates received. Patient Assessed. Created space and opportunity for patient  and family to explore thoughts and feelings regarding current medical situation.  Today's Discussion: I met at the bedside with the patient (not interactive), the patient's husband Archie, and her mom. Arching began feeding the patient and she is taking bites off the spoon, chewing, and swallowing. He is diligently checking for residual in between bites. Patient not opening eyes, not speaking.  Discussed progress of preparations for home. Archie expressed frustration and being told "she has to be out by Saturday" which was a sudden short notice. DME is trying to arrange delivery of hospital bed, but they are working to move stuff out of the home to accommodate.  He isn't sure if they'll be able to get all preparations done for her return home in time (given forecasted severe winter weather and holiday weekend). Discussed other outstanding needs, particularly education/teaching about how to care for her at home, PT/OT, [place], etc.  They feel overwhelmed and unprepared. I reassured them d/c cannot happen until there is a safe d/c plan in place. I offered to continue to follow-up with them and provide family support, which they accepted. I provided emotional and general support through therapeutic listening, empathy, reminiscing, and other techniques. I answered all questions and address all concerns to the best of our ability.  Review of Systems  Unable to perform ROS: Other (Patient not interactive)  Objective:   Vital Signs:  BP 135/87 (BP Location: Left Arm)    Pulse 83    Temp (!) 97.5 F (36.4 C) (Axillary)    Resp 16    Ht $R'5\' 6"'UB$  (1.676 m)    Wt 101.5 kg    SpO2 96%    BMI 36.12 kg/m   Physical Exam: Physical Exam Vitals and nursing note reviewed.  Constitutional:      General: She is not in acute distress.    Comments: Eyes remain closed; taking bites of food without issue  HENT:     Head: Normocephalic and atraumatic.  Cardiovascular:     Rate and Rhythm: Normal rate.  Pulmonary:     Effort: No respiratory distress.     Breath sounds: No wheezing or rhonchi.  Abdominal:     General: Abdomen is flat.  Skin:    General: Skin is warm and dry.    Palliative Assessment/Data: 40-50%   Assessment & Plan:   Impression:  Present on Admission:  Acute encephalopathy  Acute ischemic stroke (HCC)  (Resolved) Sepsis ([place])  AKI (acute kidney injury) ([place])  Rhabdomyolysis  Essential hypertension  SIRS (systemic inflammatory response syndrome) (HCC)  Mass of soft tissue of abdomen  Chronic combined systolic and diastolic CHF (congestive heart failure) (HCC)  Bipolar II disorder (HCC)  MDD (major depressive disorder)  55 year old  female with multiple acute on chronic comorbidities as described in consult note.  Her biggest issues currently are acute on chronic biventricular failure with an EF estimated to be less than 20%, although a TEE is planned for (has been cancelled x 1-2 times).  Also a major driving focus is bilateral multifocal pneumonia essentially unresponsive, not following commands but does move extremities spontaneously, sat edge of bed and took a few bites of food today. Deemed overall poor prognosis.  PEG placed yesterday.  Per family, frustrated and feeling "rushed" that they have to be out by Saturday. Discussed that a safe d/c plan is needed for d/c. Trying to arrange DME delivery. Needing education on home caregiving (here and continued by [facility])  [place] full code Continue full scope of treatment Work with social work to arrange adequate home support Continue family support PMT will continue to follow  Code Status: Full code  Prognosis: Unable to determine  Discharge Planning:  Anticipate d/c to home  Discussed with: Medical team, nursing team, patient family  Thank you for allowing [place] to participate in the care of Kelcy Laible PMT will continue to support holistically.  Time Total: 35 min  Visit consisted of counseling and education dealing with the complex and emotionally intense issues of symptom management and palliative care in the setting of serious and potentially life-threatening illness. Greater than 50%  of this time was spent counseling and coordinating care related to the above assessment and plan.  Walden Field, NP Palliative Medicine Team  Team Phone # [phone] (Nights/Weekends)  04/16/2021, 8:17 AM

## 2021-08-08 NOTE — Progress Notes (Signed)
Speech Language Pathology Treatment: Dysphagia;Cognitive-Linquistic  Patient Details Name: Dorothy Alvarez MRN: [id number] DOB: January 19, 1966 Today's Date: 08/08/2021 Time: 7169-6789 SLP Time Calculation (min) (ACUTE ONLY): 14 min  Assessment / Plan / Recommendation Clinical Impression  PEG placed yesterday.  Pt's mother at bedside.  Ms. Cubillos readily accepted POs offered.  She was repositioned to upright, accepted ice water and consumed six ounces with consistent effort and no s/sx of aspiration.  She did not follow commands for oral/motor movements or use of UE, but accepted solids with thorough mastication, no impression of delayed swallow, and consumed mixed solid/liquid consistencies with great functionality.  Pt was a total assist for feeding.  She produced "uh huh" on multiple occasions but no other phonation or functional communication could be elicited.  Eyes were open during session.  Resume dysphagia 2 diet, thin liquids with assistance for feeding. SLP will continue to follow for cognitive-communication and swallowing. Pt's mother agrees with plan.   HPI HPI: Pt presented to the ED at [facility] on 12/13 with progressive altered mental status and lethargy.  MRI was performed reveling and acute left PCA territory infarct, possibly embolic, including punctate areas of restricted diffusion in bilateral cerebral and right cerebellar hemisphere as well as medial right thalamus, encephalomalacia in medial right occipital lobe, right posterior hippocampus and left frontoparietal region.  She was then transferred here for further evaluation.  EEG revealed no seizure activity.  PEG 12/21.      SLP Plan  Continue with current plan of care      Recommendations for follow up therapy are one component of a multi-disciplinary discharge planning process, led by the attending physician.  Recommendations may be updated based on patient status, additional functional criteria and insurance  authorization.    Recommendations  Diet recommendations: Dysphagia 2 (fine chop);Thin liquid Liquids provided via: Cup;Teaspoon;Straw Medication Administration: Crushed with puree Supervision: Staff to assist with self feeding;Full supervision/cueing for compensatory strategies Compensations: Slow rate;Small sips/bites Postural Changes and/or Swallow Maneuvers: Seated upright 90 degrees                Oral Care Recommendations: Oral care BID Follow Up Recommendations: Skilled nursing-short term rehab (<3 hours/day) Assistance recommended at discharge: Frequent or constant Supervision/Assistance SLP Visit Diagnosis: Cognitive communication deficit (R41.841);Dysphagia, unspecified (R13.10) Plan: Continue with current plan of care         Zani Kyllonen L. Tivis Ringer, [place] Office number [phone] Pager [phone]   Juan Quam Laurice  08/08/2021, 1:23 PM

## 2021-08-08 NOTE — Progress Notes (Signed)
PROGRESS NOTE  [name]  DOB: 25-Apr-1966  PCP: Nolene Ebbs, MD UVO:[id number]  DOA: 07/30/2021  LOS: 9 days  Hospital Day: 10  Chief Complaint  Patient presents with   lethargic   Emesis   Hypertension          Brief narrative: Dorothy Alvarez is a 55 y.o. female with PMH significant for HTN, chronic pain syndrome, bipolar disorder/depression  Patient was brought to the ED at [facility] on 12/13 with complaint of increasing somnolence, lethargy for last several days.  In the ER, she was noted to be febrile.   Labs showed elevated WBC count, AKI, rhabdomyolysis, lactic acidosis. CT renal study showed emphysematous cystitis and a 4.7 cm rounded soft tissue structure between the gastric fundus and left upper pole of kidney. 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   MRI of the brain showed a diffusion defect concerning for acute left PCA territory infarct.   Patient was started on IV fluid, IV antibiotics. Patient was admitted to hospitalist service for sepsis, stroke. She was transferred to [facility] for neurology consult.  On arrival to [facility], patient was noted to be critical and transferred to ICU service. Further work-up showed echo with a low LVEF of ~ 20%, LV with global hypokinesis, RV systolic function moderately reduced with RVSP ~65, LA mildly dilated, moderate to severe TVR.  With clinical improvement, patient was transferred out of ICU service to [facility] on 12/17. Patient's mental status has not improved for the last several days and remains comatose.  Subjective: Patient was seen and examined this morning.   She seems a little more responsive today.  Try to open eyes on verbal command but he still continues to resist when I try to open her eyes.  Response to noxious stimulus.  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 -Urine culture sent on admission grew only 10,000 CFU per mL of Enterococcus faecalis. -To complete 7-day course of ampicillin IV today on 12/22. -Currently has Foley catheter.  Unable to remove Foley catheter as her mental status remains very poor.   Bilateral multifocal stroke -Per neurology note, patient had multifocal infarcts including left PCA, right cerebellum, bilateral frontal and right thalamus.   -MRI did not show large vessel occlusion but 50% stenosis of left PCA -Carotid duplex with nonsignificant stenosis of bilateral carotids -Echo with EF 20% and no LV thrombus -A1c 6.4, LDL 141 -Currently patient is unable to follow any commands, has NG tube for feeding. -Prior to admission, patient was not on any antiplatelet/anticoagulant.  Per neurology recommendation, patient was started on aspirin 325 mg.  After PEG tube was placed on 12/21, patient has been now switched to Eliquis as per neurology recommendation. -Continue Lipitor 80 mg daily.  Dysphagia -Per speech therapy note from 12/16, patient opened eyes and passed speech therapy evaluation.  Regular thin liquid diet was recommended.   -PEG tube placed yesterday.  Feeding resumed today.  Acute metabolic encephalopathy  -multifactorial -bilateral multifocal stroke, polypharmacy at home, AKI with rhabdo, emphysematous cystitis, possible low flow state.  -Her mental status however remains altered.  She seems more responsive today.  Try to open eyes on verbal command.  Response to noxious stimulus.  But resists when trying to open eyes  -Ammonia WNL, UDS neg, not hypercapnic on ABG, no evidence of seizure on EEG  -  lumbar puncture on 12/15 and CSF was sent for culture.  It showed predominantly mononuclear WBC but no organisms-not convincing for bacterial meningitis -Continue delirium precautions -Sedatives is on hold.   Acute on chronic biventricular heart failure  Essential hypertension -Echo with a EF  less than 20%, global hypokinesis, no LV thrombus, elevated RV systolic pressure to 66. -Was on Cleviprex in ICU.  Weaned down. -Currently on Coreg, amlodipine, clonidine, Imdur.  I will hold amlodipine and plan to resume Entresto if blood pressure is stable tomorrow.  AKI -Creatinine was up at 1.73 on admission.  Improved with hydration.  Rhabdomyolysis -On admission, CK level was elevated over 8000, probably related to prolonged immobility, AKI. -Improved with hydration.  Continue tube feeding and hydration  Lactic acidosis -Lactic acid level trend as below, peaked at 5.3.  Gradually trended down and normalized.  Mild transaminitis -Unclear cause, may be related to right heart failure itself.  Improving. Recent Labs  Lab 08/02/21 0432 08/04/21 0640  AST 125* 65*  ALT 127* 95*  ALKPHOS 109 96  BILITOT 0.8 0.8  PROT 7.4 6.2*  ALBUMIN 3.9 3.2*    Hypokalemia/hypophosphatemia -Potassium level remains low.  Daily replacement to continue. Recent Labs  Lab 08/02/21 1431 08/02/21 1749 08/03/21 0501 08/03/21 1701 08/04/21 0640 08/05/21 0322 08/06/21 0339 08/07/21 0254 08/08/21 1200  K  --   --  3.2*  --  3.1* 3.0* 3.4* 3.4* 3.6  MG 2.5* 2.4 2.3 2.2  --   --   --   --  2.3  PHOS 2.7 2.7 2.1* 3.1  --   --   --   --  2.8    Bipolar depression Chronic Pain  -No meds on hold   Abdominal Mass  -Incidental finding of 4.7 cm soft tissue mass on CT renal study done on admission. -Once her mental status and overall prognosis improves, we can consider obtaining CT abdomen pelvis with IV and oral contrast.  At this time, it would not change the course of her illness.  Goals of care -Patient is not showing good neurological recovery. Discussed with family.  Palliative care consulted on 12/18.  Remains full code at this time.  Mobility: PT eval Living condition: Was living at home with husband Nutritional status: Body mass index is 36.12 kg/m.  Nutrition Problem: Inadequate  oral intake Etiology: acute illness Signs/Symptoms: meal completion < 25% Diet:  Diet Order             DIET DYS 2 Room service appropriate? Yes; Fluid consistency: Thin  Diet effective now                  DVT prophylaxis:  Place and maintain sequential compression device Start: 08/01/21 1458 apixaban (ELIQUIS) tablet 5 mg   Antimicrobials: IV ampicillin till 12/24 Fluid: None Consultants: Neurology Family Communication: Called and updated patient's daughter and husband.  Status is: Inpatient  Continue in-hospital care because: PEG tube placed yesterday.  Feeding started today.  Medically getting ready to discharge Level of care: Progressive   Dispo: The patient is from: Home              Anticipated d/c is to: SNF recommended but family would like to take her home.  Plan for discharge tomorrow.              Patient currently is not medically stable to d/c.   Difficult to place patient No     Infusions:   sodium chloride 5 mL/hr  at 08/07/21 1244   feeding supplement (OSMOLITE 1.5 CAL) Stopped (08/06/21 2357)    Scheduled Meds:  apixaban  5 mg Oral BID   atorvastatin  80 mg Per Tube Daily   carvedilol  6.25 mg Per Tube BID WC   chlorhexidine  15 mL Mouth Rinse BID   Chlorhexidine Gluconate Cloth  6 each Topical Daily   cloNIDine  0.1 mg Oral BID   feeding supplement (PROSource TF)  90 mL Per Tube BID   isosorbide dinitrate  10 mg Per Tube TID   mouth rinse  15 mL Mouth Rinse q12n4p   methylphenidate  5 mg Oral BID WC   pantoprazole  40 mg Oral Daily   potassium chloride  20 mEq Per Tube BID    PRN meds: sodium chloride, acetaminophen **OR** acetaminophen (TYLENOL) oral liquid 160 mg/5 mL **OR** acetaminophen, hydrALAZINE, labetalol, ondansetron (ZOFRAN) IV   Antimicrobials: Anti-infectives (From admission, onward)    Start     Dose/Rate Route Frequency Ordered Stop   08/07/21 1628  ceFAZolin (ANCEF) IVPB 2g/100 mL premix        over 30 Minutes  Intravenous Continuous PRN 08/07/21 1634 08/07/21 1628   08/02/21 1000  vancomycin (VANCOREADY) IVPB 1250 mg/250 mL  Status:  Discontinued        1,250 mg 166.7 mL/hr over 90 Minutes Intravenous Every 24 hours 08/01/21 1008 08/02/21 1002   08/01/21 1115  ampicillin (OMNIPEN) 2 g in sodium chloride 0.9 % 100 mL IVPB        2 g 300 mL/hr over 20 Minutes Intravenous Every 6 hours 08/01/21 1023 08/08/21 1027   08/01/21 1100  cefTRIAXone (ROCEPHIN) 2 g in sodium chloride 0.9 % 100 mL IVPB  Status:  Discontinued        2 g 200 mL/hr over 30 Minutes Intravenous Every 12 hours 08/01/21 1000 08/02/21 1002   08/01/21 1100  vancomycin (VANCOREADY) IVPB 2000 mg/400 mL        2,000 mg 200 mL/hr over 120 Minutes Intravenous  Once 08/01/21 1007 08/01/21 1349   08/01/21 0800  cefTRIAXone (ROCEPHIN) 2 g in sodium chloride 0.9 % 100 mL IVPB  Status:  Discontinued        2 g 200 mL/hr over 30 Minutes Intravenous Every 24 hours 07/31/21 1354 08/01/21 1000   07/31/21 0800  cefTRIAXone (ROCEPHIN) 1 g in sodium chloride 0.9 % 100 mL IVPB  Status:  Discontinued        1 g 200 mL/hr over 30 Minutes Intravenous Every 24 hours 07/30/21 2349 07/31/21 1354   07/30/21 1915  ceFEPIme (MAXIPIME) 2 g in sodium chloride 0.9 % 100 mL IVPB        2 g 200 mL/hr over 30 Minutes Intravenous  Once 07/30/21 1907 07/30/21 2020   07/30/21 1900  vancomycin (VANCOCIN) IVPB 1000 mg/200 mL premix        1,000 mg 200 mL/hr over 60 Minutes Intravenous  Once 07/30/21 1847 07/30/21 2133       Objective: Vitals:   08/08/21 0824 08/08/21 1223  BP: (!) 189/109 135/87  Pulse: 88 83  Resp: 16 16  Temp: 98.9 F (37.2 C) (!) 97.5 F (36.4 C)  SpO2: 97% 96%    Intake/Output Summary (Last 24 hours) at 08/08/2021 1318 Last data filed at 08/08/2021 1144 Gross per 24 hour  Intake 820.85 ml  Output 2600 ml  Net -1779.15 ml    Filed Weights   08/03/21 0500 08/04/21 0500 08/06/21 0500  Weight:  100.5 kg 99.1 kg 101.5 kg   Weight  change:  Body mass index is 36.12 kg/m.   Physical Exam: General exam: Middle-aged African-American female. spontaneous movements seen but unable to follow commands.  Not in pain. Skin: No rashes, lesions or ulcers. HEENT: Atraumatic, normocephalic, no obvious bleeding.  Has NG tube in place Lungs: Diminished air entry in both bases CVS: Regular rate and rhythm, no murmur GI/Abd soft, nontender, nondistended, bowel sound present CNS: Gradually more responsive but is still actively resists when trying to open eyelids.  Spontaneous movement of extremities seen.  Grimaces to noxious stimuli Psychiatry: Sad affect Extremities: No pedal edema, no calf tenderness  Data Review: I have personally reviewed the laboratory data and studies available.  F/u labs ordered Unresulted Labs (From admission, onward)    None       Signed, Terrilee Croak, MD [facility] 08/08/2021

## 2021-08-08 NOTE — Progress Notes (Addendum)
Nutrition Follow-up  DOCUMENTATION CODES:   Obesity unspecified  INTERVENTION:  Transition to bolus TF regimen via G-tube: -Provide 337ml (1.5 cartons) Osmolite 1.5 QID, flush with 57ml free water before and after each bolus Provides 2130 kcals, 89 grams protein, 1073ml free water (1446ml total free water w/ flushes)  Encourage PO intake May need to provide additional free water flushes if pt not taking in enough PO to meet needs  Addendum 08/09/21 @ 1311 -- Received page from [employer] re home TF regimen. Currently out of Osmolite 1.5 cartons, so discussed substituting with Jevity 1.5. Orders as follows: Provide 320ml (1.5 cartons) Jevity 1.5 QID, flush with 85ml free water before and after each bolus (Provides 2130 kcals, 90 grams protein, 1074ml free water -- 1477ml with flushes)  NUTRITION DIAGNOSIS:   Inadequate oral intake related to acute illness as evidenced by meal completion < 25%.  ongoing  GOAL:   Patient will meet greater than or equal to 90% of their needs  Addressing with TF  MONITOR:   PO intake, TF tolerance  REASON FOR ASSESSMENT:   Consult Enteral/tube feeding initiation and management  ASSESSMENT:   Pt with PMH of HTN, CHF, bipolar disorder, anxiety, depression, IBS, learning disability admitted 12/13 for lethargy and somnolence, UTI, SIRS, new ischemic stroke, AKI, hypertensive.  12/15 - no sz on EEG, s/p LP 12/16 - Cortrak placed 12/17 -  tx to [facility] service 12/21 - s/p G-tube placement  Pt on dysphagia 2 diet but has continued to refuse POs. Pt had been receiving TF via Cortrak but underwent G-tube placement yesterday in IR and, per Radiology, tube is now ready for use. RN may resume TF via G-tube. Current orders: Osmolite 1.5 @ 20ml/hr with 61ml Prosource TF BID. Noted plans for pt to discharge tomorrow, so will transition pt to bolus feeding in anticipation of discharge and to ensure pt tolerates boluses.   UOP: 2630ml x24 hours I/O: +1527ml  since admit  Current weight: 101.5 kg Admit weight: 102 kg   Medications: Scheduled Meds:  apixaban  5 mg Oral BID   atorvastatin  80 mg Per Tube Daily   carvedilol  6.25 mg Per Tube BID WC   chlorhexidine  15 mL Mouth Rinse BID   Chlorhexidine Gluconate Cloth  6 each Topical Daily   cloNIDine  0.1 mg Oral BID   feeding supplement (PROSource TF)  90 mL Per Tube BID   isosorbide dinitrate  10 mg Per Tube TID   mouth rinse  15 mL Mouth Rinse q12n4p   methylphenidate  5 mg Oral BID WC   pantoprazole  40 mg Oral Daily   potassium chloride  20 mEq Per Tube BID  Continuous Infusions:  sodium chloride 5 mL/hr at 08/07/21 1244   feeding supplement (OSMOLITE 1.5 CAL) Stopped (08/06/21 2357)   Labs: Recent Labs  Lab 08/03/21 0501 08/03/21 1701 08/04/21 0640 08/06/21 0339 08/07/21 0254 08/08/21 1200  NA 139  --    < > 144 142 144  K 3.2*  --    < > 3.4* 3.4* 3.6  CL 101  --    < > 104 106 109  CO2 29  --    < > 31 28 26   BUN 19  --    < > 21* 30* 23*  CREATININE 0.89  --    < > 0.82 0.99 0.87  CALCIUM 8.2*  --    < > 9.1 8.8* 8.9  MG 2.3 2.2  --   --   --  2.3  PHOS 2.1* 3.1  --   --   --  2.8  GLUCOSE 173*  --    < > 124* 127* 160*   < > = values in this interval not displayed.  CBGs: 127-163 x24 hours  Diet Order:   Diet Order             DIET DYS 2 Room service appropriate? Yes; Fluid consistency: Thin  Diet effective now                   EDUCATION NEEDS:   Not appropriate for education at this time  Skin:  Skin Assessment: Reviewed RN Assessment  Last BM:  12/19  Height:   Ht Readings from Last 1 Encounters:  07/31/21 5\' 6"  (1.676 m)    Weight:   Wt Readings from Last 1 Encounters:  08/06/21 101.5 kg    BMI:  Body mass index is 36.12 kg/m.  Estimated Nutritional Needs:   Kcal:  1900-2100  Protein:  100-115 grams  Fluid:  >2 L/day     Theone Stanley., MS, RD, LDN (she/her/hers) RD pager number and weekend/on-call pager number located in  [place].

## 2021-08-08 NOTE — TOC Progression Note (Signed)
Transition of Care [facility]) - Progression Note    Patient Details  Name: Delilah Mulgrew MRN: [id number] Date of Birth: [birth year]  Transition of Care [facility]) CM/SW [place], [place] Phone Number: 08/08/2021, 4:32 PM  Clinical Narrative:   CSW provided referral to [employer] for home tube feeding, and sent DME orders to [employer] for equipment delivery. CSW coordinated with [employer] throughout the day as the tube feeding wasn't in stock. [employer] to reach out to family this evening to schedule education for tomorrow. CSW met with patient's spouse and daughter at bedside to discuss updates. Spouse and daughter still have to clear out space for hospital bed, spouse said he's unsure if he'll get that done before the holiday to get the patient home. CSW answered other questions from spouse and daughter, and will continue to follow.    Expected Discharge Plan: [place] Barriers to Discharge: Continued Medical Work up  Expected Discharge Plan and Services Expected Discharge Plan: [place] Choice: Home Health, Durable Medical Equipment Living arrangements for the past 2 months: Single Family Home                                       Social Determinants of Health (SDOH) Interventions    Readmission Risk Interventions No flowsheet data found.

## 2021-08-08 NOTE — Plan of Care (Signed)
°  Problem: Clinical Measurements: Goal: Ability to maintain clinical measurements within normal limits will improve Outcome: Progressing Goal: Will remain free from infection Outcome: Progressing Goal: Diagnostic test results will improve Outcome: Progressing Goal: Respiratory complications will improve Outcome: Progressing Goal: Cardiovascular complication will be avoided Outcome: Progressing   Problem: Nutrition: Goal: Adequate nutrition will be maintained Outcome: Progressing   Problem: Safety: Goal: Ability to remain free from injury will improve Outcome: Progressing   Problem: Education: Goal: Knowledge of General Education information will improve Description: Including pain rating scale, medication(s)/side effects and non-pharmacologic comfort measures Outcome: Progressing

## 2021-08-09 LAB — GLUCOSE, CAPILLARY
Glucose-Capillary: 111 mg/dL — ABNORMAL HIGH (ref 70–99)
Glucose-Capillary: 143 mg/dL — ABNORMAL HIGH (ref 70–99)
Glucose-Capillary: 174 mg/dL — ABNORMAL HIGH (ref 70–99)
Glucose-Capillary: 207 mg/dL — ABNORMAL HIGH (ref 70–99)
Glucose-Capillary: 186 mg/dL — ABNORMAL HIGH (ref 70–99)

## 2021-08-09 MED ORDER — INSULIN ASPART 100 UNIT/ML IJ SOLN
0.0000 [IU] | Freq: Every day | INTRAMUSCULAR | Status: DC
  Administered 2021-08-10 – 2021-08-12 (×3): 2 [IU] via SUBCUTANEOUS

## 2021-08-09 MED ORDER — INSULIN ASPART 100 UNIT/ML IJ SOLN
0.0000 [IU] | Freq: Three times a day (TID) | INTRAMUSCULAR | Status: DC
  Administered 2021-08-10: 07:00:00 2 [IU] via SUBCUTANEOUS
  Administered 2021-08-11: 10:00:00 1 [IU] via SUBCUTANEOUS
  Administered 2021-08-11: 17:00:00 5 [IU] via SUBCUTANEOUS
  Administered 2021-08-12: 09:00:00 2 [IU] via SUBCUTANEOUS
  Administered 2021-08-12: 12:00:00 3 [IU] via SUBCUTANEOUS
  Administered 2021-08-12: 19:00:00 1 [IU] via SUBCUTANEOUS
  Administered 2021-08-13: 12:00:00 3 [IU] via SUBCUTANEOUS
  Administered 2021-08-13: 07:00:00 1 [IU] via SUBCUTANEOUS

## 2021-08-09 MED ORDER — SACUBITRIL-VALSARTAN 24-26 MG PO TABS
1.0000 | ORAL_TABLET | Freq: Two times a day (BID) | ORAL | Status: DC
  Administered 2021-08-09 – 2021-08-11 (×5): 1 via ORAL
  Filled 2021-08-09 (×8): qty 1

## 2021-08-09 NOTE — Discharge Instructions (Addendum)
Feeding tube Care: Flush tube with 32ml of sterile water every 4 hours, before and after medication administration.    Information on my medicine - ELIQUIS (apixaban)  Why was Eliquis prescribed for you? Eliquis was prescribed for you to reduce the risk of a blood clot forming that can cause a stroke if you have a medical condition called atrial fibrillation (a type of irregular heartbeat).  What do You need to know about Eliquis ? Take your Eliquis TWICE DAILY - one tablet in the morning and one tablet in the evening with or without food. If you have difficulty swallowing the tablet whole please discuss with your pharmacist how to take the medication safely.  Take Eliquis exactly as prescribed by your doctor and DO NOT stop taking Eliquis without talking to the doctor who prescribed the medication.  Stopping may increase your risk of developing a stroke.  Refill your prescription before you run out.  After discharge, you should have regular check-up appointments with your healthcare provider that is prescribing your Eliquis.  In the future your dose may need to be changed if your kidney function or weight changes by a significant amount or as you get older.  What do you do if you miss a dose? If you miss a dose, take it as soon as you remember on the same day and resume taking twice daily.  Do not take more than one dose of ELIQUIS at the same time to make up a missed dose.  Important Safety Information A possible side effect of Eliquis is bleeding. You should call your healthcare provider right away if you experience any of the following: Bleeding from an injury or your nose that does not stop. Unusual colored urine (red or dark brown) or unusual colored stools (red or black). Unusual bruising for unknown reasons. A serious fall or if you hit your head (even if there is no bleeding).  Some medicines may interact with Eliquis and might increase your risk of bleeding or clotting  while on Eliquis. To help avoid this, consult your healthcare provider or pharmacist prior to using any new prescription or non-prescription medications, including herbals, vitamins, non-steroidal anti-inflammatory drugs (NSAIDs) and supplements.  This website has more information on Eliquis (apixaban): http://www.eliquis.com/eliquis/home

## 2021-08-09 NOTE — Plan of Care (Signed)
°  Problem: Education: Goal: Knowledge of General Education information will improve Description: Including pain rating scale, medication(s)/side effects and non-pharmacologic comfort measures Outcome: Progressing   Problem: Spontaneous Subarachnoid Hemorrhage Tissue Perfusion: Goal: Complications of Spontaneous Subarachnoid Hemorrhage will be minimized Outcome: Progressing

## 2021-08-09 NOTE — TOC Progression Note (Signed)
Transition of Care [facility]) - Progression Note    Patient Details  Name: Dorothy Alvarez MRN: [id number] Date of Birth: March 27, 1966  Transition of Care [facility]) CM/SW [place], [place] Phone Number: 08/09/2021, 2:32 PM  Clinical Narrative:   CSW alerted by [employer] that tube feeding education has been completed, and tube feeding supplies will be delivered tonight for the patient when she goes home. CSW met with daughter at bedside to discuss, she will be doing the next bolus feed herself at 4 to ensure she knows what she's doing. Still awaiting DME delivery, family to clean out space this weekend for delivery. CSW asked if family had decided on home health agency so that could be scheduled with the holiday, and family to let CSW know by end of day today. CSW to follow.    Expected Discharge Plan: [place] Barriers to Discharge: Continued Medical Work up  Expected Discharge Plan and Services Expected Discharge Plan: [place] Choice: Home Health, Durable Medical Equipment Living arrangements for the past 2 months: Single Family Home                                       Social Determinants of Health (SDOH) Interventions    Readmission Risk Interventions No flowsheet data found.

## 2021-08-09 NOTE — Progress Notes (Signed)
Physical Therapy Treatment Patient Details Name: Dorothy Alvarez MRN: [id number] DOB: 12-17-1965 Today's Date: 08/09/2021   History of Present Illness Pt is a 55 y/o female presented to [facility] ED on 12/13 for lethargy and confusion x 2 weeks. Found to have AKI, rhabdo, and emphysematous cystitis. MRI showed acute L PCA infarct, additional punctate areas of restricted diffusion in bilateral cerebral, R cerebellar, and medial R thalamus, and encephalomalacia in medial R occipital lobe, R posterior hippocampus, and L frontoparietal region. PMH: HTN, chronic pain syndrome, bipolar disorder, anxiety    PT Comments    Pt making some good progress with overall mobility and participation today. She continues to require heavy two person physical assistance for all mobility. She was able to stand with max A x2 and use of the STEDY. The family continues to want pt to d/c home with their support/assistance. Family will need a hospital bed and a mechanical lift. Pt would continue to benefit from skilled physical therapy services at this time while admitted and after d/c to address the below listed limitations in order to improve overall safety and independence with functional mobility.    Recommendations for follow up therapy are one component of a multi-disciplinary discharge planning process, led by the attending physician.  Recommendations may be updated based on patient status, additional functional criteria and insurance authorization.  Follow Up Recommendations  Skilled nursing-short term rehab (<3 hours/day)     Assistance Recommended at Discharge Frequent or [facility] Hospital bed;Other (comment) (MECHANICAL LIFT)    Recommendations for Other Services       Precautions / Restrictions Precautions Precautions: Fall Precaution Comments: g-tube Restrictions Weight Bearing Restrictions: No     Mobility  Bed Mobility Overal bed mobility: Needs  Assistance Bed Mobility: Supine to Sit;Sit to Supine     Supine to sit: Max assist;+2 for physical assistance Sit to supine: Max assist;+2 for physical assistance   General bed mobility comments: required assistance for trunk and LEs.  Partial participation with patient with moving legs towards EOB    Transfers Overall transfer level: Needs assistance Equipment used:  (STEDY) Transfers: Sit to/from Stand Sit to Stand: Max assist;+2 physical assistance           General transfer comment: pt standing from EOB x1 with mod A x2 and use of STEDY, she was able to utilize her bilateral UEs on the bar to assist with pulling herself into standing. Pt standing from pads of STEDY x1 with max A x2 required.    Ambulation/Gait               General Gait Details: unable   Stairs             Wheelchair Mobility    Modified Rankin (Stroke Patients Only) Modified Rankin (Stroke Patients Only) Pre-Morbid Rankin Score: No symptoms Modified Rankin: Severe disability     Balance Overall balance assessment: Needs assistance Sitting-balance support: Single extremity supported;Bilateral upper extremity supported;Feet supported Sitting balance-Leahy Scale: Poor Sitting balance - Comments: patient would attempt to lean forward and required assistance for balance to prevent fall.  Able to maintain sitting balance intermittently   Standing balance support: Bilateral upper extremity supported;Reliant on assistive device for balance Standing balance-Leahy Scale: Poor Standing balance comment: stood in [place] with mod assist +2 for balance for 2 stands  Cognition Arousal/Alertness: Lethargic Behavior During Therapy: Flat affect Overall Cognitive Status: Difficult to assess                                 General Comments: less lethargic on this session with patient responding to questions occassionally.  Responds better with  tactile cues        Exercises      General Comments        Pertinent Vitals/Pain Pain Assessment: Faces Faces Pain Scale: No hurt    Home Living                          Prior Function            PT Goals (current goals can now be found in the care plan section) Acute Rehab PT Goals PT Goal Formulation: With family Time For Goal Achievement: 08/16/21 Potential to Achieve Goals: Fair Progress towards PT goals: Progressing toward goals    Frequency    Min 3X/week      PT Plan Current plan remains appropriate    Co-evaluation PT/OT/SLP Co-Evaluation/Treatment: Yes Reason for Co-Treatment: Complexity of the patient's impairments (multi-system involvement);For patient/therapist safety;To address functional/ADL transfers PT goals addressed during session: Mobility/safety with mobility;Balance;Proper use of DME;Strengthening/ROM OT goals addressed during session: ADL's and self-care      AM-PAC PT "6 Clicks" Mobility   Outcome Measure  Help needed turning from your back to your side while in a flat bed without using bedrails?: Total Help needed moving from lying on your back to sitting on the side of a flat bed without using bedrails?: Total Help needed moving to and from a bed to a chair (including a wheelchair)?: Total Help needed standing up from a chair using your arms (e.g., wheelchair or bedside chair)?: Total Help needed to walk in hospital room?: Total Help needed climbing 3-5 steps with a railing? : Total 6 Click Score: 6    End of Session   Activity Tolerance: Patient limited by lethargy Patient left: in bed;with call bell/phone within reach;with family/visitor present;Other (comment) (with OT in room to work on self-care tasks) Nurse Communication: Mobility status PT Visit Diagnosis: Muscle weakness (generalized) (M62.81);Unsteadiness on feet (R26.81);Other abnormalities of gait and mobility (R26.89);Other symptoms and signs involving the  nervous system (R29.898)     Time: 0865-7846 PT Time Calculation (min) (ACUTE ONLY): 19 min  Charges:  $Therapeutic Activity: 8-22 mins                     Anastasio Champion, DPT  Acute Rehabilitation Services Office [place] 08/09/2021, 12:02 PM

## 2021-08-09 NOTE — Progress Notes (Signed)
Occupational Therapy Treatment Patient Details Name: Roger Fasnacht MRN: [id number] DOB: [birth year] Today's Date: 08/09/2021   History of present illness 55 y/o female presented to [facility] ED on 12/13 for lethargy and confusion x 2 weeks. Found to have AKI, rhabdo, and emphysematous cystitis. MRI showed acute L PCA infarct, additional punctate areas of restricted diffusion in bilateral cerebral, R cerebellar, and medial R thalamus, and encephalomalacia in medial R occipital lobe, R posterior hippocampus, and L frontoparietal region. PMH: HTN, chronic pain syndrome, bipolar disorder, anxiety   OT comments  Patient seen as co-treat for bed mobility and attempting to stand for patient and therapist safety. Patient had eyes open this session and was more verbal when asked questions. Patient participated in getting legs towards EOB and was max assist +2 to complete. Patient stood in [place] from EOB with max assist +2 and mod assist +2 for balance before resting on lift pads.  On second attempt patient was more difficult to stand and was returned to EOB and then supine following stand.  While at bed level grooming attempted with patient unable to follow verbal command but was able to wash face with hand over hand to initiate. Patient asked to raise are and grip hands with good success with tactile cue but unable to perform with verbal only.  Acute OT to continue to follow.    Recommendations for follow up therapy are one component of a multi-disciplinary discharge planning process, led by the attending physician.  Recommendations may be updated based on patient status, additional functional criteria and insurance authorization.    Follow Up Recommendations  Skilled nursing-short term rehab (<3 hours/day)    Assistance Recommended at Discharge Frequent or constant Supervision/Assistance  Equipment Recommendations  BSC/3in1;Wheelchair (measurements OT);Wheelchair cushion (measurements OT);Hospital bed     Recommendations for Other Services      Precautions / Restrictions Precautions Precautions: Fall Precaution Comments: cortrak, foley Restrictions Weight Bearing Restrictions: No       Mobility Bed Mobility Overal bed mobility: Needs Assistance Bed Mobility: Supine to Sit;Sit to Supine     Supine to sit: Max assist;+2 for physical assistance Sit to supine: Max assist;+2 for physical assistance   General bed mobility comments: required assistance for trunk and LEs.  Partial participation with patient with moving legs towards EOB    Transfers Overall transfer level: Needs assistance Equipment used: Ambulation equipment used Transfers: Sit to/from Stand Sit to Stand: Max assist;+2 physical assistance           General transfer comment: Stood in [place] with patient requiring mod assist of 2 while standing and sat back on Stedy pads.  More difficult to stand on second attempt and returned to EOB following     Balance Overall balance assessment: Needs assistance Sitting-balance support: Single extremity supported;Bilateral upper extremity supported;Feet supported Sitting balance-Leahy Scale: Fair Sitting balance - Comments: patient would attempt to lean forward and required assistance for balance to prevent fall.  Able to maintain sitting balance intermittently   Standing balance support: Bilateral upper extremity supported;Reliant on assistive device for balance Standing balance-Leahy Scale: Poor Standing balance comment: stood in [place] with mod assist +2 for balance for 2 stands                           ADL either performed or assessed with clinical judgement   ADL Overall ADL's : Needs assistance/impaired     Grooming: Wash/dry hands;Bed level;Moderate assistance Grooming Details (indicate cue type  and reason): hand over hand to initiate.                               General ADL Comments: required hand over hand to initiate face washing.  does not respond well to verbal cues but will with tactile    Extremity/Trunk Assessment Upper Extremity Assessment Upper Extremity Assessment: Difficult to assess due to impaired cognition            Vision       Perception     Praxis      Cognition Arousal/Alertness: Lethargic Behavior During Therapy: Flat affect Overall Cognitive Status: Difficult to assess                                 General Comments: less lethargic on this session with patient responding to questions occassionally.  Responds better with tactile cues          Exercises     Shoulder Instructions       General Comments      Pertinent Vitals/ Pain       Pain Assessment: Faces Faces Pain Scale: No hurt  Home Living                                          Prior Functioning/Environment              Frequency  Min 2X/week        Progress Toward Goals  OT Goals(current goals can now be found in the care plan section)  Progress towards OT goals: Progressing toward goals  Acute Rehab OT Goals Patient Stated Goal: family would like to take patient home OT Goal Formulation: With family Time For Goal Achievement: 08/16/21 Potential to Achieve Goals: Fair ADL Goals Pt Will Perform Grooming: with max assist Additional ADL Goal #1: Pt will follow 50% of simple 1 step comands  Plan Discharge plan remains appropriate    Co-evaluation    PT/OT/SLP Co-Evaluation/Treatment: Yes Reason for Co-Treatment: Complexity of the patient's impairments (multi-system involvement);For patient/therapist safety;To address functional/ADL transfers   OT goals addressed during session: ADL's and self-care      AM-PAC OT "6 Clicks" Daily Activity     Outcome Measure   Help from another person eating meals?: Total Help from another person taking care of personal grooming?: A Lot Help from another person toileting, which includes using toliet, bedpan, or urinal?:  Total Help from another person bathing (including washing, rinsing, drying)?: Total Help from another person to put on and taking off regular upper body clothing?: Total Help from another person to put on and taking off regular lower body clothing?: Total 6 Click Score: 7    End of Session Equipment Utilized During Treatment: Other (comment) (stedy)  OT Visit Diagnosis: Other abnormalities of gait and mobility (R26.89);Muscle weakness (generalized) (M62.81);Low vision, both eyes (H54.2);Other symptoms and signs involving cognitive function;Other symptoms and signs involving the nervous system (R29.898)   Activity Tolerance Patient limited by lethargy   Patient Left in bed;with call bell/phone within reach;with bed alarm set;with family/visitor present   Nurse Communication Mobility status        Time: [id number] OT Time Calculation (min): 25 min  Charges: OT General Charges $OT Visit: 1 Visit OT Treatments $  Self Care/Home Management : 8-22 mins  Lodema Hong, OTA Acute Rehabilitation Services  Pager [phone] Office [place] 08/09/2021, 10:32 AM

## 2021-08-09 NOTE — Progress Notes (Signed)
PROGRESS NOTE  Dorothy Alvarez  DOB: February 01, 1966  PCP: Nolene Ebbs, MD ZDG:[id number]  DOA: 07/30/2021  LOS: 10 days  Hospital Day: 70  Chief Complaint  Patient presents with   lethargic   Emesis   Hypertension          Brief narrative: Dorothy Alvarez is a 55 y.o. female with PMH significant for HTN, chronic pain syndrome, bipolar disorder/depression  Patient was brought to the ED at [facility] on 12/13 with complaint of increasing somnolence, lethargy for last several days.  In the ER, she was noted to be febrile.   Labs showed elevated WBC count, AKI, rhabdomyolysis, lactic acidosis. CT renal study showed emphysematous cystitis and a 4.7 cm rounded soft tissue structure between the gastric fundus and left upper pole of kidney. 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   MRI of the brain showed a diffusion defect concerning for acute left PCA territory infarct.   Patient was started on IV fluid, IV antibiotics. Patient was admitted to hospitalist service for sepsis, stroke. She was transferred to [facility] for neurology consult.  On arrival to [facility], patient was noted to be critical and transferred to ICU service. Further work-up showed echo with a low LVEF of ~ 20%, LV with global hypokinesis, RV systolic function moderately reduced with RVSP ~65, LA mildly dilated, moderate to severe TVR.  With clinical improvement, patient was transferred out of ICU service to [facility] on 12/17. Patient's mental status has not improved for the last several days and remains comatose.  Subjective: Patient was seen and examined this morning.   She was being worked up by physical therapy. Inconsistently following commands.  Very minimal words.  Mental status gradually improving. Patient's mother at bedside.  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 -Urine culture sent on admission grew only 10,000 CFU per mL of Enterococcus faecalis. -Completed 7-day course of ampicillin IV today on 12/22. -Currently has Foley catheter.  Unable to remove Foley catheter while her mobility and mental status remains very poor.   Bilateral multifocal stroke -Per neurology note, patient had multifocal infarcts including left PCA, right cerebellum, bilateral frontal and right thalamus.   -MRI did not show large vessel occlusion but 50% stenosis of left PCA -Carotid duplex with nonsignificant stenosis of bilateral carotids -Echo with EF 20% and no LV thrombus -A1c 6.4, LDL 141 -Currently patient is unable to follow any commands, has NG tube for feeding. -Prior to admission, patient was not on any antiplatelet/anticoagulant.  Per neurology recommendation, patient was started on aspirin 325 mg.  After PEG tube was placed on 12/21, patient was switched to Eliquis as per neurology recommendation. -Continue Lipitor 80 mg daily.  Dysphagia -Per speech therapy note from 12/16, patient opened eyes and passed speech therapy evaluation.  Regular thin liquid diet was recommended.   -PEG tube placed on 12/21.  Continue tube feeding.  Acute metabolic encephalopathy  -multifactorial -bilateral multifocal stroke, polypharmacy at home, AKI with rhabdo, emphysematous cystitis, possible low flow state.  -For several days, patient's mental status was poor, she was not responding to any commands.  Seems to be gradually improving.  She was able to participate with therapy today.  She will intermittently open eyes and follow motor commands.   Acute on chronic biventricular heart failure  Essential hypertension -Echo with a EF less than 20%,  global hypokinesis, no LV thrombus, elevated RV systolic pressure to 66. -Was on Cleviprex in ICU.  Weaned down. -Currently on Coreg, clonidine, Imdur.  Resume Entresto today.  AKI -Creatinine was up at 1.73 on  admission.  Improved with hydration.  Rhabdomyolysis -On admission, CK level was elevated over 8000, probably related to prolonged immobility, AKI. -Improved with hydration.  Continue tube feeding and hydration  Lactic acidosis -Lactic acid level trend as below, peaked at 5.3.  Gradually trended down and normalized.  Mild transaminitis -Unclear cause, may be related to right heart failure itself.  Improving.  Repeat labs tomorrow Recent Labs  Lab 08/04/21 0640  AST 65*  ALT 95*  ALKPHOS 96  BILITOT 0.8  PROT 6.2*  ALBUMIN 3.2*   Hypokalemia/hypophosphatemia -Electrolytes level improved with replacement.  Bipolar depression Chronic Pain  -No meds on hold   Abdominal Mass  -Incidental finding of 4.7 cm soft tissue mass on CT renal study done on admission. -Once her mental status and overall prognosis improves, we can consider obtaining CT abdomen pelvis with IV and oral contrast.  At this time, it would not change the course of her illness.  Goals of care -Palliative care consulted on 12/18.  Remains full code at this time.  Mobility: PT eval Living condition: Was living at home with husband Nutritional status: Body mass index is 36.12 kg/m.  Nutrition Problem: Inadequate oral intake Etiology: acute illness Signs/Symptoms: meal completion < 25% Diet:  Diet Order             DIET DYS 2 Room service appropriate? Yes; Fluid consistency: Thin  Diet effective now                  DVT prophylaxis:  Place and maintain sequential compression device Start: 08/01/21 1458 apixaban (ELIQUIS) tablet 5 mg   Antimicrobials: Completed the course of antibiotics Fluid: None Consultants: Neurology Family Communication: Called and updated patient's daughter and husband.  Status is: Inpatient  Continue in-hospital care because: Weakness improving.  Family refuses SNF.  Tube feeding resumed.  Medications adjusted.  Monitor hemodynamics for next 24 hours. Level of care:  Progressive   Dispo: The patient is from: Home              Anticipated d/c is to: SNF recommended but family would like to take her home.  They are asking more time to complete arrangements at home.               Patient currently is medically stable to d/c.   Difficult to place patient No     Infusions:   sodium chloride 5 mL/hr at 08/07/21 1244    Scheduled Meds:  apixaban  5 mg Oral BID   atorvastatin  80 mg Per Tube Daily   carvedilol  6.25 mg Per Tube BID WC   chlorhexidine  15 mL Mouth Rinse BID   Chlorhexidine Gluconate Cloth  6 each Topical Daily   cloNIDine  0.1 mg Oral BID   feeding supplement (OSMOLITE 1.5 CAL)  355 mL Per Tube QID   isosorbide dinitrate  10 mg Per Tube TID   mouth rinse  15 mL Mouth Rinse q12n4p   methylphenidate  5 mg Oral BID WC   pantoprazole  40 mg Oral Daily   potassium chloride  20 mEq Per Tube BID   sacubitril-valsartan  1 tablet Oral BID    PRN meds: sodium chloride, acetaminophen **OR** acetaminophen (TYLENOL) oral liquid 160 mg/5 mL **OR** acetaminophen,  hydrALAZINE, labetalol, ondansetron (ZOFRAN) IV   Antimicrobials: Anti-infectives (From admission, onward)    Start     Dose/Rate Route Frequency Ordered Stop   08/07/21 1628  ceFAZolin (ANCEF) IVPB 2g/100 mL premix        over 30 Minutes Intravenous Continuous PRN 08/07/21 1634 08/07/21 1628   08/02/21 1000  vancomycin (VANCOREADY) IVPB 1250 mg/250 mL  Status:  Discontinued        1,250 mg 166.7 mL/hr over 90 Minutes Intravenous Every 24 hours 08/01/21 1008 08/02/21 1002   08/01/21 1115  ampicillin (OMNIPEN) 2 g in sodium chloride 0.9 % 100 mL IVPB        2 g 300 mL/hr over 20 Minutes Intravenous Every 6 hours 08/01/21 1023 08/08/21 1027   08/01/21 1100  cefTRIAXone (ROCEPHIN) 2 g in sodium chloride 0.9 % 100 mL IVPB  Status:  Discontinued        2 g 200 mL/hr over 30 Minutes Intravenous Every 12 hours 08/01/21 1000 08/02/21 1002   08/01/21 1100  vancomycin (VANCOREADY) IVPB  2000 mg/400 mL        2,000 mg 200 mL/hr over 120 Minutes Intravenous  Once 08/01/21 1007 08/01/21 1349   08/01/21 0800  cefTRIAXone (ROCEPHIN) 2 g in sodium chloride 0.9 % 100 mL IVPB  Status:  Discontinued        2 g 200 mL/hr over 30 Minutes Intravenous Every 24 hours 07/31/21 1354 08/01/21 1000   07/31/21 0800  cefTRIAXone (ROCEPHIN) 1 g in sodium chloride 0.9 % 100 mL IVPB  Status:  Discontinued        1 g 200 mL/hr over 30 Minutes Intravenous Every 24 hours 07/30/21 2349 07/31/21 1354   07/30/21 1915  ceFEPIme (MAXIPIME) 2 g in sodium chloride 0.9 % 100 mL IVPB        2 g 200 mL/hr over 30 Minutes Intravenous  Once 07/30/21 1907 07/30/21 2020   07/30/21 1900  vancomycin (VANCOCIN) IVPB 1000 mg/200 mL premix        1,000 mg 200 mL/hr over 60 Minutes Intravenous  Once 07/30/21 1847 07/30/21 2133       Objective: Vitals:   08/09/21 0747 08/09/21 1133  BP: (!) 154/133 (!) 166/96  Pulse: 83 86  Resp: 20 19  Temp: (!) 97.5 F (36.4 C) (!) 97.5 F (36.4 C)  SpO2: 100% 100%    Intake/Output Summary (Last 24 hours) at 08/09/2021 1431 Last data filed at 08/09/2021 1243 Gross per 24 hour  Intake --  Output 1300 ml  Net -1300 ml   Filed Weights   08/03/21 0500 08/04/21 0500 08/06/21 0500  Weight: 100.5 kg 99.1 kg 101.5 kg   Weight change:  Body mass index is 36.12 kg/m.   Physical Exam: General exam: Middle-aged African-American female.  Not in physical distress commands.  Not in pain. Skin: No rashes, lesions or ulcers. HEENT: Atraumatic, normocephalic, no obvious bleeding.  Has NG tube in place Lungs: Diminished air entry in both bases CVS: Regular rate and rhythm, no murmur GI/Abd soft, nontender, nondistended, bowel sound present CNS: Gradually more responsive, able to participate with physical therapy today.  Intermittently follows command.   Psychiatry: Sad affect Extremities: No pedal edema, no calf tenderness  Data Review: I have personally reviewed the  laboratory data and studies available.  F/u labs ordered Unresulted Labs (From admission, onward)     Start     Ordered   08/10/21 0500  CBC with Differential/Platelet  Tomorrow morning,  R       Question:  Specimen collection method  Answer:  Lab=Lab collect   08/09/21 1428   08/10/21 0500  Comprehensive metabolic panel  Tomorrow morning,   R       Question:  Specimen collection method  Answer:  Lab=Lab collect   08/09/21 1428   08/10/21 0500  Protime-INR  Tomorrow morning,   R       Question:  Specimen collection method  Answer:  Lab=Lab collect   08/09/21 1429            Signed, Terrilee Croak, MD [facility] 08/09/2021

## 2021-08-10 DIAGNOSIS — R6521 Severe sepsis with septic shock: Secondary | ICD-10-CM

## 2021-08-10 DIAGNOSIS — Z4659 Encounter for fitting and adjustment of other gastrointestinal appliance and device: Secondary | ICD-10-CM

## 2021-08-10 DIAGNOSIS — I69391 Dysphagia following cerebral infarction: Secondary | ICD-10-CM

## 2021-08-10 DIAGNOSIS — R739 Hyperglycemia, unspecified: Secondary | ICD-10-CM | POA: Diagnosis present

## 2021-08-10 DIAGNOSIS — I5043 Acute on chronic combined systolic (congestive) and diastolic (congestive) heart failure: Secondary | ICD-10-CM

## 2021-08-10 DIAGNOSIS — A4181 Sepsis due to Enterococcus: Secondary | ICD-10-CM

## 2021-08-10 LAB — GLUCOSE, CAPILLARY
Glucose-Capillary: 156 mg/dL — ABNORMAL HIGH (ref 70–99)
Glucose-Capillary: 211 mg/dL — ABNORMAL HIGH (ref 70–99)
Glucose-Capillary: 235 mg/dL — ABNORMAL HIGH (ref 70–99)
Glucose-Capillary: 242 mg/dL — ABNORMAL HIGH (ref 70–99)

## 2021-08-10 LAB — CBC WITH DIFFERENTIAL/PLATELET
Abs Immature Granulocytes: 0.07 10*3/uL (ref 0.00–0.07)
Basophils Absolute: 0.1 10*3/uL (ref 0.0–0.1)
Basophils Relative: 0 %
Eosinophils Absolute: 0.4 10*3/uL (ref 0.0–0.5)
Eosinophils Relative: 3 %
HCT: 47.1 % — ABNORMAL HIGH (ref 36.0–46.0)
Hemoglobin: 16.7 g/dL — ABNORMAL HIGH (ref 12.0–15.0)
Immature Granulocytes: 1 %
Lymphocytes Relative: 13 %
Lymphs Abs: 1.9 10*3/uL (ref 0.7–4.0)
MCH: 31.9 pg (ref 26.0–34.0)
MCHC: 35.5 g/dL (ref 30.0–36.0)
MCV: 90.1 fL (ref 80.0–100.0)
Monocytes Absolute: 1 10*3/uL (ref 0.1–1.0)
Monocytes Relative: 7 %
Neutro Abs: 10.8 10*3/uL — ABNORMAL HIGH (ref 1.7–7.7)
Neutrophils Relative %: 76 %
Platelets: 407 10*3/uL — ABNORMAL HIGH (ref 150–400)
RBC: 5.23 MIL/uL — ABNORMAL HIGH (ref 3.87–5.11)
RDW: 12.5 % (ref 11.5–15.5)
WBC: 14.1 10*3/uL — ABNORMAL HIGH (ref 4.0–10.5)
nRBC: 0 % (ref 0.0–0.2)

## 2021-08-10 LAB — COMPREHENSIVE METABOLIC PANEL
ALT: 48 U/L — ABNORMAL HIGH (ref 0–44)
AST: 39 U/L (ref 15–41)
Albumin: 3.4 g/dL — ABNORMAL LOW (ref 3.5–5.0)
Alkaline Phosphatase: 89 U/L (ref 38–126)
Anion gap: 9 (ref 5–15)
BUN: 17 mg/dL (ref 6–20)
CO2: 21 mmol/L — ABNORMAL LOW (ref 22–32)
Calcium: 8.7 mg/dL — ABNORMAL LOW (ref 8.9–10.3)
Chloride: 108 mmol/L (ref 98–111)
Creatinine, Ser: 0.77 mg/dL (ref 0.44–1.00)
GFR, Estimated: >60 mL/min (ref >60)
Glucose, Bld: 207 mg/dL — ABNORMAL HIGH (ref 70–99)
Potassium: 3.7 mmol/L (ref 3.5–5.1)
Sodium: 138 mmol/L (ref 135–145)
Total Bilirubin: 0.9 mg/dL (ref 0.3–1.2)
Total Protein: 6.3 g/dL — ABNORMAL LOW (ref 6.5–8.1)

## 2021-08-10 LAB — PROTIME-INR
INR: 1.2 (ref 0.8–1.2)
Prothrombin Time: 15.4 seconds — ABNORMAL HIGH (ref 11.4–15.2)

## 2021-08-10 NOTE — Assessment & Plan Note (Addendum)
Multifactorial from CVA plus polypharmacy at home plus AKI plus sepsis.  Patient continues to show improvements, starting to follow some commands and participate with therapy.  Starting to show signs of increasing alertness.

## 2021-08-10 NOTE — TOC Progression Note (Addendum)
Transition of Care [facility]) - Progression Note    Patient Details  Name: Dorothy Alvarez MRN: [id number] Date of Birth: 07/20/1966  Transition of Care [facility]) CM/SW Contact  Carles Collet, RN Phone Number: 08/10/2021, 1:15 PM  Clinical Narrative:    Requested by MD to follow up on DME and [facility] choices. Called daughter with whom CSW had been setting up services. She stated something, unsure what but likely feeds, would be delivered at 2pm. She requested to add her father to the call. He was upset with her that she had not told him about delivery.  He became belligerent, yelling at me when asked about DME delivery and timeframe of set up for hospital bed. He states it will be Tuesday or Wednesday before he has the time to make room for bed. CM discussed with spouse and daughter that there is a risk of insurance not paying for a prolonged stay for this reason without medical necessity.  Husband got off call.  Reviewed with daughter that [facility] agency still needs picked, and we discussed agencies that accept [employer]. She agreed allow me to check availability with [facility] for now, and will follow up with me on final determiation for [facility] agency.   [facility] would be able to accept, will notify them of formal referral once decision made   16:18 Spoke w patient's daughter who states that they would like to use [place], [place] notified. She was speaking to her dad in the background and he states that he will have room cleared out by Monday. Please follow up Monday about delivery of bed.      Expected Discharge Plan: [place] Barriers to Discharge: Continued Medical Work up  Expected Discharge Plan and Services Expected Discharge Plan: [place] Choice: Home Health, Durable Medical Equipment Living arrangements for the past 2 months: Single Family Home                                       Social Determinants of Health (SDOH)  Interventions    Readmission Risk Interventions No flowsheet data found.

## 2021-08-10 NOTE — Assessment & Plan Note (Signed)
Normally on Seroquel.  Held currently due to alertness status.  May need to restart later on.

## 2021-08-10 NOTE — Assessment & Plan Note (Addendum)
A1c of 6.4.  Patient technically prediabetic.  Some elevated blood sugars due to marital stress.  Treating with sliding scale.

## 2021-08-10 NOTE — Assessment & Plan Note (Addendum)
Ejection fraction noted to be less than 20% on echocardiogram.  No evidence of left ventricular thrombus.  Weaned off of Cleviprex in ICU.  Entresto, Imdur, clonidine and Coreg continued.  BNP mildly elevated at 630.  Have ordered additional Lasix.  She is normally on 80 mg of p.o. Lasix at home.  To date, has diuresed over 10 L.

## 2021-08-10 NOTE — Assessment & Plan Note (Signed)
Secondary to rhabdomyolysis.  Creatinine at 1.73 on admission, resolved with fluids.

## 2021-08-10 NOTE — Assessment & Plan Note (Signed)
Not on any home medications.

## 2021-08-10 NOTE — Progress Notes (Signed)
Daily Progress Note   [name]       Date: 08/10/2021 DOB: [birth year]  Age: 55 y.o. MRN#: [id number] Attending Physician: Annita Brod, MD Primary Care Physician: Nolene Ebbs, MD Admit Date: 07/30/2021 Length of Stay: 11 days  Reason for Consultation/Follow-up: Establishing goals of care  HPI/Patient Profile:  55 y.o. female  with past medical history of HTN, chronic pain syndrome, bipolar disorder/depression. She was brought to the ED at [facility] on 12/13 with complaint of increasing somnolence, lethargy for last several days.  Work-up in the emergency department found febrile illness with elevated WBC count, AKI, rhabdomyolysis, lactic acidosis.  CT renal study showed emphysematous cystitis and a 4.7 cm soft tissue structure between the gastric fundus and left upper pole of the kidney.  She was admitted on 07/30/2021 with severe sepsis due to emphysematous cystitis, bilateral multifocal stroke, dysphagia, metabolic encephalopathy, acute on chronic biventricular heart failure, AKI, rhabdomyolysis, lactic acidosis, abdominal mass, and others.    PMT was consulted for goals of care conversation.  Subjective:   Subjective: Chart Reviewed. Updates received. Patient Assessed. Created space and opportunity for patient  and family to explore thoughts and feelings regarding current medical situation.  Today's Discussion: I saw the patient at bedside.  No family present at this time.  The patient does seem to be more awake.  When I discussed her being a good cook and that she is known for collard greens she said "yes" and smiled very vague.  She seems to be more interactive, perhaps change in neuro medicines is helping.  Intermittently follows commands as well.  I asked her if she is hurting and she did not really answer but does not appear to be in any distress or discomfort.  I held her hand, talk to her, showed compassion and spoke to her of her family being here  frequently to visit and love her.  She seems to smile for this.  Review of Systems  Unable to perform ROS: Mental status change   Objective:   Vital Signs:  BP (!) 158/125 (BP Location: Left Arm)    Pulse 88    Temp 98.1 F (36.7 C) (Oral)    Resp 16    Ht 5\' 6"  (1.676 m)    Wt 99.2 kg    SpO2 100%    BMI 35.30 kg/m   Physical Exam: Physical Exam Vitals and nursing note reviewed.  Constitutional:      Appearance: She is ill-appearing.  HENT:     Head: Normocephalic and atraumatic.  Cardiovascular:     Rate and Rhythm: Normal rate.  Pulmonary:     Effort: Pulmonary effort is normal. No respiratory distress.     Breath sounds: No wheezing or rhonchi.  Abdominal:     General: Abdomen is flat.     Palpations: Abdomen is soft.  Skin:    General: Skin is warm and dry.  Neurological:     Mental Status: She is alert. She is disoriented.    Palliative Assessment/Data: 30%   Assessment & Plan:   Impression: Present on Admission:  Acute encephalopathy  Acute ischemic stroke (HCC)  (Resolved) Sepsis (HCC)  AKI (acute kidney injury) ([place])  Rhabdomyolysis  Essential hypertension  SIRS (systemic inflammatory response syndrome) (HCC)  Mass of soft tissue of abdomen  Chronic combined systolic and diastolic CHF (congestive heart failure) (HCC)  Bipolar II disorder (HCC)  MDD (major depressive disorder)  55 year old female with multiple acute on chronic comorbidities  as described in consult note.  Her biggest issues currently are acute on chronic biventricular failure with Alvarez EF estimated to be less than 20%, although a TEE is planned for (has been cancelled x 1-2 times).  Also a major driving focus is bilateral multifocal pneumonia essentially unresponsive, not following commands but does move extremities spontaneously, sat edge of bed and took a few bites of food today. Deemed overall poor prognosis.  PEG placed yesterday.   Appears Tube Feed education has been completed in the  hospital. DME delivery being arranged. Ongoing d/c planning in the works.  SUMMARY OF RECOMMENDATIONS   Continue full code Continue full scope of treatment Continue to work with social work to arrange adequate home support Continue family support PMT will continue to follow  Code Status: Full code  Prognosis: Unable to determine  Discharge Planning: Home with Home Health  Discussed with: Patient family, nursing staff, medical staff  Thank you for allowing [place] to participate in the care of Dorothy Alvarez PMT will continue to support holistically.  Time Total: 35 min  Visit consisted of counseling and education dealing with the complex and emotionally intense issues of symptom management and palliative care in the setting of serious and potentially life-threatening illness. Greater than 50%  of this time was spent counseling and coordinating care related to the above assessment and plan.  Walden Field, NP Palliative Medicine Team  Team Phone # [phone] (Nights/Weekends)  04/16/2021, 8:17 AM

## 2021-08-10 NOTE — Progress Notes (Signed)
Patient husband Archie refused insulin, patient blood sugar 235. RN educated on benefits of insulin regulation. MD Maryland Pink notified by chat and responded. Will continue to monitor.

## 2021-08-10 NOTE — Assessment & Plan Note (Signed)
CPK of 8000 on admission, likely related to prolonged immobility.  Also had AKI.  Improved with hydration.

## 2021-08-10 NOTE — Assessment & Plan Note (Signed)
Patient meets criteria BMI greater than 35+ comorbidities of diabetes, CVA, hypertension

## 2021-08-10 NOTE — Hospital Course (Addendum)
55 year old female with past medical history of morbid obesity, bipolar disorder, hypertension presented to the emergency room on 12/13 with increased somnolence and lethargy.  Found to have septic shock and CT scan noted emphysematous cystitis.  Work-up also revealed 4.7 cm rounded soft tissue structure between gastric fundus and left upper pole of kidney in the abdomen.  MRI of brain noted diffusion defect concerning for acute left PCA infarct.  Patient mated to hospitalist service.  Septic shock treated with aggressive IV fluids and antibiotics.  Completed antibiotics on 12/22.  Further work-up by neurology for CVA revealed bilateral multifocal stroke with infarcts in the left PCA, right cerebellum, bilateral frontal and right thalamus.  Patient found to have some dysphagia.  PEG tube placed on 12/22 and started on tube feeds.  Started on aggressive Lipitor.  Initially put on aspirin, but once PEG tube placed, switched over to Eliquis given multi embolic nature of stroke.  Patient continues to remain with some confusion, although improving.  PT recommended skilled nursing, but family wants to take home.  Noted to have some persistently elevated blood pressures and found to have an elevated BNP of 600 on 12/25.  Started on IV Lasix and has diuresed several liters just in the past few days.Marland Kitchen

## 2021-08-10 NOTE — Progress Notes (Signed)
Husband Archie insisted Rns give all meds by tube by gravity feed, and never by using syringe by pushing, no matter how slowly or gently. Husband states an RN told him this was the only safe way to do it, that it "could come back on her" if it was not done by gravity. RN attempted to educate that both methods are commonly used, and can be used safely. Husband adamant. RN complied. Was in patient room about approximately an additional 15 minutes, as Osmolite ordered, and it is thick and gravity feeds very slowly.

## 2021-08-10 NOTE — Progress Notes (Addendum)
Husband Archie refused to allow RN to give 3 units insulin to patient. RN explained blood sugar was high, 211 and benefits of regulated blood sugar for health and healing. Husband said he would not allow insulin to be given until he was told by the MD why she is getting insulin. "She does not take insulin at home so why is she getting it now?" MD Maryland Pink contacted by chat, and MD responded will come talk with husband.

## 2021-08-10 NOTE — Assessment & Plan Note (Signed)
Secondary to CVA.  PEG tube placed 12/21, currently on tube feeds.

## 2021-08-10 NOTE — Progress Notes (Signed)
[facility] Progress Note  Patient: Dorothy Alvarez    VXB:[id number]  DOA: 07/30/2021    Date of Service: the patient was seen and examined on 08/10/2021  Brief hospital course: 55 year old female with past medical history of morbid obesity, bipolar disorder, hypertension presented to the emergency room on 12/13 with increased somnolence and lethargy.  Found to have septic shock and CT scan noted emphysematous cystitis.  Work-up also revealed 4.7 cm rounded soft tissue structure between gastric fundus and left upper pole of kidney in the abdomen.  MRI of brain noted diffusion defect concerning for acute left PCA infarct.  Patient mated to hospitalist service.  Septic shock treated with aggressive IV fluids and antibiotics.  Completed antibiotics on 12/22.  Further work-up by neurology for CVA revealed bilateral multifocal stroke with infarcts in the left PCA, right cerebellum, bilateral frontal and right thalamus.  Patient found to have some dysphagia.  PEG tube placed on 12/22.  Started on aggressive Lipitor.  Initially put on aspirin, but once PEG tube placed, switched over to Eliquis given multi embolic nature of stroke.  Patient continues to remain with some confusion.  Started to follow some physical therapy.  Recommendations for skilled nursing but family refuses and would like to take her home.  Assessment and Plan: Cardiovascular and Mediastinum Essential hypertension Assessment & Plan As above.  Allowing for some permissive hypertension.  On home medications.  Acute on chronic combined systolic and diastolic CHF (congestive heart failure) (HCC) Assessment & Plan Ejection fraction noted to be less than 20% on echocardiogram.  No evidence of left ventricular thrombus.  Weaned off of Cleviprex in ICU.  Entresto, Imdur, clonidine and Coreg continued.  Acute ischemic stroke [facility]) Assessment & Plan Multifocal infarcts including left PCA, right cerebellum and bilateral frontal and right  thalamus.  MRI noted no large vessel occlusion, but did note 50% stenosis of left PCA.  Carotid duplex noted nonsignificant stenosis bilaterally.  Echo noted no left ventricular thrombus.  A1c at 6.4, LDL at 141.  Currently patient unable to follow many commands, has NG tube for feeding.  Started on aspirin 325, then changed to Eliquis following PEG tube placement.  On Lipitor 80 mg  Digestive Dysphagia following cerebral infarction Assessment & Plan Secondary to CVA.  PEG tube placed 12/21, currently on tube feeds.  Nervous and Auditory * Acute encephalopathy Assessment & Plan Multifactorial from CVA plus polypharmacy at home plus AKI plus sepsis.  Patient starting to follow some commands and participate with therapy.  Musculoskeletal and Integument Rhabdomyolysis Assessment & Plan CPK of 8000 on admission, likely related to prolonged immobility.  Also had AKI.  Improved with hydration.  Genitourinary AKI (acute kidney injury) [facility]) Assessment & Plan Secondary to rhabdomyolysis.  Creatinine at 1.73 on admission, resolved with fluids.  Other MDD (major depressive disorder) Assessment & Plan Not on any home medications.  Hyperglycemia Assessment & Plan A1c of 6.4.  With tube feeds and medical issues, having hypoglycemic episodes.  Sliding scale insulin although at times patient's husband resists this  Morbid obesity ([place]) Assessment & Plan Patient meets criteria BMI greater than 35+ comorbidities of diabetes, CVA, hypertension  Bipolar II disorder ([place]) Assessment & Plan Normally on Seroquel.  Held currently due to alertness status.  May need to restart later on.  Mass of soft tissue of abdomen Assessment & Plan Incidental finding of 4.7 cm soft tissue mass on renal study CT.  As mentation and prognosis improved, will get CT of abdomen pelvis with  IV and oral contrast as outpatient.  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  Urine culture grew out 10,000 colonies of Enterococcus.  Patient met criteria for septic shock on admission given leukocytosis, fever, tachycardia and respiratory rate plus lactic acid level of 4.5 on admission.  Treated aggressively with fluids and antibiotics.  Completed 7-day course of IV ampicillin by 12/22.  Still with Foley catheter though, unable to remove catheter given mobility and poor mentation.    Body mass index is 35.3 kg/m.  Nutrition Problem: Inadequate oral intake Etiology: acute illness     Consultants: Neurology Interventional radiology Palliative care  Procedures: Echocardiogram: Ejection fraction 23%, diastolic dysfunction  Antimicrobials: IV ampicillin x7 days, course completed  Code Status: Full code   Subjective: Patient nonverbal at this time  Objective: Vital signs were reviewed and unremarkable. Vitals:   08/10/21 1228 08/10/21 1551  BP: (!) 140/96 (!) 143/109  Pulse: 82 78  Resp: 20 20  Temp: 98 F (36.7 C) 97.7 F (36.5 C)  SpO2: 100% 100%    Intake/Output Summary (Last 24 hours) at 08/10/2021 1627 Last data filed at 08/10/2021 0900 Gross per 24 hour  Intake --  Output 350 ml  Net -350 ml   Filed Weights   08/04/21 0500 08/06/21 0500 08/10/21 0500  Weight: 99.1 kg 101.5 kg 99.2 kg   Body mass index is 35.3 kg/m.  Exam:  General: Awake, follows some commands although not consistently HEENT: Normocephalic, atraumatic, mucous membranes slightly dry Cardiovascular: Regular rate and rhythm, S1-S2 2 out of 6 systolic ejection murmur Respiratory: Clear to auscultation bilaterally Abdomen: Soft, nondistended, hypoactive bowel sounds, PEG tube in place Musculoskeletal: No clubbing or cyanosis or edema Skin: No skin breaks, tears or lesions Psychiatry: Given her nonverbal status and mentation issues, difficult to assess, does not appear to be in acute psychosis Neurology: Moves extremities, not able to follow commands  consistently  Data Reviewed: My review of labs, imaging, notes and other tests shows no new significant findings.   Disposition:  Status is: Inpatient  Remains inpatient appropriate because: Final set up for home health and equipment availability will be on Monday 2, 12/26   Family Communication: Husband at the bedside  DVT Prophylaxis: Place and maintain sequential compression device Start: 08/01/21 1458 apixaban (ELIQUIS) tablet 5 mg    Author: Annita Brod ,MD 08/10/2021 4:27 PM  To reach On-call, see care teams to locate the attending and reach out via [email or web address]. Between 7PM-7AM, please contact night-coverage If you still have difficulty reaching the attending provider, please page the [facility] (Director on Call) for [facility] on amion for assistance.

## 2021-08-10 NOTE — Assessment & Plan Note (Signed)
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  Urine culture grew out 10,000 colonies of Enterococcus.  Patient met criteria for septic shock on admission given leukocytosis, fever, tachycardia and respiratory rate plus lactic acid level of 4.5 on admission.  Treated aggressively with fluids and antibiotics.  Completed 7-day course of IV ampicillin by 12/22.  Still with Foley catheter though, unable to remove catheter given mobility and poor mentation.

## 2021-08-10 NOTE — Assessment & Plan Note (Addendum)
As above.  Allowing for some permissive hypertension.  On home medications.  Stay elevated due to volume overload.  Improving with IV Lasix.

## 2021-08-10 NOTE — Plan of Care (Signed)
°  Problem: Education: Goal: Knowledge of General Education information will improve Description: Including pain rating scale, medication(s)/side effects and non-pharmacologic comfort measures Outcome: Progressing   Problem: Health Behavior/Discharge Planning: Goal: Ability to manage health-related needs will improve Outcome: Progressing   Problem: Clinical Measurements: Goal: Ability to maintain clinical measurements within normal limits will improve Outcome: Progressing Goal: Will remain free from infection Outcome: Progressing Goal: Diagnostic test results will improve Outcome: Progressing Goal: Respiratory complications will improve Outcome: Progressing Goal: Cardiovascular complication will be avoided Outcome: Progressing   Problem: Activity: Goal: Risk for activity intolerance will decrease Outcome: Progressing   Problem: Nutrition: Goal: Adequate nutrition will be maintained Outcome: Progressing   Problem: Coping: Goal: Level of anxiety will decrease Outcome: Progressing   Problem: Elimination: Goal: Will not experience complications related to bowel motility Outcome: Progressing Goal: Will not experience complications related to urinary retention Outcome: Progressing   Problem: Pain Managment: Goal: General experience of comfort will improve Outcome: Progressing   Problem: Safety: Goal: Ability to remain free from injury will improve Outcome: Progressing   Problem: Skin Integrity: Goal: Risk for impaired skin integrity will decrease Outcome: Progressing   Problem: Ischemic Stroke/TIA Tissue Perfusion: Goal: Complications of ischemic stroke/TIA will be minimized Outcome: Progressing   Problem: Spontaneous Subarachnoid Hemorrhage Tissue Perfusion: Goal: Complications of Spontaneous Subarachnoid Hemorrhage will be minimized Outcome: Progressing

## 2021-08-10 NOTE — Assessment & Plan Note (Signed)
Incidental finding of 4.7 cm soft tissue mass on renal study CT.  As mentation and prognosis improved, will get CT of abdomen pelvis with IV and oral contrast as outpatient.

## 2021-08-10 NOTE — Assessment & Plan Note (Signed)
Multifocal infarcts including left PCA, right cerebellum and bilateral frontal and right thalamus.  MRI noted no large vessel occlusion, but did note 50% stenosis of left PCA.  Carotid duplex noted nonsignificant stenosis bilaterally.  Echo noted no left ventricular thrombus.  A1c at 6.4, LDL at 141.  Currently patient unable to follow many commands, has NG tube for feeding.  Started on aspirin 325, then changed to Eliquis following PEG tube placement.  On Lipitor 80 mg

## 2021-08-11 ENCOUNTER — Inpatient Hospital Stay (HOSPITAL_COMMUNITY): Payer: Medicare HMO

## 2021-08-11 DIAGNOSIS — D72829 Elevated white blood cell count, unspecified: Secondary | ICD-10-CM | POA: Diagnosis not present

## 2021-08-11 DIAGNOSIS — I69391 Dysphagia following cerebral infarction: Secondary | ICD-10-CM

## 2021-08-11 LAB — CBC
HCT: 45.3 % (ref 36.0–46.0)
Hemoglobin: 15.7 g/dL — ABNORMAL HIGH (ref 12.0–15.0)
MCH: 31.7 pg (ref 26.0–34.0)
MCHC: 34.7 g/dL (ref 30.0–36.0)
MCV: 91.3 fL (ref 80.0–100.0)
Platelets: 425 10*3/uL — ABNORMAL HIGH (ref 150–400)
RBC: 4.96 MIL/uL (ref 3.87–5.11)
RDW: 12.6 % (ref 11.5–15.5)
WBC: 15.2 10*3/uL — ABNORMAL HIGH (ref 4.0–10.5)
nRBC: 0 % (ref 0.0–0.2)

## 2021-08-11 LAB — GLUCOSE, CAPILLARY
Glucose-Capillary: 133 mg/dL — ABNORMAL HIGH (ref 70–99)
Glucose-Capillary: 145 mg/dL — ABNORMAL HIGH (ref 70–99)
Glucose-Capillary: 118 mg/dL — ABNORMAL HIGH (ref 70–99)
Glucose-Capillary: 265 mg/dL — ABNORMAL HIGH (ref 70–99)
Glucose-Capillary: 240 mg/dL — ABNORMAL HIGH (ref 70–99)

## 2021-08-11 LAB — BRAIN NATRIURETIC PEPTIDE: B Natriuretic Peptide: 631.3 pg/mL — ABNORMAL HIGH (ref 0.0–100.0)

## 2021-08-11 MED ORDER — SACUBITRIL-VALSARTAN 24-26 MG PO TABS
1.0000 | ORAL_TABLET | Freq: Two times a day (BID) | ORAL | Status: DC
  Administered 2021-08-11 – 2021-08-14 (×6): 1
  Filled 2021-08-11 (×7): qty 1

## 2021-08-11 MED ORDER — APIXABAN 5 MG PO TABS
5.0000 mg | ORAL_TABLET | Freq: Two times a day (BID) | ORAL | Status: DC
  Administered 2021-08-11 – 2021-08-14 (×6): 5 mg
  Filled 2021-08-11 (×6): qty 1

## 2021-08-11 MED ORDER — PANTOPRAZOLE 2 MG/ML SUSPENSION
40.0000 mg | Freq: Every day | ORAL | Status: DC
  Administered 2021-08-12 – 2021-08-14 (×3): 40 mg
  Filled 2021-08-11 (×3): qty 20

## 2021-08-11 MED ORDER — METHYLPHENIDATE HCL 5 MG PO TABS
5.0000 mg | ORAL_TABLET | Freq: Two times a day (BID) | ORAL | Status: DC
  Administered 2021-08-12 – 2021-08-14 (×6): 5 mg
  Filled 2021-08-11 (×6): qty 1

## 2021-08-11 NOTE — Progress Notes (Signed)
[facility] Progress Note  Patient: Dorothy Alvarez    MBW:[id number]  DOA: 07/30/2021    Date of Service: the patient was seen and examined on 08/11/2021  Brief hospital course: 55 year old female with past medical history of morbid obesity, bipolar disorder, hypertension presented to the emergency room on 12/13 with increased somnolence and lethargy.  Found to have septic shock and CT scan noted emphysematous cystitis.  Work-up also revealed 4.7 cm rounded soft tissue structure between gastric fundus and left upper pole of kidney in the abdomen.  MRI of brain noted diffusion defect concerning for acute left PCA infarct.  Patient mated to hospitalist service.  Septic shock treated with aggressive IV fluids and antibiotics.  Completed antibiotics on 12/22.  Further work-up by neurology for CVA revealed bilateral multifocal stroke with infarcts in the left PCA, right cerebellum, bilateral frontal and right thalamus.  Patient found to have some dysphagia.  PEG tube placed on 12/22.  Started on aggressive Lipitor.  Initially put on aspirin, but once PEG tube placed, switched over to Eliquis given multi embolic nature of stroke.  Patient continues to remain with some confusion.  Started to follow some physical therapy.  Recommendations for skilled nursing but family refuses and would like to take her home.    Assessment and Plan: Cardiovascular and Mediastinum Essential hypertension Assessment & Plan As above.  Allowing for some permissive hypertension.  On home medications.  Blood pressure still staying elevated.  Checking BNP  Acute on chronic combined systolic and diastolic CHF (congestive heart failure) (HCC) Assessment & Plan Ejection fraction noted to be less than 20% on echocardiogram.  No evidence of left ventricular thrombus.  Weaned off of Cleviprex in ICU.  Entresto, Imdur, clonidine and Coreg continued.  Checking BNP.  To date, has diuresed over 12 L  Acute ischemic stroke  [facility]) Assessment & Plan Multifocal infarcts including left PCA, right cerebellum and bilateral frontal and right thalamus.  MRI noted no large vessel occlusion, but did note 50% stenosis of left PCA.  Carotid duplex noted nonsignificant stenosis bilaterally.  Echo noted no left ventricular thrombus.  A1c at 6.4, LDL at 141.  Currently patient unable to follow many commands, has NG tube for feeding.  Started on aspirin 325, then changed to Eliquis following PEG tube placement.  On Lipitor 80 mg  Digestive Dysphagia following cerebral infarction Assessment & Plan Secondary to CVA.  PEG tube placed 12/21, currently on tube feeds.  Nervous and Auditory * Acute encephalopathy Assessment & Plan Multifactorial from CVA plus polypharmacy at home plus AKI plus sepsis.  Patient starting to follow some commands and participate with therapy.  Musculoskeletal and Integument Rhabdomyolysis Assessment & Plan CPK of 8000 on admission, likely related to prolonged immobility.  Also had AKI.  Improved with hydration.  Genitourinary AKI (acute kidney injury) [facility]) Assessment & Plan Secondary to rhabdomyolysis.  Creatinine at 1.73 on admission, resolved with fluids.  Other MDD (major depressive disorder) Assessment & Plan Not on any home medications.  Hyperglycemia Assessment & Plan A1c of 6.4.  With tube feeds and medical issues, having hypoglycemic episodes.  Sliding scale insulin although at times patient's husband resists this  Morbid obesity ([place]) Assessment & Plan Patient meets criteria BMI greater than 35+ comorbidities of diabetes, CVA, hypertension  Bipolar II disorder ([place]) Assessment & Plan Normally on Seroquel.  Held currently due to alertness status.  May need to restart later on.  Mass of soft tissue of abdomen Assessment & Plan Incidental finding of  4.7 cm soft tissue mass on renal study CT.  As mentation and prognosis improved, will get CT of abdomen pelvis with IV and oral  contrast as outpatient.  Leukocytosis Assessment & Plan White blood cell count which was normal in 12/22 increased to 14.1 yesterday and today at 15.2.  Patient remains afebrile.  Not on steroids.  Rechecking urinalysis (especially in light of continued Foley catheter).  Chest x-ray done which was unremarkable.  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  Urine culture grew out 10,000 colonies of Enterococcus.  Patient met criteria for septic shock on admission given leukocytosis, fever, tachycardia and respiratory rate plus lactic acid level of 4.5 on admission.  Treated aggressively with fluids and antibiotics.  Completed 7-day course of IV ampicillin by 12/22.  Still with Foley catheter though, unable to remove catheter given mobility and poor mentation.    Body mass index is 34.94 kg/m.  Nutrition Problem: Inadequate oral intake Etiology: acute illness     Consultants: Neurology Interventional radiology Palliative care  Procedures: Echocardiogram: Ejection fraction 41%, diastolic dysfunction  Antimicrobials: IV ampicillin x7 days, course completed  Code Status: Full code   Subjective: Patient nonverbal at this time  Objective: Vital signs were reviewed and unremarkable. Vitals:   08/11/21 0845 08/11/21 1229  BP: (!) 173/97 (!) 173/107  Pulse: 80 80  Resp: 20 20  Temp: 98.4 F (36.9 C) 97.9 F (36.6 C)  SpO2: 100% 99%    Intake/Output Summary (Last 24 hours) at 08/11/2021 1427 Last data filed at 08/11/2021 1133 Gross per 24 hour  Intake --  Output 2050 ml  Net -2050 ml    Filed Weights   08/06/21 0500 08/10/21 0500 08/11/21 0419  Weight: 101.5 kg 99.2 kg 98.2 kg   Body mass index is 34.94 kg/m.  Exam:  General: Resting comfortably HEENT: Normocephalic, atraumatic, mucous membranes slightly dry Cardiovascular: Regular rate and rhythm, S1-S2 2 out of 6 systolic ejection murmur Respiratory:  Clear to auscultation bilaterally Abdomen: Soft, nondistended, hypoactive bowel sounds, PEG tube in place Musculoskeletal: No clubbing or cyanosis or edema Skin: No skin breaks, tears or lesions Psychiatry: Given her nonverbal status and mentation issues, difficult to assess, does not appear to be in acute psychosis Neurology: Moves extremities, not able to follow commands consistently  Data Reviewed: My review of labs, imaging, notes and other tests shows no new significant findings.   Disposition:  Status is: Inpatient  Remains inpatient appropriate because: Final set up for home health and equipment availability will be on Monday 2, 12/26   Family Communication: Husband at the bedside  DVT Prophylaxis: Place and maintain sequential compression device Start: 08/01/21 1458 apixaban (ELIQUIS) tablet 5 mg    Author: Annita Brod ,MD 08/11/2021 2:27 PM  To reach On-call, see care teams to locate the attending and reach out via [email or web address]. Between 7PM-7AM, please contact night-coverage If you still have difficulty reaching the attending provider, please page the [facility] (Director on Call) for [facility] on amion for assistance.

## 2021-08-11 NOTE — Assessment & Plan Note (Addendum)
White blood cell count which was normal in 12/22 slowly increasing and peaked at 15.2 on 12/25.  Suspect secondary to stress margination from volume overload.  Has improved significantly with diuresis and no antibiotics.

## 2021-08-12 DIAGNOSIS — H5789 Other specified disorders of eye and adnexa: Secondary | ICD-10-CM | POA: Diagnosis not present

## 2021-08-12 LAB — URINALYSIS, ROUTINE W REFLEX MICROSCOPIC
Bilirubin Urine: NEGATIVE
Glucose, UA: NEGATIVE mg/dL
Ketones, ur: NEGATIVE mg/dL
Leukocytes,Ua: NEGATIVE
Nitrite: NEGATIVE
Protein, ur: 30 mg/dL — AB
Specific Gravity, Urine: 1.015 (ref 1.005–1.030)
pH: 8.5 — ABNORMAL HIGH (ref 5.0–8.0)

## 2021-08-12 LAB — COMPREHENSIVE METABOLIC PANEL
ALT: 42 U/L (ref 0–44)
AST: 34 U/L (ref 15–41)
Albumin: 3.1 g/dL — ABNORMAL LOW (ref 3.5–5.0)
Alkaline Phosphatase: 97 U/L (ref 38–126)
Anion gap: 8 (ref 5–15)
BUN: 15 mg/dL (ref 6–20)
CO2: 23 mmol/L (ref 22–32)
Calcium: 8.5 mg/dL — ABNORMAL LOW (ref 8.9–10.3)
Chloride: 107 mmol/L (ref 98–111)
Creatinine, Ser: 0.86 mg/dL (ref 0.44–1.00)
GFR, Estimated: >60 mL/min (ref >60)
Glucose, Bld: 222 mg/dL — ABNORMAL HIGH (ref 70–99)
Potassium: 4.6 mmol/L (ref 3.5–5.1)
Sodium: 138 mmol/L (ref 135–145)
Total Bilirubin: 0.6 mg/dL (ref 0.3–1.2)
Total Protein: 5.8 g/dL — ABNORMAL LOW (ref 6.5–8.1)

## 2021-08-12 LAB — GLUCOSE, CAPILLARY
Glucose-Capillary: 153 mg/dL — ABNORMAL HIGH (ref 70–99)
Glucose-Capillary: 207 mg/dL — ABNORMAL HIGH (ref 70–99)
Glucose-Capillary: 150 mg/dL — ABNORMAL HIGH (ref 70–99)
Glucose-Capillary: 222 mg/dL — ABNORMAL HIGH (ref 70–99)

## 2021-08-12 LAB — CBC
HCT: 39.8 % (ref 36.0–46.0)
Hemoglobin: 13.6 g/dL (ref 12.0–15.0)
MCH: 31.6 pg (ref 26.0–34.0)
MCHC: 34.2 g/dL (ref 30.0–36.0)
MCV: 92.3 fL (ref 80.0–100.0)
Platelets: 392 10*3/uL (ref 150–400)
RBC: 4.31 MIL/uL (ref 3.87–5.11)
RDW: 12.8 % (ref 11.5–15.5)
WBC: 14.6 10*3/uL — ABNORMAL HIGH (ref 4.0–10.5)
nRBC: 0 % (ref 0.0–0.2)

## 2021-08-12 LAB — URINALYSIS, MICROSCOPIC (REFLEX): RBC / HPF: >50 RBC/hpf (ref 0–5)

## 2021-08-12 LAB — PROCALCITONIN: Procalcitonin: 0.45 ng/mL

## 2021-08-12 MED ORDER — FUROSEMIDE 10 MG/ML IJ SOLN
40.0000 mg | Freq: Once | INTRAMUSCULAR | Status: AC
  Administered 2021-08-12: 13:00:00 40 mg via INTRAVENOUS
  Filled 2021-08-12: qty 4

## 2021-08-12 MED ORDER — FUROSEMIDE 10 MG/ML IJ SOLN
20.0000 mg | Freq: Two times a day (BID) | INTRAMUSCULAR | Status: DC
  Administered 2021-08-12 – 2021-08-13 (×2): 20 mg via INTRAVENOUS
  Filled 2021-08-12 (×2): qty 4

## 2021-08-12 MED ORDER — NAPHAZOLINE-GLYCERIN 0.012-0.25 % OP SOLN
1.0000 [drp] | Freq: Four times a day (QID) | OPHTHALMIC | Status: DC | PRN
Start: 2021-08-12 — End: 2021-08-14
  Administered 2021-08-12: 19:00:00 2 [drp] via OPHTHALMIC
  Filled 2021-08-12: qty 15

## 2021-08-12 NOTE — TOC Progression Note (Addendum)
Transition of Care [facility]) - Progression Note    Patient Details  Name: Dorothy Alvarez MRN: [id number] Date of Birth: 1966-06-07  Transition of Care [facility]) CM/SW Contact  Loletha Grayer Beverely Pace, RN Phone Number: 08/12/2021, 10:47 AM  Clinical Narrative:   Case Manager contacted patient's husband, Dorothy Alvarez [phone] to find out if room was ready at the home. He states he is ready for delivery. CM contacted Mardene Celeste with [employer] and requested that DME be delivered today, confirmed orders for hospital bed,lift, wheelchair and 3in1.  Mr. Marga Hoots will contact Case Manager when equipment has arrived so we can arrange for patient transport home. Medical Necessity form completed. 1:15pm Patient's husband states that he has received the tube feeds and is waiting for equipment. CM told him that patient may not discharge today.    Expected Discharge Plan: [place] Barriers to Discharge: Continued Medical Work up  Expected Discharge Plan and Services Expected Discharge Plan: [place] Choice: Home Health, Durable Medical Equipment Living arrangements for the past 2 months: Single Family Home                                       Social Determinants of Health (SDOH) Interventions    Readmission Risk Interventions No flowsheet data found.

## 2021-08-12 NOTE — Progress Notes (Signed)
This nurse called MD  and notified him regarding pt BP is 148/104 and that pt has PRN but for SBP > 160 or SBP >180. MD stated pt DBP will be treated when her DBP is greater than 110.

## 2021-08-12 NOTE — Assessment & Plan Note (Signed)
Looks like this is developed over the last 24 hours.  Eye looks irritated, but no pain or changes in vision.  We will try eyedrops.

## 2021-08-12 NOTE — Progress Notes (Signed)
[facility] Progress Note  Patient: Dorothy Alvarez    JJH:[id number]  DOA: 07/30/2021    Date of Service: the patient was seen and examined on 08/12/2021  Brief hospital course: 55 year old female with past medical history of morbid obesity, bipolar disorder, hypertension presented to the emergency room on 12/13 with increased somnolence and lethargy.  Found to have septic shock and CT scan noted emphysematous cystitis.  Work-up also revealed 4.7 cm rounded soft tissue structure between gastric fundus and left upper pole of kidney in the abdomen.  MRI of brain noted diffusion defect concerning for acute left PCA infarct.  Patient mated to hospitalist service.  Septic shock treated with aggressive IV fluids and antibiotics.  Completed antibiotics on 12/22.  Further work-up by neurology for CVA revealed bilateral multifocal stroke with infarcts in the left PCA, right cerebellum, bilateral frontal and right thalamus.  Patient found to have some dysphagia.  PEG tube placed on 12/22.  Started on aggressive Lipitor.  Initially put on aspirin, but once PEG tube placed, switched over to Eliquis given multi embolic nature of stroke.  Patient continues to remain with some confusion.  Started to follow some physical therapy.  Recommendations for skilled nursing but family refuses and would like to take her home.    Assessment and Plan: Cardiovascular and Mediastinum Essential hypertension Assessment & Plan As above.  Allowing for some permissive hypertension.  On home medications.  Blood pressure still staying elevated.  Should improve with diuresis.  Acute on chronic combined systolic and diastolic CHF (congestive heart failure) (HCC) Assessment & Plan Ejection fraction noted to be less than 20% on echocardiogram.  No evidence of left ventricular thrombus.  Weaned off of Cleviprex in ICU.  Entresto, Imdur, clonidine and Coreg continued.  BNP mildly elevated at 630.  Have ordered additional Lasix.  To  date, has diuresed over 7.5 L.  Acute ischemic stroke [facility]) Assessment & Plan Multifocal infarcts including left PCA, right cerebellum and bilateral frontal and right thalamus.  MRI noted no large vessel occlusion, but did note 50% stenosis of left PCA.  Carotid duplex noted nonsignificant stenosis bilaterally.  Echo noted no left ventricular thrombus.  A1c at 6.4, LDL at 141.  Currently patient unable to follow many commands, has NG tube for feeding.  Started on aspirin 325, then changed to Eliquis following PEG tube placement.  On Lipitor 80 mg  Digestive Dysphagia following cerebral infarction Assessment & Plan Secondary to CVA.  PEG tube placed 12/21, currently on tube feeds.  Nervous and Auditory * Acute encephalopathy Assessment & Plan Multifactorial from CVA plus polypharmacy at home plus AKI plus sepsis.  Patient continues to show improvements, starting to follow some commands and participate with therapy.  She is much more alert today.  Musculoskeletal and Integument Rhabdomyolysis Assessment & Plan CPK of 8000 on admission, likely related to prolonged immobility.  Also had AKI.  Improved with hydration.  Genitourinary AKI (acute kidney injury) [facility]) Assessment & Plan Secondary to rhabdomyolysis.  Creatinine at 1.73 on admission, resolved with fluids.  Other MDD (major depressive disorder) Assessment & Plan Not on any home medications.  Hyperglycemia Assessment & Plan A1c of 6.4.  With tube feeds and medical issues, having hypoglycemic episodes.  Sliding scale insulin although at times patient's husband resists this  Morbid obesity ([place]) Assessment & Plan Patient meets criteria BMI greater than 35+ comorbidities of diabetes, CVA, hypertension  Bipolar II disorder ([place]) Assessment & Plan Normally on Seroquel.  Held currently due to alertness  status.  May need to restart later on.  Redness of right eye Assessment & Plan Looks like this is developed over the last 24  hours.  Eye looks irritated, but no pain or changes in vision.  We will try eyedrops.  Mass of soft tissue of abdomen Assessment & Plan Incidental finding of 4.7 cm soft tissue mass on renal study CT.  As mentation and prognosis improved, will get CT of abdomen pelvis with IV and oral contrast as outpatient.  Leukocytosis Assessment & Plan White blood cell count which was normal in 12/22 slowly increasing and peaked at 15.2 on 12/25.  Down to 14.6 today without antibiotics,.  Patient remains afebrile.  Not on steroids.  Chest x-ray and urinalysis unremarkable.  Checking procalcitonin level.  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  Urine culture grew out 10,000 colonies of Enterococcus.  Patient met criteria for septic shock on admission given leukocytosis, fever, tachycardia and respiratory rate plus lactic acid level of 4.5 on admission.  Treated aggressively with fluids and antibiotics.  Completed 7-day course of IV ampicillin by 12/22.  Still with Foley catheter though, unable to remove catheter given mobility and poor mentation.     Body mass index is 34.94 kg/m.  Nutrition Problem: Inadequate oral intake Etiology: acute illness     Consultants: Neurology Interventional radiology Palliative care  Procedures: Echocardiogram: Ejection fraction 75%, diastolic dysfunction  Antimicrobials: IV ampicillin x7 days, course completed  Code Status: Full code   Subjective: Patient able to converse somewhat.  Denies any complaints.  Objective: Vital signs were reviewed and unremarkable. Vitals:   08/12/21 0300 08/12/21 1302  BP: (!) 159/99 (!) 136/92  Pulse: 84 81  Resp: 18   Temp: 98.3 F (36.8 C) 98.4 F (36.9 C)  SpO2: 100% 100%    Intake/Output Summary (Last 24 hours) at 08/12/2021 1341 Last data filed at 08/12/2021 0054 Gross per 24 hour  Intake --  Output 700 ml  Net -700 ml    Filed Weights    08/06/21 0500 08/10/21 0500 08/11/21 0419  Weight: 101.5 kg 99.2 kg 98.2 kg   Body mass index is 34.94 kg/m.  Exam:  General: Awake, oriented x2, no acute distress HEENT: Normocephalic, atraumatic, mucous membranes slightly dry Cardiovascular: Regular rate and rhythm, S1-S2 2 out of 6 systolic ejection murmur Respiratory: Clear to auscultation bilaterally Abdomen: Soft, nondistended, hypoactive bowel sounds, PEG tube in place Musculoskeletal: No clubbing or cyanosis or edema Skin: No skin breaks, tears or lesions Psychiatry: Given her nonverbal status and mentation issues, difficult to assess, does not appear to be in acute psychosis Neurology: Moves extremities, follows some commands, better today  Data Reviewed: My review of labs, imaging, notes and other tests shows no new significant findings.   Disposition:  Status is: Inpatient  Remains inpatient appropriate because: Need to diurese from heart failure, rule out infection   Family Communication: Mother at the bedside  DVT Prophylaxis: Place and maintain sequential compression device Start: 08/01/21 1458 apixaban (ELIQUIS) tablet 5 mg    Author: Annita Brod ,MD 08/12/2021 1:41 PM  To reach On-call, see care teams to locate the attending and reach out via [email or web address]. Between 7PM-7AM, please contact night-coverage If you still have difficulty reaching the attending provider, please page the [facility] (Director on Call) for [facility] on amion for assistance.

## 2021-08-12 NOTE — Plan of Care (Signed)
°  Problem: Health Behavior/Discharge Planning: Goal: Ability to manage health-related needs will improve Outcome: Progressing   Problem: Health Behavior/Discharge Planning: Goal: Ability to manage health-related needs will improve Outcome: Progressing   Problem: Clinical Measurements: Goal: Ability to maintain clinical measurements within normal limits will improve Outcome: Progressing Goal: Will remain free from infection Outcome: Progressing Goal: Diagnostic test results will improve Outcome: Progressing Goal: Respiratory complications will improve Outcome: Progressing Goal: Cardiovascular complication will be avoided Outcome: Progressing   Problem: Ischemic Stroke/TIA Tissue Perfusion: Goal: Complications of ischemic stroke/TIA will be minimized Outcome: Progressing

## 2021-08-13 DIAGNOSIS — I5043 Acute on chronic combined systolic (congestive) and diastolic (congestive) heart failure: Secondary | ICD-10-CM | POA: Diagnosis not present

## 2021-08-13 DIAGNOSIS — G9340 Encephalopathy, unspecified: Secondary | ICD-10-CM | POA: Diagnosis not present

## 2021-08-13 DIAGNOSIS — I69391 Dysphagia following cerebral infarction: Secondary | ICD-10-CM | POA: Diagnosis not present

## 2021-08-13 DIAGNOSIS — I639 Cerebral infarction, unspecified: Secondary | ICD-10-CM | POA: Diagnosis not present

## 2021-08-13 LAB — BASIC METABOLIC PANEL
Anion gap: 9 (ref 5–15)
BUN: 15 mg/dL (ref 6–20)
CO2: 24 mmol/L (ref 22–32)
Calcium: 8.9 mg/dL (ref 8.9–10.3)
Chloride: 102 mmol/L (ref 98–111)
Creatinine, Ser: 0.86 mg/dL (ref 0.44–1.00)
GFR, Estimated: >60 mL/min (ref >60)
Glucose, Bld: 219 mg/dL — ABNORMAL HIGH (ref 70–99)
Potassium: 4.1 mmol/L (ref 3.5–5.1)
Sodium: 135 mmol/L (ref 135–145)

## 2021-08-13 LAB — GLUCOSE, CAPILLARY
Glucose-Capillary: 140 mg/dL — ABNORMAL HIGH (ref 70–99)
Glucose-Capillary: 209 mg/dL — ABNORMAL HIGH (ref 70–99)
Glucose-Capillary: 270 mg/dL — ABNORMAL HIGH (ref 70–99)
Glucose-Capillary: 293 mg/dL — ABNORMAL HIGH (ref 70–99)
Glucose-Capillary: 271 mg/dL — ABNORMAL HIGH (ref 70–99)

## 2021-08-13 LAB — CBC
HCT: 40.8 % (ref 36.0–46.0)
Hemoglobin: 13.8 g/dL (ref 12.0–15.0)
MCH: 30.9 pg (ref 26.0–34.0)
MCHC: 33.8 g/dL (ref 30.0–36.0)
MCV: 91.3 fL (ref 80.0–100.0)
Platelets: 426 10*3/uL — ABNORMAL HIGH (ref 150–400)
RBC: 4.47 MIL/uL (ref 3.87–5.11)
RDW: 12.8 % (ref 11.5–15.5)
WBC: 12.5 10*3/uL — ABNORMAL HIGH (ref 4.0–10.5)
nRBC: 0 % (ref 0.0–0.2)

## 2021-08-13 MED ORDER — FUROSEMIDE 10 MG/ML IJ SOLN
40.0000 mg | Freq: Two times a day (BID) | INTRAMUSCULAR | Status: DC
  Administered 2021-08-13 – 2021-08-14 (×2): 40 mg via INTRAVENOUS
  Filled 2021-08-13 (×2): qty 4

## 2021-08-13 MED ORDER — INSULIN ASPART 100 UNIT/ML IJ SOLN
0.0000 [IU] | Freq: Every day | INTRAMUSCULAR | Status: DC
  Administered 2021-08-13: 23:00:00 3 [IU] via SUBCUTANEOUS

## 2021-08-13 MED ORDER — INSULIN ASPART 100 UNIT/ML IJ SOLN
0.0000 [IU] | Freq: Three times a day (TID) | INTRAMUSCULAR | Status: DC
  Administered 2021-08-13: 18:00:00 8 [IU] via SUBCUTANEOUS
  Administered 2021-08-14: 07:00:00 2 [IU] via SUBCUTANEOUS
  Administered 2021-08-14: 12:00:00 8 [IU] via SUBCUTANEOUS

## 2021-08-13 NOTE — Progress Notes (Signed)
Physical Therapy Treatment Patient Details Name: Dorothy Alvarez MRN: [id number] DOB: 15-Mar-1966 Today's Date: 08/13/2021   History of Present Illness Pt is a 55 y/o female [facility] ED on 12/13 for lethargy and confusion x 2 weeks. Found to have AKI, rhabdo, and emphysematous cystitis. MRI showed acute L PCA infarct, additional punctate areas of restricted diffusion in bilateral cerebral, R cerebellar, and medial R thalamus, and encephalomalacia in medial R occipital lobe, R posterior hippocampus, and L frontoparietal region. PMH: HTN, chronic pain syndrome, bipolar disorder, anxiety    PT Comments    Pt received in supine, lethargic and following ~25% of simple commands, restless and impulsive but participatory with encouragement. Pt needing up to maxA +2 for bed mobility and not following instructions for scooting or standing transfers this date. Pt with poor seated balance and noted decreased/poor righting reactions at times. Discussed pressure relief strategies/frequency with her daughter Phineas Real who was present in room and receptive to instruction. Pt continues to benefit from PT services to progress toward functional mobility goals.    Recommendations for follow up therapy are one component of a multi-disciplinary discharge planning process, led by the attending physician.  Recommendations may be updated based on patient status, additional functional criteria and insurance authorization.  Follow Up Recommendations  Skilled nursing-short term rehab (<3 hours/day)     Assistance Recommended at Discharge Frequent or [place] [facility] bed;Other (comment) (MECHANICAL LIFT)    Recommendations for Other Services       Precautions / Restrictions Precautions Precautions: Fall Precaution Comments: PEG tube Restrictions Weight Bearing Restrictions: No     Mobility  Bed Mobility Overal bed mobility: Needs Assistance Bed Mobility:  Sidelying to Sit;Rolling;Sit to Sidelying Rolling: Mod assist Sidelying to sit: Max assist     Sit to sidelying: Max assist;+2 for physical assistance General bed mobility comments: required assistance for trunk and LEs.  Partial participation with patient with moving legs towards EOB and grasping rail but not understanding cues for lifting trunk until maxA and multimodal cues given    Transfers                   General transfer comment: defer for safety, pt too lethargic and not following instructions well enough to safely attempt. pt impulsively lying back down at times while seated EOB    Ambulation/Gait                   Stairs             Wheelchair Mobility    Modified Rankin (Stroke Patients Only) Modified Rankin (Stroke Patients Only) Pre-Morbid Rankin Score: No symptoms Modified Rankin: Severe disability     Balance Overall balance assessment: Needs assistance Sitting-balance support: Single extremity supported;Bilateral upper extremity supported;Feet supported Sitting balance-Leahy Scale: Poor Sitting balance - Comments: patient would impulsively  lean forward or to Rt side and required assistance for balance to prevent fall.  Able to maintain sitting balance intermittently with close Supervision but mostly min guard to modA                                    Cognition Arousal/Alertness: Lethargic Behavior During Therapy: Restless;Impulsive Overall Cognitive Status: Difficult to assess Area of Impairment: Orientation;Attention;Memory;Following commands;Safety/judgement;Awareness;Problem solving                 Orientation Level:  (not answering orientation questions)  Current Attention Level: Focused   Following Commands: Follows one step commands inconsistently Safety/Judgement: Decreased awareness of safety;Decreased awareness of deficits Awareness: Intellectual Problem Solving: Slow processing;Decreased  initiation;Difficulty sequencing;Requires verbal cues;Requires tactile cues General Comments: does better with tactile cues, pt frequently closing eyes and responding to <25% of simple commands. attends better to daughter Phineas Real who was present in room.        Exercises Low Level/ICU Exercises Heel Slides: AAROM;Left;5 reps;Supine Other Exercises Other Exercises: seated LLE AROM: LAQ x15 reps, AAROM to AROM on RLE x10 reps Other Exercises: seated AAROM: hip flexion x10 reps Other Exercises: RUE AAROM: shoulder flexion, elbow flex/ext x5 reps ea    General Comments General comments (skin integrity, edema, etc.): pt denies dizziness, no acute s/sx distress      Pertinent Vitals/Pain Pain Assessment: No/denies pain Faces Pain Scale: No hurt Pain Intervention(s): Monitored during session;Repositioned    Home Living                          Prior Function            PT Goals (current goals can now be found in the care plan section) Acute Rehab PT Goals Patient Stated Goal: Unable to state this session, per daughter to take her home. PT Goal Formulation: With family Time For Goal Achievement: 08/16/21 Progress towards PT goals: Progressing toward goals    Frequency    Min 3X/week      PT Plan Current plan remains appropriate    Co-evaluation              AM-PAC PT "6 Clicks" Mobility   Outcome Measure  Help needed turning from your back to your side while in a flat bed without using bedrails?: A Lot Help needed moving from lying on your back to sitting on the side of a flat bed without using bedrails?: Total Help needed moving to and from a bed to a chair (including a wheelchair)?: Total Help needed standing up from a chair using your arms (e.g., wheelchair or bedside chair)?: Total Help needed to walk in hospital room?: Total Help needed climbing 3-5 steps with a railing? : Total 6 Click Score: 7    End of Session   Activity Tolerance: Patient  limited by lethargy Patient left: in bed;with call bell/phone within reach;with family/visitor present;Other (comment);with bed alarm set (daughter Phineas Real in room to assist her with cleaning her hands) Nurse Communication: Mobility status PT Visit Diagnosis: Muscle weakness (generalized) (M62.81);Unsteadiness on feet (R26.81);Other abnormalities of gait and mobility (R26.89);Other symptoms and signs involving the nervous system (R29.898)     Time: 4496-7591 PT Time Calculation (min) (ACUTE ONLY): 29 min  Charges:  $Therapeutic Exercise: 8-22 mins $Therapeutic Activity: 8-22 mins                     Kahley Leib P., PTA Acute Rehabilitation Services Pager: [phone] Office: [place] 08/13/2021, 3:30 PM

## 2021-08-13 NOTE — Progress Notes (Signed)
Speech Language Pathology Treatment: Dysphagia;Cognitive-Linquistic  Patient Details Name: Dorothy Alvarez MRN: [id number] DOB: 30-Jan-1966 Today's Date: 08/13/2021 Time: 1557-1610 SLP Time Calculation (min) (ACUTE ONLY): 13 min  Assessment / Plan / Recommendation Clinical Impression  Pt consumed chopped pieces of fruit and thin liquids with pocketing noted in her R buccal cavity, requiring verbal and tactile cueing to clear. Her daughter says that she has been eating only small amounts off her tray, and that she believes she starts to notice pocketing the most when she thinks her mom is ready to be done eating. No overt s/s of aspiration noted today, but may want to see her pocketing or at least clearing pocketed material more spontaneously prior to advancing diet.   Attempted to perform cognitive and communicative therapy as well, as pt is initiating more verbal output compared to this SLP's last visit. When asked questions in more casual conversation she responds primarily at the word level, but she responds more consistently than she does when asked to participate in more structured cognitive-linguistic tasks. She follows commands and attends best during familiar and functional tasks. Will continue to follow.   HPI HPI: Pt presented to the ED at [facility] on 12/13 with progressive altered mental status and lethargy.  MRI was performed reveling and acute left PCA territory infarct, possibly embolic, including punctate areas of restricted diffusion in bilateral cerebral and right cerebellar hemisphere as well as medial right thalamus, encephalomalacia in medial right occipital lobe, right posterior hippocampus and left frontoparietal region.  She was then transferred here for further evaluation.  EEG revealed no seizure activity.  PEG 12/21.      SLP Plan  Continue with current plan of care      Recommendations for follow up therapy are one component of a multi-disciplinary discharge  planning process, led by the attending physician.  Recommendations may be updated based on patient status, additional functional criteria and insurance authorization.    Recommendations  Diet recommendations: Dysphagia 2 (fine chop);Thin liquid Liquids provided via: Cup;Teaspoon;Straw Medication Administration: Crushed with puree Supervision: Staff to assist with self feeding;Full supervision/cueing for compensatory strategies Compensations: Slow rate;Small sips/bites Postural Changes and/or Swallow Maneuvers: Seated upright 90 degrees                Oral Care Recommendations: Oral care BID Follow Up Recommendations: Skilled nursing-short term rehab (<3 hours/day) Assistance recommended at discharge: Frequent or constant Supervision/Assistance SLP Visit Diagnosis: Cognitive communication deficit (R41.841);Dysphagia, unspecified (R13.10) Plan: Continue with current plan of care           Osie Bond., M.A. [facility] Pager [phone] Office [phone]  08/13/2021, 4:23 PM

## 2021-08-13 NOTE — Progress Notes (Signed)
[facility] Progress Note  Patient: Dorothy Alvarez    XNT:[id number]  DOA: 07/30/2021    Date of Service: the patient was seen and examined on 08/13/2021  Brief hospital course: 55 year old female with past medical history of morbid obesity, bipolar disorder, hypertension presented to the emergency room on 12/13 with increased somnolence and lethargy.  Found to have septic shock and CT scan noted emphysematous cystitis.  Work-up also revealed 4.7 cm rounded soft tissue structure between gastric fundus and left upper pole of kidney in the abdomen.  MRI of brain noted diffusion defect concerning for acute left PCA infarct.  Patient mated to hospitalist service.  Septic shock treated with aggressive IV fluids and antibiotics.  Completed antibiotics on 12/22.  Further work-up by neurology for CVA revealed bilateral multifocal stroke with infarcts in the left PCA, right cerebellum, bilateral frontal and right thalamus.  Patient found to have some dysphagia.  PEG tube placed on 12/22.  Started on aggressive Lipitor.  Initially put on aspirin, but once PEG tube placed, switched over to Eliquis given multi embolic nature of stroke.  Patient continues to remain with some confusion.  Started to follow some physical therapy.  Recommendations for skilled nursing but family refuses and would like to take her home.    Assessment and Plan: Cardiovascular and Mediastinum Essential hypertension Assessment & Plan As above.  Allowing for some permissive hypertension.  On home medications.  Blood pressure still staying elevated.  Checking BNP  Acute on chronic combined systolic and diastolic CHF (congestive heart failure) (HCC) Assessment & Plan Ejection fraction noted to be less than 20% on echocardiogram.  No evidence of left ventricular thrombus.  Weaned off of Cleviprex in ICU.  Entresto, Imdur, clonidine and Coreg continued.  Checking BNP.  To date, has diuresed over 12 L  Acute ischemic stroke  [facility]) Assessment & Plan Multifocal infarcts including left PCA, right cerebellum and bilateral frontal and right thalamus.  MRI noted no large vessel occlusion, but did note 50% stenosis of left PCA.  Carotid duplex noted nonsignificant stenosis bilaterally.  Echo noted no left ventricular thrombus.  A1c at 6.4, LDL at 141.  Currently patient unable to follow many commands, has NG tube for feeding.  Started on aspirin 325, then changed to Eliquis following PEG tube placement.  On Lipitor 80 mg  Digestive Dysphagia following cerebral infarction Assessment & Plan Secondary to CVA.  PEG tube placed 12/21, currently on tube feeds.  Nervous and Auditory * Acute encephalopathy Assessment & Plan Multifactorial from CVA plus polypharmacy at home plus AKI plus sepsis.  Patient starting to follow some commands and participate with therapy.  Musculoskeletal and Integument Rhabdomyolysis Assessment & Plan CPK of 8000 on admission, likely related to prolonged immobility.  Also had AKI.  Improved with hydration.  Genitourinary AKI (acute kidney injury) [facility]) Assessment & Plan Secondary to rhabdomyolysis.  Creatinine at 1.73 on admission, resolved with fluids.  Other MDD (major depressive disorder) Assessment & Plan Not on any home medications.  Hyperglycemia Assessment & Plan A1c of 6.4.  With tube feeds and medical issues, having hypoglycemic episodes.  Sliding scale insulin although at times patient's husband resists this  Morbid obesity ([place]) Assessment & Plan Patient meets criteria BMI greater than 35+ comorbidities of diabetes, CVA, hypertension  Bipolar II disorder ([place]) Assessment & Plan Normally on Seroquel.  Held currently due to alertness status.  May need to restart later on.  Mass of soft tissue of abdomen Assessment & Plan Incidental finding of  4.7 cm soft tissue mass on renal study CT.  As mentation and prognosis improved, will get CT of abdomen pelvis with IV and oral  contrast as outpatient.  Leukocytosis Assessment & Plan White blood cell count which was normal in 12/22 increased to 14.1 yesterday and today at 15.2.  Patient remains afebrile.  Not on steroids.  Rechecking urinalysis (especially in light of continued Foley catheter).  Chest x-ray done which was unremarkable.  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  Urine culture grew out 10,000 colonies of Enterococcus.  Patient met criteria for septic shock on admission given leukocytosis, fever, tachycardia and respiratory rate plus lactic acid level of 4.5 on admission.  Treated aggressively with fluids and antibiotics.  Completed 7-day course of IV ampicillin by 12/22.  Still with Foley catheter though, unable to remove catheter given mobility and poor mentation.    Body mass index is 34.84 kg/m.  Nutrition Problem: Inadequate oral intake Etiology: acute illness     Consultants: Neurology Interventional radiology Palliative care  Procedures: Echocardiogram: Ejection fraction 81%, diastolic dysfunction  Antimicrobials: IV ampicillin x7 days, course completed  Code Status: Full code   Subjective: Patient with no complaints.  Objective: Vital signs were reviewed and unremarkable. Vitals:   08/13/21 0803 08/13/21 1134  BP: (!) 161/102 (!) 131/95  Pulse: 87 86  Resp: 16 16  Temp: 98.1 F (36.7 C) 98.3 F (36.8 C)  SpO2: 100% 100%    Intake/Output Summary (Last 24 hours) at 08/13/2021 1557 Last data filed at 08/12/2021 2300 Gross per 24 hour  Intake --  Output 950 ml  Net -950 ml    Filed Weights   08/10/21 0500 08/11/21 0419 08/13/21 0157  Weight: 99.2 kg 98.2 kg 97.9 kg   Body mass index is 34.84 kg/m.  Exam:  General: Resting comfortably HEENT: Normocephalic, atraumatic, mucous membranes slightly dry.  Right eye still slightly irritated and red although improved. Cardiovascular: Regular rate and rhythm,  S1-S2 2 out of 6 systolic ejection murmur Respiratory: Clear to auscultation bilaterally Abdomen: Soft, nondistended, hypoactive bowel sounds, PEG tube in place Musculoskeletal: No clubbing or cyanosis or edema Skin: No skin breaks, tears or lesions Psychiatry: Given her nonverbal status and mentation issues, difficult to assess, does not appear to be in acute psychosis Neurology: Moves extremities, not able to follow commands consistently  Data Reviewed: My review of labs, imaging, notes and other tests shows no new significant findings.   Disposition:  Status is: Inpatient  Remains inpatient appropriate because: Needs to fully diuresed.  Anticipate discharge tomorrow, 12/28.   Family Communication: Daughter at the bedside.  DVT Prophylaxis: Place and maintain sequential compression device Start: 08/01/21 1458 apixaban (ELIQUIS) tablet 5 mg    Author: Annita Brod ,MD 08/13/2021 3:57 PM  To reach On-call, see care teams to locate the attending and reach out via [email or web address]. Between 7PM-7AM, please contact night-coverage If you still have difficulty reaching the attending provider, please page the [facility] (Director on Call) for [facility] on amion for assistance.

## 2021-08-14 DIAGNOSIS — I5043 Acute on chronic combined systolic (congestive) and diastolic (congestive) heart failure: Secondary | ICD-10-CM | POA: Diagnosis not present

## 2021-08-14 DIAGNOSIS — I639 Cerebral infarction, unspecified: Secondary | ICD-10-CM | POA: Diagnosis not present

## 2021-08-14 DIAGNOSIS — G9340 Encephalopathy, unspecified: Secondary | ICD-10-CM | POA: Diagnosis not present

## 2021-08-14 DIAGNOSIS — N179 Acute kidney failure, unspecified: Secondary | ICD-10-CM | POA: Diagnosis not present

## 2021-08-14 LAB — GLUCOSE, CAPILLARY
Glucose-Capillary: 130 mg/dL — ABNORMAL HIGH (ref 70–99)
Glucose-Capillary: 283 mg/dL — ABNORMAL HIGH (ref 70–99)

## 2021-08-14 LAB — CBC
HCT: 41 % (ref 36.0–46.0)
Hemoglobin: 14.6 g/dL (ref 12.0–15.0)
MCH: 32.5 pg (ref 26.0–34.0)
MCHC: 35.6 g/dL (ref 30.0–36.0)
MCV: 91.3 fL (ref 80.0–100.0)
Platelets: 468 10*3/uL — ABNORMAL HIGH (ref 150–400)
RBC: 4.49 MIL/uL (ref 3.87–5.11)
RDW: 12.9 % (ref 11.5–15.5)
WBC: 13.4 10*3/uL — ABNORMAL HIGH (ref 4.0–10.5)
nRBC: 0 % (ref 0.0–0.2)

## 2021-08-14 LAB — BASIC METABOLIC PANEL WITH GFR
Anion gap: 12 (ref 5–15)
BUN: 20 mg/dL (ref 6–20)
CO2: 23 mmol/L (ref 22–32)
Calcium: 9 mg/dL (ref 8.9–10.3)
Chloride: 99 mmol/L (ref 98–111)
Creatinine, Ser: 0.95 mg/dL (ref 0.44–1.00)
GFR, Estimated: >60 mL/min
Glucose, Bld: 259 mg/dL — ABNORMAL HIGH (ref 70–99)
Potassium: 4.5 mmol/L (ref 3.5–5.1)
Sodium: 134 mmol/L — ABNORMAL LOW (ref 135–145)

## 2021-08-14 MED ORDER — ACETAMINOPHEN 325 MG PO TABS: 650.0000 mg | ORAL_TABLET | ORAL | Status: DC | PRN

## 2021-08-14 MED ORDER — SACUBITRIL-VALSARTAN 24-26 MG PO TABS: 1.0000 | ORAL_TABLET | Freq: Two times a day (BID) | ORAL | 1 refills | Status: DC

## 2021-08-14 MED ORDER — ISOSORBIDE DINITRATE 10 MG PO TABS: 10.0000 mg | ORAL_TABLET | Freq: Three times a day (TID) | ORAL | 1 refills | Status: DC

## 2021-08-14 MED ORDER — APIXABAN 5 MG PO TABS: 5.0000 mg | ORAL_TABLET | Freq: Two times a day (BID) | ORAL | 1 refills | Status: DC

## 2021-08-14 MED ORDER — ATORVASTATIN CALCIUM 80 MG PO TABS
80.0000 mg | ORAL_TABLET | Freq: Every day | ORAL | 1 refills | Status: DC
Start: 2021-08-15 — End: 2022-01-08

## 2021-08-14 MED ORDER — CARVEDILOL 6.25 MG PO TABS
6.2500 mg | ORAL_TABLET | Freq: Two times a day (BID) | ORAL | 1 refills | Status: DC
Start: 2021-08-14 — End: 2022-01-08

## 2021-08-14 MED ORDER — METHYLPHENIDATE HCL 5 MG PO TABS: 5.0000 mg | ORAL_TABLET | Freq: Two times a day (BID) | ORAL | 0 refills | Status: DC

## 2021-08-14 MED ORDER — OSMOLITE 1.5 CAL PO LIQD: 355.0000 mL | Freq: Four times a day (QID) | ORAL | 5 refills | Status: DC

## 2021-08-14 MED ORDER — CLONIDINE HCL 0.1 MG PO TABS: 0.1000 mg | ORAL_TABLET | Freq: Two times a day (BID) | ORAL | 11 refills | Status: DC

## 2021-08-14 MED ORDER — POTASSIUM CHLORIDE 20 MEQ PO PACK: 20.0000 meq | PACK | Freq: Two times a day (BID) | ORAL | 2 refills | Status: DC

## 2021-08-14 NOTE — Progress Notes (Signed)
Patient discharge instructions discussed with daughter and husband at bedside. All questions answered. Iv removed and foley catheter removed. Patient belongings taken by daughter. [facility] arrived to transport patient home. Gwendolyn Grant, RN

## 2021-08-14 NOTE — Discharge Summary (Addendum)
Physician Discharge Summary   Patient: Dorothy Alvarez MRN: [id number] DOB: _[birth year]   Admit date:     07/30/2021  Discharge date: 08/14/21  Discharge Physician: Annita Brod   PCP: Nolene Ebbs, MD   Recommendations at discharge: 1.  The following medications have been stopped due to oversedation: Seroquel, Zanaflex, Oxy IR and Neurontin 2. medication change: Lotrel discontinued 3.  New medication: Eliquis 5 p.o. twice daily 4. new medication: Coreg 6.25 p.o. twice daily and Imdur 10 mg p.o. 3 times a day 5. new medication: Atorvastatin 80 mg p.o. daily and Entresto 24/26 1 tab p.o. twice daily 6. new medication: Ritalin 5 mg p.o. twice daily 7.  All medications given through feeding tube. 8.  Patient needs noncontrast abdominal CT for follow-up for 4.7 cm soft tissue mass seen on renal CT on admission.  Hospital Course   55 year old female with past medical history of morbid obesity, bipolar disorder, hypertension presented to the emergency room on 12/13 with increased somnolence and lethargy.  Found to have septic shock and CT scan noted emphysematous cystitis.  Work-up also revealed 4.7 cm rounded soft tissue structure between gastric fundus and left upper pole of kidney in the abdomen.  MRI of brain noted diffusion defect concerning for acute left PCA infarct.  Patient mated to hospitalist service.  Septic shock treated with aggressive IV fluids and antibiotics.  Completed antibiotics on 12/22.  Further work-up by neurology for CVA revealed bilateral multifocal stroke with infarcts in the left PCA, right cerebellum, bilateral frontal and right thalamus.  Patient found to have some dysphagia.  PEG tube placed on 12/22 and started on tube feeds.  Started on aggressive Lipitor.  Initially put on aspirin, but once PEG tube placed, switched over to Eliquis given multi embolic nature of stroke.  Patient continues to remain with some confusion, although improving.  PT recommended skilled  nursing, but family wants to take home.  Noted to have some persistently elevated blood pressures and found to have an elevated BNP of 600 on 12/25.  Started on IV Lasix and diuresed several liters over the next few days.  By 12/28, felt to medically stable to discharge home with home health  Discharge Diagnoses Cardiovascular and Mediastinum Essential hypertension Assessment & Plan As above.  Allowing for some permissive hypertension.  On home medications.  Stay elevated due to volume overload.  Improving with IV Lasix.  Acute on chronic combined systolic and diastolic CHF (congestive heart failure) (HCC) Assessment & Plan Ejection fraction noted to be less than 20% on echocardiogram.  No evidence of left ventricular thrombus.  Weaned off of Cleviprex in ICU.  Entresto, Imdur, clonidine and Coreg continued.  BNP mildly elevated at 630.  Patient received IV Lasix and diuresed over 10 L.  She will continue on her home dose of Lasix 80 mg daily  Acute ischemic stroke [facility]) Assessment & Plan Multifocal infarcts including left PCA, right cerebellum and bilateral frontal and right thalamus.  MRI noted no large vessel occlusion, but did note 50% stenosis of left PCA.  Carotid duplex noted nonsignificant stenosis bilaterally.  Echo noted no left ventricular thrombus.  A1c at 6.4, LDL at 141.  Currently patient unable to follow many commands, has NG tube for feeding.  Started on aspirin 325, then changed to Eliquis following PEG tube placement.  On Lipitor 80 mg  Digestive Dysphagia following cerebral infarction Assessment & Plan Secondary to CVA.  PEG tube placed 12/21, currently on tube feeds.  Nervous and Auditory *  Acute encephalopathy Assessment & Plan Multifactorial from CVA plus polypharmacy at home plus AKI plus sepsis.  Patient continues to show improvements, starting to follow some commands and participate with therapy.  Starting to show signs of increasing alertness.  Continue Ritalin 5 mg  twice daily.  Musculoskeletal and Integument Rhabdomyolysis Assessment & Plan CPK of 8000 on admission, likely related to prolonged immobility.  Also had AKI.  Improved with hydration.  Genitourinary AKI (acute kidney injury) [facility]) Assessment & Plan Secondary to rhabdomyolysis.  Creatinine at 1.73 on admission, resolved with fluids.  Other MDD (major depressive disorder) Assessment & Plan Not on any home medications.  Hyperglycemia Assessment & Plan A1c of 6.4.  Patient technically prediabetic.  Some elevated blood sugars due to marital stress.  Treating with sliding scale.  Morbid obesity ([place]) Assessment & Plan Patient meets criteria BMI greater than 35+ comorbidities of diabetes, CVA, hypertension  Bipolar II disorder ([place]) Assessment & Plan Normally on Seroquel.  Held currently due to alertness status.  May need to restart later on.  Redness of right eye Assessment & Plan Look to be secondary to irritation, occurring on 12/26.  Patient given some saline eyedrops which did help the situation.    Mass of soft tissue of abdomen Assessment & Plan Incidental finding of 4.7 cm soft tissue mass on renal study CT.  As mentation and prognosis improved, will get CT of abdomen pelvis with IV and oral contrast as outpatient.  Leukocytosis Assessment & Plan White blood cell count which was normal in 12/22 slowly increasing and peaked at 15.2 on 12/25.  Suspect secondary to stress margination from volume overload.  White blood cell count improved significantly with diuresis and no antibiotics.  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  Urine culture grew out 10,000 colonies of Enterococcus.  Patient met criteria for septic shock on admission given leukocytosis, fever, tachycardia and respiratory rate plus lactic acid level of 4.5 on admission.  Treated aggressively with fluids and antibiotics.  Completed 7-day course of IV  ampicillin by 12/22.  Foley cath removed on day of discharge.   Consultants:  Neurology Interventional radiology Palliative care  Procedures performed:  Echocardiogram noting ejection fraction of 95% plus diastolic dysfunction  Disposition: Home with home health Diet recommendation: NPO with tube feedings, Osmolite 355 cc 4 times a day  DISCHARGE MEDICATION: Allergies as of 08/14/2021       Reactions   Ketorolac Other (See Comments)   Anxiety and confusion   Morphine And Related Other (See Comments)   Mental status changes (confusion and anxiety)   Nsaids Other (See Comments)   Unknown reaction per [facility]   Trazodone And Nefazodone    Restless and opposite effect.         Medication List     STOP taking these medications    amLODipine-benazepril 10-20 MG capsule Commonly known as: LOTREL   gabapentin 800 MG tablet Commonly known as: NEURONTIN   oxyCODONE 15 MG immediate release tablet Commonly known as: ROXICODONE   QUEtiapine 300 MG tablet Commonly known as: SEROQUEL   tiZANidine 4 MG tablet Commonly known as: ZANAFLEX       TAKE these medications    acetaminophen 325 MG tablet Commonly known as: TYLENOL Place 2 tablets (650 mg total) into feeding tube every 4 (four) hours as needed for mild pain (or temp > 37.5 C (99.5 F)).   apixaban 5 MG Tabs tablet Commonly known as: ELIQUIS  Place 1 tablet (5 mg total) into feeding tube 2 (two) times daily.   atorvastatin 80 MG tablet Commonly known as: LIPITOR Place 1 tablet (80 mg total) into feeding tube daily. Start taking on: August 15, 2021   carvedilol 6.25 MG tablet Commonly known as: COREG Place 1 tablet (6.25 mg total) into feeding tube 2 (two) times daily with a meal.   cloNIDine 0.1 MG tablet Commonly known as: CATAPRES Place 1 tablet (0.1 mg total) into feeding tube 2 (two) times daily.   feeding supplement (OSMOLITE 1.5 CAL) Liqd Place 355 mLs into feeding tube 4 (four) times  daily.   furosemide 80 MG tablet Commonly known as: Lasix Take 1 tablet (80 mg total) by mouth daily.   isosorbide dinitrate 10 MG tablet Commonly known as: ISORDIL Place 1 tablet (10 mg total) into feeding tube 3 (three) times daily.   methylphenidate 5 MG tablet Commonly known as: RITALIN Place 1 tablet (5 mg total) into feeding tube 2 (two) times daily with breakfast and lunch.   potassium chloride 20 MEQ packet Commonly known as: KLOR-CON Place 20 mEq into feeding tube 2 (two) times daily.   sacubitril-valsartan 24-26 MG Commonly known as: ENTRESTO Take 1 tablet by mouth 2 (two) times daily.               Durable Medical Equipment  (From admission, onward)           Start     Ordered   08/09/21 1328  For home use only DME Tube feeding  Once       Comments: Transition to bolus TF regimen via G-tube: -Jevity 1.5. Orders as follows: Provide 352m (1.5 cartons) Jevity 1.5 QID, flush with 553mfree water before and after each bolus (Provides 2130 kcals, 90 grams protein, 108033mree water -- 1480m39mth flushes)   08/09/21 1327   08/07/21 1609  For home use only DME lightweight manual wheelchair with seat cushion  Once       Comments: Patient suffers from stroke which impairs their ability to perform daily activities like bathing, dressing, grooming, and toileting in the home.  A walker will not resolve  issue with performing activities of daily living. A wheelchair will allow patient to safely perform daily activities. Patient is not able to propel themselves in the home using a standard weight wheelchair due to general weakness. Patient can self propel in the lightweight wheelchair. Length of need lifetime. Accessories: elevating leg rests (ELRs), wheel locks, extensions and anti-tippers.   08/07/21 1608   08/07/21 1608  For home use only DME Hospital bed  Once       Question Answer Comment  Length of Need 6 Months   The above medical condition requires: Patient  requires the ability to reposition frequently   Head must be elevated greater than: 30 degrees   Bed type Semi-electric   Hoyer Lift Yes   Support Surface: Gel Overlay      08/07/21 1608   08/07/21 1608  For home use only DME 3 n 1  Once        08/07/21 1608            Follow-up Information     [place].   Specialty: Home Health Services Why: For home health services. They will call to schedule a time to come out to the house Contact information: [id number] [phone]  Discharge Exam: Filed Weights   08/10/21 0500 08/11/21 0419 08/13/21 0157  Weight: 99.2 kg 98.2 kg 97.9 kg   General: Intermittently awake, no acute distress Cardiovascular: Irregular rhythm, rate controlled Lungs: Clear to auscultation bilaterally  Condition at discharge: stable  The results of significant diagnostics from this hospitalization (including imaging, microbiology, ancillary and laboratory) are listed below for reference.   Imaging Studies: DG Chest 2 View  Result Date: 07/30/2021 CLINICAL DATA:  Altered level of con gaseousness, sleepiness, lethargy, tachycardia, intermittent nausea and vomiting for 2 weeks, history hypertension EXAM: CHEST - 2 VIEW COMPARISON:  05/01/2020 FINDINGS: Enlargement of cardiac silhouette with pulmonary vascular congestion. Mediastinal contours normal. Lungs clear. No acute infiltrate, pleural effusion, or pneumothorax. No focal osseous abnormalities. IMPRESSION: Enlargement of cardiac silhouette with pulmonary vascular congestion. No acute abnormalities. Electronically Signed   By: Lavonia Dana M.D.   On: 07/30/2021 17:59   CT HEAD WO CONTRAST (5MM)  Result Date: 08/06/2021 CLINICAL DATA:  Follow-up examination for stroke. EXAM: CT HEAD WITHOUT CONTRAST TECHNIQUE: Contiguous axial images were obtained from the base of the skull through the vertex without intravenous  contrast. COMPARISON:  Prior MRI from 07/31/2021 FINDINGS: Brain: Chronic right PCA and posterior left MCA distribution infarcts again seen, stable. Continued interval evolution of previously identified acute left PCA distribution infarct, grossly stable in size and morphology as compared to prior MRI. Additional previously identified scattered smaller infarcts not visible by CT. No hemorrhagic transformation, mass effect, or other complication. No other new acute large vessel territory infarct. No interval hemorrhage. No mass lesion, midline shift, or significant mass effect. No hydrocephalus or extra-axial fluid collection. Vascular: No hyperdense vessel. Skull: Scalp soft tissues and calvarium demonstrate no new abnormality. Sinuses/Orbits: Globes and orbital soft tissues demonstrate no acute finding. Paranasal sinuses and mastoid air cells remain clear. Nasogastric tube noted. Other: None. IMPRESSION: 1. Continued interval evolution of previously identified acute left PCA distribution infarct, grossly similar in size and morphology as compared to prior MRI. Additional smaller ischemic infarcts not visible by CT. No hemorrhagic transformation, mass effect, or other complication. 2. No other new acute intracranial abnormality. Electronically Signed   By: Jeannine Boga M.D.   On: 08/06/2021 01:47   CT HEAD WO CONTRAST (5MM)  Result Date: 07/30/2021 CLINICAL DATA:  Lethargy. EXAM: CT HEAD WITHOUT CONTRAST CT CERVICAL SPINE WITHOUT CONTRAST TECHNIQUE: Multidetector CT imaging of the head and cervical spine was performed following the standard protocol without intravenous contrast. Multiplanar CT image reconstructions of the cervical spine were also generated. COMPARISON:  None. FINDINGS: CT HEAD FINDINGS Brain: Subacute to chronic right occipital lobe infarct. No evidence for acute hemorrhage, hydrocephalus, or abnormal extra-axial fluid collection. Patchy low attenuation in the deep hemispheric and  periventricular white matter is nonspecific, but likely reflects chronic microvascular ischemic demyelination. Vascular: No hyperdense vessel or unexpected calcification. Skull: No evidence for fracture. No worrisome lytic or sclerotic lesion. Sinuses/Orbits: The visualized paranasal sinuses and mastoid air cells are clear. Visualized portions of the globes and intraorbital fat are unremarkable. Other: None. CT CERVICAL SPINE FINDINGS Alignment: Straightening of normal cervical lordosis. Skull base and vertebrae: Lower cervical spine partially obscured by motion artifact. Within this limitation, there is no evidence for acute fracture. Soft tissues and spinal canal: No prevertebral fluid or swelling. No visible canal hematoma. Disc levels:  Loss of disc height noted C5-6, C6-7, and C7-T1. Upper chest: Unremarkable. Other: None. IMPRESSION: 1. Probable chronic right occipital lobe infarct although subacute timing is a possibility.  Otherwise, no acute intracranial findings. 2. Degenerative changes in the cervical spine without evidence for an acute fracture. 3. Loss of cervical lordosis. This can be related to patient positioning, muscle spasm or soft tissue injury. Electronically Signed   By: Misty Stanley M.D.   On: 07/30/2021 18:57   CT Cervical Spine Wo Contrast  Result Date: 07/30/2021 CLINICAL DATA:  Lethargy. EXAM: CT HEAD WITHOUT CONTRAST CT CERVICAL SPINE WITHOUT CONTRAST TECHNIQUE: Multidetector CT imaging of the head and cervical spine was performed following the standard protocol without intravenous contrast. Multiplanar CT image reconstructions of the cervical spine were also generated. COMPARISON:  None. FINDINGS: CT HEAD FINDINGS Brain: Subacute to chronic right occipital lobe infarct. No evidence for acute hemorrhage, hydrocephalus, or abnormal extra-axial fluid collection. Patchy low attenuation in the deep hemispheric and periventricular white matter is nonspecific, but likely reflects chronic  microvascular ischemic demyelination. Vascular: No hyperdense vessel or unexpected calcification. Skull: No evidence for fracture. No worrisome lytic or sclerotic lesion. Sinuses/Orbits: The visualized paranasal sinuses and mastoid air cells are clear. Visualized portions of the globes and intraorbital fat are unremarkable. Other: None. CT CERVICAL SPINE FINDINGS Alignment: Straightening of normal cervical lordosis. Skull base and vertebrae: Lower cervical spine partially obscured by motion artifact. Within this limitation, there is no evidence for acute fracture. Soft tissues and spinal canal: No prevertebral fluid or swelling. No visible canal hematoma. Disc levels:  Loss of disc height noted C5-6, C6-7, and C7-T1. Upper chest: Unremarkable. Other: None. IMPRESSION: 1. Probable chronic right occipital lobe infarct although subacute timing is a possibility. Otherwise, no acute intracranial findings. 2. Degenerative changes in the cervical spine without evidence for an acute fracture. 3. Loss of cervical lordosis. This can be related to patient positioning, muscle spasm or soft tissue injury. Electronically Signed   By: Misty Stanley M.D.   On: 07/30/2021 18:57   MR ANGIO HEAD WO CONTRAST  Result Date: 08/01/2021 CLINICAL DATA:  Follow-up stroke. Left PCA territory insult. Scattered other embolic distribution infarctions. Old right PCA territory stroke. EXAM: MRA HEAD WITHOUT CONTRAST TECHNIQUE: Angiographic images of the Circle of Willis were acquired using MRA technique without intravenous contrast. COMPARISON:  MRI yesterday. FINDINGS: Anterior circulation: Both internal carotid arteries are widely patent through the skull base and siphon regions. The anterior and middle cerebral vessels are patent. No large vessel occlusion or correctable proximal stenosis. More distal branch vessels show mild atherosclerotic irregularity. Posterior circulation: Both vertebral arteries are widely patent to the basilar. No  basilar stenosis. Left PICA shows flow. Large right anterior inferior cerebellar artery shows flow. Flow is present in both superior cerebellar arteries and both posterior cerebral arteries. Posterior cerebral arteries show considerable atherosclerotic irregularity. On the right, the vessel is diminutive consistent with the old infarction. On the left, there is 50% stenosis at the proximal PCA with distal vessel irregular changes. Anatomic variants: None significant. Other: None. IMPRESSION: No acute large vessel occlusion by MRA. Anterior circulation shows mild distal vessel atherosclerotic irregularity. Posterior circulation shows more extensive atherosclerotic disease in the posterior cerebral artery territories. The right PCA is diminutive and irregular consistent with previous infarction in that territory. The left PCA shows a 50% stenosis just beyond its origin, with moderate irregularity distal to that, but with patency presently. Electronically Signed   By: Nelson Chimes M.D.   On: 08/01/2021 07:55   MR BRAIN WO CONTRAST  Result Date: 07/31/2021 CLINICAL DATA:  Lethargy, mental status change EXAM: MRI HEAD WITHOUT CONTRAST TECHNIQUE: Multiplanar, multiecho  pulse sequences of the brain and surrounding structures were obtained without intravenous contrast. COMPARISON:  MRI 12/19/2003, correlation is made with 07/30/2021 CT head. FINDINGS: Brain: Restricted diffusion with ADC correlate involving the medial left occipital lobe and posterior left hippocampus (series 9, image 66), with additional smaller areas of restricted diffusion in the medial right thalamus (series 9, image 72), left frontal lobe (series 9, images 77, 79, 88, 89), right frontal lobe (series 9, image 82, 87, 89), right parietal lobe (series 9, image 80), and right cerebellum (series 9, image 59). These are concerning for acute infarcts, both in a left PCA territory and in an embolic pattern. Encephalomalacia and increased T2 signal in the  medial right occipital lobe and posterior hippocampus, as well as left frontoparietal lobes, likely sequela of remote infarcts. No acute hemorrhage, mass, mass effect, or midline shift. No extra-axial collection or hydrocephalus. Vascular: Normal flow voids. Skull and upper cervical spine: Normal marrow signal. Sinuses/Orbits: Negative. Other: Trace fluid in bilateral mastoid air cells. IMPRESSION: 1. Restricted diffusion in the medial left occipital lobe and posterior left hippocampus, most likely an acute left PCA territory infarct. 2. Additional more punctate areas of restricted diffusion in the bilateral cerebral and right cerebellar hemisphere, as well as the medial right thalamus, concerning for additional embolic etiology. 3. Encephalomalacia in the medial right occipital lobe, right posterior hippocampus, and left frontoparietal region, likely sequela of remote infarcts. These results were called by telephone at the time of interpretation on 07/31/2021 at 12:58 am to provider MOLPUS, who verbally acknowledged these results. Electronically Signed   By: Merilyn Baba M.D.   On: 07/31/2021 00:58   IR GASTROSTOMY TUBE MOD SED  Result Date: 08/08/2021 INDICATION: 55 year old female with history of stroke and dysphagia. EXAM: PERC PLACEMENT GASTROSTOMY MEDICATIONS: Ancef 2 gm IV; Antibiotics were administered within 1 hour of the procedure. ANESTHESIA/SEDATION: Versed 0.5 mg IV; Fentanyl 0 mcg IV Moderate Sedation Time:  0 The patient was continuously monitored during the procedure by the interventional radiology nurse under my direct supervision. CONTRAST:  20 mL Omnipaque 300-administered into the gastric lumen. FLUOROSCOPY TIME:  Fluoroscopy Time: 0 minutes 36 seconds (4 mGy). COMPLICATIONS: None immediate. PROCEDURE: Informed written consent was obtained from the patient after a thorough discussion of the procedural risks, benefits and alternatives. All questions were addressed. Maximal Sterile barrier  Technique was utilized including caps, mask, sterile gowns, sterile gloves, sterile drape, hand hygiene and skin antiseptic. A timeout was performed prior to the initiation of the procedure. The patient was placed on the procedure table in the supine position. Pre-procedure abdominal film confirmed visualization of the transverse colon. The patient was prepped and draped in usual sterile fashion. The stomach was insufflated with air via the indwelling nasogastric tube. Under fluoroscopy, a puncture site was selected and local analgesia achieved with 1% lidocaine infiltrated subcutaneously. Under fluoroscopic guidance, a gastropexy needle was passed into the stomach and the T-bar suture was released. Entry into the stomach was confirmed with fluoroscopy, aspiration of air, and injection of contrast material. This was repeated with an additional gastropexy suture (for a total of 2 fasteners). At the center of these gastropexy sutures, a dermatotomy was performed. An 18 gauge needle was passed into the stomach at the site of this dermatotomy, and position within the gastric lumen again confirmed under fluoroscopy using aspiration of air and contrast injection. An Amplatz guidewire was passed through this needle and intraluminal placement within the stomach was confirmed by fluoroscopy. The needle was removed.  Over the guidewire, the percutaneous tract was dilated using a 10 mm non-compliant balloon. The balloon was deflated, then pushed into the gastric lumen followed in concert by the 22 [place] gastrostomy tube. The retention balloon of the percutaneous gastrostomy tube was inflated with 20 mL of sterile water. The tube was withdrawn until the retention balloon was at the edge of the gastric lumen. The external bumper was brought to the abdominal wall. Contrast was injected through the gastrostomy tube, confirming intraluminal positioning. The patient tolerated the procedure well without any immediate post-procedural  complications. IMPRESSION: Technically successful placement of 22 Fr gastrostomy tube. Ruthann Cancer, MD [facility] Radiology Electronically Signed   By: Ruthann Cancer M.D.   On: 08/08/2021 07:39   DG CHEST PORT 1 VIEW  Result Date: 08/11/2021 CLINICAL DATA:  Pneumonia EXAM: PORTABLE CHEST 1 VIEW COMPARISON:  07/30/2021 FINDINGS: Portable semi upright view. Stable cardiomegaly without definite focal pneumonia, significant collapse or consolidation. No large effusion or pneumothorax. Trachea midline. Minor basilar atelectasis. IMPRESSION: Stable cardiomegaly without acute process by plain radiography. Electronically Signed   By: Jerilynn Mages.  Shick M.D.   On: 08/11/2021 12:18   DG Abd Portable 1V  Result Date: 08/02/2021 CLINICAL DATA:  Feeding tube placement EXAM: PORTABLE ABDOMEN - 1 VIEW COMPARISON:  None. FINDINGS: Feeding tube with weighted tip in the gastric antrum/pyloric region. Stylet removed. Normal bowel-gas pattern. IMPRESSION: Feeding tube with tip in the gastric antral/pyloric region. Electronically Signed   By: Suzy Bouchard M.D.   On: 08/02/2021 15:14   EEG adult  Result Date: 08/05/2021 Lora Havens, MD     08/05/2021  4:31 PM Patient Name: Amiree No MRN: [id number] Epilepsy Attending: Lora Havens Referring Physician/Provider: Dr Antony Contras Date: 08/05/2021 Duration: 32.48 mins Patient history: 55yo M with ams. EEG to evaluate for seizure. Level of alertness: Awake AEDs during EEG study: None Technical aspects: This EEG study was done with scalp electrodes positioned according to the 10-20 International system of electrode placement. Electrical activity was acquired at a sampling rate of _0  and reviewed with a high frequency filter of _1  and a low frequency filter of _2 . EEG data were recorded continuously and digitally stored. Description: EEG showed continuous generalized 3 to 6 Hz theta-delta slowing. Hyperventilation and  photic stimulation were not performed.   ABNORMALITY - Continuous slow, generalized IMPRESSION: This study is suggestive of moderate diffuse encephalopathy, nonspecific etiology. No seizures or epileptiform discharges were seen throughout the recording. Lora Havens   EEG adult  Result Date: 08/01/2021 Lora Havens, MD     08/01/2021  8:22 AM Patient Name: Jalayiah Bibian MRN: [id number] Epilepsy Attending: Lora Havens Referring Physician/Provider: Kathie Dike, MD Date: 07/31/2021 Duration: 23.07 mins Patient history: 55 year old female with altered mental status.  EEG to evaluate for seizure. Level of alertness: Awake AEDs during EEG study: None Technical aspects: This EEG study was done with scalp electrodes positioned according to the 10-20 International system of electrode placement. Electrical activity was acquired at a sampling rate of _3  and reviewed with a high frequency filter of _4  and a low frequency filter of _5 . EEG data were recorded continuously and digitally stored. Description: EEG showed continuous generalized 3 to 6 Hz theta-delta slowing. Hyperventilation and photic stimulation were not performed.   ABNORMALITY - Continuous slow, generalized IMPRESSION: This study is suggestive of moderate diffuse encephalopathy, nonspecific etiology. No seizures or epileptiform discharges were seen throughout the recording. Priyanka O Yadav   VAS  [place] TRANSCRANIAL DOPPLER W BUBBLES  Result Date: 08/04/2021  Transcranial Doppler with Bubble Patient Name:  ALMA Biegler  Date of Exam:   08/03/2021 Medical Rec #: [id number]    Accession #:    [id number] Date of Birth: August 26, 1965    [name] Patient Age:   26 years Exam Location:  [facility] Procedure:      VAS [place] TRANSCRANIAL DOPPLER W BUBBLES Referring Phys: Terrilee Croak --------------------------------------------------------------------------------  Indications: Stroke. 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 Hypertension. Limitations: Difficult windows, altered mental status Comparison Study: No prior study Performing Technologist: Sharion Dove RVS  Examination Guidelines: A complete evaluation includes B-mode imaging, spectral Doppler, color Doppler, and power Doppler as needed of all accessible portions of each vessel. Bilateral testing is considered an integral part of a complete examination. Limited examinations for reoccurring indications may be performed as noted.  Summary: No HITS at rest or during Valsalva. Negative transcranial Doppler Bubble study with no evidence of right to left intracardiac communication.  *See table(s) above for TCD measurements and observations.  Diagnosing physician: Antony Contras MD Electronically signed by Antony Contras MD on 08/04/2021 at 1:05:49 PM.    Final    ECHOCARDIOGRAM COMPLETE  Result Date: 07/31/2021    ECHOCARDIOGRAM REPORT   Patient Name:   APPHIA Ransom Date of Exam: 07/31/2021 Medical Rec #:  [id number]   Height:       66.0 in Accession #:    [id number]  Weight:       224.9 lb Date of Birth:  1965/09/03   BSA:          2.102 m Patient Age:    53 years    BP:           168/130 mmHg [name]           HR:           102 bpm. Exam Location:  Inpatient Procedure: 2D Echo, Color Doppler, Cardiac Doppler and Intracardiac            Opacification Agent Indications:    Stroke i63.9  History:        Patient has prior history of Echocardiogram examinations, most                 recent 05/02/2020. CHF; Risk Factors:Hypertension.  Sonographer:    Raquel Sarna Senior RDCS Referring Phys: [place]  1. Left ventricular ejection fraction, by estimation, is at best 20%. The left ventricle has severely decreased function. The left ventricle demonstrates global hypokinesis. There is mild left ventricular hypertrophy. Left ventricular diastolic parameters are indeterminate. There is LV trabeculation without thrombus.  2. Right ventricular systolic function  is moderately reduced. The right ventricular size is normal. There is severely elevated pulmonary artery systolic pressure. The estimated right ventricular systolic pressure is 00.3 mmHg.  3. Left atrial size was mildly dilated.  4. The mitral valve is normal in structure. Mild mitral valve regurgitation. No evidence of mitral stenosis.  5. Tricuspid valve regurgitation is moderate to severe.  6. The aortic valve is tricuspid. There is mild thickening of the aortic valve. Aortic valve regurgitation is not visualized. Aortic valve sclerosis is present, with no evidence of aortic valve stenosis.  7. The inferior vena cava is dilated in size with <50% respiratory variability, suggesting right atrial pressure of 15 mmHg. Comparison(s): A prior study was performed on 05/02/2020. Further decrease in LV function. FINDINGS  Left Ventricle: Left  ventricular ejection fraction, by estimation, is 20%. The left ventricle has severely decreased function. The left ventricle demonstrates global hypokinesis. Definity contrast agent was given IV to delineate the left ventricular endocardial borders. The left ventricular internal cavity size was normal in size. There is mild left ventricular hypertrophy. Left ventricular diastolic parameters are indeterminate. Right Ventricle: The right ventricular size is normal. No increase in right ventricular wall thickness. Right ventricular systolic function is moderately reduced. There is severely elevated pulmonary artery systolic pressure. The tricuspid regurgitant velocity is 3.56 m/s, and with an assumed right atrial pressure of 15 mmHg, the estimated right ventricular systolic pressure is 27.2 mmHg. Left Atrium: Left atrial size was mildly dilated. Right Atrium: Right atrial size was normal in size. Pericardium: Trivial pericardial effusion is present. Mitral Valve: The mitral valve is normal in structure. Mild mitral valve regurgitation. No evidence of mitral valve stenosis. Tricuspid  Valve: The tricuspid valve is normal in structure. Tricuspid valve regurgitation is moderate to severe. Aortic Valve: The aortic valve is tricuspid. There is mild thickening of the aortic valve. Aortic valve regurgitation is not visualized. Aortic valve sclerosis is present, with no evidence of aortic valve stenosis. Pulmonic Valve: The pulmonic valve was not well visualized. Pulmonic valve regurgitation is not visualized. No evidence of pulmonic stenosis. Aorta: The aortic root and ascending aorta are structurally normal, with no evidence of dilitation. Venous: The inferior vena cava is dilated in size with less than 50% respiratory variability, suggesting right atrial pressure of 15 mmHg. IAS/Shunts: The atrial septum is grossly normal.  LEFT VENTRICLE PLAX 2D LVIDd:         4.90 cm   Diastology LVIDs:         3.90 cm   LV e' medial:    2.93 cm/s LV PW:         1.20 cm   LV E/e' medial:  25.6 LV IVS:        1.20 cm   LV e' lateral:   3.08 cm/s LVOT diam:     2.00 cm   LV E/e' lateral: 24.4 LV SV:         28 LV SV Index:   13 LVOT Area:     3.14 cm  RIGHT VENTRICLE RV S prime:     7.05 cm/s TAPSE (M-mode): 1.4 cm LEFT ATRIUM             Index        RIGHT ATRIUM           Index LA diam:        3.70 cm 1.76 cm/m   RA Area:     20.10 cm LA Vol (A2C):   80.4 ml 38.25 ml/m  RA Volume:   59.00 ml  28.07 ml/m LA Vol (A4C):   75.4 ml 35.87 ml/m LA Biplane Vol: 79.4 ml 37.77 ml/m  AORTIC VALVE LVOT Vmax:   72.50 cm/s LVOT Vmean:  46.800 cm/s LVOT VTI:    0.090 m  AORTA Ao Root diam: 3.10 cm Ao Asc diam:  3.60 cm MITRAL VALVE               TRICUSPID VALVE MV Area (PHT): 6.96 cm    TR Peak grad:   50.7 mmHg MV Decel Time: 109 msec    TR Vmax:        356.00 cm/s MV E velocity: 75.00 cm/s MV A velocity: 46.70 cm/s  SHUNTS MV E/A ratio:  1.61  Systemic VTI:  0.09 m                            Systemic Diam: 2.00 cm Rudean Haskell MD Electronically signed by Rudean Haskell MD Signature Date/Time:  07/31/2021/1:08:43 PM    Final    CT Renal Stone Study  Result Date: 07/30/2021 CLINICAL DATA:  Intermittent nausea and vomiting for 2 weeks EXAM: CT ABDOMEN AND PELVIS WITHOUT CONTRAST TECHNIQUE: Multidetector CT imaging of the abdomen and pelvis was performed following the standard protocol without IV contrast. COMPARISON:  10/11/2012 FINDINGS: Lower chest: No acute pleural or parenchymal lung disease. Hepatobiliary: Unremarkable unenhanced appearance of the liver and gallbladder. Pancreas: Unremarkable unenhanced appearance. Spleen: Unremarkable unenhanced appearance. Adrenals/Urinary Tract: There is a 4.1 by 4.7 by 3.2 cm rounded structure interposed between the stomach and ventral aspect upper pole left kidney. Given location, this could reflect gastric diverticulum, pancreatic tail mass, or exophytic renal mass. Nonemergent follow-up abdominal CT with IV and oral contrast is recommended. No urinary tract calculi or obstructive uropathy within either kidney. The adrenals are unremarkable. Intramural gas is seen throughout the bladder consistent with emphysematous cystitis. Stomach/Bowel: No bowel obstruction or ileus. Normal appendix right lower quadrant. Sigmoid diverticulosis without diverticulitis. No bowel wall thickening or inflammatory change. Vascular/Lymphatic: Aortic atherosclerosis. No enlarged abdominal or pelvic lymph nodes. Reproductive: Status post hysterectomy. No adnexal masses. Other: No free fluid or free gas.  No abdominal wall hernia. Musculoskeletal: No acute or destructive bony lesions. Reconstructed images demonstrate no additional findings. IMPRESSION: 1. Emphysematous cystitis. 2. 4.7 cm rounded soft tissue structure interposed between the gastric fundus and the upper pole left kidney. Given location, gastric diverticulum, pancreatic tail mass, or exophytic renal mass could give this appearance. Nonemergent follow-up CT of the abdomen with IV and oral contrast is recommended. 3.  Sigmoid diverticulosis without diverticulitis. 4.  Aortic Atherosclerosis (ICD10-I70.0). Critical Value/emergent results were called by telephone at the time of interpretation on 07/30/2021 at 11:42 pm to provider DR Lorin Glass, who verbally acknowledged these results. Electronically Signed   By: Randa Ngo M.D.   On: 07/30/2021 23:42   VAS US CAROTID  Result Date: 08/01/2021 Carotid Arterial Duplex Study Patient Name:  UNNAMED Fronczak  Date of Exam:   08/01/2021 Medical Rec #: [id number]    Accession #:    [id number] Date of Birth: 10/12/1965    [name] Patient Age:   65 years Exam Location:  [facility] Procedure:      VAS US CAROTID Referring Phys: Anibal Henderson --------------------------------------------------------------------------------  Indications:       CVA and altered mental status. Other Factors:     Uncontrolled hypertension, left PCA territory ischemic CVA.                    History of advanced cardiomyopathy with systolic dysfunction. Comparison Study:  No prior Performing Technologist: Oda Cogan RDMS, RVT  Examination Guidelines: A complete evaluation includes B-mode imaging, spectral Doppler, color Doppler, and power Doppler as needed of all accessible portions of each vessel. Bilateral testing is considered an integral part of a complete examination. Limited examinations for reoccurring indications may be performed as noted.  Right Carotid Findings: +----------+--------+--------+--------+------------------+------------------+             PSV cm/s EDV cm/s Stenosis Plaque Description Comments            +----------+--------+--------+--------+------------------+------------------+  CCA Prox   59  10                                                       +----------+--------+--------+--------+------------------+------------------+  CCA Distal 58       9                                    intimal thickening   +----------+--------+--------+--------+------------------+------------------+  ICA Prox   46       15       1-39%                       intimal thickening  +----------+--------+--------+--------+------------------+------------------+  ICA Mid    40       13                                                       +----------+--------+--------+--------+------------------+------------------+  ICA Distal 42       13                                                       +----------+--------+--------+--------+------------------+------------------+  ECA        39       10                                                       +----------+--------+--------+--------+------------------+------------------+ +----------+--------+-------+----------------+-------------------+             PSV cm/s EDV cms Describe         Arm Pressure (mmHG)  +----------+--------+-------+----------------+-------------------+  Subclavian 142              Multiphasic, WNL                      +----------+--------+-------+----------------+-------------------+ +---------+--------+--+--------+--+---------+  Vertebral PSV cm/s 48 EDV cm/s 14 Antegrade  +---------+--------+--+--------+--+---------+  Left Carotid Findings: +----------+--------+--------+--------+------------------+------------------+             PSV cm/s EDV cm/s Stenosis Plaque Description Comments            +----------+--------+--------+--------+------------------+------------------+  CCA Prox   64       13                                                       +----------+--------+--------+--------+------------------+------------------+  CCA Distal 45       9                                    intimal thickening  +----------+--------+--------+--------+------------------+------------------+  ICA  Prox   35       11       1-39%                       intimal thickening  +----------+--------+--------+--------+------------------+------------------+  ICA Mid    43       14                                                        +----------+--------+--------+--------+------------------+------------------+  ICA Distal 52       17                                                       +----------+--------+--------+--------+------------------+------------------+  ECA        39       9                                                        +----------+--------+--------+--------+------------------+------------------+ +----------+--------+--------+----------------+-------------------+             PSV cm/s EDV cm/s Describe         Arm Pressure (mmHG)  +----------+--------+--------+----------------+-------------------+  Subclavian 112               Multiphasic, WNL                      +----------+--------+--------+----------------+-------------------+ +---------+--------+--+--------+--+---------+  Vertebral PSV cm/s 35 EDV cm/s 13 Antegrade  +---------+--------+--+--------+--+---------+   Summary: Right Carotid: Velocities in the right ICA are consistent with a 1-39% stenosis. Left Carotid: Velocities in the left ICA are consistent with a 1-39% stenosis. Vertebrals:  Bilateral vertebral arteries demonstrate antegrade flow. Subclavians: Normal flow hemodynamics were seen in bilateral subclavian              arteries. *See table(s) above for measurements and observations.  Electronically signed by Monica Martinez MD on 08/01/2021 at 7:54:15 PM.    Final    VAS [place] LOWER EXTREMITY VENOUS (DVT)  Result Date: 08/04/2021  Lower Venous DVT Study Patient Name:  TRYSTAN Allport  Date of Exam:   08/03/2021 Medical Rec #: [id number]    Accession #:    [id number] Date of Birth: 04-11-1966    [name] Patient Age:   64 years Exam Location:  [facility] Procedure:      VAS [place] LOWER EXTREMITY VENOUS (DVT) Referring Phys: Terrilee Croak --------------------------------------------------------------------------------  Indications: Stroke.  Limitations: Altered mental status and constant movement of left lower extremity.  Comparison Study: No prior study Performing Technologist: Sharion Dove RVS  Examination Guidelines: A complete evaluation includes B-mode imaging, spectral Doppler, color Doppler, and power Doppler as needed of all accessible portions of each vessel. Bilateral testing is considered an integral part of a complete examination. Limited examinations for reoccurring indications may be performed as noted. The reflux portion of the exam is performed with the patient in reverse Trendelenburg.  +---------+---------------+---------+-----------+----------+--------------+  RIGHT     Compressibility Phasicity Spontaneity Properties  Thrombus Aging  +---------+---------------+---------+-----------+----------+--------------+  CFV       Full            Yes       Yes                                    +---------+---------------+---------+-----------+----------+--------------+  SFJ       Full                                                             +---------+---------------+---------+-----------+----------+--------------+  FV Prox   Full                                                             +---------+---------------+---------+-----------+----------+--------------+  FV Mid    Full                                                             +---------+---------------+---------+-----------+----------+--------------+  FV Distal Full                                                             +---------+---------------+---------+-----------+----------+--------------+  PFV       Full            Yes       Yes                                    +---------+---------------+---------+-----------+----------+--------------+  POP       Full                                                             +---------+---------------+---------+-----------+----------+--------------+  PTV       Full                                                             +---------+---------------+---------+-----------+----------+--------------+  PERO       Full                                                             +---------+---------------+---------+-----------+----------+--------------+   +---------+---------------+---------+-----------+----------+-------------------+  LEFT      Compressibility Phasicity Spontaneity Properties Thrombus Aging       +---------+---------------+---------+-----------+----------+-------------------+  CFV       Full            Yes       Yes                                         +---------+---------------+---------+-----------+----------+-------------------+  SFJ       Full                                                                  +---------+---------------+---------+-----------+----------+-------------------+  FV Prox                   Yes       Yes                    patent by color and                                                              Doppler              +---------+---------------+---------+-----------+----------+-------------------+  FV Mid    Full                                                                  +---------+---------------+---------+-----------+----------+-------------------+  FV Distal Full                                                                  +---------+---------------+---------+-----------+----------+-------------------+  PFV                                                        patent by color and                                                              Doppler              +---------+---------------+---------+-----------+----------+-------------------+  POP       Full            Yes       Yes                                         +---------+---------------+---------+-----------+----------+-------------------+  PTV       Full                                                                  +---------+---------------+---------+-----------+----------+-------------------+  PERO                                                       Not well visualized   +---------+---------------+---------+-----------+----------+-------------------+     Summary: BILATERAL: -No evidence of popliteal cyst, bilaterally. RIGHT: - There is no evidence of deep vein thrombosis in the lower extremity. However, portions of this examination were limited- see technologist comments above.  LEFT: - There is no evidence of deep vein thrombosis in the lower extremity. However, portions of this examination were limited- see technologist comments above.  *See table(s) above for measurements and observations. Electronically signed by Orlie Pollen on 08/04/2021 at 12:07:03 PM.    Final    [place] EKG SITE RITE  Result Date: 08/01/2021 If Site Rite image not attached, placement could not be confirmed due to current cardiac rhythm.   Microbiology: Results for orders placed or performed during the hospital encounter of 07/30/21  Blood culture (routine x 2)     Status: None   Collection Time: 07/30/21  6:21 PM   Specimen: BLOOD  Result Value Ref Range Status   Specimen Description   Final    BLOOD LEFT ANTECUBITAL Performed at [facility] [address], [place], [place] [address]    Special Requests   Final    BOTTLES DRAWN AEROBIC AND ANAEROBIC Blood Culture results may not be optimal due to an inadequate volume of blood received in culture bottles Performed at [facility] [address], [place], [place] [address]    Culture   Final    NO GROWTH 5 DAYS Performed at [facility], [place] [address], [place], [place] [address]    Report Status 08/04/2021 FINAL  Final  Blood culture (routine x 2)     Status: None   Collection Time: 07/30/21  6:21 PM   Specimen: BLOOD  Result Value Ref Range Status   Specimen Description   Final    BLOOD RIGHT ANTECUBITAL Performed at [facility] [address] [place], [place] [address]    Special Requests   Final    BOTTLES DRAWN AEROBIC AND ANAEROBIC Blood Culture results  may not be optimal due to an inadequate volume of blood received in culture bottles Performed at [facility] [address] [place], [place] [address]    Culture   Final    NO GROWTH 5 DAYS Performed at [facility], [place] [address] [place], [place] [address]    Report Status 08/04/2021 FINAL  Final  Resp Panel by RT-PCR (Flu A&B, Covid) Nasopharyngeal Swab     Status: None   Collection Time: 07/30/21  6:25 PM   Specimen: Nasopharyngeal Swab; Nasopharyngeal(NP) swabs in vial transport medium  Result Value Ref Range Status   SARS Coronavirus 2 by RT PCR NEGATIVE NEGATIVE Final    Comment: (NOTE) SARS-CoV-2  target nucleic acids are NOT DETECTED.  The SARS-CoV-2 RNA is generally detectable in upper respiratory specimens during the acute phase of infection. The lowest concentration of SARS-CoV-2 viral copies this assay can detect is 138 copies/mL. A negative result does not preclude SARS-Cov-2 infection and should not be used as the sole basis for treatment or other patient management decisions. A negative result may occur with  improper specimen collection/handling, submission of specimen other than nasopharyngeal swab, presence of viral mutation(s) within the areas targeted by this assay, and inadequate number of viral copies(<138 copies/mL). A negative result must be combined with clinical observations, patient history, and epidemiological information. The expected result is Negative.  Fact Sheet for Patients:  EntrepreneurPulse.com.au  Fact Sheet for Healthcare Providers:  IncredibleEmployment.be  This test is no t yet approved or cleared by the [place] FDA and  has been authorized for detection and/or diagnosis of SARS-CoV-2 by FDA under an Emergency Use Authorization (EUA). This EUA will remain  in effect (meaning this test can be used) for the duration of the COVID-19 declaration under Section 564(b)(1) of  the Act, 21 U.S.C.section 360bbb-3(b)(1), unless the authorization is terminated  or revoked sooner.       Influenza A by PCR NEGATIVE NEGATIVE Final   Influenza B by PCR NEGATIVE NEGATIVE Final    Comment: (NOTE) The Xpert Xpress SARS-CoV-2/FLU/RSV plus assay is intended as an aid in the diagnosis of influenza from Nasopharyngeal swab specimens and should not be used as a sole basis for treatment. Nasal washings and aspirates are unacceptable for Xpert Xpress SARS-CoV-2/FLU/RSV testing.  Fact Sheet for Patients: EntrepreneurPulse.com.au  Fact Sheet for Healthcare Providers: IncredibleEmployment.be  This test is not yet approved or cleared by the [place] FDA and has been authorized for detection and/or diagnosis of SARS-CoV-2 by FDA under an Emergency Use Authorization (EUA). This EUA will remain in effect (meaning this test can be used) for the duration of the COVID-19 declaration under Section 564(b)(1) of the Act, 21 U.S.C. section 360bbb-3(b)(1), unless the authorization is terminated or revoked.  Performed at [facility], [place] [address], [place], [place] [address]   Urine Culture     Status: Abnormal   Collection Time: 07/30/21  8:46 PM   Specimen: Urine, Clean Catch  Result Value Ref Range Status   Specimen Description   Final    URINE, CLEAN CATCH Performed at [facility], [place] [address] [place], [place] [address]    Special Requests   Final    NONE Performed at [facility], [place] [address] [place], [place] [address]    Culture 10,000 COLONIES/mL ENTEROCOCCUS FAECALIS (A)  Final   Report Status 08/02/2021 FINAL  Final   Organism ID, Bacteria ENTEROCOCCUS FAECALIS (A)  Final      Susceptibility   Enterococcus faecalis - MIC*    AMPICILLIN <=2 SENSITIVE Sensitive     NITROFURANTOIN <=16 SENSITIVE Sensitive     VANCOMYCIN 2 SENSITIVE Sensitive     * 10,000  COLONIES/mL ENTEROCOCCUS FAECALIS  MRSA Next Gen by PCR, Nasal     Status: None   Collection Time: 07/31/21  1:13 AM   Specimen: Nasal Mucosa; Nasal Swab  Result Value Ref Range Status   MRSA by PCR Next Gen NOT DETECTED NOT DETECTED Final    Comment: (NOTE) The GeneXpert MRSA Assay (FDA approved for NASAL specimens only), is one component of a comprehensive MRSA colonization surveillance program. It is not intended to diagnose MRSA  infection nor to guide or monitor treatment for MRSA infections. Test performance is not FDA approved in patients less than 4 years old. Performed at [facility], [place] [address], [place], [place] [address]   CSF culture w Gram Stain     Status: None   Collection Time: 08/01/21 12:45 PM   Specimen: CSF; Cerebrospinal Fluid  Result Value Ref Range Status   Specimen Description CSF  Final   Special Requests NONE  Final   Gram Stain   Final    WBC PRESENT, PREDOMINANTLY MONONUCLEAR NO ORGANISMS SEEN CYTOSPIN SMEAR    Culture   Final    NO GROWTH 3 DAYS Performed at [facility], [place] [address], [place], [place] [address]    Report Status 08/04/2021 FINAL  Final    Labs: CBC: Recent Labs  Lab 08/08/21 1200 08/10/21 1502 08/11/21 0224 08/12/21 0113 08/13/21 0049 08/14/21 0051  WBC 10.2 14.1* 15.2* 14.6* 12.5* 13.4*  NEUTROABS 7.9* 10.8*  --   --   --   --   HGB 14.5 16.7* 15.7* 13.6 13.8 14.6  HCT 41.6 47.1* 45.3 39.8 40.8 41.0  MCV 92.2 90.1 91.3 92.3 91.3 91.3  PLT 360 407* 425* 392 426* 349*   Basic Metabolic Panel: Recent Labs  Lab 08/08/21 1200 08/10/21 1502 08/12/21 0113 08/13/21 0049 08/14/21 0051  NA 144 138 138 135 134*  K 3.6 3.7 4.6 4.1 4.5  CL 109 108 107 102 99  CO2 26 21* _0 GLUCOSE 160* 207* 222* 219* 259*  BUN 23* _1 CREATININE 0.87 0.77 0.86 0.86 0.95  CALCIUM 8.9 8.7* 8.5* 8.9 9.0  MG 2.3  --   --   --   --   PHOS 2.8  --   --   --   --    Liver Function  Tests: Recent Labs  Lab 08/10/21 1502 08/12/21 0113  AST 39 34  ALT 48* 42  ALKPHOS 89 97  BILITOT 0.9 0.6  PROT 6.3* 5.8*  ALBUMIN 3.4* 3.1*   CBG: Recent Labs  Lab 08/13/21 1136 08/13/21 1716 08/13/21 1941 08/13/21 2146 08/14/21 0613  GLUCAP 209* 270* 293* 271* 130*    Discharge time spent: less than 30 minutes.  Signed:  Annita Brod MD.  [facility] 08/14/2021

## 2021-08-14 NOTE — TOC Transition Note (Signed)
Transition of Care [facility]) - CM/SW Discharge Note   Patient Details  Name: Dorothy Alvarez MRN: [id number] Date of Birth: November 24, 1965  [facility] CM/SW Contact:  Geralynn Ochs, LCSW Phone Number: 08/14/2021, 11:11 AM   Clinical Narrative:   CSW confirmed with daughter, Phineas Real, that family was ready for patient to discharge. CSW updated [facility] about discharge today, they will follow up for home health visit tomorrow. PTAR scheduled for noon today. No other TOC needs at this time.    Final next level of care: [place] Barriers to Discharge: Barriers Resolved   Patient Goals and CMS Choice Patient states their goals for this hospitalization and ongoing recovery are:: patient unable to participate in goal setting, not oriented CMS Medicare.gov Compare Post Acute Care list provided to:: Patient Represenative (must comment) Choice offered to / list presented to : Adult Children  Discharge Placement                Patient to be transferred to facility by: [place] Name of family member notified: Latoya Patient and family notified of of transfer: 08/14/21  Discharge Plan and Services     Post Acute Care Choice: Home Health, Durable Medical Equipment                               Social Determinants of Health (SDOH) Interventions     Readmission Risk Interventions No flowsheet data found.

## 2021-08-27 ENCOUNTER — Encounter (HOSPITAL_COMMUNITY): Payer: Self-pay | Admitting: Radiology

## 2021-09-20 ENCOUNTER — Telehealth (HOSPITAL_COMMUNITY): Payer: Self-pay

## 2021-09-20 NOTE — Telephone Encounter (Signed)
Attempted to contact patient to schedule OP MBS - left voicemail. ?

## 2021-09-30 ENCOUNTER — Telehealth (HOSPITAL_COMMUNITY): Payer: Self-pay

## 2021-09-30 NOTE — Telephone Encounter (Signed)
2nd attempt to contact patient to schedule OP MBS - left voicemail. ?

## 2021-10-02 ENCOUNTER — Other Ambulatory Visit: Payer: Self-pay | Admitting: Internal Medicine

## 2021-10-03 LAB — COMPLETE METABOLIC PANEL WITH GFR
AG Ratio: 1.4 (calc) (ref 1.0–2.5)
ALT: 14 U/L (ref 6–29)
AST: 16 U/L (ref 10–35)
Albumin: 4.2 g/dL (ref 3.6–5.1)
Alkaline phosphatase (APISO): 97 U/L (ref 37–153)
BUN: 10 mg/dL (ref 7–25)
CO2: 32 mmol/L (ref 20–32)
Calcium: 9.7 mg/dL (ref 8.6–10.4)
Chloride: 101 mmol/L (ref 98–110)
Creat: 0.85 mg/dL (ref 0.50–1.03)
Globulin: 2.9 g/dL (calc) (ref 1.9–3.7)
Glucose, Bld: 105 mg/dL — ABNORMAL HIGH (ref 65–99)
Potassium: 4.1 mmol/L (ref 3.5–5.3)
Sodium: 141 mmol/L (ref 135–146)
Total Bilirubin: 0.9 mg/dL (ref 0.2–1.2)
Total Protein: 7.1 g/dL (ref 6.1–8.1)
eGFR: 80 mL/min/{1.73_m2} (ref >=60)

## 2021-10-03 LAB — CBC
HCT: 38.4 % (ref 35.0–45.0)
Hemoglobin: 13.1 g/dL (ref 11.7–15.5)
MCH: 31.4 pg (ref 27.0–33.0)
MCHC: 34.1 g/dL (ref 32.0–36.0)
MCV: 92.1 fL (ref 80.0–100.0)
MPV: 11.2 fL (ref 7.5–12.5)
Platelets: 298 10*3/uL (ref 140–400)
RBC: 4.17 10*6/uL (ref 3.80–5.10)
RDW: 12.7 % (ref 11.0–15.0)
WBC: 7.9 10*3/uL (ref 3.8–10.8)

## 2021-10-03 LAB — TSH: TSH: 1.7 mIU/L (ref 0.40–4.50)

## 2021-10-03 LAB — VITAMIN D 25 HYDROXY (VIT D DEFICIENCY, FRACTURES): Vit D, 25-Hydroxy: 38 ng/mL (ref 30–100)

## 2021-10-04 ENCOUNTER — Other Ambulatory Visit (HOSPITAL_COMMUNITY): Payer: Self-pay

## 2021-10-04 DIAGNOSIS — R1310 Dysphagia, unspecified: Secondary | ICD-10-CM

## 2021-10-08 ENCOUNTER — Other Ambulatory Visit: Payer: Self-pay

## 2021-10-08 ENCOUNTER — Ambulatory Visit (HOSPITAL_COMMUNITY)
Admission: RE | Admit: 2021-10-08 | Discharge: 2021-10-08 | Disposition: A | Discharge status: Home / Self Care | Payer: Medicare HMO | Source: Ambulatory Visit | Attending: Internal Medicine | Admitting: Internal Medicine

## 2021-10-08 ENCOUNTER — Encounter (HOSPITAL_COMMUNITY): Payer: Self-pay

## 2021-10-08 ENCOUNTER — Emergency Department (HOSPITAL_COMMUNITY): Payer: Medicare HMO

## 2021-10-08 ENCOUNTER — Emergency Department (HOSPITAL_COMMUNITY)
Admission: EM | Admit: 2021-10-08 | Discharge: 2021-10-08 | Disposition: A | Discharge status: Home / Self Care | Payer: Medicare HMO | Attending: Emergency Medicine | Admitting: Emergency Medicine

## 2021-10-08 DIAGNOSIS — Z431 Encounter for attention to gastrostomy: Secondary | ICD-10-CM | POA: Insufficient documentation

## 2021-10-08 DIAGNOSIS — Z79899 Other long term (current) drug therapy: Secondary | ICD-10-CM | POA: Insufficient documentation

## 2021-10-08 DIAGNOSIS — R1310 Dysphagia, unspecified: Secondary | ICD-10-CM | POA: Diagnosis not present

## 2021-10-08 DIAGNOSIS — Z7901 Long term (current) use of anticoagulants: Secondary | ICD-10-CM | POA: Insufficient documentation

## 2021-10-08 DIAGNOSIS — R109 Unspecified abdominal pain: Secondary | ICD-10-CM | POA: Diagnosis not present

## 2021-10-08 DIAGNOSIS — Z8673 Personal history of transient ischemic attack (TIA), and cerebral infarction without residual deficits: Secondary | ICD-10-CM | POA: Insufficient documentation

## 2021-10-08 DIAGNOSIS — I509 Heart failure, unspecified: Secondary | ICD-10-CM | POA: Insufficient documentation

## 2021-10-08 DIAGNOSIS — I110 Hypertensive heart disease with heart failure: Secondary | ICD-10-CM | POA: Insufficient documentation

## 2021-10-08 DIAGNOSIS — Z4659 Encounter for fitting and adjustment of other gastrointestinal appliance and device: Secondary | ICD-10-CM

## 2021-10-08 LAB — CBC WITH DIFFERENTIAL/PLATELET
Abs Immature Granulocytes: 0.04 10*3/uL (ref 0.00–0.07)
Basophils Absolute: 0.1 10*3/uL (ref 0.0–0.1)
Basophils Relative: 1 %
Eosinophils Absolute: 0.3 10*3/uL (ref 0.0–0.5)
Eosinophils Relative: 4 %
HCT: 37.1 % (ref 36.0–46.0)
Hemoglobin: 12.2 g/dL (ref 12.0–15.0)
Immature Granulocytes: 1 %
Lymphocytes Relative: 34 %
Lymphs Abs: 2.8 10*3/uL (ref 0.7–4.0)
MCH: 32.1 pg (ref 26.0–34.0)
MCHC: 32.9 g/dL (ref 30.0–36.0)
MCV: 97.6 fL (ref 80.0–100.0)
Monocytes Absolute: 0.6 10*3/uL (ref 0.1–1.0)
Monocytes Relative: 7 %
Neutro Abs: 4.5 10*3/uL (ref 1.7–7.7)
Neutrophils Relative %: 53 %
Platelets: 275 10*3/uL (ref 150–400)
RBC: 3.8 MIL/uL — ABNORMAL LOW (ref 3.87–5.11)
RDW: 13.2 % (ref 11.5–15.5)
WBC: 8.2 10*3/uL (ref 4.0–10.5)
nRBC: 0 % (ref 0.0–0.2)

## 2021-10-08 LAB — BASIC METABOLIC PANEL
Anion gap: 10 (ref 5–15)
BUN: 8 mg/dL (ref 6–20)
CO2: 30 mmol/L (ref 22–32)
Calcium: 9 mg/dL (ref 8.9–10.3)
Chloride: 100 mmol/L (ref 98–111)
Creatinine, Ser: 0.89 mg/dL (ref 0.44–1.00)
GFR, Estimated: >60 mL/min (ref >60)
Glucose, Bld: 99 mg/dL (ref 70–99)
Potassium: 3.5 mmol/L (ref 3.5–5.1)
Sodium: 140 mmol/L (ref 135–145)

## 2021-10-08 MED ORDER — IOHEXOL 300 MG/ML  SOLN
100.0000 mL | Freq: Once | INTRAMUSCULAR | Status: AC | PRN
  Administered 2021-10-08: 100 mL via INTRAVENOUS

## 2021-10-08 MED ORDER — FENTANYL CITRATE PF 50 MCG/ML IJ SOSY
50.0000 ug | PREFILLED_SYRINGE | Freq: Once | INTRAMUSCULAR | Status: AC
  Administered 2021-10-08: 50 ug via INTRAVENOUS
  Filled 2021-10-08: qty 1

## 2021-10-08 NOTE — ED Provider Notes (Addendum)
[facility] EMERGENCY DEPARTMENT Provider Note   CSN: [id number] Arrival date & time: 10/08/21  1624     History  Chief Complaint  Patient presents with   Feeding Tube Issue    Dorothy Alvarez is a 56 y.o. female.  HPI Patient presents for pain around her feeding tube site.  Her medical problems include HTN, obesity, depression, anxiety, CHF, CVA.  Recent history is as follows: Patient was admitted to the hospital in December for septic shock and found to have multifocal strokes.  She had dysphagia and a PEG tube was placed on 12/22.  Since that time, she has recovered from her stroke.  She has been taking all food medicine by mouth for the past 3 weeks.  Her G-tube has not been used over this time.  Her husband has been flushing it.  Last time it was flushed was this morning.  Patient underwent a outpatient swallow study earlier today.  When she got home, she was complaining of abdominal pain in the region around the G-tube site.  Pain does not radiate.  She has not felt feverish.  Has reports that the noticeable deficit since her stroke has been short-term memory loss and confusion.    Home Medications Prior to Admission medications   Medication Sig Start Date End Date Taking? Authorizing Provider  acetaminophen (TYLENOL) 325 MG tablet Place 2 tablets (650 mg total) into feeding tube every 4 (four) hours as needed for mild pain (or temp > 37.5 C (99.5 F)). 08/14/21   Annita Brod, MD  apixaban (ELIQUIS) 5 MG TABS tablet Place 1 tablet (5 mg total) into feeding tube 2 (two) times daily. 08/14/21   Annita Brod, MD  atorvastatin (LIPITOR) 80 MG tablet Place 1 tablet (80 mg total) into feeding tube daily. 08/15/21   Annita Brod, MD  carvedilol (COREG) 6.25 MG tablet Place 1 tablet (6.25 mg total) into feeding tube 2 (two) times daily with a meal. 08/14/21   Annita Brod, MD  cloNIDine (CATAPRES) 0.1 MG tablet Place 1 tablet (0.1 mg total) into feeding  tube 2 (two) times daily. 08/14/21   Annita Brod, MD  furosemide (LASIX) 80 MG tablet Take 1 tablet (80 mg total) by mouth daily. 05/04/20 07/31/22  Florencia Reasons, MD  isosorbide dinitrate (ISORDIL) 10 MG tablet Place 1 tablet (10 mg total) into feeding tube 3 (three) times daily. 08/14/21   Annita Brod, MD  methylphenidate (RITALIN) 5 MG tablet Place 1 tablet (5 mg total) into feeding tube 2 (two) times daily with breakfast and lunch. 08/14/21   Annita Brod, MD  Nutritional Supplements (FEEDING SUPPLEMENT, OSMOLITE 1.5 CAL,) LIQD Place 355 mLs into feeding tube 4 (four) times daily. 08/14/21   Annita Brod, MD  potassium chloride (KLOR-CON) 20 MEQ packet Place 20 mEq into feeding tube 2 (two) times daily. 08/14/21   Annita Brod, MD  sacubitril-valsartan (ENTRESTO) 24-26 MG Take 1 tablet by mouth 2 (two) times daily. 08/14/21   Annita Brod, MD      Allergies    Ketorolac, Morphine and related, Nsaids, and Trazodone and nefazodone    Review of Systems   Review of Systems  Gastrointestinal:  Positive for abdominal pain.  Psychiatric/Behavioral:  Positive for confusion (Baseline since recent CVA).   All other systems reviewed and are negative.  Physical Exam Updated Vital Signs BP (!) 169/94    Pulse 72    Temp 98.5 F (36.9 C) (Oral)  Resp 20    Ht 5\' 6"  (1.676 m)    Wt 98 kg    SpO2 100%    BMI 34.87 kg/m  Physical Exam Vitals and nursing note reviewed.  Constitutional:      General: She is not in acute distress.    Appearance: Normal appearance. She is well-developed. She is not ill-appearing, toxic-appearing or diaphoretic.  HENT:     Head: Normocephalic and atraumatic.     Right Ear: External ear normal.     Left Ear: External ear normal.     Nose: Nose normal.     Mouth/Throat:     Mouth: Mucous membranes are moist.     Pharynx: Oropharynx is clear.  Eyes:     General: No scleral icterus.    Extraocular Movements: Extraocular movements  intact.     Conjunctiva/sclera: Conjunctivae normal.  Cardiovascular:     Rate and Rhythm: Normal rate and regular rhythm.     Heart sounds: No murmur heard. Pulmonary:     Effort: Pulmonary effort is normal. No respiratory distress.  Abdominal:     Palpations: Abdomen is soft.     Tenderness: There is abdominal tenderness. There is no guarding.     Comments: G-tube in lower abdomen.  No purulence or surrounding erythema at G-tube site.  Musculoskeletal:        General: No swelling. Normal range of motion.     Cervical back: Normal range of motion and neck supple. No rigidity.     Right lower leg: No edema.     Left lower leg: No edema.  Skin:    General: Skin is warm.     Capillary Refill: Capillary refill takes less than 2 seconds.     Coloration: Skin is not jaundiced or pale.  Neurological:     General: No focal deficit present.     Mental Status: She is alert.  Psychiatric:        Mood and Affect: Mood normal.        Speech: Speech normal.        Behavior: Behavior normal. Behavior is cooperative.        Cognition and Memory: Memory is impaired.     ED Results / Procedures / Treatments   Labs (all labs ordered are listed, but only abnormal results are displayed) Labs Reviewed  CBC WITH DIFFERENTIAL/PLATELET - Abnormal; Notable for the following components:      Result Value   RBC 3.80 (*)    All other components within normal limits  BASIC METABOLIC PANEL    EKG None  Radiology CT ABDOMEN PELVIS W CONTRAST  Result Date: 10/08/2021 CLINICAL DATA:  Abdominal pain EXAM: CT ABDOMEN AND PELVIS WITH CONTRAST TECHNIQUE: Multidetector CT imaging of the abdomen and pelvis was performed using the standard protocol following bolus administration of intravenous contrast. RADIATION DOSE REDUCTION: This exam was performed according to the departmental dose-optimization program which includes automated exposure control, adjustment of the mA and/or kV according to patient size  and/or use of iterative reconstruction technique. CONTRAST:  128mL OMNIPAQUE IOHEXOL 300 MG/ML  SOLN COMPARISON:  07/30/2021 FINDINGS: Lower chest: Small linear densities in the lower lung fields suggest minimal scarring. Hepatobiliary: In image 13 of series 3, there is 4.2 x 2.5 cm low-density lesion with peripheral nodular enhancement in the right lobe. There are few small fluid density lesions in the left lobe each measuring less than 10 mm. Gallbladder is not distended. Pancreas: There is slight prominence of pancreatic  duct. No focal abnormality is seen. Spleen: Unremarkable. Adrenals/Urinary Tract: 5.1 x 4.8 cm smooth marginated exophytic enhancing lesion inseparable from the lateral margin of left kidney. In image 36, there is 13 mm low-density in the anterior aspect of left kidney. There is minimal cortical thinning in the lower pole of left kidney. There are no demonstrable renal or ureteral stones. Urinary bladder is unremarkable. There is interval resolution of emphysematous cystitis. Stomach/Bowel: Stomach is not distended. Gastrostomy tube is noted with its tip in the lumen of stomach. Small bowel loops are not dilated. Appendix is not distinctly seen. There is no pericecal inflammation. There is no significant wall thickening in colon. Scattered diverticula are seen in colon without signs of focal diverticulitis. Vascular/Lymphatic: Unremarkable. Reproductive: Uterus is not seen. There are no dominant adnexal masses. Other: There is no ascites or pneumoperitoneum. Musculoskeletal: Degenerative changes are noted in lumbar spine, particularly severe at L5-S1 level with marked encroachment of neural foramina. IMPRESSION: There is 5.1 cm enhancing solid mass inseparable from the lateral margin of upper pole of left kidney. Possibility of malignant neoplasm such as renal cell carcinoma is not excluded. Follow-up PET-CT and biopsy as warranted should be considered. There is no evidence of intestinal  obstruction or pneumoperitoneum. Tip of gastrostomy tube is seen within the lumen of the stomach. There is no hydronephrosis. There is 4.2 cm low-density structure with peripheral nodular enhancement in the right lobe of liver suggesting hemangioma. There are possible small cysts in the left lobe of liver. Possible small left renal cyst. Diverticulosis of colon without signs of focal diverticulitis. Other findings as described in the body of the report. Electronically Signed   By: Elmer Picker M.D.   On: 10/08/2021 19:28   DG SWALLOW FUNC OP MEDICARE SPEECH PATH  Result Date: 10/08/2021 Table formatting from the original result was not included. Objective Swallowing Evaluation: Type of Study: MBS-Modified Barium Swallow Study  Patient Details Name: Barri Neidlinger MRN: [id number] Date of Birth: 1966-03-10 Today's Date: 10/08/2021 Time: SLP Start Time (ACUTE ONLY): 1205 -SLP Stop Time (ACUTE ONLY): 1220 SLP Time Calculation (min) (ACUTE ONLY): 15 min Past Medical History: Past Medical History: Diagnosis Date  Allergy   Anxiety   Arthritis   Chronic abdominal pain   Constipation   Depression   Hypertension   IBS (irritable bowel syndrome)   Learning disability   Neuromuscular disorder ([place])  Past Surgical History: Past Surgical History: Procedure Laterality Date  ABDOMINAL HYSTERECTOMY    GALLBLADDER SURGERY    IR GASTROSTOMY TUBE MOD SED  08/07/2021  KNEE SURGERY    RIGHT/LEFT HEART CATH AND CORONARY ANGIOGRAPHY N/A 05/04/2020  Procedure: RIGHT/LEFT HEART CATH AND CORONARY ANGIOGRAPHY;  Surgeon: Belva Crome, MD;  Location: [facility];  Service: Cardiovascular;  Laterality: N/A; HPI: Dorothy Alvarez is a 56 y.o. female who was referred for OP MBS to determine if G-tube can be removed. Per her family, she is eating regular foods and drinking thin liquids without any difficulty.  She was hospitalized in December of 2022 after multifocal infarcts including left PCA, right cerebellum and bilateral frontal and  right thalamus; morbid obesity, bipolar disorder, hypertension. Had dysphagia, PEG 12/21.  Subjective: friendly; Participatory  Recommendations for follow up therapy are one component of a multi-disciplinary discharge planning process, led by the attending physician.  Recommendations may be updated based on patient status, additional functional criteria and insurance authorization. Assessment / Plan / Recommendation Clinical Impressions 10/08/2021 Clinical Impression Pt presents with a normal oropharyngeal  swallow c/b normal oral phase with thorough mastication; timely swallow response; effective pharyngeal stripping and reliable laryngeal vestibule closure with no aspiration, high brief penetration (PAS 2) - considered WFL.  She was very happy with results. No dysphagia. Continue regular diet,thin liquids, meds whole with water. No SLP f/u needed for swallowing. SLP Visit Diagnosis Dysphagia, unspecified (R13.10) Attention and concentration deficit following -- Frontal lobe and executive function deficit following -- Impact on safety and function No limitations   Treatment Recommendations 10/08/2021 Treatment Recommendations No treatment recommended at this time   No flowsheet data found. Diet Recommendations 10/08/2021 SLP Diet Recommendations Regular solids;Thin liquid Liquid Administration via Cup;Straw Medication Administration Whole meds with liquid Compensations -- Postural Changes --   Other Recommendations 10/08/2021 Recommended Consults -- Oral Care Recommendations Oral care BID Other Recommendations -- Follow Up Recommendations -- Assistance recommended at discharge -- Functional Status Assessment -- Frequency and Duration  08/06/2021 Speech Therapy Frequency (ACUTE ONLY) min 2x/week Treatment Duration --   Oral Phase 10/08/2021 Oral Phase WFL Oral - Pudding Teaspoon -- Oral - Pudding Cup -- Oral - Honey Teaspoon -- Oral - Honey Cup -- Oral - Nectar Teaspoon -- Oral - Nectar Cup -- Oral - Nectar Straw -- Oral  - Thin Teaspoon -- Oral - Thin Cup -- Oral - Thin Straw -- Oral - Puree -- Oral - Mech Soft -- Oral - Regular -- Oral - Multi-Consistency -- Oral - Pill -- Oral Phase - Comment --  Pharyngeal Phase 10/08/2021 Pharyngeal Phase WFL Pharyngeal- Pudding Teaspoon -- Pharyngeal -- Pharyngeal- Pudding Cup -- Pharyngeal -- Pharyngeal- Honey Teaspoon -- Pharyngeal -- Pharyngeal- Honey Cup -- Pharyngeal -- Pharyngeal- Nectar Teaspoon -- Pharyngeal -- Pharyngeal- Nectar Cup -- Pharyngeal -- Pharyngeal- Nectar Straw -- Pharyngeal -- Pharyngeal- Thin Teaspoon -- Pharyngeal -- Pharyngeal- Thin Cup -- Pharyngeal -- Pharyngeal- Thin Straw -- Pharyngeal -- Pharyngeal- Puree -- Pharyngeal -- Pharyngeal- Mechanical Soft -- Pharyngeal -- Pharyngeal- Regular -- Pharyngeal -- Pharyngeal- Multi-consistency -- Pharyngeal -- Pharyngeal- Pill -- Pharyngeal -- Pharyngeal Comment --  No flowsheet data found. Juan Quam Laurice 10/08/2021, 1:30 PM                   CLINICAL DATA:  Dysphagia EXAM: MODIFIED BARIUM SWALLOW TECHNIQUE: Different consistencies of barium were administered orally to the patient by the Speech Pathologist. Imaging of the pharynx was performed in the lateral projection. The APP was present in the fluoroscopy room for this study, providing personal supervision. FLUOROSCOPY: Fluoroscopy Time:  1 minute 18 seconds Radiation Exposure Index (if provided by the fluoroscopic device): 14 mGy Number of Acquired Spot Images: 0 COMPARISON:  None FINDINGS: Swallowing performed across multiple consistencies. Lateral projection shows robust moderately large anterior osteophytes. Flash penetration without visible aspiration. IMPRESSION: Flash penetration without visible aspiration. This exam was performed by Forde Radon in conjunction with speech pathology, and was supervised and interpreted by Zetta Bills Please refer to the Speech Pathologists report for complete details and recommendations. Electronically Signed   By:  Zetta Bills M.D.   On: 10/08/2021 12:57    Procedures Procedures    Medications Ordered in ED Medications  fentaNYL (SUBLIMAZE) injection 50 mcg (50 mcg Intravenous Given 10/08/21 1816)  iohexol (OMNIPAQUE) 300 MG/ML solution 100 mL (100 mLs Intravenous Contrast Given 10/08/21 1855)    ED Course/ Medical Decision Making/ A&P  Medical Decision Making Amount and/or Complexity of Data Reviewed Radiology: ordered.  Risk Prescription drug management.   This patient presents to the ED for concern of abdominal pain, this involves an extensive number of treatment options, and is a complaint that carries with it a high risk of complications and morbidity.  The differential diagnosis includes migration of PEG tube, intra-abdominal abscess, gastritis, small bowel obstruction   Co morbidities that complicate the patient evaluation  HTN, obesity, depression, anxiety, CHF, CVA   Additional history obtained:  Additional history obtained from patient's husband External records from outside source obtained and reviewed including EMR   Lab Tests:  I Ordered, and personally interpreted labs.  The pertinent results include: Normal findings   Imaging Studies ordered:  I ordered imaging studies including CT of abdomen and pelvis I independently visualized and interpreted imaging which showed no acute findings.  PEG tube is in proper position.  There is a previously identified left kidney mass that is concerning for RCC. I agree with the radiologist interpretation   Cardiac Monitoring:  The patient was maintained on a cardiac monitor.  I personally viewed and interpreted the cardiac monitored which showed an underlying rhythm of: Sinus rhythm   Medicines ordered and prescription drug management:  I ordered medication including fentanyl for analgesia Reevaluation of the patient after these medicines showed that the patient improved I have reviewed the  patients home medicines and have made adjustments as needed  Consultations Obtained:  I requested consultation with the interventional radiologist,  and discussed lab and imaging findings as well as pertinent plan - they recommend: Removal of PEG tube   Problem List / ED Course:  Patient is a 56 year old female who presents for pain around the site of her PEG tube.  She had a recent hospitalization for septic shock.  During this time, she had multifocal strokes.  She had dysphagia from this and had a PEG tube placed on 12/22.  This is almost exactly 2 months ago.  She has since had pain around the PEG tube site.  For this reason, she presents to the ED.  Patient reports that she has not used her PEG tube at all in the past 3 weeks.  She has been taking all food, drink, and medications by mouth.  Her husband, who accompanies her at bedside corroborates this.  Patient does have significant memory loss since her last hospitalization.  It is difficult to ascertain how long she has been experiencing this discomfort.  Patient's husband states that she first mentioned it today.  On exam, area around PEG tube is nonerythematous and nonpurulent.  She does have mild tenderness.  A CT scan was ordered to assess for positioning of PEG tube as well as underlying etiologies of this discomfort.  Results of CT scan showed a properly positioned PEG tube with no findings in the area of her discomfort to explain her pain.  There was a redemonstration of a left kidney mass.  Findings on CT are concerning for RCC.  I spoke with the patient and her husband regarding this.  It was identified during her recent hospitalization but they have yet to schedule any follow-up to address this.  They were informed that they will need a urology follow-up and encouraged to do so as soon as possible.  I spoke with interventional radiologist on-call.  Recommendations from IR are for PEG tube removal.  This was performed at bedside.  Patient  was advised to stay n.p.o. for the next  12 hours and then to advance her diet slowly to assist with tract closure.  Contact information was given for urology follow-up.  Patient was discharged in good condition.   Reevaluation:  After the interventions noted above, I reevaluated the patient and found that they have :improved   Social Determinants of Health:  Short-term memory loss and confusion following recent strokes   Dispostion:  After consideration of the diagnostic results and the patients response to treatment, I feel that the patent would benefit from discharge.          Final Clinical Impression(s) / ED Diagnoses Final diagnoses:  Encounter for care related to feeding tube    Rx / DC Orders ED Discharge Orders     None         Godfrey Pick, MD 10/09/21 1401    Godfrey Pick, MD 10/09/21 [phone]

## 2021-10-08 NOTE — Discharge Instructions (Addendum)
Your feeding tube was removed.  Keep the area covered with bandage over the next several days.  Do not eat any foods for the next 12 hours.  You will likely experience some drainage from the site but the tract should close within the next several days.  In regards to your kidney mass.  You will need to follow-up with urology.  The telephone number to call is below.  Call them in the morning to set up an appointment for soon as possible.

## 2021-10-08 NOTE — ED Triage Notes (Signed)
Pt arrives POV for eval of abd pain d/t feeding tube. Pt had G tube placed in December, states last 2-3 days has been having increasing pain. Pt was here this AM for swallow test and then this afternoon reported pain to her family. G tube site appears slightly reddened with some ?purulent DC. Pt reports significant pain to site, denies fevers at home, reports tube is working properly

## 2021-10-08 NOTE — ED Provider Triage Note (Signed)
Emergency Medicine Provider Triage Evaluation Note  Dorothy Alvarez , a 56 y.o. female  was evaluated in triage.  Pt complains of feeding tube problem.  She states that she got her feeding tube back in December because she had a stroke and was initially having swallowing difficulties and was unresponsive.  Ever since then, she started to improve and is back to her baseline mental status.  She has been eating foods normally.  Apparently she had a swallowing test today that was good.  Patient states that she has had pain around her feeding tube ever since it was inserted and today got to the point where it is unbearable.  She says the feeding tube is working was not clogged.  She has not been needing to use it at all.  She would like to have it removed here in the emergency department.  Review of Systems  Positive:  Negative:   Physical Exam  BP (!) 124/91 (BP Location: Right Arm)    Pulse 81    Temp 98.4 F (36.9 C) (Oral)    Resp 16    Ht 5\' 6"  (1.676 m)    Wt 98 kg    SpO2 100%    BMI 34.87 kg/m  Gen:   Awake, no distress   Resp:  Normal effort  MSK:   Moves extremities without difficulty  Other:  PEG tube in place.  There is no surrounding erythema or swelling.  There is scant purulent discharge coming from the site.  It is tender to touch surrounding the site.  Medical Decision Making  Medically screening exam initiated at 4:48 PM.  Appropriate orders placed.  [name] was informed that the remainder of the evaluation will be completed by another provider, this initial triage assessment does not replace that evaluation, and the importance of remaining in the ED until their evaluation is complete.    Adolphus Birchwood, PA-C 10/08/21 1650

## 2021-10-15 ENCOUNTER — Telehealth: Payer: Self-pay | Admitting: Diagnostic Neuroimaging

## 2021-10-15 NOTE — Telephone Encounter (Signed)
I called the home number and it said no voice mail is set up, so I called the cell number and lvm letting the patient know her appointment was moved up from April 10 to April 4 due to Dr. Leta Baptist being out and to call us back if the new appointment will not work for her.

## 2021-10-23 ENCOUNTER — Other Ambulatory Visit: Payer: Self-pay | Admitting: Urology

## 2021-10-23 DIAGNOSIS — D49512 Neoplasm of unspecified behavior of left kidney: Secondary | ICD-10-CM

## 2021-11-18 ENCOUNTER — Ambulatory Visit
Admission: RE | Admit: 2021-11-18 | Discharge: 2021-11-18 | Disposition: A | Discharge status: Home / Self Care | Payer: Medicare HMO | Source: Ambulatory Visit | Attending: Urology | Admitting: Urology

## 2021-11-18 ENCOUNTER — Other Ambulatory Visit: Payer: Self-pay | Admitting: Urology

## 2021-11-18 ENCOUNTER — Encounter: Payer: Self-pay | Admitting: *Deleted

## 2021-11-18 DIAGNOSIS — D49512 Neoplasm of unspecified behavior of left kidney: Secondary | ICD-10-CM

## 2021-11-18 MED ORDER — GADOBENATE DIMEGLUMINE 529 MG/ML IV SOLN
18.0000 mL | Freq: Once | INTRAVENOUS | Status: AC | PRN
  Administered 2021-11-18: 18 mL via INTRAVENOUS

## 2021-11-19 ENCOUNTER — Ambulatory Visit: Payer: Medicare HMO | Admitting: Diagnostic Neuroimaging

## 2021-11-21 ENCOUNTER — Encounter: Payer: Self-pay | Admitting: Diagnostic Neuroimaging

## 2021-11-25 ENCOUNTER — Ambulatory Visit: Payer: Medicare HMO | Admitting: Diagnostic Neuroimaging

## 2021-12-03 ENCOUNTER — Emergency Department (HOSPITAL_COMMUNITY)
Admission: EM | Admit: 2021-12-03 | Discharge: 2021-12-04 | Disposition: A | Discharge status: Other Acute Inpatient Hospital | Payer: Medicare HMO | Attending: Emergency Medicine | Admitting: Emergency Medicine

## 2021-12-03 ENCOUNTER — Emergency Department (HOSPITAL_COMMUNITY): Payer: Medicare HMO

## 2021-12-03 DIAGNOSIS — F329 Major depressive disorder, single episode, unspecified: Secondary | ICD-10-CM | POA: Insufficient documentation

## 2021-12-03 DIAGNOSIS — R443 Hallucinations, unspecified: Secondary | ICD-10-CM | POA: Insufficient documentation

## 2021-12-03 DIAGNOSIS — R45851 Suicidal ideations: Secondary | ICD-10-CM | POA: Insufficient documentation

## 2021-12-03 DIAGNOSIS — I509 Heart failure, unspecified: Secondary | ICD-10-CM | POA: Insufficient documentation

## 2021-12-03 DIAGNOSIS — Z20822 Contact with and (suspected) exposure to covid-19: Secondary | ICD-10-CM | POA: Diagnosis not present

## 2021-12-03 DIAGNOSIS — Y900 Blood alcohol level of less than 20 mg/100 ml: Secondary | ICD-10-CM | POA: Insufficient documentation

## 2021-12-03 DIAGNOSIS — Z046 Encounter for general psychiatric examination, requested by authority: Secondary | ICD-10-CM | POA: Diagnosis present

## 2021-12-03 LAB — RAPID URINE DRUG SCREEN, HOSP PERFORMED
Amphetamines: NOT DETECTED
Barbiturates: NOT DETECTED
Benzodiazepines: NOT DETECTED
Cocaine: NOT DETECTED
Opiates: NOT DETECTED
Tetrahydrocannabinol: NOT DETECTED

## 2021-12-03 LAB — URINALYSIS, ROUTINE W REFLEX MICROSCOPIC
Bilirubin Urine: NEGATIVE
Glucose, UA: NEGATIVE mg/dL
Hgb urine dipstick: NEGATIVE
Ketones, ur: NEGATIVE mg/dL
Leukocytes,Ua: NEGATIVE
Nitrite: NEGATIVE
Protein, ur: NEGATIVE mg/dL
Specific Gravity, Urine: 1.01 (ref 1.005–1.030)
pH: 7 (ref 5.0–8.0)

## 2021-12-03 LAB — ACETAMINOPHEN LEVEL: Acetaminophen (Tylenol), Serum: <10 ug/mL — ABNORMAL LOW (ref 10–30)

## 2021-12-03 LAB — COMPREHENSIVE METABOLIC PANEL
ALT: 24 U/L (ref 0–44)
AST: 27 U/L (ref 15–41)
Albumin: 4.5 g/dL (ref 3.5–5.0)
Alkaline Phosphatase: 75 U/L (ref 38–126)
Anion gap: 8 (ref 5–15)
BUN: 11 mg/dL (ref 6–20)
CO2: 29 mmol/L (ref 22–32)
Calcium: 9.8 mg/dL (ref 8.9–10.3)
Chloride: 105 mmol/L (ref 98–111)
Creatinine, Ser: 1.23 mg/dL — ABNORMAL HIGH (ref 0.44–1.00)
GFR, Estimated: 51 mL/min — ABNORMAL LOW (ref >60)
Glucose, Bld: 141 mg/dL — ABNORMAL HIGH (ref 70–99)
Potassium: 4.2 mmol/L (ref 3.5–5.1)
Sodium: 142 mmol/L (ref 135–145)
Total Bilirubin: 1.6 mg/dL — ABNORMAL HIGH (ref 0.3–1.2)
Total Protein: 6.9 g/dL (ref 6.5–8.1)

## 2021-12-03 LAB — TSH: TSH: 2.284 u[IU]/mL (ref 0.350–4.500)

## 2021-12-03 LAB — CBC
HCT: 42.3 % (ref 36.0–46.0)
Hemoglobin: 14 g/dL (ref 12.0–15.0)
MCH: 31.6 pg (ref 26.0–34.0)
MCHC: 33.1 g/dL (ref 30.0–36.0)
MCV: 95.5 fL (ref 80.0–100.0)
Platelets: 293 10*3/uL (ref 150–400)
RBC: 4.43 MIL/uL (ref 3.87–5.11)
RDW: 12.6 % (ref 11.5–15.5)
WBC: 7.4 10*3/uL (ref 4.0–10.5)
nRBC: 0 % (ref 0.0–0.2)

## 2021-12-03 LAB — RESP PANEL BY RT-PCR (FLU A&B, COVID) ARPGX2
Influenza A by PCR: NEGATIVE
Influenza B by PCR: NEGATIVE
SARS Coronavirus 2 by RT PCR: NEGATIVE

## 2021-12-03 LAB — SALICYLATE LEVEL: Salicylate Lvl: <7 mg/dL — ABNORMAL LOW (ref 7.0–30.0)

## 2021-12-03 LAB — ETHANOL: Alcohol, Ethyl (B): <10 mg/dL (ref <10)

## 2021-12-03 MED ORDER — CLONIDINE HCL 0.2 MG PO TABS
0.1000 mg | ORAL_TABLET | Freq: Two times a day (BID) | ORAL | Status: DC
  Administered 2021-12-03 – 2021-12-04 (×2): 0.1 mg via ORAL
  Filled 2021-12-03 (×2): qty 1

## 2021-12-03 MED ORDER — QUETIAPINE FUMARATE 200 MG PO TABS
300.0000 mg | ORAL_TABLET | Freq: Every day | ORAL | Status: DC
  Administered 2021-12-03: 300 mg via ORAL
  Filled 2021-12-03: qty 1

## 2021-12-03 MED ORDER — MELATONIN 5 MG PO TABS
5.0000 mg | ORAL_TABLET | Freq: Every day | ORAL | Status: DC
  Administered 2021-12-03: 5 mg via ORAL
  Filled 2021-12-03: qty 1

## 2021-12-03 MED ORDER — CARVEDILOL 12.5 MG PO TABS
6.2500 mg | ORAL_TABLET | Freq: Two times a day (BID) | ORAL | Status: DC
  Administered 2021-12-04: 6.25 mg via ORAL
  Filled 2021-12-03: qty 1

## 2021-12-03 MED ORDER — APIXABAN 5 MG PO TABS
5.0000 mg | ORAL_TABLET | Freq: Two times a day (BID) | ORAL | Status: DC
  Administered 2021-12-03 – 2021-12-04 (×2): 5 mg via ORAL
  Filled 2021-12-03 (×3): qty 1

## 2021-12-03 MED ORDER — ATORVASTATIN CALCIUM 80 MG PO TABS
80.0000 mg | ORAL_TABLET | Freq: Every day | ORAL | Status: DC
  Administered 2021-12-04: 80 mg via ORAL
  Filled 2021-12-03: qty 1

## 2021-12-03 MED ORDER — ISOSORBIDE DINITRATE 10 MG PO TABS
10.0000 mg | ORAL_TABLET | Freq: Three times a day (TID) | ORAL | Status: DC
  Administered 2021-12-03 – 2021-12-04 (×2): 10 mg via ORAL
  Filled 2021-12-03 (×4): qty 1

## 2021-12-03 MED ORDER — SACUBITRIL-VALSARTAN 24-26 MG PO TABS
1.0000 | ORAL_TABLET | Freq: Two times a day (BID) | ORAL | Status: DC
  Administered 2021-12-03 – 2021-12-04 (×2): 1 via ORAL
  Filled 2021-12-03 (×4): qty 1

## 2021-12-03 MED ORDER — ATORVASTATIN CALCIUM 80 MG PO TABS: 80.0000 mg | ORAL_TABLET | Freq: Every day | ORAL | Status: DC
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
## 2021-12-03 NOTE — ED Triage Notes (Signed)
Husband stated, she had a stroke in November and been doing good and the past week, she is talking about if she had a gun,  jumping off the bridge. Shes not eating, but sleeping since her Dr. Hanley Seamen her extra strength medication for sleep. I constantly having to keep a eye on her. She is not at all the normal. She is constantly jumping up and doing crazy stuff. Im not able to keep my eyes off her.  ?

## 2021-12-03 NOTE — ED Notes (Signed)
Patient on TTS 

## 2021-12-03 NOTE — ED Notes (Signed)
Dinner brought to room. ?

## 2021-12-03 NOTE — ED Provider Triage Note (Deleted)
Emergency Medicine Provider Triage Evaluation Note ? ?Dorothy Alvarez , a 56 y.o. female  was evaluated in triage.  Pt complains of  ? ?Review of Systems  ?Positive: Insomnia, ?Negative:  ? ?Physical Exam  ?BP 108/81 (BP Location: Right Arm)   Pulse (!) 105   Temp 98.4 ?F (36.9 ?C) (Oral)   Resp 16   SpO2 97%  ?Gen:   Awake, no distress   ?Resp:  Normal effort ?MSK:   Moves extremities without difficulty  ?Other:   ? ?Medical Decision Making  ?Medically screening exam initiated at 10:53 AM.  Appropriate orders placed.  Dorothy Alvarez was informed that the remainder of the evaluation will be completed by another provider, this initial triage assessment does not replace that evaluation, and the importance of remaining in the ED until their evaluation is complete. ? ? ?  ?Rhae Hammock, PA-C ?12/04/21 1756 ? ?

## 2021-12-03 NOTE — ED Notes (Addendum)
Patient's daughter in to visit. States patient's personal belongings were taken by the spouse. ?

## 2021-12-03 NOTE — ED Notes (Signed)
Patient ambulatory to room 49.  ?
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
## 2021-12-03 NOTE — ED Provider Notes (Addendum)
?[facility] ?Provider Note ? ? ?CSN: [id number] ?Arrival date & time: 12/03/21  1021 ? ?  ? ?History ? ?No chief complaint on file. ? ? ?Dorothy Alvarez is a 56 y.o. female. ? ?HPI ? ?56 year old female with medical history significant for morbid obesity, major depressive disorder, generalized anxiety disorder, bipolar 2 disorder, CHF, CVA, presenting to the emergency department with concern for suicidal ideation, worsening depression and concern for possible response to internal stimuli.  The history is provided by the patient's significant other who states that over the past week she has made statements such as "if I had a gun I would kill myself" and "I think I am just going to jump off a bridge."  He states that she has had symptoms of worsening depression since her diagnosis of stroke.  She had previously been on medications psychiatrically but he cannot remember what medications she was on.  She is not currently taking any psychiatric medications.  He also feels like she has been responding to internal stimuli, talking in the third person which she feels is new bizarre behavior for her.  Today patient denies any SI, HI, endorses worsening depressive symptoms, actively denies AVH. ? ?Home Medications ?Prior to Admission medications   ?Medication Sig Start Date End Date Taking? Authorizing Provider  ?apixaban (ELIQUIS) 5 MG TABS tablet Place 1 tablet (5 mg total) into feeding tube 2 (two) times daily. ?Patient taking differently: Take 5 mg by mouth 2 (two) times daily. 08/14/21  Yes Annita Brod, MD  ?atorvastatin (LIPITOR) 80 MG tablet Place 1 tablet (80 mg total) into feeding tube daily. ?Patient taking differently: Take 80 mg by mouth daily. 08/15/21  Yes Annita Brod, MD  ?carvedilol (COREG) 6.25 MG tablet Place 1 tablet (6.25 mg total) into feeding tube 2 (two) times daily with a meal. ?Patient taking differently: Take 6.25 mg by mouth 2 (two) times daily with a  meal. 08/14/21  Yes Annita Brod, MD  ?cloNIDine (CATAPRES) 0.1 MG tablet Place 1 tablet (0.1 mg total) into feeding tube 2 (two) times daily. ?Patient taking differently: Take 0.1 mg by mouth 2 (two) times daily. 08/14/21  Yes Annita Brod, MD  ?furosemide (LASIX) 80 MG tablet Take 1 tablet (80 mg total) by mouth daily. 05/04/20 07/31/22 Yes Florencia Reasons, MD  ?isosorbide dinitrate (ISORDIL) 10 MG tablet Place 1 tablet (10 mg total) into feeding tube 3 (three) times daily. ?Patient taking differently: Take 10 mg by mouth 3 (three) times daily. 08/14/21  Yes Annita Brod, MD  ?potassium chloride (KLOR-CON) 20 MEQ packet Place 20 mEq into feeding tube 2 (two) times daily. ?Patient taking differently: Take 20 mEq by mouth 2 (two) times daily. 08/14/21  Yes Annita Brod, MD  ?QUEtiapine (SEROQUEL) 300 MG tablet Take 300 mg by mouth at bedtime. 10/02/21  Yes [provider]  ?sacubitril-valsartan (ENTRESTO) 24-26 MG Take 1 tablet by mouth 2 (two) times daily. 08/14/21  Yes Annita Brod, MD  ?acetaminophen (TYLENOL) 325 MG tablet Place 2 tablets (650 mg total) into feeding tube every 4 (four) hours as needed for mild pain (or temp > 37.5 C (99.5 F)). ?Patient not taking: Reported on 12/03/2021 08/14/21   Annita Brod, MD  ?methylphenidate (RITALIN) 5 MG tablet Place 1 tablet (5 mg total) into feeding tube 2 (two) times daily with breakfast and lunch. ?Patient not taking: Reported on 12/03/2021 08/14/21   Annita Brod, MD  ?Nutritional Supplements (FEEDING  SUPPLEMENT, OSMOLITE 1.5 CAL,) LIQD Place 355 mLs into feeding tube 4 (four) times daily. ?Patient not taking: Reported on 12/03/2021 08/14/21   Annita Brod, MD  ?   ? ?Allergies    ?Ketorolac, Morphine and related, Nsaids, and Trazodone and nefazodone   ? ?Review of Systems   ?Review of Systems  ?All other systems reviewed and are negative. ? ?Physical Exam ?Updated Vital Signs ?BP 120/74 (BP Location: Left Arm)   Pulse  77   Temp 98.4 ?F (36.9 ?C) (Oral)   Resp 18   SpO2 100%  ?Physical Exam ?Vitals and nursing note reviewed.  ?Constitutional:   ?   General: She is not in acute distress. ?HENT:  ?   Head: Normocephalic and atraumatic.  ?Eyes:  ?   Conjunctiva/sclera: Conjunctivae normal.  ?   Pupils: Pupils are equal, round, and reactive to light.  ?Cardiovascular:  ?   Rate and Rhythm: Normal rate and regular rhythm.  ?Pulmonary:  ?   Effort: Pulmonary effort is normal. No respiratory distress.  ?Abdominal:  ?   General: There is no distension.  ?   Tenderness: There is no guarding.  ?Musculoskeletal:     ?   General: No deformity or signs of injury.  ?   Cervical back: Neck supple.  ?Skin: ?   Findings: No lesion or rash.  ?Neurological:  ?   General: No focal deficit present.  ?   Mental Status: She is alert. Mental status is at baseline.  ?Psychiatric:     ?   Attention and Perception: She perceives auditory hallucinations.     ?   Mood and Affect: Mood is depressed.     ?   Speech: Speech normal.     ?   Behavior: Behavior normal. Behavior is cooperative.     ?   Thought Content: Thought content normal.  ? ? ?ED Results / Procedures / Treatments   ?Labs ?(all labs ordered are listed, but only abnormal results are displayed) ?Labs Reviewed  ?COMPREHENSIVE METABOLIC PANEL - Abnormal; Notable for the following components:  ?    Result Value  ? Glucose, Bld 141 (*)   ? Creatinine, Ser 1.23 (*)   ? Total Bilirubin 1.6 (*)   ? GFR, Estimated 51 (*)   ? All other components within normal limits  ?SALICYLATE LEVEL - Abnormal; Notable for the following components:  ? Salicylate Lvl <1.6 (*)   ? All other components within normal limits  ?ACETAMINOPHEN LEVEL - Abnormal; Notable for the following components:  ? Acetaminophen (Tylenol), Serum <10 (*)   ? All other components within normal limits  ?RESP PANEL BY RT-PCR (FLU A&B, COVID) ARPGX2  ?ETHANOL  ?CBC  ?RAPID URINE DRUG SCREEN, HOSP PERFORMED  ?TSH  ?URINALYSIS, ROUTINE W REFLEX  MICROSCOPIC  ?T4  ? ? ?EKG ?None ? ?Radiology ?CT Head Wo Contrast ? ?Result Date: 12/03/2021 ?CLINICAL DATA:  Hallucinations EXAM: CT HEAD WITHOUT CONTRAST TECHNIQUE: Contiguous axial images were obtained from the base of the skull through the vertex without intravenous contrast. RADIATION DOSE REDUCTION: This exam was performed according to the departmental dose-optimization program which includes automated exposure control, adjustment of the mA and/or kV according to patient size and/or use of iterative reconstruction technique. COMPARISON:  08/06/2021 FINDINGS: Brain: No evidence of acute infarction, hemorrhage, extra-axial collection, ventriculomegaly, or mass effect. 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 Generalized cerebral atrophy. Periventricular white matter low attenuation likely secondary to microangiopathy. Vascular: Cerebrovascular atherosclerotic calcifications are  noted. Skull: Negative for fracture or focal lesion. Sinuses/Orbits: Visualized portions of the orbits are unremarkable. Visualized portions of the paranasal sinuses are unremarkable. Visualized portions of the mastoid air cells are unremarkable. Other: None. IMPRESSION: 1. No acute intracranial findings. 2. 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 3. Chronic small vessel ischemic changes. Electronically Signed   By: Kathreen Devoid M.D.   On: 12/03/2021 13:02   ? ?Procedures ?Procedures  ? ? ?Medications Ordered in ED ?Medications - No data to display ? ?ED Course/ Medical Decision Making/ A&P ?  ?                        ?Medical Decision Making ?Amount and/or Complexity of Data Reviewed ?Labs: ordered. ? ?Risk ?OTC drugs. ?Prescription drug management. ? ? ?56 year old female with medical history significant for morbid obesity, major depressive disorder, generalized anxiety disorder, bipolar 2 disorder, CHF, CVA, presenting to the emergency department with concern for suicidal ideation, worsening depression  and concern for possible response to internal stimuli.  The history is provided by the patient's significant other who states that over the past week she has made statements such as "if I had a gun I would

## 2021-12-03 NOTE — BH Assessment (Signed)
@   1600 H& P incomplete and labs pending.  Patient is not medically cleared for TTS assessment.  ? ?

## 2021-12-04 ENCOUNTER — Other Ambulatory Visit: Payer: Self-pay

## 2021-12-04 ENCOUNTER — Inpatient Hospital Stay (HOSPITAL_COMMUNITY)
Admission: AD | Admit: 2021-12-04 | Discharge: 2021-12-09 | DRG: 885 | Disposition: A | Discharge status: Home / Self Care | Payer: Medicare HMO | Source: Intra-hospital | Attending: Psychiatry | Admitting: Psychiatry

## 2021-12-04 ENCOUNTER — Encounter (HOSPITAL_COMMUNITY): Payer: Self-pay | Admitting: Registered Nurse

## 2021-12-04 ENCOUNTER — Inpatient Hospital Stay (HOSPITAL_COMMUNITY): Payer: Medicare HMO

## 2021-12-04 DIAGNOSIS — F01518 Vascular dementia, unspecified severity, with other behavioral disturbance: Secondary | ICD-10-CM | POA: Diagnosis present

## 2021-12-04 DIAGNOSIS — R200 Anesthesia of skin: Principal | ICD-10-CM

## 2021-12-04 DIAGNOSIS — F411 Generalized anxiety disorder: Secondary | ICD-10-CM | POA: Diagnosis present

## 2021-12-04 DIAGNOSIS — G4700 Insomnia, unspecified: Secondary | ICD-10-CM | POA: Diagnosis present

## 2021-12-04 DIAGNOSIS — F0150 Vascular dementia without behavioral disturbance: Secondary | ICD-10-CM

## 2021-12-04 DIAGNOSIS — Z8673 Personal history of transient ischemic attack (TIA), and cerebral infarction without residual deficits: Secondary | ICD-10-CM | POA: Diagnosis not present

## 2021-12-04 DIAGNOSIS — Z635 Disruption of family by separation and divorce: Secondary | ICD-10-CM

## 2021-12-04 DIAGNOSIS — Z885 Allergy status to narcotic agent status: Secondary | ICD-10-CM | POA: Diagnosis not present

## 2021-12-04 DIAGNOSIS — Z888 Allergy status to other drugs, medicaments and biological substances status: Secondary | ICD-10-CM

## 2021-12-04 DIAGNOSIS — F0153 Vascular dementia, unspecified severity, with mood disturbance: Secondary | ICD-10-CM | POA: Diagnosis present

## 2021-12-04 DIAGNOSIS — F1310 Sedative, hypnotic or anxiolytic abuse, uncomplicated: Secondary | ICD-10-CM | POA: Diagnosis present

## 2021-12-04 DIAGNOSIS — W07XXXA Fall from chair, initial encounter: Secondary | ICD-10-CM | POA: Diagnosis not present

## 2021-12-04 DIAGNOSIS — Y92230 Patient room in hospital as the place of occurrence of the external cause: Secondary | ICD-10-CM | POA: Diagnosis not present

## 2021-12-04 DIAGNOSIS — Z7901 Long term (current) use of anticoagulants: Secondary | ICD-10-CM | POA: Diagnosis not present

## 2021-12-04 DIAGNOSIS — F0154 Vascular dementia, unspecified severity, with anxiety: Secondary | ICD-10-CM | POA: Diagnosis present

## 2021-12-04 DIAGNOSIS — Z818 Family history of other mental and behavioral disorders: Secondary | ICD-10-CM | POA: Diagnosis not present

## 2021-12-04 DIAGNOSIS — Z8249 Family history of ischemic heart disease and other diseases of the circulatory system: Secondary | ICD-10-CM | POA: Diagnosis not present

## 2021-12-04 DIAGNOSIS — I110 Hypertensive heart disease with heart failure: Secondary | ICD-10-CM | POA: Diagnosis present

## 2021-12-04 DIAGNOSIS — Z886 Allergy status to analgesic agent status: Secondary | ICD-10-CM

## 2021-12-04 DIAGNOSIS — Z90710 Acquired absence of both cervix and uterus: Secondary | ICD-10-CM

## 2021-12-04 DIAGNOSIS — Z79899 Other long term (current) drug therapy: Secondary | ICD-10-CM | POA: Diagnosis not present

## 2021-12-04 DIAGNOSIS — F332 Major depressive disorder, recurrent severe without psychotic features: Secondary | ICD-10-CM | POA: Diagnosis present

## 2021-12-04 DIAGNOSIS — R159 Full incontinence of feces: Secondary | ICD-10-CM | POA: Diagnosis not present

## 2021-12-04 DIAGNOSIS — F329 Major depressive disorder, single episode, unspecified: Secondary | ICD-10-CM | POA: Diagnosis not present

## 2021-12-04 DIAGNOSIS — W19XXXA Unspecified fall, initial encounter: Secondary | ICD-10-CM

## 2021-12-04 LAB — GLUCOSE, CAPILLARY: Glucose-Capillary: 131 mg/dL — ABNORMAL HIGH (ref 70–99)

## 2021-12-04 LAB — T4: T4, Total: 8.1 ug/dL (ref 4.5–12.0)

## 2021-12-04 MED ORDER — ATORVASTATIN CALCIUM 80 MG PO TABS
80.0000 mg | ORAL_TABLET | Freq: Every day | ORAL | Status: DC
  Administered 2021-12-05 – 2021-12-09 (×5): 80 mg via ORAL
  Filled 2021-12-04 (×7): qty 1

## 2021-12-04 MED ORDER — CARVEDILOL 3.125 MG PO TABS
6.2500 mg | ORAL_TABLET | Freq: Two times a day (BID) | ORAL | Status: DC
  Administered 2021-12-05 – 2021-12-09 (×9): 6.25 mg via ORAL
  Filled 2021-12-04 (×14): qty 1

## 2021-12-04 MED ORDER — ISOSORBIDE DINITRATE 10 MG PO TABS
10.0000 mg | ORAL_TABLET | Freq: Three times a day (TID) | ORAL | Status: DC
  Administered 2021-12-05 – 2021-12-09 (×13): 10 mg via ORAL
  Filled 2021-12-04 (×20): qty 1

## 2021-12-04 MED ORDER — ALUM & MAG HYDROXIDE-SIMETH 200-200-20 MG/5ML PO SUSP
30.0000 mL | ORAL | Status: DC | PRN
Start: 2021-12-04 — End: 2021-12-09

## 2021-12-04 MED ORDER — LORAZEPAM 2 MG/ML IJ SOLN
1.0000 mg | Freq: Once | INTRAMUSCULAR | Status: AC | PRN
  Administered 2021-12-04: 1 mg via INTRAVENOUS
  Filled 2021-12-04: qty 1

## 2021-12-04 MED ORDER — SACUBITRIL-VALSARTAN 24-26 MG PO TABS
1.0000 | ORAL_TABLET | Freq: Two times a day (BID) | ORAL | Status: DC
  Administered 2021-12-05 – 2021-12-09 (×9): 1 via ORAL
  Filled 2021-12-04 (×14): qty 1

## 2021-12-04 MED ORDER — MAGNESIUM HYDROXIDE 400 MG/5ML PO SUSP: 30.0000 mL | Freq: Every day | ORAL | Status: DC | PRN

## 2021-12-04 MED ORDER — ACETAMINOPHEN 325 MG PO TABS: 650.0000 mg | ORAL_TABLET | Freq: Four times a day (QID) | ORAL | Status: DC | PRN

## 2021-12-04 MED ORDER — QUETIAPINE FUMARATE 300 MG PO TABS
300.0000 mg | ORAL_TABLET | Freq: Every day | ORAL | Status: DC
  Administered 2021-12-05: 300 mg via ORAL
  Filled 2021-12-04 (×4): qty 1

## 2021-12-04 MED ORDER — HALOPERIDOL LACTATE 5 MG/ML IJ SOLN
5.0000 mg | Freq: Once | INTRAMUSCULAR | Status: AC
  Administered 2021-12-04: 5 mg via INTRAVENOUS
  Filled 2021-12-04: qty 1

## 2021-12-04 MED ORDER — APIXABAN 5 MG PO TABS
5.0000 mg | ORAL_TABLET | Freq: Two times a day (BID) | ORAL | Status: DC
  Administered 2021-12-05 – 2021-12-09 (×10): 5 mg via ORAL
  Filled 2021-12-04 (×14): qty 1

## 2021-12-04 MED ORDER — MELATONIN 5 MG PO TABS
5.0000 mg | ORAL_TABLET | Freq: Every day | ORAL | Status: DC
  Administered 2021-12-05 – 2021-12-08 (×5): 5 mg via ORAL
  Filled 2021-12-04 (×8): qty 1

## 2021-12-04 MED ORDER — CLONIDINE HCL 0.1 MG PO TABS
0.1000 mg | ORAL_TABLET | Freq: Two times a day (BID) | ORAL | Status: DC
  Administered 2021-12-05 – 2021-12-09 (×9): 0.1 mg via ORAL
  Filled 2021-12-04 (×15): qty 1

## 2021-12-04 NOTE — Group Note (Signed)
LCSW Group Therapy Note ? ? ?Group Date: 12/04/2021 ?Start Time: 1300 ?End Time: 1400 ? ?Type of Therapy and Topic:  Group Therapy:  Self-Esteem ?  ?Participation Level:  Did Not Attend ? ?Description of Group: ?This group addressed positive self-esteem. Patients were given a worksheet with a blank shield. Patients were asked what a shield is and when it is used. Patients were asked to list, draw, or write protective factors in the their lives on their shields. Patients discussed the words, ideas and drawings that they put on their shield. Patients were encouraged to have a daily reflection of positive characteristics/ protective factors. ? ?Therapeutic Goals ?Patient will verbalize two of their positive qualities ?Patient will demonstrate insight but naming social supports in their lives ?Patient will verbalize their feelings when voicing positive self affirmations and when voicing positive affirmations of others ?Patients will discuss the potential positive impact on their wellness/recovery of focusing on positive traits of self and others. ? ?Summary of Patient Progress: Did not attend  ? ?Darleen Crocker, LCSWA ?12/04/2021  3:09 PM   ? ?

## 2021-12-04 NOTE — Progress Notes (Addendum)
Patient is a 56 year old female who presented voluntarily from [facility] for worsening depression and SI. Pt reportedly made suicidal statements to her husband, and has had worsening depression since her stroke. Pt denied being suicidal, stating, "I wasn't going to do anything." Pt endorsed feelings of hopelessness due to not being able to do some of the things that she used to do, e.g., not being able to drive, struggling with reading and writing. Pt presented with some memory issues asking the same question multiple times during assessment. Pt denied alcohol/ drug use. Pt denied SI/HI and A/VH. Pt presented with a depressed affect/mood- exhibited calm, cooperative behavior during admission interview and assessment. VS monitored and recorded. Skin check performed. Pt had no belongings to secure. Patient was oriented to unit and schedule. PO fluids provided.Q 15 min checks initiated for safety.  ?

## 2021-12-04 NOTE — ED Notes (Signed)
Patient ambulated to br and back without difficulty ?

## 2021-12-04 NOTE — ED Triage Notes (Signed)
Pt BIB [facility] from [facility]. Patient c/o left side tingling. Patient states this has been going on since her last stroke. ?

## 2021-12-04 NOTE — Progress Notes (Signed)
[facility] LCSW Progress Note ?  ?12/04/2021    11:30 AM ? ?Jennaya Pogue  ? ?[id number]  ? ?Type of Contact and Topic:  Psychiatric Bed Placement  ? ?Pt accepted to [facility] 302-02    ? ?Patient meets inpatient criteria per Lindon Romp, NP  ? ?The attending provider will be Dr. Berdine Addison  ? ?Call report to [phone]   ? ?Gordan Payment, RN @ [facility] ED notified.    ? ?Pt scheduled  to arrive at [facility] @ 1130.  ? ?Mariea Clonts, MSW, LCSW-A  ?11:33 AM 12/04/2021   ?  ? ?  ?  ? ? ? ? ?  ?

## 2021-12-04 NOTE — ED Notes (Signed)
Report called to [facility] and [facility] called ?

## 2021-12-04 NOTE — ED Notes (Signed)
Called MRI to let them know the patient had been medicated per MD orders and would be ready for MRI in 15 min ?

## 2021-12-04 NOTE — ED Provider Notes (Signed)
?[facility] DEPT ?Provider Note ? ? ?CSN: [id number] ?Arrival date & time: 12/04/21  1720 ? ?  ? ?History ? ?Chief Complaint  ?Patient presents with  ? MDD  ? ? ?Dorothy Alvarez is a 56 y.o. female history of schizophrenia here presenting with L arm numbness. Patient was seen here yesterday.  Patient had left arm numbness after previous stroke.  Patient had an unremarkable CT head.  She was then [facility].  Patient told them that she is having a stroke today.  Patient was complaining of left arm numbness that got worsened.  Patient appears to be very confused and very difficult to get a consistent history.  Due to concern for possible stroke, patient was sent back to the ER for further evaluation ? ?The history is provided by the patient.  ? ?  ? ?Home Medications ?Prior to Admission medications   ?Medication Sig Start Date End Date Taking? Authorizing Provider  ?acetaminophen (TYLENOL) 325 MG tablet Place 2 tablets (650 mg total) into feeding tube every 4 (four) hours as needed for mild pain (or temp > 37.5 C (99.5 F)). ?Patient not taking: Reported on 12/03/2021 08/14/21   Annita Brod, MD  ?apixaban (ELIQUIS) 5 MG TABS tablet Place 1 tablet (5 mg total) into feeding tube 2 (two) times daily. ?Patient taking differently: Take 5 mg by mouth 2 (two) times daily. 08/14/21   Annita Brod, MD  ?atorvastatin (LIPITOR) 80 MG tablet Place 1 tablet (80 mg total) into feeding tube daily. ?Patient taking differently: Take 80 mg by mouth daily. 08/15/21   Annita Brod, MD  ?carvedilol (COREG) 6.25 MG tablet Place 1 tablet (6.25 mg total) into feeding tube 2 (two) times daily with a meal. ?Patient taking differently: Take 6.25 mg by mouth 2 (two) times daily with a meal. 08/14/21   Annita Brod, MD  ?cloNIDine (CATAPRES) 0.1 MG tablet Place 1 tablet (0.1 mg total) into feeding tube 2 (two) times daily. ?Patient taking differently: Take 0.1 mg by mouth  2 (two) times daily. 08/14/21   Annita Brod, MD  ?furosemide (LASIX) 80 MG tablet Take 1 tablet (80 mg total) by mouth daily. 05/04/20 07/31/22  Florencia Reasons, MD  ?isosorbide dinitrate (ISORDIL) 10 MG tablet Place 1 tablet (10 mg total) into feeding tube 3 (three) times daily. ?Patient taking differently: Take 10 mg by mouth 3 (three) times daily. 08/14/21   Annita Brod, MD  ?methylphenidate (RITALIN) 5 MG tablet Place 1 tablet (5 mg total) into feeding tube 2 (two) times daily with breakfast and lunch. ?Patient not taking: Reported on 12/03/2021 08/14/21   Annita Brod, MD  ?Nutritional Supplements (FEEDING SUPPLEMENT, OSMOLITE 1.5 CAL,) LIQD Place 355 mLs into feeding tube 4 (four) times daily. ?Patient not taking: Reported on 12/03/2021 08/14/21   Annita Brod, MD  ?potassium chloride (KLOR-CON) 20 MEQ packet Place 20 mEq into feeding tube 2 (two) times daily. ?Patient taking differently: Take 20 mEq by mouth 2 (two) times daily. 08/14/21   Annita Brod, MD  ?QUEtiapine (SEROQUEL) 300 MG tablet Take 300 mg by mouth at bedtime. 10/02/21   [provider]  ?sacubitril-valsartan (ENTRESTO) 24-26 MG Take 1 tablet by mouth 2 (two) times daily. 08/14/21   Annita Brod, MD  ?   ? ?Allergies    ?Ketorolac, Morphine and related, Nsaids, and Trazodone and nefazodone   ? ?Review of Systems   ?Review of Systems  ?Neurological:  Positive for numbness.  ?All other systems reviewed and are negative. ? ?Physical Exam ?Updated Vital Signs ?BP 133/87 (BP Location: Left Arm)   Pulse 95   Temp 98.2 ?F (36.8 ?C) (Oral)   Resp 16   Ht '5\' 6"'$  (1.676 m)   Wt 85.9 kg   SpO2 100%   BMI 30.57 kg/m?  ?Physical Exam ?Vitals and nursing note reviewed.  ?Constitutional:   ?   Appearance: Normal appearance.  ?HENT:  ?   Head: Normocephalic.  ?   Nose: Nose normal.  ?   Mouth/Throat:  ?   Mouth: Mucous membranes are moist.  ?Eyes:  ?   Extraocular Movements: Extraocular movements intact.  ?   Pupils:  Pupils are equal, round, and reactive to light.  ?Cardiovascular:  ?   Rate and Rhythm: Normal rate and regular rhythm.  ?   Pulses: Normal pulses.  ?   Heart sounds: Normal heart sounds.  ?Pulmonary:  ?   Effort: Pulmonary effort is normal.  ?   Breath sounds: Normal breath sounds.  ?Abdominal:  ?   General: Abdomen is flat.  ?   Palpations: Abdomen is soft.  ?Musculoskeletal:     ?   General: Normal range of motion.  ?   Cervical back: Normal range of motion.  ?Skin: ?   General: Skin is warm.  ?Neurological:  ?   Mental Status: She is alert.  ?   Comments: Decreased sensation to the left face and left arm.  Patient has normal strength in the arms and legs.  Patient is able to ambulate by herself  ?Psychiatric:  ?   Comments: Responding to internal stimuli, poor judgment  ? ? ?ED Results / Procedures / Treatments   ?Labs ?(all labs ordered are listed, but only abnormal results are displayed) ?Labs Reviewed  ?GLUCOSE, CAPILLARY - Abnormal; Notable for the following components:  ?    Result Value  ? Glucose-Capillary 131 (*)   ? All other components within normal limits  ? ? ?EKG ?None ? ?Radiology ?CT Head Wo Contrast ? ?Result Date: 12/03/2021 ?CLINICAL DATA:  Hallucinations EXAM: CT HEAD WITHOUT CONTRAST TECHNIQUE: Contiguous axial images were obtained from the base of the skull through the vertex without intravenous contrast. RADIATION DOSE REDUCTION: This exam was performed according to the departmental dose-optimization program which includes automated exposure control, adjustment of the mA and/or kV according to patient size and/or use of iterative reconstruction technique. COMPARISON:  08/06/2021 FINDINGS: Brain: No evidence of acute infarction, hemorrhage, extra-axial collection, ventriculomegaly, or mass effect. 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 Generalized cerebral atrophy. Periventricular white matter low attenuation likely secondary to microangiopathy. Vascular: Cerebrovascular  atherosclerotic calcifications are noted. Skull: Negative for fracture or focal lesion. Sinuses/Orbits: Visualized portions of the orbits are unremarkable. Visualized portions of the paranasal sinuses are unremarkable. Visualized portions of the mastoid air cells are unremarkable. Other: None. IMPRESSION: 1. No acute intracranial findings. 2. 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 3. Chronic small vessel ischemic changes. Electronically Signed   By: Kathreen Devoid M.D.   On: 12/03/2021 13:02   ? ?Procedures ?Procedures  ? ? ?Medications Ordered in ED ?Medications  ?apixaban (ELIQUIS) tablet 5 mg (has no administration in time range)  ?atorvastatin (LIPITOR) tablet 80 mg (has no administration in time range)  ?carvedilol (COREG) tablet 6.25 mg (has no administration in time range)  ?cloNIDine (CATAPRES) tablet 0.1 mg (has no administration in time range)  ?isosorbide dinitrate (ISORDIL) tablet 10 mg (  has no administration in time range)  ?melatonin tablet 5 mg (has no administration in time range)  ?QUEtiapine (SEROQUEL) tablet 300 mg (has no administration in time range)  ?sacubitril-valsartan (ENTRESTO) 24-26 mg per tablet (has no administration in time range)  ?acetaminophen (TYLENOL) tablet 650 mg (has no administration in time range)  ?alum & mag hydroxide-simeth (MAALOX/MYLANTA) 200-200-20 MG/5ML suspension 30 mL (has no administration in time range)  ?magnesium hydroxide (MILK OF MAGNESIA) suspension 30 mL (has no administration in time range)  ?LORazepam (ATIVAN) injection 1 mg (1 mg Intravenous Given 12/04/21 2034)  ?haloperidol lactate (HALDOL) injection 5 mg (5 mg Intravenous Given 12/04/21 2033)  ? ? ?ED Course/ Medical Decision Making/ A&P ?  ?                        ?Medical Decision Making ?Abie Killian is a 56 y.o. female here presenting with left arm numbness and left facial numbness.  She states that this is chronic has not changed but apparently got worse today.  She had unremarkable CT  head yesterday.  Reviewed labs from yesterday and she was medically cleared then.  We will get an MRI brain to make sure she does not have a small stroke.  ?  ?11:04 PM ?MRI brain showed no stroke. Will transf

## 2021-12-04 NOTE — ED Notes (Signed)
Faxed Voluntary Admission and Consent for treatment signed and Faxed to [facility] ?

## 2021-12-04 NOTE — Progress Notes (Signed)
Patient arrived on unit in the late afternoon/early evening. She was at nurses station and approached this Probation officer in the milieu. She complained at that time of having numbness in her face, neck, left arm since her stroke. Pt appeared to have ?cognitive impairment. She states that she had "been like that" for an unknown amount of time, and could not quantify when the stroke was, but states that she had "a couple" of them. She did not appear to have ambulatory impairment, face symmetrical, vocalizing okay. Didn't appear to be having an acute episode, so I took a minute to look at the chart to get an idea what was going on as this patient was a new arrival and I was completely unfamiliar.  ? ?Unfortunately, there was a few distractions and disturbances, and within a few minutes there was an emergent event called for this patient.  ? ?I arrived to the scene and patient was on the floor in 300 hall near the phone (530 PM approximately), vocalizing moaning sound, lying prone, mildly trembling all limbs, eyes half open. She was minimally responsive to requests. Vitals recorded per RN, FSBG taken, EMS called. Nicholes Rough NP, RN, MHTs present. After a few minutes she was vocalizing louder, complaining of left arm pain and numbness, holding it out and started to kick out with her legs and turn self around on the floor. Ambulance arrived shortly thereafter. With assists and lift, patient was eventually taken to stretcher.  ? ? ?

## 2021-12-04 NOTE — ED Notes (Signed)
Patient ambulated to br and back without difficulty  ?

## 2021-12-04 NOTE — Progress Notes (Signed)
Patient approached staff stating "I'm having a stroke", complained of left arm "tingling", then proceeded to drop to the ground, being caught by staff near by. EMS called, MD notified. VS obtained : BP 153/102 HR 96 O2 100% CBG 131.Marland Kitchen  ?

## 2021-12-05 DIAGNOSIS — F0150 Vascular dementia without behavioral disturbance: Secondary | ICD-10-CM

## 2021-12-05 LAB — VITAMIN B12: Vitamin B-12: 317 pg/mL (ref 180–914)

## 2021-12-05 LAB — VITAMIN D 25 HYDROXY (VIT D DEFICIENCY, FRACTURES): Vit D, 25-Hydroxy: 36.54 ng/mL (ref 30–100)

## 2021-12-05 LAB — FOLATE: Folate: 10.7 ng/mL (ref >5.9)

## 2021-12-05 MED ORDER — RISPERIDONE 0.5 MG PO TABS
0.5000 mg | ORAL_TABLET | Freq: Three times a day (TID) | ORAL | Status: DC | PRN
  Administered 2021-12-06 – 2021-12-07 (×2): 0.5 mg via ORAL
  Filled 2021-12-05 (×2): qty 1

## 2021-12-05 MED ORDER — SERTRALINE HCL 25 MG PO TABS
25.0000 mg | ORAL_TABLET | Freq: Every day | ORAL | Status: AC
  Administered 2021-12-05: 25 mg via ORAL
  Filled 2021-12-05 (×2): qty 1

## 2021-12-05 MED ORDER — RISPERIDONE 0.5 MG PO TBDP
0.5000 mg | ORAL_TABLET | Freq: Every day | ORAL | Status: DC
  Administered 2021-12-05 – 2021-12-08 (×4): 0.5 mg via ORAL
  Filled 2021-12-05 (×5): qty 1

## 2021-12-05 MED ORDER — SERTRALINE HCL 50 MG PO TABS
50.0000 mg | ORAL_TABLET | Freq: Every day | ORAL | Status: DC
  Administered 2021-12-06 – 2021-12-09 (×4): 50 mg via ORAL
  Filled 2021-12-05 (×5): qty 1

## 2021-12-05 MED ORDER — QUETIAPINE FUMARATE 50 MG PO TABS: 50.0000 mg | ORAL_TABLET | Freq: Every evening | ORAL | Status: DC | PRN

## 2021-12-05 MED ORDER — RIVASTIGMINE TARTRATE 1.5 MG PO CAPS
1.5000 mg | ORAL_CAPSULE | Freq: Two times a day (BID) | ORAL | Status: DC
  Filled 2021-12-05 (×4): qty 1

## 2021-12-05 MED ORDER — ENSURE ENLIVE PO LIQD
237.0000 mL | Freq: Two times a day (BID) | ORAL | Status: DC
  Administered 2021-12-05 – 2021-12-08 (×7): 237 mL via ORAL
  Filled 2021-12-05 (×11): qty 237

## 2021-12-05 MED ORDER — POTASSIUM CHLORIDE CRYS ER 20 MEQ PO TBCR
20.0000 meq | EXTENDED_RELEASE_TABLET | Freq: Two times a day (BID) | ORAL | Status: DC
  Administered 2021-12-05 – 2021-12-09 (×8): 20 meq via ORAL
  Filled 2021-12-05 (×10): qty 1

## 2021-12-05 MED ORDER — OLANZAPINE 10 MG IM SOLR: 5.0000 mg | Freq: Three times a day (TID) | INTRAMUSCULAR | Status: DC | PRN

## 2021-12-05 MED ORDER — FUROSEMIDE 40 MG PO TABS
80.0000 mg | ORAL_TABLET | Freq: Every day | ORAL | Status: DC
  Administered 2021-12-06 – 2021-12-09 (×4): 80 mg via ORAL
  Filled 2021-12-05 (×5): qty 1

## 2021-12-05 MED ORDER — QUETIAPINE FUMARATE 200 MG PO TABS: 200.0000 mg | ORAL_TABLET | Freq: Every day | ORAL | Status: DC

## 2021-12-05 MED ORDER — RISPERIDONE 0.5 MG PO TABS: 0.5000 mg | ORAL_TABLET | Freq: Every day | ORAL | Status: DC

## 2021-12-05 NOTE — [facility] Group Notes (Signed)
Pt attended group and participated in discussion. 

## 2021-12-05 NOTE — Progress Notes (Signed)
Pt observed in dayroom asking myself and her peer patients questions repeatedly. Pt asks questions as if it is the first time she has asked. "What show is this?" "Did I take my medicine?" "What time does this show go off?" Only two to five minutes will have passed when she repeats the question.  ?

## 2021-12-05 NOTE — Progress Notes (Signed)
Patient has been having difficult in remembering her room, She kept pacing in and out of the dayroom could not concentrate on watching the move. Would come out asking if she can watch the movie. She kept asking where her room was. Patient was reoriented and write walked several times on the hall way with Patient to show her room; a large  white name tag was put on Patient door. After medication Patient kept coming back a few minutes after taking medications asking if she had taken her medication. Dorothy Alvarez has been having lots of repetitive statements " did I take my medication, did I sleep last night, do I have any sleeping medication, can I watch the movie" ? ?Patient had a bowel incontinency episode in the dayroom and soiled clothes and walked the hallway with feces on the floor and did not realize it. Patient was assisted to take a shower.  ? ?Simple answers and directions were provided to Patient. Support and encouragement given. Patient denies SI/HI/A/VH. Patient remains safe.   ?

## 2021-12-05 NOTE — [facility] Suicide Risk Assessment (Signed)
[facility] INPATIENT:  Family/Significant Other Suicide Prevention Education ? ?Suicide Prevention Education:  ?Education Completed; Dorothy Alvarez, husband, [phone]    (name of family member/significant other) has been identified by the patient as the family member/significant other with whom the patient will be residing, and identified as the person(s) who will aid the patient in the event of a mental health crisis (suicidal ideations/suicide attempt).  With written consent from the patient, the family member/significant other has been provided the following suicide prevention education, prior to the and/or following the discharge of the patient. ? ?CSW spoke with patient husband who reports that for the past 2 weeks patient has been not sleeping, eating, and talking about things in the 3rd person.  She has been getting up at 3am and wandering.  Husband reports no additional safety concerns and patient never speaks about hurting anyone.  No guns/weapons.  ? ?The suicide prevention education provided includes the following: ?Suicide risk factors ?Suicide prevention and interventions ?National Suicide Hotline telephone number ?[facility] assessment telephone number ?[facility] Emergency Assistance 911 ?[place] and/or Residential Mobile Crisis Unit telephone number ? ?Request made of family/significant other to: ?Remove weapons (e.g., guns, rifles, knives), all items previously/currently identified as safety concern.   ?Remove drugs/medications (over-the-counter, prescriptions, illicit drugs), all items previously/currently identified as a safety concern. ? ?The family member/significant other verbalizes understanding of the suicide prevention education information provided.  The family member/significant other agrees to remove the items of safety concern listed above. ? ?Dorothy Alvarez ?12/05/2021, 3:42 PM ?

## 2021-12-05 NOTE — Progress Notes (Signed)
Patient returned from [facility] ambulating with steady gait. Alert and oriented x1 reorient to time and place. Patient Denies SI/HI/A/VH appears to be responding to internal stimuli. C/O of minimal tingling in her left arm and  left leg. Stated " I feel much better now than I was before I went to the hospital, I had a stroke before and ever since I feel tingling in my arm" Patient has active range of motion.  ? ?Emotional support and availability offered to Patient as needed. Skin assessment done and searched per protocol.  Fluids and Food offered, tolerated well. Q15 minutes safety checks initiated without self harm gestures.  Patient called Husband Per Husband request. ? ? ? ? ?
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
## 2021-12-05 NOTE — Plan of Care (Signed)
?  Problem: Activity: ?Goal: Interest or engagement in activities will improve ?Outcome: Progressing ?Goal: Sleeping patterns will improve ?Outcome: Progressing ?  ?Problem: Coping: ?Goal: Ability to verbalize frustrations and anger appropriately will improve ?Outcome: Progressing ?Goal: Ability to demonstrate self-control will improve ?Outcome: Progressing ?  ?Problem: Safety: ?Goal: Periods of time without injury will increase ?Outcome: Progressing ?  ?

## 2021-12-05 NOTE — Progress Notes (Signed)
Received report from ED RN, Patient has been discharged waiting on transportation to [facility], Alert and oriented MRI done and no stroke finding.  ?

## 2021-12-05 NOTE — Progress Notes (Signed)
Psychoeducational Group Note ? ?Date:  12/05/2021 ?Time:  2015 ? ?Group Topic/Focus:  ?Wrap up group ? ?Participation Level: Did Not Attend ? ?Participation Quality:  Not Applicable ? ?Affect:  Not Applicable ? ?Cognitive:  Not Applicable ? ?Insight:  Not Applicable ? ?Engagement in Group: Not Applicable ? ?Additional Comments:  Did not attend .  ? ?Winfield Rast S ?12/05/2021, 8:49 PM ? ?
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
## 2021-12-05 NOTE — Progress Notes (Signed)
NUTRITION ASSESSMENT ? ?Pt identified as at risk on the Malnutrition Screen Tool ? ?INTERVENTION: ?1. Supplements: Ensure Plus High Protein po BID, each supplement provides 350 kcal and 20 grams of protein.  ? ?NUTRITION DIAGNOSIS: ?Unintentional weight loss related to sub-optimal intake as evidenced by pt report.  ? ?Goal: ?Pt to meet >/= 90% of their estimated nutrition needs. ? ?Monitor:  ?PO intake ? ?Assessment:  ?Pt admitted with depression. Pt with h/o G-tube. No longer using for feedings at this time. Pt reports not eating for a week PTA.  ?Pt has had significant weight loss since 08/13/21 (12% wt loss x 4 months, significant for time frame).  ?Will add Ensure supplements given continued weight loss. ? ?Height: ?Ht Readings from Last 1 Encounters:  ?12/04/21 '5\' 6"'$  (1.676 m)  ? ? ?Weight: ?Wt Readings from Last 1 Encounters:  ?12/04/21 85.9 kg  ? ? ?Weight Hx: ?Wt Readings from Last 10 Encounters:  ?12/04/21 85.9 kg  ?10/08/21 98 kg  ?08/13/21 97.9 kg  ?05/04/20 102.6 kg  ?03/26/19 104.3 kg  ?10/20/18 98 kg  ?04/04/16 102.1 kg  ?10/26/15 106 kg  ?07/04/15 99.3 kg  ?06/25/15 102.2 kg  ? ? ?BMI:  Body mass index is 30.57 kg/m?Marland Kitchen ?Pt meets criteria for obesity based on current BMI. ? ?Estimated Nutritional Needs: ?Kcal: 25-30 kcal/kg ?Protein: > 1 gram protein/kg ?Fluid: 1 ml/kcal ? ?Diet Order:  ?Diet Order   ? ? None  ? ?  ? ?Pt is also offered choice of unit snacks mid-morning and mid-afternoon.  ?Pt is eating as desired.  ? ?Lab results and medications reviewed.  ? ?Clayton Bibles, MS, RD, LDN ?Inpatient Clinical Dietitian ?Contact information available via Amion ? ? ?

## 2021-12-05 NOTE — Progress Notes (Signed)
Patient is confused and forgetful during shift assessment, she asks questions repeatedly. She is alert and oriented to place and person, tolerated her scheduled medication well with no adverse effect noted. No acute distress noted at this time. Staff will continue to provide reassurance to patient.  ? 12/05/21 0800  ?Psych Admission Type (Psych Patients Only)  ?Admission Status Voluntary  ?Psychosocial Assessment  ?Patient Complaints Anxiety  ?Eye Contact Brief  ?Facial Expression Anxious  ?Affect Anxious  ?Speech Slow;Other (Comment) ?(Forgetful)  ?Interaction Isolative;Minimal;Poor  ?Motor Activity Slow  ?Appearance/Hygiene Disheveled  ?Behavior Characteristics Cooperative  ?Mood Depressed;Anxious  ?Thought Process  ?Coherency Incoherent  ?Content Other (Comment) ?(confusion)  ?Delusions None reported or observed  ?Hallucination None reported or observed  ?Judgment Impaired  ?Confusion Moderate  ?Danger to Self  ?Agreement Not to Harm Self Yes  ?Description of Agreement Verbal  ? ? ?

## 2021-12-05 NOTE — H&P (Signed)
Psychiatric Admission Assessment Adult ? ?Patient Identification: Dorothy Alvarez ?MRN:  [id number] ?Date of Evaluation:  12/05/2021 ?Chief Complaint:  MDD (major depressive disorder), recurrent episode, severe ([place]) [F33.2] ?Numbness and tingling in left arm [R20.0, R20.2] ?Principal Diagnosis: MDD (major depressive disorder), recurrent episode, severe ([place]) ?Diagnosis:  Principal Problem: ?  MDD (major depressive disorder), recurrent episode, severe ([place]) ?Active Problems: ?  Insomnia ?  GAD (generalized anxiety disorder) ?  Vascular dementia ([place]) ? ?History of Present Illness:  ?Patient is a 56 year old female with a psychiatric history major depressive disorder versus bipolar disorder, GAD, 2 strokes, history of benzodiazepine addiction, who was admitted to the psychiatric unit for evaluation and treatment of worsening depression, suicidal statements, and bizarre behaviors. ? ?PTA psychiatric medication: Seroquel 200 mg nightly (patient received one 300 mg dose once prior to the emergency department evaluation). ? ?Per husband, patient had 2 strokes, the first in November 2022 and the second stroke in December 2022.  He states that before the second stroke in December, she was not alert, unresponsive, and sleeping most of the day until they took her to the hospital for evaluation.  He reports "she was back to her self eventually after the strokes... Until she started to decline again".  He states that over the past 2 weeks the patient has been sleeping much less, wanting to take multiple baths during the day and night, wanting to go outside at 2 to 3 AM, so on the porch, walk down the street, and telling him that if she had a gun she would shoot herself, overdose on pills.  The husband thinks she is saying these things "for attention".He reports that since the stroke, she has been regressing, asking the same questions over and over again, and that her short-term memory problems have gotten worse since the second  stroke.   ?He reports that for a while after having a feeding tube removed "she was eating real good".  But over the past month or so, she has less p.o. intake.He reports that he thinks the patient has been cheeking the Seroquel at bedtime and the family is aware of this.  He states that she wants to stay awake while others are sleeping so she can take baths. ?He confirms he has a history of bipolar disorder, but is unsure of her medication history.  He reports most of her psych meds were stopped after the stroke and that she is now only on Seroquel.  He reports daughter manages the medications.  She is accepting and taking medications at home, except for the Seroquel, which he assumes she is cheeking. ? ?Her daughter, Phineas Real -she confirms that the patient had for stroke around November 2022.  She reports that prior to her first stroke patient was repeating herself more and more, asking the same questions over and over again.  She reports that before the second stroke, she was sleeping more, not getting out of bed except for using the bathroom, and the family assume this was due to medications.  Eventually she became very incoherent, could not bathe herself, can dress herself, had trouble sleeping, and was taken to [facility] where she was told she had had 2 strokes (1 was old and thought to be in November, and the second apparently it happened within the hours to days prior to the patient being taken to the hospital in December 2022.  Per report the patient was also septic.).  Daughter confirms PTA of medications.  All were currently ordered except for  Lasix.  Daughter confirms patient was on Seroquel 200 mg at bedtime, and that the patient received a 300 mg dose only once prior to coming to the emergency department for evaluation for this admission. ?Daughter reports that since the stroke, the patient has worsening short-term memory.  She reports patient has had worsening erratic behavior including walking off when  in public, urinating in public, telling her "I want to go into the street and get hit by car or if I had a gun I would shoot myself".  Daughter also reports the patient has been speaking in the third person for a week or 2.  She also reports the patient has been chanting random statements "I can and I cannot".  She reports the patient has been lying on the ground in front of their house.  She reports the patient has had repetitive behaviors such as counting, and taking baths.  Daughter is also unsure if patient is spitting out the Seroquel medication.  Daughter also mentions the patient was an addict in the past and had done things to get the medications that she wanted.  Daughter confirmed patient was previously diagnosed with bipolar disorder, depression and anxiety.  Daughter confirms patient was on Xanax in the past.  The patient has told the daughter that she feels hopeless and useless since the stroke and misses driving. ? ?On my exam today, the patient reports that she has been admitted to the hospital "because I cannot drive my car".  Patient reports that recently her mood has been up and down and reports feeling down depressed sad and hopeless.  She reports anhedonia.  She reports elevated anxiety.  She reports poor sleep with difficulty initiating and maintaining sleep.  Reports low energy.  Denies any change in appetite or weight loss.  Patient demonstrates poor concentration.  She denies having suicidal thoughts at this time.  Denies having homicidal thoughts. ?Patient unsure if she has had a panic attack in the past. ?Denies psychotic symptoms including hallucinations paranoia or thought control or thought insertion and ideas of reference. ?Denies having symptoms that meet criteria for hypomania or mania at this time or in the past. ?Of note, the patient asked this writer over and over again what his name is and if she is being discharged today.  Patient is unaware of why she is in the hospital.  She is  A&O x2, self and place. ?Patient reports that she was eating once a day including chicken sandwich and broccoli, which is true.  Patient reports having regular bowel movements and needs to use the restroom for bowel movement at the conclusion of this interview. ? ?Past psychiatric history: ?MDD versus bipolar disorder ?GAD ?Patient reports being hospitalized "maybe once in the past" ?Denies history of suicide attempt ?Current psychiatric medication: Seroquel 200 mg nightly ?Past psychiatric medication history: Patient unsure, per daughter Xanax ? ?Past medical history: CVA x2, hypertension ?Denies seizure history ?Surgical history: Hysterectomy, knee surgery ?Allergies -denies ? ?Patient denies any family history of psychiatric illness or suicide attempt. ? ?Patient denies any history of substance use including alcohol use, tobacco or nicotine or bathing, and or illicit drug use ? ?MOCA score: 12/30 ?SLUMS: 10/30 ? ? ?Duration of Depression Symptoms: Greater than two weeks ? ?Total Time spent with patient: 45 minutes ? ? ? ?Is the patient at risk to self? Yes.    ?Has the patient been a risk to self in the past 6 months? Yes.    ?Has the patient  been a risk to self within the distant past? No.  ?Is the patient a risk to others? No.  ?Has the patient been a risk to others in the past 6 months? No.  ?Has the patient been a risk to others within the distant past? No.  ? ?Prior Inpatient Therapy:   ?Prior Outpatient Therapy:   ? ?Alcohol Screening: 1. How often do you have a drink containing alcohol?: Never ?2. How many drinks containing alcohol do you have on a typical day when you are drinking?: 1 or 2 ?3. How often do you have six or more drinks on one occasion?: Never ?AUDIT-C Score: 0 ?4. How often during the last year have you found that you were not able to stop drinking once you had started?: Never ?5. How often during the last year have you failed to do what was normally expected from you because of drinking?:  Never ?6. How often during the last year have you needed a first drink in the morning to get yourself going after a heavy drinking session?: Never ?7. How often during the last year have you had a feeli

## 2021-12-06 ENCOUNTER — Encounter (HOSPITAL_COMMUNITY): Payer: Self-pay

## 2021-12-06 MED ORDER — OLANZAPINE 5 MG PO TABS
5.0000 mg | ORAL_TABLET | Freq: Once | ORAL | Status: AC
  Administered 2021-12-06: 5 mg via ORAL
  Filled 2021-12-06: qty 1

## 2021-12-06 MED ORDER — OLANZAPINE 5 MG PO TBDP
ORAL_TABLET | ORAL | Status: AC
  Filled 2021-12-06: qty 1

## 2021-12-06 NOTE — [facility] Group Notes (Signed)
Patient attended morning/orientation group, but did not participate. ?

## 2021-12-06 NOTE — BH IP Treatment Plan (Signed)
Interdisciplinary Treatment and Diagnostic Plan Update ? ?12/06/2021 ?Time of Session: 10:00am ?Dorothy Alvarez ?MRN: [id number] ? ?Principal Diagnosis: MDD (major depressive disorder), recurrent episode, severe ([place]) ? ?Secondary Diagnoses: Principal Problem: ?  MDD (major depressive disorder), recurrent episode, severe ([place]) ?Active Problems: ?  Insomnia ?  GAD (generalized anxiety disorder) ?  Vascular dementia ([place]) ? ? ?Current Medications:  ?Current Facility-Administered Medications  ?Medication Dose Route Frequency Provider Last Rate Last Admin  ? acetaminophen (TYLENOL) tablet 650 mg  650 mg Oral Q6H PRN Rankin, Shuvon B, NP      ? alum & mag hydroxide-simeth (MAALOX/MYLANTA) 200-200-20 MG/5ML suspension 30 mL  30 mL Oral Q4H PRN Rankin, Shuvon B, NP      ? apixaban (ELIQUIS) tablet 5 mg  5 mg Oral BID Rankin, Shuvon B, NP   5 mg at 12/06/21 1016  ? atorvastatin (LIPITOR) tablet 80 mg  80 mg Oral Daily Rankin, Shuvon B, NP   80 mg at 12/06/21 1017  ? carvedilol (COREG) tablet 6.25 mg  6.25 mg Oral BID WC Rankin, Shuvon B, NP   6.25 mg at 12/06/21 1017  ? cloNIDine (CATAPRES) tablet 0.1 mg  0.1 mg Oral BID Rankin, Shuvon B, NP   0.1 mg at 12/06/21 1015  ? feeding supplement (ENSURE ENLIVE / ENSURE PLUS) liquid 237 mL  237 mL Oral BID BM Massengill, Nathan, MD   237 mL at 12/06/21 1017  ? furosemide (LASIX) tablet 80 mg  80 mg Oral Daily Massengill, Ovid Curd, MD   80 mg at 12/06/21 1016  ? isosorbide dinitrate (ISORDIL) tablet 10 mg  10 mg Oral TID Rankin, Shuvon B, NP   10 mg at 12/06/21 1018  ? magnesium hydroxide (MILK OF MAGNESIA) suspension 30 mL  30 mL Oral Daily PRN Rankin, Shuvon B, NP      ? melatonin tablet 5 mg  5 mg Oral QHS Rankin, Shuvon B, NP   5 mg at 12/05/21 2112  ? OLANZapine (ZYPREXA) injection 5 mg  5 mg Intramuscular TID PRN Massengill, Ovid Curd, MD      ? potassium chloride SA (KLOR-CON M) CR tablet 20 mEq  20 mEq Oral BID Janine Limbo, MD   20 mEq at 12/06/21 1016  ? risperiDONE (RISPERDAL  M-TABS) disintegrating tablet 0.5 mg  0.5 mg Oral QHS Massengill, Ovid Curd, MD   0.5 mg at 12/05/21 2113  ? risperiDONE (RISPERDAL) tablet 0.5 mg  0.5 mg Oral TID PRN Massengill, Ovid Curd, MD      ? sacubitril-valsartan (ENTRESTO) 24-26 mg per tablet  1 tablet Oral BID Rankin, Shuvon B, NP   1 tablet at 12/06/21 1017  ? sertraline (ZOLOFT) tablet 50 mg  50 mg Oral Daily Massengill, Ovid Curd, MD   50 mg at 12/06/21 1014  ? ?PTA Medications: ?Medications Prior to Admission  ?Medication Sig Dispense Refill Last Dose  ? acetaminophen (TYLENOL) 325 MG tablet Place 2 tablets (650 mg total) into feeding tube every 4 (four) hours as needed for mild pain (or temp > 37.5 C (99.5 F)). (Patient not taking: Reported on 12/03/2021)     ? apixaban (ELIQUIS) 5 MG TABS tablet Place 1 tablet (5 mg total) into feeding tube 2 (two) times daily. (Patient taking differently: Take 5 mg by mouth 2 (two) times daily.) 60 tablet 1   ? atorvastatin (LIPITOR) 80 MG tablet Place 1 tablet (80 mg total) into feeding tube daily. (Patient taking differently: Take 80 mg by mouth daily.) 30 tablet 1   ? carvedilol (COREG)  6.25 MG tablet Place 1 tablet (6.25 mg total) into feeding tube 2 (two) times daily with a meal. (Patient taking differently: Take 6.25 mg by mouth 2 (two) times daily with a meal.) 60 tablet 1   ? cloNIDine (CATAPRES) 0.1 MG tablet Place 1 tablet (0.1 mg total) into feeding tube 2 (two) times daily. (Patient taking differently: Take 0.1 mg by mouth 2 (two) times daily.) 60 tablet 11   ? furosemide (LASIX) 80 MG tablet Take 1 tablet (80 mg total) by mouth daily. 30 tablet 0   ? isosorbide dinitrate (ISORDIL) 10 MG tablet Place 1 tablet (10 mg total) into feeding tube 3 (three) times daily. (Patient taking differently: Take 10 mg by mouth 3 (three) times daily.) 90 tablet 1   ? methylphenidate (RITALIN) 5 MG tablet Place 1 tablet (5 mg total) into feeding tube 2 (two) times daily with breakfast and lunch. (Patient not taking: Reported on  12/03/2021) 60 tablet 0   ? Nutritional Supplements (FEEDING SUPPLEMENT, OSMOLITE 1.5 CAL,) LIQD Place 355 mLs into feeding tube 4 (four) times daily. (Patient not taking: Reported on 12/03/2021) 42600 mL 5   ? potassium chloride (KLOR-CON) 20 MEQ packet Place 20 mEq into feeding tube 2 (two) times daily. (Patient taking differently: Take 20 mEq by mouth 2 (two) times daily.) 60 each 2   ? QUEtiapine (SEROQUEL) 300 MG tablet Take 300 mg by mouth at bedtime.     ? sacubitril-valsartan (ENTRESTO) 24-26 MG Take 1 tablet by mouth 2 (two) times daily. 60 tablet 1   ? ? ?Patient Stressors:   ? ?Patient Strengths:   ? ?Treatment Modalities: Medication Management, Group therapy, Case management,  ?1 to 1 session with clinician, Psychoeducation, Recreational therapy. ? ? ?Physician Treatment Plan for Primary Diagnosis: MDD (major depressive disorder), recurrent episode, severe ([place]) ?Long Term Goal(s): Improvement in symptoms so as ready for discharge  ? ?Short Term Goals: Ability to identify changes in lifestyle to reduce recurrence of condition will improve ?Ability to verbalize feelings will improve ?Ability to disclose and discuss suicidal ideas ?Ability to demonstrate self-control will improve ?Ability to identify and develop effective coping behaviors will improve ?Ability to maintain clinical measurements within normal limits will improve ?Compliance with prescribed medications will improve ?Ability to identify triggers associated with substance abuse/mental health issues will improve ? ?Medication Management: Evaluate patient's response, side effects, and tolerance of medication regimen. ? ?Therapeutic Interventions: 1 to 1 sessions, Unit Group sessions and Medication administration. ? ?Evaluation of Outcomes: Not Met ? ?Physician Treatment Plan for Secondary Diagnosis: Principal Problem: ?  MDD (major depressive disorder), recurrent episode, severe ([place]) ?Active Problems: ?  Insomnia ?  GAD (generalized anxiety  disorder) ?  Vascular dementia ([place]) ? ?Long Term Goal(s): Improvement in symptoms so as ready for discharge  ? ?Short Term Goals: Ability to identify changes in lifestyle to reduce recurrence of condition will improve ?Ability to verbalize feelings will improve ?Ability to disclose and discuss suicidal ideas ?Ability to demonstrate self-control will improve ?Ability to identify and develop effective coping behaviors will improve ?Ability to maintain clinical measurements within normal limits will improve ?Compliance with prescribed medications will improve ?Ability to identify triggers associated with substance abuse/mental health issues will improve    ? ?Medication Management: Evaluate patient's response, side effects, and tolerance of medication regimen. ? ?Therapeutic Interventions: 1 to 1 sessions, Unit Group sessions and Medication administration. ? ?Evaluation of Outcomes: Not Met ? ? ?RN Treatment Plan for Primary Diagnosis: MDD (major depressive  disorder), recurrent episode, severe ([place]) ?Long Term Goal(s): Knowledge of disease and therapeutic regimen to maintain health will improve ? ?Short Term Goals: Ability to remain free from injury will improve, Ability to verbalize frustration and anger appropriately will improve, Ability to demonstrate self-control, Ability to participate in decision making will improve, Ability to verbalize feelings will improve, Ability to disclose and discuss suicidal ideas, Ability to identify and develop effective coping behaviors will improve, and Compliance with prescribed medications will improve ? ?Medication Management: RN will administer medications as ordered by provider, will assess and evaluate patient's response and provide education to patient for prescribed medication. RN will report any adverse and/or side effects to prescribing provider. ? ?Therapeutic Interventions: 1 on 1 counseling sessions, Psychoeducation, Medication administration, Evaluate responses to  treatment, Monitor vital signs and CBGs as ordered, Perform/monitor CIWA, COWS, AIMS and Fall Risk screenings as ordered, Perform wound care treatments as ordered. ? ?Evaluation of Outcomes: Not Met ? ? ?LCSW Tr

## 2021-12-06 NOTE — Progress Notes (Signed)
Pt woke and entered the hallway at 0130 asking where the bathroom was. Pt was previously just in the bathroom at 2345. Pt again entered the hallway at [place] asking if she could use the bathroom again and asked where it was just 10 minutes later.  ?

## 2021-12-06 NOTE — Progress Notes (Signed)
[facility] MD Progress Note ? ?12/06/2021 4:47 PM ?Dorothy Alvarez  ?MRN:  [id number] ? ? ?Subjective:   ?Patient is a 56 year old female with a psychiatric history major depressive disorder versus bipolar disorder, GAD, 2 strokes, history of benzodiazepine addiction, who was admitted to the psychiatric unit for evaluation and treatment of worsening depression, suicidal statements, and bizarre behaviors. ? ?Diagnosis list upon admission: ?-Major depressive disorder, severe, recurrent without psychotic features -vs-bipolar disorder.   ?-GAD ?-History of CVA ?-Consideration for vascular dementia.   ?-History of sedative hypnotic use disorder ? ?Yesterday, psychiatric team made the following recommendations: ?             ?-Start Zoloft 25 mg once daily for MDD and GAD.  Increase to 50 mg once daily tomorrow.  ?-Start Risperdal 0.5 mg at bedtime.   ?-Start melatonin ? ?On examination today, the patient reports that her mood is euthymic.  She slept 4 hours overnight.  She reports taking "a while to fall asleep".  She reports that when she felt if she maintains sleep. ?Per nursing staff, patient was confusion in the night, cannot find her room, and had bowel incontinence last night.  Per nursing she has not had any behavioral disturbances or outbursts in the last 24 hours. ?Patient does recall bowel incontinence last night. ?She reports anxiety is up and down due to worry about when she will be discharged.  She reports that appetite is "okay" and reports eating some of her last 3 meals. ?Denies SI.  Denies HI.  Denies AVH.  Denies paranoia. ?When asked about behaviors preceding admission, patient reports she likes to take baths and that is what she takes in the baths.  She reports she likes the feeling of the water. ?She does not have explanation for leaving the house in the early morning hours, or urinating in public. ?She denies side effects to Zoloft and Risperdal.  No EPS on exam today. ? ?Overall her concentration is better.   Her affect is brighter.  She is some less confused.  It seems the patient has adequate energy during the day and denies feeling fatigued, with the amount of sleep she had last night, which was reported to be 4 hours. ? ? ? ?Principal Problem: MDD (major depressive disorder), recurrent episode, severe ([place]) ?Diagnosis: Principal Problem: ?  MDD (major depressive disorder), recurrent episode, severe ([place]) ?Active Problems: ?  Insomnia ?  GAD (generalized anxiety disorder) ?  Vascular dementia ([place]) ? ?Total Time spent with patient: 20 minutes ? ?Past Psychiatric History:  ?MDD versus bipolar disorder ?GAD ?Patient reports being hospitalized "maybe once in the past" ?Denies history of suicide attempt ?Current psychiatric medication: Seroquel 200 mg nightly ?Past psychiatric medication history: Patient unsure, per daughter Xanax ? ?Past Medical History:  ?Past Medical History:  ?Diagnosis Date  ? Allergy   ? Anxiety   ? Arthritis   ? Bipolar 1 disorder ([place])   ? Chronic abdominal pain   ? Constipation   ? Depression   ? Heart failure ([place])   ? Hypertension   ? IBS (irritable bowel syndrome)   ? Learning disability   ? Metabolic encephalopathy   ? Neuromuscular disorder ([place])   ? Rhabdomyolysis   ? Sepsis ([place])   ? Stroke [facility])   ?  ?Past Surgical History:  ?Procedure Laterality Date  ? ABDOMINAL HYSTERECTOMY    ? GALLBLADDER SURGERY    ? IR GASTROSTOMY TUBE MOD SED  08/07/2021  ? KNEE SURGERY    ?  RIGHT/LEFT HEART CATH AND CORONARY ANGIOGRAPHY N/A 05/04/2020  ? Procedure: RIGHT/LEFT HEART CATH AND CORONARY ANGIOGRAPHY;  Surgeon: Belva Crome, MD;  Location: [facility];  Service: Cardiovascular;  Laterality: N/A;  ? ?Family History:  ?Family History  ?Problem Relation Age of Onset  ? Diabetes Mother   ? Suicidality Mother   ? Heart disease Father   ? Depression Father   ? ?Family Psychiatric  History: Patient denies any family history of psychiatric illness or suicide attempt. ? ?Social History:  ?Social History   ? ?Substance and Sexual Activity  ?Alcohol Use No  ?   ?Social History  ? ?Substance and Sexual Activity  ?Drug Use Yes  ? Types: Other-see comments, Marijuana  ? Comment: opiates  ?  ?Social History  ? ?Socioeconomic History  ? Marital status: Legally Separated  ?  Spouse name: Not on file  ? Number of children: 2  ? Years of education: Not on file  ? Highest education level: Not on file  ?Occupational History  ? Occupation: DISABLED  ?  Employer: UNEMPLOYED  ?Tobacco Use  ? Smoking status: Never  ? Smokeless tobacco: Never  ?Vaping Use  ? Vaping Use: Never used  ?Substance and Sexual Activity  ? Alcohol use: No  ? Drug use: Yes  ?  Types: Other-see comments, Marijuana  ?  Comment: opiates  ? Sexual activity: Not Currently  ?  Birth control/protection: Surgical  ?Other Topics Concern  ? Not on file  ?Social History Narrative  ? Not on file  ? ?Social Determinants of Health  ? ?Financial Resource Strain: Not on file  ?Food Insecurity: Not on file  ?Transportation Needs: Not on file  ?Physical Activity: Not on file  ?Stress: Not on file  ?Social Connections: Not on file  ? ?Additional Social History:  ?  ?  ?  ?  ?  ?  ?  ?  ?  ?  ?  ? ?Sleep: Fair ? ?Appetite:  Fair ? ?Current Medications: ?Current Facility-Administered Medications  ?Medication Dose Route Frequency Provider Last Rate Last Admin  ? acetaminophen (TYLENOL) tablet 650 mg  650 mg Oral Q6H PRN Rankin, Shuvon B, NP      ? alum & mag hydroxide-simeth (MAALOX/MYLANTA) 200-200-20 MG/5ML suspension 30 mL  30 mL Oral Q4H PRN Rankin, Shuvon B, NP      ? apixaban (ELIQUIS) tablet 5 mg  5 mg Oral BID Rankin, Shuvon B, NP   5 mg at 12/06/21 1016  ? atorvastatin (LIPITOR) tablet 80 mg  80 mg Oral Daily Rankin, Shuvon B, NP   80 mg at 12/06/21 1017  ? carvedilol (COREG) tablet 6.25 mg  6.25 mg Oral BID WC Rankin, Shuvon B, NP   6.25 mg at 12/06/21 1017  ? cloNIDine (CATAPRES) tablet 0.1 mg  0.1 mg Oral BID Rankin, Shuvon B, NP   0.1 mg at 12/06/21 1015  ? feeding  supplement (ENSURE ENLIVE / ENSURE PLUS) liquid 237 mL  237 mL Oral BID BM Hy Swiatek, MD   237 mL at 12/06/21 1427  ? furosemide (LASIX) tablet 80 mg  80 mg Oral Daily Miliana Gangwer, Ovid Curd, MD   80 mg at 12/06/21 1016  ? isosorbide dinitrate (ISORDIL) tablet 10 mg  10 mg Oral TID Rankin, Shuvon B, NP   10 mg at 12/06/21 1426  ? magnesium hydroxide (MILK OF MAGNESIA) suspension 30 mL  30 mL Oral Daily PRN Rankin, Shuvon B, NP      ?  melatonin tablet 5 mg  5 mg Oral QHS Rankin, Shuvon B, NP   5 mg at 12/05/21 2112  ? OLANZapine (ZYPREXA) injection 5 mg  5 mg Intramuscular TID PRN Margalit Leece, Ovid Curd, MD      ? potassium chloride SA (KLOR-CON M) CR tablet 20 mEq  20 mEq Oral BID Janine Limbo, MD   20 mEq at 12/06/21 1016  ? risperiDONE (RISPERDAL M-TABS) disintegrating tablet 0.5 mg  0.5 mg Oral QHS Areg Bialas, Ovid Curd, MD   0.5 mg at 12/05/21 2113  ? risperiDONE (RISPERDAL) tablet 0.5 mg  0.5 mg Oral TID PRN Rafeef Lau, Ovid Curd, MD      ? sacubitril-valsartan (ENTRESTO) 24-26 mg per tablet  1 tablet Oral BID Rankin, Shuvon B, NP   1 tablet at 12/06/21 1017  ? sertraline (ZOLOFT) tablet 50 mg  50 mg Oral Daily Taten Merrow, Ovid Curd, MD   50 mg at 12/06/21 1014  ? ? ?Lab Results:  ?Results for orders placed or performed during the hospital encounter of 12/04/21 (from the past 48 hour(s))  ?Glucose, capillary     Status: Abnormal  ? Collection Time: 12/04/21  5:18 PM  ?Result Value Ref Range  ? Glucose-Capillary 131 (H) 70 - 99 mg/dL  ?  Comment: Glucose reference range applies only to samples taken after fasting for at least 8 hours.  ?Folate, serum, performed at [facility] lab     Status: None  ? Collection Time: 12/05/21  6:21 PM  ?Result Value Ref Range  ? Folate 10.7 >5.9 ng/mL  ?  Comment: Performed at [facility], [place] [address], [place], [place] [address]  ?Vitamin B12     Status: None  ? Collection Time: 12/05/21  6:21 PM  ?Result Value Ref Range  ? Vitamin B-12 317 180 - 914 pg/mL  ?   Comment: (NOTE) ?This assay is not validated for testing neonatal or ?myeloproliferative syndrome specimens for Vitamin B12 levels. ?Performed at [facility], [place]

## 2021-12-06 NOTE — Progress Notes (Signed)
D.  Pt extremely confused, asks same question within minutes of asking first time.  This continues throughout.  Pt states that she is not sleeping.  Pt on phone and would put phone down but not hang it up, pace around, then go back to phone and speak to caller as if she had not walked away.  Pt did this many times during call.  Night medication given to Pt, explained what the medication is for, Pt within minutes would ask again what it was and what it was for.  Pt had to be shown where room was twice. It was stated by day shift RN that Pt is less confused during day, appears to "sundown" at night. ? ?A.  Medications given as ordered.  Support, encouragement, and redirection offered.   ? ?R. Pt remains safe on the unit.  Will continue to monitor.   ?

## 2021-12-06 NOTE — [facility] Group Notes (Addendum)
Pt did attend AA meeting 

## 2021-12-06 NOTE — Group Note (Signed)
[place] LCSW Group Therapy Note ? ?Date/Time: ? ?Type of Therapy and Topic:  Group Therapy:  Communication ? ?Participation Level:  Active ? ?Description of Group:   ? In this group patients will be encouraged to explore how individuals communicate with one another appropriately and inappropriately. Patients will be guided to discuss their thoughts, feelings, and behaviors related to barriers communicating feelings, needs, and stressors. The group will process together ways to execute positive and appropriate communications, with attention given to how one use behavior, tone, and body language to communicate. Patient will be encouraged to reflect on an incident where they were successfully able to communicate and the factors that they believe helped them to communicate. Each patient will be encouraged to identify specific changes they are motivated to make in order to overcome communication barriers with self, peers, authority, and parents. This group will be process-oriented, with patients participating in exploration of their own experiences as well as giving and receiving support and challenging self as well as other group members. ? ?Therapeutic Goals: ?Patient will identify how people communicate (body language, facial expression, and electronics) Also discuss tone, voice and how these impact what is communicated and how the message is perceived.  ?Patient will identify feelings (such as fear or worry), thought process and behaviors related to why people internalize feelings rather than express self openly. ?Patient will identify two changes they are willing to make to overcome communication barriers. ?Members will then practice through Role Play how to communicate by utilizing psycho-education material (such as I Feel statements and acknowledging feelings rather than displacing on others) ? ? ?Summary of Patient Progress:Patient participated approporiately in group. At times she seemed confused about group topic  and questions being asked.  Patient unable to participate in intro question.  Pt listened to feedback from other group members around group topic.  ? ? ? ? ?Therapeutic Modalities:   ?Cognitive Behavioral Therapy ?Solution Focused Therapy ?Motivational Interviewing ?Family Systems Approach ? ? ?Dorothy Kook, LCSW, LCAS ?Clincal Social Worker  ?[facility] ? ? ?

## 2021-12-06 NOTE — Progress Notes (Signed)
Pt confused, asked repeatedly what medication she is taking, same questions. ? ? ? 12/06/21 2008  ?Psych Admission Type (Psych Patients Only)  ?Admission Status Voluntary  ?Psychosocial Assessment  ?Patient Complaints Restlessness  ?Eye Contact Brief  ?Facial Expression Anxious  ?Affect Appropriate to circumstance  ?Speech Soft  ?Interaction Minimal  ?Motor Activity Slow  ?Appearance/Hygiene Disheveled  ?Behavior Characteristics Appropriate to situation  ?Mood Labile;Depressed  ?Thought Process  ?Coherency Disorganized  ?Content Other (Comment) ?(repititive)  ?Delusions None reported or observed  ?Perception WDL  ?Hallucination None reported or observed  ?Judgment Limited  ?Confusion Moderate  ?Danger to Self  ?Current suicidal ideation? Denies  ?Agreement Not to Harm Self Yes  ?Description of Agreement verbal  ?Danger to Others  ?Danger to Others None reported or observed  ? ? ?

## 2021-12-06 NOTE — Progress Notes (Signed)
Pt called [facility] into room and stated she felt like she was  having a stroke.  BP 127/80,, HR 79, O2 100% CBG 159.  Neuro-check performed and no deficits found.   NP made aware.  Pt denied pain and appeared in no acute distress.  Will continue to monitor closely.   ?

## 2021-12-06 NOTE — Progress Notes (Signed)
?   12/06/21 1000  ?Psych Admission Type (Psych Patients Only)  ?Admission Status Voluntary  ?Psychosocial Assessment  ?Patient Complaints Anxiety;Restlessness;Insomnia  ?Eye Contact Brief  ?Facial Expression Anxious  ?Affect Anxious;Labile  ?Speech Soft  ?Interaction Isolative;Minimal  ?Motor Activity Slow  ?Appearance/Hygiene Disheveled;Poor hygiene  ?Behavior Characteristics Cooperative  ?Mood Labile;Depressed  ?Thought Process  ?Coherency Disorganized;Unable to assess ?(Forgetful and confused)  ?Content Other (Comment) ?(repetitive)  ?Delusions None reported or observed  ?Perception WDL  ?Hallucination None reported or observed  ?Judgment Impaired  ?Confusion Moderate  ?Danger to Self  ?Current suicidal ideation? Denies  ?Agreement Not to Harm Self Yes  ?Description of Agreement Verbal contract  ? ? ?

## 2021-12-07 LAB — GLUCOSE, CAPILLARY: Glucose-Capillary: 159 mg/dL — ABNORMAL HIGH (ref 70–99)

## 2021-12-07 MED ORDER — OLANZAPINE 10 MG IM SOLR: 5.0000 mg | Freq: Three times a day (TID) | INTRAMUSCULAR | Status: DC | PRN

## 2021-12-07 MED ORDER — OLANZAPINE 5 MG PO TABS: 5.0000 mg | ORAL_TABLET | Freq: Three times a day (TID) | ORAL | Status: DC | PRN

## 2021-12-07 MED ORDER — OLANZAPINE 5 MG PO TBDP
ORAL_TABLET | ORAL | Status: AC
  Administered 2021-12-07: 5 mg
  Filled 2021-12-07: qty 1

## 2021-12-07 NOTE — Progress Notes (Signed)
Adult Psychoeducational Group Note ? ?Date:  12/07/2021 ?Time:  8:43 PM ? ?Group Topic/Focus:  ?Wrap-Up Group:   The focus of this group is to help patients review their daily goal of treatment and discuss progress on daily workbooks. ? ?Participation Level:  Minimal ? ?Participation Quality:  Sharing ? ?Affect:  Appropriate ? ?Cognitive:  Lacking ? ?Insight: Limited ? ?Engagement in Group:  Engaged ? ?Modes of Intervention:  Education and Exploration ? ?Additional Comments:  Patient attended and participated in group tonight. She reports that today she learn about different people and their challenges  ? ?Debe Coder ?12/07/2021, 8:43 PM ?

## 2021-12-07 NOTE — Plan of Care (Signed)
  Problem: Education: Goal: Knowledge of [facility] Education information/materials will improve Outcome: Progressing Goal: Emotional status will improve Outcome: Progressing Goal: Mental status will improve Outcome: Progressing Goal: Verbalization of understanding the information provided will improve Outcome: Progressing   

## 2021-12-07 NOTE — Progress Notes (Signed)
?   12/07/21 2245  ?Psych Admission Type (Psych Patients Only)  ?Admission Status Voluntary  ?Psychosocial Assessment  ?Patient Complaints Anxiety;Insomnia  ?Eye Contact Brief  ?Facial Expression Anxious  ?Affect Anxious  ?Speech Soft  ?Interaction Assertive  ?Motor Activity Slow  ?Appearance/Hygiene Unremarkable  ?Behavior Characteristics Appropriate to situation  ?Mood Anxious  ?Thought Process  ?Coherency Disorganized  ?Content Other (Comment) ?(repetitive)  ?Delusions None reported or observed  ?Perception WDL  ?Hallucination None reported or observed  ?Judgment Impaired  ?Confusion Moderate  ?Danger to Self  ?Current suicidal ideation? Denies  ?Agreement Not to Harm Self Yes  ?Description of Agreement verbal  ?Danger to Others  ?Danger to Others None reported or observed  ? ? ?

## 2021-12-07 NOTE — Progress Notes (Signed)
Patient is alert and oriented to person this morning. She is not able to identify her location at this time. She is having short term memory loss. She repeats the same questions consistently over and over. Patient denies any SI/HI, denies any hallucinations. She states she is not depressed and not anxious at this time. She took her prescribed medications as ordered with no adverse reactions and she reported she slept last night.  ?

## 2021-12-07 NOTE — Progress Notes (Signed)
[facility] MD Progress Note ? ?12/07/2021 1:08 PM ?Dorothy Alvarez  ?MRN:  [id number] ? ?Subjective:  "I feel better today. I woke up and prayed, and mostly, I am lucky to be alive." ? ?Brief History: Patient is a 56 year old female with a psychiatric history major depressive disorder versus bipolar disorder, GAD, 2 strokes, history of benzodiazepine addiction, who was admitted to the psychiatric unit for evaluation and treatment of worsening depression, suicidal statements, and bizarre behaviors. ? ?On evaluation today, patient was lying down on the bed in her room. Chart reviewed, and findings shared with the treatment team and discussed with Dr. Berdine Addison. Patient is alert and calm. Oriented to person, time, place and situation. Speech is clear and with normal pattern and volume.Thought process is disorganized and linear. Thought content is is ruminating. Patient asking the same questions again and again.  ? ?Patient denied Suicidal ideation, homicidal ideation and visual/auditory hallucination. Endorsed sleeping for 7 hours last night and consuming 100% of her meals. Rated anxiety as "6" and depression as "7" on a scale of 0 to 10. Added, "My mood fluctuates up and down." Taking he medications without noted adverse effect. Denied chest pain, headache and no s/s on bleeding problem observed. No signs of compromised blood flow noted. Glucose-Capillary 159 on 12/06/21, made patient aware, will monitor and if continuous elevation will implement treatment therapy.  ?  ?Principal Problem: MDD (major depressive disorder), recurrent episode, severe ([place]) ? ?Diagnosis: Principal Problem: ?  MDD (major depressive disorder), recurrent episode, severe ([place]) ?Active Problems: ?  Insomnia ?  GAD (generalized anxiety disorder) ?  Vascular dementia ([place]) ? ?Total Time spent with patient: 30 minutes ? ?Past Psychiatric History: MDD versus bipolar disorder ?GAD ?Patient reports being hospitalized "maybe once in the past" ?Denies history of suicide  attempt ?Current psychiatric medication: Seroquel 200 mg nightly ?Past psychiatric medication history: Patient unsure, per daughter Xanax ?  ? ?Past Medical History:  ?Past Medical History:  ?Diagnosis Date  ? Allergy   ? Anxiety   ? Arthritis   ? Bipolar 1 disorder ([place])   ? Chronic abdominal pain   ? Constipation   ? Depression   ? Heart failure ([place])   ? Hypertension   ? IBS (irritable bowel syndrome)   ? Learning disability   ? Metabolic encephalopathy   ? Neuromuscular disorder ([place])   ? Rhabdomyolysis   ? Sepsis ([place])   ? Stroke [facility])   ?  ?Past Surgical History:  ?Procedure Laterality Date  ? ABDOMINAL HYSTERECTOMY    ? GALLBLADDER SURGERY    ? IR GASTROSTOMY TUBE MOD SED  08/07/2021  ? KNEE SURGERY    ? RIGHT/LEFT HEART CATH AND CORONARY ANGIOGRAPHY N/A 05/04/2020  ? Procedure: RIGHT/LEFT HEART CATH AND CORONARY ANGIOGRAPHY;  Surgeon: Belva Crome, MD;  Location: [place] [facility];  Service: Cardiovascular;  Laterality: N/A;  ? ?Family History:  ?Family History  ?Problem Relation Age of Onset  ? Diabetes Mother   ? Suicidality Mother   ? Heart disease Father   ? Depression Father   ? ?Family Psychiatric  History: Suicidality Mother, Depression Father ?Social History:  ?Social History  ? ?Substance and Sexual Activity  ?Alcohol Use No  ?   ?Social History  ? ?Substance and Sexual Activity  ?Drug Use Yes  ? Types: Other-see comments, Marijuana  ? Comment: opiates  ?  ?Social History  ? ?Socioeconomic History  ? Marital status: Legally Separated  ?  Spouse name: Not on file  ?  Number of children: 2  ? Years of education: Not on file  ? Highest education level: Not on file  ?Occupational History  ? Occupation: DISABLED  ?  Employer: UNEMPLOYED  ?Tobacco Use  ? Smoking status: Never  ? Smokeless tobacco: Never  ?Vaping Use  ? Vaping Use: Never used  ?Substance and Sexual Activity  ? Alcohol use: No  ? Drug use: Yes  ?  Types: Other-see comments, Marijuana  ?  Comment: opiates  ? Sexual activity: Not Currently   ?  Birth control/protection: Surgical  ?Other Topics Concern  ? Not on file  ?Social History Narrative  ? Not on file  ? ?Social Determinants of Health  ? ?Financial Resource Strain: Not on file  ?Food Insecurity: Not on file  ?Transportation Needs: Not on file  ?Physical Activity: Not on file  ?Stress: Not on file  ?Social Connections: Not on file  ? ?Additional Social History:  ?  ?Sleep: Good ? ?Appetite:  Good ? ?Current Medications: ?Current Facility-Administered Medications  ?Medication Dose Route Frequency Provider Last Rate Last Admin  ? acetaminophen (TYLENOL) tablet 650 mg  650 mg Oral Q6H PRN Rankin, Shuvon B, NP      ? alum & mag hydroxide-simeth (MAALOX/MYLANTA) 200-200-20 MG/5ML suspension 30 mL  30 mL Oral Q4H PRN Rankin, Shuvon B, NP      ? apixaban (ELIQUIS) tablet 5 mg  5 mg Oral BID Rankin, Shuvon B, NP   5 mg at 12/07/21 0749  ? atorvastatin (LIPITOR) tablet 80 mg  80 mg Oral Daily Rankin, Shuvon B, NP   80 mg at 12/07/21 0750  ? carvedilol (COREG) tablet 6.25 mg  6.25 mg Oral BID WC Rankin, Shuvon B, NP   6.25 mg at 12/07/21 0749  ? cloNIDine (CATAPRES) tablet 0.1 mg  0.1 mg Oral BID Rankin, Shuvon B, NP   0.1 mg at 12/07/21 0749  ? feeding supplement (ENSURE ENLIVE / ENSURE PLUS) liquid 237 mL  237 mL Oral BID BM Massengill, Nathan, MD   237 mL at 12/07/21 1300  ? furosemide (LASIX) tablet 80 mg  80 mg Oral Daily Massengill, Ovid Curd, MD   80 mg at 12/07/21 0748  ? isosorbide dinitrate (ISORDIL) tablet 10 mg  10 mg Oral TID Rankin, Shuvon B, NP   10 mg at 12/07/21 1258  ? magnesium hydroxide (MILK OF MAGNESIA) suspension 30 mL  30 mL Oral Daily PRN Rankin, Shuvon B, NP      ? melatonin tablet 5 mg  5 mg Oral QHS Rankin, Shuvon B, NP   5 mg at 12/06/21 2150  ? OLANZapine (ZYPREXA) injection 5 mg  5 mg Intramuscular TID PRN Massengill, Ovid Curd, MD      ? potassium chloride SA (KLOR-CON M) CR tablet 20 mEq  20 mEq Oral BID Janine Limbo, MD   20 mEq at 12/07/21 0749  ? risperiDONE (RISPERDAL  M-TABS) disintegrating tablet 0.5 mg  0.5 mg Oral QHS Massengill, Ovid Curd, MD   0.5 mg at 12/06/21 2149  ? risperiDONE (RISPERDAL) tablet 0.5 mg  0.5 mg Oral TID PRN Janine Limbo, MD   0.5 mg at 12/06/21 2150  ? sacubitril-valsartan (ENTRESTO) 24-26 mg per tablet  1 tablet Oral BID Rankin, Shuvon B, NP   1 tablet at 12/07/21 0749  ? sertraline (ZOLOFT) tablet 50 mg  50 mg Oral Daily Massengill, Nathan, MD   50 mg at 12/07/21 9628  ? ? ?Lab Results:  ?Results for orders placed or performed during the hospital  encounter of 12/04/21 (from the past 48 hour(s))  ?Folate, serum, performed at [facility] lab     Status: None  ? Collection Time: 12/05/21  6:21 PM  ?Result Value Ref Range  ? Folate 10.7 >5.9 ng/mL  ?  Comment: Performed at [facility], [place] [address], [place], [place] [address]  ?Vitamin B12     Status: None  ? Collection Time: 12/05/21  6:21 PM  ?Result Value Ref Range  ? Vitamin B-12 317 180 - 914 pg/mL  ?  Comment: (NOTE) ?This assay is not validated for testing neonatal or ?myeloproliferative syndrome specimens for Vitamin B12 levels. ?Performed at [facility], [address] ?[place], [place] [address] ?  ?VITAMIN D 25 Hydroxy (Vit-D Deficiency, Fractures)     Status: None  ? Collection Time: 12/05/21  6:21 PM  ?Result Value Ref Range  ? Vit D, 25-Hydroxy 36.54 30 - 100 ng/mL  ?  Comment: (NOTE) ?Vitamin D deficiency has been defined by the Institute of Medicine  ?and an Endocrine Society practice guideline as a level of serum 25-OH  ?vitamin D less than 20 ng/mL (1,2). The Endocrine Society went on to  ?further define vitamin D insufficiency as a level between 21 and 29  ?ng/mL (2). ? ?1. IOM [employer] of Medicine). 2010. Dietary reference intakes for  ?calcium and D. [place]: The [employer]. ?2. Holick MF, Binkley Keene, Bischoff-Ferrari HA, et al. Evaluation,  ?treatment, and prevention of vitamin D deficiency: an Endocrine  ?Society  clinical practice guideline, JCEM. 2011 Jul; 96(7): 1911-30. ? ?Performed at [facility], [place] [address] [place], [place] ?[address] ?  ?Glucose, capillary     Status: Abnormal  ? Collection Time: 0

## 2021-12-07 NOTE — Group Note (Signed)
LCSW Group Therapy Note ? ?12/07/2021    10:00-11:00am  ? ?Type of Therapy and Topic:  Group Therapy: Early Messages Received About Anger ? ?Participation Level:  Did Not Attend ? ? ?Description of Group:   ?In this group, patients shared and discussed the early messages received in their lives about anger through parental or other adult modeling, teaching, repression, punishment, violence, and more.  Participants identified how those childhood lessons influence even now how they usually or often react when angered.  The group discussed that anger is a secondary emotion and what may be the underlying emotional themes that come out through anger outbursts or that are ignored through anger suppression.   ? ?Therapeutic Goals: ?Patients will identify one or more childhood message about anger that they received and how it was taught to them. ?Patients will discuss how these childhood experiences have influenced and continue to influence their own expression or repression of anger even today. ?Patients will explore possible primary emotions that tend to fuel their secondary emotion of anger. ?Patients will learn that anger itself is normal and cannot be eliminated, and that healthier coping skills can assist with resolving conflict rather than worsening situations. ? ?Summary of Patient Progress:  The patient was invited to group, did not attend. ? ?Therapeutic Modalities:   ?Cognitive Behavioral Therapy ?Motivation Interviewing ? ?Berlin Hun Grossman-Orr  ?Marland Kitchen  ?

## 2021-12-07 NOTE — Progress Notes (Signed)
Pt remains confused at times and continues to ask repetitive questions, but she is compliant with medications, vital signs, and group.  ?

## 2021-12-07 NOTE — BHH Group Notes (Signed)
.  Psychoeducational Group Note ? ? ? ?Date:  4/22//23 ?Time: 1300-1400 ? ? ? ?Purpose of Group: . The group focus' on teaching patients on how to identify their needs and their Life Skills:  A group where two lists are made. What people need and what are things that we do that are unhealthy. The lists are developed by the patients and it is explained that we often do the actions that are not healthy to get our list of needs met. ? ?Goal:: to develop the coping skills needed to get their needs met ? ?Participation Level:  Active ? ?Participation Quality:  Appropriate ? ?Affect:  Appropriate ? ?Cognitive:  Oriented ? ?Insight:  Improving ? ?Engagement in Group:  Engaged ? ?Additional Comments: Pt rates her energy level at a 7/10. Pt participated fully in the group.  ?Shared thoughts and  ? ?Bryson Dames A ?

## 2021-12-07 NOTE — BHH Group Notes (Signed)
Goals Group ?4/22//2023 ? ? ?Group Focus: affirmation, clarity of thought, and goals/reality orientation ?Treatment Modality:  Psychoeducation ?Interventions utilized were assignment, group exercise, and support ?Purpose: To be able to understand and verbalize the reason for their admission to the hospital. To understand that the medication helps with their chemical imbalance but they also need to work on their choices in life. To be challenged to develop a list of 30 positives about themselves. Also introduce the concept that "feelings" are not reality. ? ?Participation Level: did not attend ? ?Dorothy Alvarez A ?

## 2021-12-08 DIAGNOSIS — F411 Generalized anxiety disorder: Secondary | ICD-10-CM

## 2021-12-08 DIAGNOSIS — F332 Major depressive disorder, recurrent severe without psychotic features: Principal | ICD-10-CM

## 2021-12-08 NOTE — BHH Group Notes (Signed)
Adult Psychoeducational Group  ?Date:  11/19/14/2023 ?Time:  1300-1400 ? ?Group Topic/Focus: Continuation of the group from Saturday. Looking at the lists that were created and talking about what needs to be done with the homework of 30 positives about themselves.  ?                                   Talking about taking their power back and helping themselves to develop a positive self esteem. ?     ?Participation Quality:  Appropriate ? ?Affect:  Appropriate ? ?Cognitive:  Oriented ? ?Insight: Improving ? ?Engagement in Group:  Engaged ? ?Modes of Intervention:  Activity, Discussion, Education, and Support ? ?Additional Comments:  Rates her energy at a 7/10. Participated fully in the group. ? ?Bryson Dames A ? ? ?

## 2021-12-08 NOTE — Group Note (Signed)
[place] LCSW Group Therapy Note ? ?12/08/2021   ? ?Type of Therapy and Topic:  Group Therapy:  Adding Supports Including Yourself ? ?Participation Level:  Active  ? ?Description of Group:   Patients in this group were introduced to the concept that additional supports including self-support are an essential part of recovery.  Patients listed their current healthy and unhealthy supports, and discussed the difference between the two.   Several songs were played and a group discussion ensued in which patients stated they could relate to the songs which inspired them to realize they have be willing to help themselves in order to succeed, because other people cannot achieve sobriety or stability for them.  Parents were encouraged toward self-advocacy and self-support as part of their recovery.  They discussed their reactions to these songs' messages, which were positive and hopeful.  Before group ended, they identified the supports they believe they need to add to their lives to achieve their goals at discharge.  ? ?Therapeutic Goals: ?1)  explain the difference between healthy and unhealthy supports and discuss what specific supports are currently in patients' lives ?2)  demonstrate the importance of being a key part of one's own support system ?3)  discuss the need for appropriate boundaries with supports ?4)  elicit ideas from patients about supports that need to be added in order to achieve goals ?  ?Summary of Patient Progress:   The patient listed current healthy supports as family and current unhealthy supports as herself.  Prior to the end of group, patient expressed that supports she needs to add at discharge include being a healthier self-support.   ? ?Therapeutic Modalities:   ?Motivational Interviewing ?Activity ? ?Dorothy Alvarez  ? ?   ?

## 2021-12-08 NOTE — Progress Notes (Signed)
[facility] MD Progress Note ? ?12/08/2021 10:56 AM ?Dorothy Alvarez  ?MRN:  [id number] ? ?Subjective:  "I  am learning good coping skills of not over thinking and staying positive in life." ? ?Brief History: Patient is a 56 year old female with a psychiatric history major depressive disorder versus bipolar disorder, GAD, 2 strokes, history of benzodiazepine addiction, who was admitted to the psychiatric unit for evaluation and treatment of worsening depression, suicidal statements, and bizarre behaviors. ? ?Daily Notes 12/08/21: On evaluation today, patient was lying down on the bed in her room. Chart reviewed, and findings shared with the treatment team and discussed with Dr. Berdine Addison. Patient is alert and calm. Oriented to person, time, place and situation. Speech is clear and with normal pattern and volume.Thought process is improved, however, still forgetful and linear. Thought content is scattered and ruminating. Patient asking the same questions again and again. Actively participating in therapeutic milieu and group activities.  ? ?Patient denied Suicidal ideation, homicidal ideation and visual/auditory hallucination. Endorsed sleeping for 7 hours last night and consuming 100% of her meals. Rated anxiety as "7" and depression as "7" on a scale of 0 to 10. Added, "I am a little calmer today." Taking her medications without noted adverse effect. Denied chest pain, shortness of breath, headache and no s/s on bleeding problem observed. No signs of compromised blood flow noted. Glucose-Capillary 159 on 12/06/21, made patient aware, will monitor and if continuous elevation will implement treatment therapy.  ?  ?Principal Problem: MDD (major depressive disorder), recurrent episode, severe ([place]) ? ?Diagnosis: Principal Problem: ?  MDD (major depressive disorder), recurrent episode, severe ([place]) ?Active Problems: ?  Insomnia ?  GAD (generalized anxiety disorder) ?  Vascular dementia ([place]) ? ?Total Time spent with patient: 30  minutes ? ?Past Psychiatric History: MDD versus bipolar disorder ?GAD ?Patient reports being hospitalized "maybe once in the past" ?Denies history of suicide attempt ?Current psychiatric medication: Seroquel 200 mg nightly ?Past psychiatric medication history: Patient unsure, per daughter Xanax ?  ? ?Past Medical History:  ?Past Medical History:  ?Diagnosis Date  ? Allergy   ? Anxiety   ? Arthritis   ? Bipolar 1 disorder ([place])   ? Chronic abdominal pain   ? Constipation   ? Depression   ? Heart failure ([place])   ? Hypertension   ? IBS (irritable bowel syndrome)   ? Learning disability   ? Metabolic encephalopathy   ? Neuromuscular disorder ([place])   ? Rhabdomyolysis   ? Sepsis ([place])   ? Stroke [facility])   ?  ?Past Surgical History:  ?Procedure Laterality Date  ? ABDOMINAL HYSTERECTOMY    ? GALLBLADDER SURGERY    ? IR GASTROSTOMY TUBE MOD SED  08/07/2021  ? KNEE SURGERY    ? RIGHT/LEFT HEART CATH AND CORONARY ANGIOGRAPHY N/A 05/04/2020  ? Procedure: RIGHT/LEFT HEART CATH AND CORONARY ANGIOGRAPHY;  Surgeon: Belva Crome, MD;  Location: [place] [facility];  Service: Cardiovascular;  Laterality: N/A;  ? ?Family History:  ?Family History  ?Problem Relation Age of Onset  ? Diabetes Mother   ? Suicidality Mother   ? Heart disease Father   ? Depression Father   ? ?Family Psychiatric  History: Suicidality Mother, Depression Father ?Social History:  ?Social History  ? ?Substance and Sexual Activity  ?Alcohol Use No  ?   ?Social History  ? ?Substance and Sexual Activity  ?Drug Use Yes  ? Types: Other-see comments, Marijuana  ? Comment: opiates  ?  ?Social History  ? ?  Socioeconomic History  ? Marital status: Legally Separated  ?  Spouse name: Not on file  ? Number of children: 2  ? Years of education: Not on file  ? Highest education level: Not on file  ?Occupational History  ? Occupation: DISABLED  ?  Employer: UNEMPLOYED  ?Tobacco Use  ? Smoking status: Never  ? Smokeless tobacco: Never  ?Vaping Use  ? Vaping Use: Never used   ?Substance and Sexual Activity  ? Alcohol use: No  ? Drug use: Yes  ?  Types: Other-see comments, Marijuana  ?  Comment: opiates  ? Sexual activity: Not Currently  ?  Birth control/protection: Surgical  ?Other Topics Concern  ? Not on file  ?Social History Narrative  ? Not on file  ? ?Social Determinants of Health  ? ?Financial Resource Strain: Not on file  ?Food Insecurity: Not on file  ?Transportation Needs: Not on file  ?Physical Activity: Not on file  ?Stress: Not on file  ?Social Connections: Not on file  ? ?Additional Social History:  ?  ?Sleep: Good ? ?Appetite:  Good ? ?Current Medications: ?Current Facility-Administered Medications  ?Medication Dose Route Frequency Provider Last Rate Last Admin  ? acetaminophen (TYLENOL) tablet 650 mg  650 mg Oral Q6H PRN Rankin, Shuvon B, NP      ? alum & mag hydroxide-simeth (MAALOX/MYLANTA) 200-200-20 MG/5ML suspension 30 mL  30 mL Oral Q4H PRN Rankin, Shuvon B, NP      ? apixaban (ELIQUIS) tablet 5 mg  5 mg Oral BID Rankin, Shuvon B, NP   5 mg at 12/08/21 0801  ? atorvastatin (LIPITOR) tablet 80 mg  80 mg Oral Daily Rankin, Shuvon B, NP   80 mg at 12/08/21 0801  ? carvedilol (COREG) tablet 6.25 mg  6.25 mg Oral BID WC Rankin, Shuvon B, NP   6.25 mg at 12/08/21 0800  ? cloNIDine (CATAPRES) tablet 0.1 mg  0.1 mg Oral BID Rankin, Shuvon B, NP   0.1 mg at 12/08/21 0801  ? feeding supplement (ENSURE ENLIVE / ENSURE PLUS) liquid 237 mL  237 mL Oral BID BM Massengill, Nathan, MD   237 mL at 12/07/21 1300  ? furosemide (LASIX) tablet 80 mg  80 mg Oral Daily Massengill, Ovid Curd, MD   80 mg at 12/08/21 0759  ? isosorbide dinitrate (ISORDIL) tablet 10 mg  10 mg Oral TID Rankin, Shuvon B, NP   10 mg at 12/08/21 0800  ? magnesium hydroxide (MILK OF MAGNESIA) suspension 30 mL  30 mL Oral Daily PRN Rankin, Shuvon B, NP      ? melatonin tablet 5 mg  5 mg Oral QHS Rankin, Shuvon B, NP   5 mg at 12/07/21 2209  ? OLANZapine (ZYPREXA) tablet 5 mg  5 mg Oral TID PRN Ajibola, Ene A, NP       ? Or  ? OLANZapine (ZYPREXA) injection 5 mg  5 mg Intramuscular TID PRN Ajibola, Ene A, NP      ? potassium chloride SA (KLOR-CON M) CR tablet 20 mEq  20 mEq Oral BID Janine Limbo, MD   20 mEq at 12/08/21 0800  ? risperiDONE (RISPERDAL M-TABS) disintegrating tablet 0.5 mg  0.5 mg Oral QHS Massengill, Ovid Curd, MD   0.5 mg at 12/07/21 2209  ? risperiDONE (RISPERDAL) tablet 0.5 mg  0.5 mg Oral TID PRN Janine Limbo, MD   0.5 mg at 12/07/21 2209  ? sacubitril-valsartan (ENTRESTO) 24-26 mg per tablet  1 tablet Oral BID Rankin, Shuvon B, NP  1 tablet at 12/08/21 0802  ? sertraline (ZOLOFT) tablet 50 mg  50 mg Oral Daily Massengill, Ovid Curd, MD   50 mg at 12/08/21 0800  ? ? ?Lab Results:  ?Results for orders placed or performed during the hospital encounter of 12/04/21 (from the past 48 hour(s))  ?Glucose, capillary     Status: Abnormal  ? Collection Time: 12/06/21 10:40 PM  ?Result Value Ref Range  ? Glucose-Capillary 159 (H) 70 - 99 mg/dL  ?  Comment: Glucose reference range applies only to samples taken after fasting for at least 8 hours.  ? ? ?Blood Alcohol level:  ?Lab Results  ?Component Value Date  ? ETH <10 12/03/2021  ? ETH <10 07/30/2021  ? ? ?Metabolic Disorder Labs: ?Lab Results  ?Component Value Date  ? HGBA1C 6.4 (H) 07/31/2021  ? MPG 136.98 07/31/2021  ? MPG 114.02 10/21/2018  ? ?No results found for: PROLACTIN ?Lab Results  ?Component Value Date  ? CHOL 211 (H) 07/31/2021  ? TRIG 118 07/31/2021  ? HDL 46 07/31/2021  ? CHOLHDL 4.6 07/31/2021  ? VLDL 24 07/31/2021  ? LDLCALC 141 (H) 07/31/2021  ? LDLCALC 121 (H) 05/04/2020  ? ? ?Physical Findings: ?AIMS:  , ,  ,  ,    ?CIWA:    ?COWS:    ? ?Musculoskeletal: ?Strength & Muscle Tone: within normal limits ?Gait & Station: normal ?Patient leans: N/A ? ?Psychiatric Specialty Exam: ? ?Presentation  ?General Appearance: Appropriate for Environment; Casual; Fairly Groomed ? ?Eye Contact:Good ? ?Speech:Clear and Coherent; Normal Rate ? ?Speech  Volume:Normal ? ?Handedness:Right ? ?Mood and Affect  ?Mood:Anxious; Depressed ? ?Affect:Constricted ? ?Thought Process  ?Thought Processes:Disorganized; Linear ? ?Descriptions of Associations:Intact ? ?Orientation:Full (Time, Place

## 2021-12-08 NOTE — BHH Group Notes (Signed)
Adult Psychoeducational Group Not ?Date:  12/08/2021 ?Time:  5361-4431 ?Group Topic/Focus: PROGRESSIVE RELAXATION. A group where deep breathing is taught and tensing and relaxation muscle groups is used. Imagery is used as well.  Pts are asked to imagine 3 pillars that hold them up when they are not able to hold themselves up and to share that with the group. ? ?Participation Level:  Active ? ?Participation Quality:  Appropriate ? ?Affect:  Appropriate ? ?Cognitive:  Oriented ? ?Insight: Improving ? ?Engagement in Group:  Engaged ? ?Modes of Intervention:  Activity, Discussion, Education, and Support ? ?Additional Comments:  Pt rates her energy at a 7/10 States her family, children and her husband hold her up. ? ?Bryson Dames A ? ?

## 2021-12-08 NOTE — Progress Notes (Signed)
Pt ambulating around unit. Pt compliant with medications this morning, denies SI, HI and AVH. Conversing with peers on unit, patient has attended groups as well. Pt is slightly confused this morning but oriented to person. Pt did have one incident of incontinence this morning. Verbally redirected pt to go to the restroom often to prevent incontinence.  ?

## 2021-12-08 NOTE — Plan of Care (Signed)
  Problem: Education: Goal: Knowledge of [facility] Education information/materials will improve Outcome: Progressing Goal: Emotional status will improve Outcome: Progressing Goal: Mental status will improve Outcome: Progressing Goal: Verbalization of understanding the information provided will improve Outcome: Progressing   

## 2021-12-09 ENCOUNTER — Other Ambulatory Visit: Payer: Self-pay

## 2021-12-09 ENCOUNTER — Inpatient Hospital Stay (HOSPITAL_COMMUNITY): Payer: Medicare HMO

## 2021-12-09 LAB — I-STAT CHEM 8, ED
BUN: 9 mg/dL (ref 6–20)
Calcium, Ion: 1.14 mmol/L — ABNORMAL LOW (ref 1.15–1.40)
Chloride: 100 mmol/L (ref 98–111)
Creatinine, Ser: 0.9 mg/dL (ref 0.44–1.00)
Glucose, Bld: 132 mg/dL — ABNORMAL HIGH (ref 70–99)
HCT: 39 % (ref 36.0–46.0)
Hemoglobin: 13.3 g/dL (ref 12.0–15.0)
Potassium: 3.6 mmol/L (ref 3.5–5.1)
Sodium: 143 mmol/L (ref 135–145)
TCO2: 31 mmol/L (ref 22–32)

## 2021-12-09 LAB — TROPONIN I (HIGH SENSITIVITY): Troponin I (High Sensitivity): 9 ng/L (ref <18)

## 2021-12-09 LAB — GLUCOSE, CAPILLARY: Glucose-Capillary: 156 mg/dL — ABNORMAL HIGH (ref 70–99)

## 2021-12-09 LAB — VITAMIN B1: Vitamin B1 (Thiamine): 116 nmol/L (ref 66.5–200.0)

## 2021-12-09 MED ORDER — MELATONIN 5 MG PO TABS: 5.0000 mg | ORAL_TABLET | Freq: Every day | ORAL | 0 refills | Status: AC

## 2021-12-09 MED ORDER — CARVEDILOL 6.25 MG PO TABS: 6.2500 mg | ORAL_TABLET | Freq: Two times a day (BID) | ORAL | Status: DC

## 2021-12-09 MED ORDER — ISOSORBIDE DINITRATE 10 MG PO TABS: 10.0000 mg | ORAL_TABLET | Freq: Three times a day (TID) | ORAL | Status: DC

## 2021-12-09 MED ORDER — RISPERIDONE 0.5 MG PO TBDP
0.5000 mg | ORAL_TABLET | Freq: Every day | ORAL | 0 refills | Status: DC
Start: 2021-12-09 — End: 2024-05-04

## 2021-12-09 MED ORDER — ATORVASTATIN CALCIUM 80 MG PO TABS: 80.0000 mg | ORAL_TABLET | Freq: Every day | ORAL | Status: AC

## 2021-12-09 MED ORDER — APIXABAN 5 MG PO TABS: 5.0000 mg | ORAL_TABLET | Freq: Two times a day (BID) | ORAL | Status: DC

## 2021-12-09 MED ORDER — SODIUM CHLORIDE 0.9 % IV BOLUS
1000.0000 mL | Freq: Once | INTRAVENOUS | Status: AC
  Administered 2021-12-09: 1000 mL via INTRAVENOUS

## 2021-12-09 MED ORDER — CLONIDINE HCL 0.1 MG PO TABS: 0.1000 mg | ORAL_TABLET | Freq: Two times a day (BID) | ORAL | 11 refills | Status: DC

## 2021-12-09 MED ORDER — RISPERIDONE 0.5 MG PO TBDP: 0.5000 mg | ORAL_TABLET | Freq: Every day | ORAL | 0 refills | Status: DC

## 2021-12-09 MED ORDER — SACUBITRIL-VALSARTAN 24-26 MG PO TABS: 1.0000 | ORAL_TABLET | Freq: Two times a day (BID) | ORAL | Status: DC

## 2021-12-09 MED ORDER — SERTRALINE HCL 50 MG PO TABS
50.0000 mg | ORAL_TABLET | Freq: Every day | ORAL | 0 refills | Status: AC
Start: 2021-12-10 — End: 2024-05-04

## 2021-12-09 MED ORDER — SERTRALINE HCL 50 MG PO TABS: 50.0000 mg | ORAL_TABLET | Freq: Every day | ORAL | 0 refills | Status: DC

## 2021-12-09 NOTE — ED Provider Notes (Signed)
?[facility] DEPT ?Provider Note ? ? ?CSN: [id number] ?Arrival date & time: 12/04/21  1720 ? ?  ? ?History ? ?Chief Complaint  ?Patient presents with  ? MDD  ? Fall  ? ? ?Dorothy Alvarez is a 56 y.o. female. ? ?Patient fell out of the chair at behavioral health.  Patient was being discharged today from behavioral health for her psychiatric problems.  She has a history of hypertension.  Patient fell and hit her head. ? ?The history is provided by the patient and medical records. No language interpreter was used.  ?Fall ?This is a new problem. The current episode started 6 to 12 hours ago. The problem occurs rarely. The problem has been resolved. Pertinent negatives include no chest pain, no abdominal pain and no headaches. Nothing aggravates the symptoms. Nothing relieves the symptoms. She has tried nothing for the symptoms. The treatment provided no relief.  ? ?  ? ?Home Medications ?Prior to Admission medications   ?Medication Sig Start Date End Date Taking? Authorizing Provider  ?acetaminophen (TYLENOL) 325 MG tablet Place 2 tablets (650 mg total) into feeding tube every 4 (four) hours as needed for mild pain (or temp > 37.5 C (99.5 F)). ?Patient not taking: Reported on 12/03/2021 08/14/21   Annita Brod, MD  ?apixaban (ELIQUIS) 5 MG TABS tablet Place 1 tablet (5 mg total) into feeding tube 2 (two) times daily. ?Patient taking differently: Take 5 mg by mouth 2 (two) times daily. 08/14/21   Annita Brod, MD  ?apixaban (ELIQUIS) 5 MG TABS tablet Take 1 tablet (5 mg total) by mouth 2 (two) times daily. 12/09/21   Massengill, Ovid Curd, MD  ?atorvastatin (LIPITOR) 80 MG tablet Place 1 tablet (80 mg total) into feeding tube daily. ?Patient taking differently: Take 80 mg by mouth daily. 08/15/21   Annita Brod, MD  ?atorvastatin (LIPITOR) 80 MG tablet Take 1 tablet (80 mg total) by mouth daily. 12/10/21   Massengill, Ovid Curd, MD  ?carvedilol (COREG) 6.25 MG tablet Place 1 tablet (6.25  mg total) into feeding tube 2 (two) times daily with a meal. ?Patient taking differently: Take 6.25 mg by mouth 2 (two) times daily with a meal. 08/14/21   Annita Brod, MD  ?carvedilol (COREG) 6.25 MG tablet Take 1 tablet (6.25 mg total) by mouth 2 (two) times daily with a meal. 12/09/21   Massengill, Ovid Curd, MD  ?cloNIDine (CATAPRES) 0.1 MG tablet Place 1 tablet (0.1 mg total) into feeding tube 2 (two) times daily. ?Patient taking differently: Take 0.1 mg by mouth 2 (two) times daily. 08/14/21   Annita Brod, MD  ?cloNIDine (CATAPRES) 0.1 MG tablet Take 1 tablet (0.1 mg total) by mouth 2 (two) times daily. 12/09/21   Massengill, Ovid Curd, MD  ?furosemide (LASIX) 80 MG tablet Take 1 tablet (80 mg total) by mouth daily. 05/04/20 07/31/22  Florencia Reasons, MD  ?isosorbide dinitrate (ISORDIL) 10 MG tablet Place 1 tablet (10 mg total) into feeding tube 3 (three) times daily. ?Patient taking differently: Take 10 mg by mouth 3 (three) times daily. 08/14/21   Annita Brod, MD  ?isosorbide dinitrate (ISORDIL) 10 MG tablet Take 1 tablet (10 mg total) by mouth 3 (three) times daily. 12/09/21   Massengill, Ovid Curd, MD  ?melatonin 5 MG TABS Take 1 tablet (5 mg total) by mouth at bedtime. 12/09/21 01/08/22  Massengill, Ovid Curd, MD  ?methylphenidate (RITALIN) 5 MG tablet Place 1 tablet (5 mg total) into feeding tube 2 (two) times daily with breakfast  and lunch. ?Patient not taking: Reported on 12/03/2021 08/14/21   Annita Brod, MD  ?Nutritional Supplements (FEEDING SUPPLEMENT, OSMOLITE 1.5 CAL,) LIQD Place 355 mLs into feeding tube 4 (four) times daily. ?Patient not taking: Reported on 12/03/2021 08/14/21   Annita Brod, MD  ?potassium chloride (KLOR-CON) 20 MEQ packet Place 20 mEq into feeding tube 2 (two) times daily. ?Patient taking differently: Take 20 mEq by mouth 2 (two) times daily. 08/14/21   Annita Brod, MD  ?QUEtiapine (SEROQUEL) 300 MG tablet Take 300 mg by mouth at bedtime. 10/02/21   [provider]  ?risperiDONE (RISPERDAL M-TABS) 0.5 MG disintegrating tablet Take 1 tablet (0.5 mg total) by mouth at bedtime. 12/09/21 01/08/22  Massengill, Ovid Curd, MD  ?sacubitril-valsartan (ENTRESTO) 24-26 MG Take 1 tablet by mouth 2 (two) times daily. 08/14/21   Annita Brod, MD  ?sacubitril-valsartan (ENTRESTO) 24-26 MG Take 1 tablet by mouth 2 (two) times daily. 12/09/21   Massengill, Ovid Curd, MD  ?sertraline (ZOLOFT) 50 MG tablet Take 1 tablet (50 mg total) by mouth daily. 12/10/21 01/09/22  Janine Limbo, MD  ?   ? ?Allergies    ?Ketorolac, Morphine and related, Nsaids, and Trazodone and nefazodone   ? ?Review of Systems   ?Review of Systems  ?Constitutional:  Negative for appetite change and fatigue.  ?HENT:  Negative for congestion, ear discharge and sinus pressure.   ?     Headache  ?Eyes:  Negative for discharge.  ?Respiratory:  Negative for cough.   ?Cardiovascular:  Negative for chest pain.  ?Gastrointestinal:  Negative for abdominal pain and diarrhea.  ?Genitourinary:  Negative for frequency and hematuria.  ?Musculoskeletal:  Negative for back pain.  ?Skin:  Negative for rash.  ?Neurological:  Negative for seizures and headaches.  ?Psychiatric/Behavioral:  Negative for hallucinations.   ? ?Physical Exam ?Updated Vital Signs ?BP 139/82   Pulse (!) 32   Temp 98.8 ?F (37.1 ?C) (Oral)   Resp 17   Ht '5\' 6"'$  (1.676 m)   Wt 85.9 kg   SpO2 100%   BMI 30.57 kg/m?  ?Physical Exam ?Vitals and nursing note reviewed.  ?Constitutional:   ?   Appearance: She is well-developed.  ?HENT:  ?   Head: Normocephalic.  ?   Nose: Nose normal.  ?Eyes:  ?   General: No scleral icterus. ?   Conjunctiva/sclera: Conjunctivae normal.  ?Neck:  ?   Thyroid: No thyromegaly.  ?Cardiovascular:  ?   Rate and Rhythm: Normal rate and regular rhythm.  ?   Heart sounds: No murmur heard. ?  No friction rub. No gallop.  ?Pulmonary:  ?   Breath sounds: No stridor. No wheezing or rales.  ?Chest:  ?   Chest wall: No tenderness.   ?Abdominal:  ?   General: There is no distension.  ?   Tenderness: There is no abdominal tenderness. There is no rebound.  ?Musculoskeletal:     ?   General: Normal range of motion.  ?   Cervical back: Neck supple.  ?Lymphadenopathy:  ?   Cervical: No cervical adenopathy.  ?Skin: ?   Findings: No erythema or rash.  ?Neurological:  ?   Mental Status: She is alert and oriented to person, place, and time.  ?   Motor: No abnormal muscle tone.  ?   Coordination: Coordination normal.  ?Psychiatric:     ?   Behavior: Behavior normal.  ? ? ?ED Results / Procedures / Treatments   ?Labs ?(all labs ordered  are listed, but only abnormal results are displayed) ?Labs Reviewed  ?GLUCOSE, CAPILLARY - Abnormal; Notable for the following components:  ?    Result Value  ? Glucose-Capillary 131 (*)   ? All other components within normal limits  ?GLUCOSE, CAPILLARY - Abnormal; Notable for the following components:  ? Glucose-Capillary 159 (*)   ? All other components within normal limits  ?GLUCOSE, CAPILLARY - Abnormal; Notable for the following components:  ? Glucose-Capillary 156 (*)   ? All other components within normal limits  ?I-STAT CHEM 8, ED - Abnormal; Notable for the following components:  ? Glucose, Bld 132 (*)   ? Calcium, Ion 1.14 (*)   ? All other components within normal limits  ?FOLATE  ?VITAMIN B12  ?VITAMIN D 25 HYDROXY (VIT D DEFICIENCY, FRACTURES)  ?VITAMIN B1  ?TROPONIN I (HIGH SENSITIVITY)  ? ? ?EKG ?None ? ?Radiology ?CT Head Wo Contrast ? ?Result Date: 12/09/2021 ?CLINICAL DATA:  Dizziness, altered mental status EXAM: CT HEAD WITHOUT CONTRAST TECHNIQUE: Contiguous axial images were obtained from the base of the skull through the vertex without intravenous contrast. RADIATION DOSE REDUCTION: This exam was performed according to the departmental dose-optimization program which includes automated exposure control, adjustment of the mA and/or kV according to patient size and/or use of iterative reconstruction  technique. COMPARISON:  12/03/2021, 12/04/2021 FINDINGS: Brain: Stable remote areas of infarct in the left parietal lobe and the right occipital lobe with areas of encephalomalacia. No acute intracranial hemorrhage, new mas

## 2021-12-09 NOTE — Care Management Important Message (Signed)
Medicare IM printed and given to social work to give to the patient. ?

## 2021-12-09 NOTE — Discharge Instructions (Addendum)
Follow-up as instructed by behavioral health 
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
## 2021-12-09 NOTE — Progress Notes (Signed)
?   12/08/21 2100  ?Psych Admission Type (Psych Patients Only)  ?Admission Status Voluntary  ?Psychosocial Assessment  ?Patient Complaints Confusion  ?Eye Contact Glaring  ?Facial Expression Worried  ?Affect Anxious  ?Speech Soft  ?Interaction Assertive  ?Motor Activity Slow  ?Appearance/Hygiene Improved  ?Behavior Characteristics Appropriate to situation  ?Mood Anxious  ?Thought Process  ?Coherency Disorganized  ?Content UTA  ?Delusions None reported or observed  ?Perception WDL  ?Hallucination None reported or observed  ?Judgment Impaired  ?Confusion Moderate  ?Danger to Self  ?Current suicidal ideation? Denies  ?Agreement Not to Harm Self Yes  ?Description of Agreement verbal  ?Danger to Others  ?Danger to Others None reported or observed  ? ? ?

## 2021-12-09 NOTE — Progress Notes (Signed)
?  [facility] Adult Case Management Discharge Plan : ? ?Will you be returning to the same living situation after discharge:  No. Patient will be discharged to the emergency department ?At discharge, do you have transportation home?: Yes,  husband will pick up, Ambulance took her to emergency department ?Do you have the ability to pay for your medications: Yes,  insurance ? ?Release of information consent forms completed and in the chart;  Patient's signature needed at discharge. ? ?Patient to Follow up at: ? Follow-up Information   ? ? [place]. Go to.   ?Specialty: Behavioral Health ?Why: Please go to this provider for medication management services, and interim therapy services on walk in days:  Mondays and Wednesdays at 7:30 am.  Services are provided on a first come, first served basis. ?Contact information: ?[address] ?[place] [address] ?[phone] ? ?  ?  ? ? [facility] [place]. Go on 01/16/2022.   ?Why: You have an appointment for therapy services on 01/16/22 at 6:00 pm.  This appointment will be held in person. ?Contact information: ?[place], [place] ?[place] [address] ?[phone] ? ? ?  ?  ? ?  ?  ? ?  ? ? ?Next level of care provider has access to [place] ? ?Safety Planning and Suicide Prevention discussed: Yes,  Archie, husband ? ?  ? ?Has patient been referred to the Quitline?: N/A patient is not a smoker ? ?Patient has been referred for addiction treatment: N/A ? ?Shaughn Thomley E Maxamillion Banas, LCSW ?12/09/2021, 10:08 AM ?

## 2021-12-09 NOTE — ED Triage Notes (Signed)
Pt brought by [facility] from [name] for a fall that occurred. Nurse at [facility] reports that she has been more sleepy than normal. Pt fell out of her chair, hitting her right elbow, denies hitting her head. Pt is alert and oriented, was getting ready to be discharged home after group therapy.  ?

## 2021-12-09 NOTE — Progress Notes (Signed)
This is an update progress note documenting fall, and to provide information for emergency room evaluation.  ?Formal progress note information will be in the discharge summary on 12-09-2021. ? ?Per staff, pt fell out of chair from seated position to the ground around 9:35am today. It was unclear based if pt did hit head or not (conflicting staff and other patient reports). Based on information, pt appeared to have LOC for 5 seconds or so. There was concern for seizure like activity but this could not be confirmed.  ?Airway, breathing, and circulation intact.  ?Vitals wnl. Blood glucose wnl. EKG performed.  ?Due to pt history of 2x CVA, pt being on eliquis and having fallen without being able to confirm if pt hit head or not, we are sending pt to ED for emergency evaluation. ? ?Psychiatrically, pt is at baseline. No behaviors since admission. Sleep improved. Mood, improved and less depressed since admission. Denies SI, HI. Denies psychotic symptoms.  ?Tolerating medications without reported s/e.  ?A&Ox2 which is baseline (self and location only).  ? ?We had planned to discharge before the fall, as pt is psychiatrically stable. ?Therefore, if pt is not admitted to medical floor from the ED, pt can be dc home from the ED.  ? ?SW to call and update husband about discharge and sending to ED after fall.  ? ?I called the ED provider and updated him on the above information.  ?EMS called for emergency transport to the ED.  ? ?Janine Limbo, MD ?Psychiatrist  ? ?

## 2021-12-09 NOTE — Discharge Summary (Signed)
Physician Discharge Summary Note ? ?Patient:  Dorothy Alvarez is an 56 y.o., female ?MRN:  [id number] ?DOB:  1966/04/11 ?Patient phone:  [phone] (home)  ?Patient address:   ?[address] ?[place] [address],  ?Total Time spent with patient: 30 minutes ? ?Date of Admission:  12/04/2021 ?Date of Discharge: [phone] ? ?Reason for Admission:   ?Patient is a 56 year old female with a psychiatric history major depressive disorder versus bipolar disorder, GAD, 2 strokes, history of benzodiazepine addiction, who was admitted to the psychiatric unit for evaluation and treatment of worsening depression, suicidal statements, and bizarre behaviors. ? ?Principal Problem: MDD (major depressive disorder), recurrent episode, severe ([place]) ?Discharge Diagnoses: Principal Problem: ?  MDD (major depressive disorder), recurrent episode, severe ([place]) ?Active Problems: ?  Insomnia ?  GAD (generalized anxiety disorder) ?  Vascular dementia ([place]) ? ? ?Past Psychiatric History:  ?CVA x2, hypertension ?Denies seizure history ?Surgical history: Hysterectomy, knee surgery ?Allergies -denies ? ?Past Medical History:  ?Past Medical History:  ?Diagnosis Date  ? Allergy   ? Anxiety   ? Arthritis   ? Bipolar 1 disorder ([place])   ? Chronic abdominal pain   ? Constipation   ? Depression   ? Heart failure ([place])   ? Hypertension   ? IBS (irritable bowel syndrome)   ? Learning disability   ? Metabolic encephalopathy   ? Neuromuscular disorder ([place])   ? Rhabdomyolysis   ? Sepsis ([place])   ? Stroke [facility])   ?  ?Past Surgical History:  ?Procedure Laterality Date  ? ABDOMINAL HYSTERECTOMY    ? GALLBLADDER SURGERY    ? IR GASTROSTOMY TUBE MOD SED  08/07/2021  ? KNEE SURGERY    ? RIGHT/LEFT HEART CATH AND CORONARY ANGIOGRAPHY N/A 05/04/2020  ? Procedure: RIGHT/LEFT HEART CATH AND CORONARY ANGIOGRAPHY;  Surgeon: Belva Crome, MD;  Location: [facility];  Service: Cardiovascular;  Laterality: N/A;  ? ?Family History:  ?Family History  ?Problem  Relation Age of Onset  ? Diabetes Mother   ? Suicidality Mother   ? Heart disease Father   ? Depression Father   ? ?Family Psychiatric  History:  ?Patient denies any family history of psychiatric illness or suicide attempt.  ? ? ?Social History:  ?Social History  ? ?Substance and Sexual Activity  ?Alcohol Use No  ?   ?Social History  ? ?Substance and Sexual Activity  ?Drug Use Yes  ? Types: Other-see comments, Marijuana  ? Comment: opiates  ?  ?Social History  ? ?Socioeconomic History  ? Marital status: Legally Separated  ?  Spouse name: Not on file  ? Number of children: 2  ? Years of education: Not on file  ? Highest education level: Not on file  ?Occupational History  ? Occupation: DISABLED  ?  Employer: UNEMPLOYED  ?Tobacco Use  ? Smoking status: Never  ? Smokeless tobacco: Never  ?Vaping Use  ? Vaping Use: Never used  ?Substance and Sexual Activity  ? Alcohol use: No  ? Drug use: Yes  ?  Types: Other-see comments, Marijuana  ?  Comment: opiates  ? Sexual activity: Not Currently  ?  Birth control/protection: Surgical  ?Other Topics Concern  ? Not on file  ?Social History Narrative  ? Not on file  ? ?Social Determinants of Health  ? ?Financial Resource Strain: Not on file  ?Food Insecurity: Not on file  ?Transportation Needs: Not on file  ?Physical Activity: Not on file  ?Stress: Not on file  ?Social Connections: Not  on file  ? ? ?Hospital Course:   ?During the patient's hospitalization, patient had extensive initial psychiatric evaluation, and follow-up psychiatric evaluations every day. ?  ?Psychiatric diagnoses provided upon initial assessment:  ?-Major depressive disorder, severe, recurrent without psychotic features -vs-bipolar disorder.  Although patient has a historical diagnosis of bipolar disorder, it is unclear if patient has had a hypomanic or manic episode in the past.  Current symptoms are predominantly depressive.  We will monitor for affective switching during treatment.  Cognitive symptoms could  be secondary to affective illness i.e. pseudodementia. ?-GAD ?-History of CVA ?-Consideration for vascular dementia.  Consideration for behavioral disturbance.  It is unclear if behaviors patient is demonstrating are secondary to secondary gain, manipulation, affective illness, or dementia. ?-History of sedative hypnotic use disorder ?  ?Patient's psychiatric medications were adjusted on admission:  ?-Start Zoloft 25 mg once daily for MDD and GAD.  Increase to 50 mg once daily tomorrow.   ?-Start Risperdal 0.5 mg at bedtime.  For mood stability in context of historical diagnosis of bipolar disorder. This medication will also help with impulsivity and behavioral disturbance of secondary to dementia. ?This medication is much less anticholinergic than Seroquel. ?-I considered starting rivastigmine for neuropsychiatric symptoms in the setting of vascular dementia.  There is debate whether cognitive enhancing medications are effective and vascular dementia.  Per [facility] prescribing guidelines and psychiatry page 576, rivastigmine has shown positive results for neuropsychiatric symptoms associated with vascular and Lewy body dementia.   ?-Patient has allergy to trazodone, and will not start this medication now. ?-Start melatonin ?  ?During the hospitalization, other adjustments were made to the patient's psychiatric medication regimen:  ?-zoloft and risperdal continued ?  ?Gradually, patient started adjusting to milieu.   ?Patient's care was discussed during the interdisciplinary team meeting every day during the hospitalization. ?  ?The patient denies having side effects to prescribed psychiatric medication. ?  ?The patient reports their target psychiatric symptoms of depression, behavioral disturbances, suicidal thoughts, all responded well to the psychiatric medications, and the patient reports overall benefit other psychiatric hospitalization. Supportive psychotherapy was provided to the patient. The patient also  participated in regular group therapy while admitted.  ?  ?Labs were reviewed with the patient, and abnormal results were discussed with the patient. ?  ?The patient denied having suicidal thoughts more than 48 hours prior to discharge.  Patient denies having homicidal thoughts.  Patient denies having auditory hallucinations.  Patient denies any visual hallucinations.  Patient denies having paranoid thoughts. ?  ?It is recommended to the patient to continue psychiatric medications as prescribed, after discharge from the hospital.   ?  ?It is recommended to the patient to follow up with your outpatient psychiatric provider and PCP. ?  ?Discussed with the patient, the impact of alcohol, drugs, tobacco have been there overall psychiatric and medical wellbeing, and total abstinence from substance use was recommended the patient. ?  ?On the day of discharge 12-09-21: ?Per staff, pt fell out of chair from seated position to the ground around 9:35am today. It was unclear based if pt did hit head or not (conflicting staff and other patient reports). Based on information, pt appeared to have LOC for 5 seconds or so. There was concern for seizure like activity but this could not be confirmed. Airway, breathing, and circulation intact. Vitals wnl. Blood glucose wnl. EKG performed.  ?Due to pt history of 2x CVA, pt being on eliquis and having fallen without being able to confirm if  pt hit head or not, we are sending pt to ED for emergency evaluation. ?  ?Psychiatrically, pt is at baseline. No behaviors since admission. Sleep improved. Mood, improved and less depressed since admission. Denies SI, HI. Denies psychotic symptoms. Tolerating medications without reported s/e. A&Ox2 which is baseline (self and location only).  ?  ?We had planned to discharge before the fall, as pt is psychiatrically stable. ?Therefore, if pt is not admitted to medical floor from the ED, pt can be dc home from the ED. SW to call and update husband about  discharge and sending to ED after fall. I called the ED provider and updated him on the above information. EMS was called for emergency transport to the ED.  ? ? ?Physical Findings: ?AIMS:  , ,  ,  ,    ?CIWA

## 2021-12-24 ENCOUNTER — Encounter: Payer: Self-pay | Admitting: *Deleted

## 2021-12-27 ENCOUNTER — Encounter: Payer: Self-pay | Admitting: Diagnostic Neuroimaging

## 2021-12-27 ENCOUNTER — Ambulatory Visit (INDEPENDENT_AMBULATORY_CARE_PROVIDER_SITE_OTHER): Payer: Medicare HMO | Admitting: Diagnostic Neuroimaging

## 2021-12-27 VITALS — BP 95/62 | HR 76 | Ht 62.0 in | Wt 186.4 lb

## 2021-12-27 DIAGNOSIS — I5022 Chronic systolic (congestive) heart failure: Secondary | ICD-10-CM

## 2021-12-27 DIAGNOSIS — I63432 Cerebral infarction due to embolism of left posterior cerebral artery: Secondary | ICD-10-CM

## 2021-12-27 NOTE — Progress Notes (Signed)
? ?[facility] ? ?PATIENT: Dorothy Alvarez ?DOB: [birth year] ? ?REFERRING CLINICIAN: Nolene Ebbs, MD ?HISTORY FROM: patient and husband ?REASON FOR VISIT: new consult ? ? ?HISTORICAL ? ?CHIEF COMPLAINT:  ?Chief Complaint  ?Patient presents with  ? New Patient (Initial Visit)  ?  Pt in 7 with husband and daughter  pt is here for stroke follow up pt states she had 2 strokes . Pt states she still has tingling in right arm and face   ? ? ?HISTORY OF PRESENT ILLNESS:  ? ?56 year old female here for evaluation of stroke follow-up.  History of hypertension, chronic pain, anxiety, depression.   ? ?Patient presented to hospital on 07/30/2021 with altered mental status and lethargy.  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  MRI revealed acute left PCA ischemic infarction possibly embolic.  Punctate areas of bilateral cerebral and right cerebellar, right thalamus and left frontoparietal region strokes also were noted.  Ejection fraction of 20% was noted.  Patient was discharged on Eliquis anticoagulation due to low EF.  Patient was also dealing with UTI sepsis hypertension, hyperlipidemia, soft tissue mass of the abdomen and other issues.  Patient went to home with home health agency referral. ? ?Since that time patient has gradually improved.  Still has some numbness and tingling of the right side. ? ? ?REVIEW OF SYSTEMS: Full 14 system review of systems performed and negative with exception of: as per HPI. ? ?ALLERGIES: ?Allergies  ?Allergen Reactions  ? Ketorolac Other (See Comments)  ?  Anxiety and confusion  ? Morphine And Related Other (See Comments)  ?  Mental status changes (confusion and anxiety)  ? Nsaids Nausea Only  ? Trazodone And Nefazodone Other (See Comments)  ?  Restlessness and opposite/paradoxical reaction/effect  ? ? ?HOME MEDICATIONS: ?Outpatient Medications Prior to Visit  ?Medication Sig Dispense Refill  ? apixaban (ELIQUIS) 5 MG TABS tablet Place 1 tablet (5  mg total) into feeding tube 2 (two) times daily. (Patient taking differently: Take 5 mg by mouth 2 (two) times daily.) 60 tablet 1  ? apixaban (ELIQUIS) 5 MG TABS tablet Take 1 tablet (5 mg total) by mouth 2 (two) times daily. 60 tablet   ? atorvastatin (LIPITOR) 80 MG tablet Place 1 tablet (80 mg total) into feeding tube daily. (Patient taking differently: Take 80 mg by mouth daily.) 30 tablet 1  ? atorvastatin (LIPITOR) 80 MG tablet Take 1 tablet (80 mg total) by mouth daily.    ? carvedilol (COREG) 6.25 MG tablet Place 1 tablet (6.25 mg total) into feeding tube 2 (two) times daily with a meal. (Patient taking differently: Take 6.25 mg by mouth 2 (two) times daily with a meal.) 60 tablet 1  ? carvedilol (COREG) 6.25 MG tablet Take 1 tablet (6.25 mg total) by mouth 2 (two) times daily with a meal.    ? cloNIDine (CATAPRES) 0.1 MG tablet Place 1 tablet (0.1 mg total) into feeding tube 2 (two) times daily. (Patient taking differently: Take 0.1 mg by mouth 2 (two) times daily.) 60 tablet 11  ? cloNIDine (CATAPRES) 0.1 MG tablet Take 1 tablet (0.1 mg total) by mouth 2 (two) times daily. 60 tablet 11  ? furosemide (LASIX) 80 MG tablet Take 1 tablet (80 mg total) by mouth daily. 30 tablet 0  ? isosorbide dinitrate (ISORDIL) 10 MG tablet Place 1 tablet (10 mg total) into feeding tube 3 (three) times daily. (Patient taking differently: Take 10 mg by mouth 3 (three) times daily.) 90  tablet 1  ? isosorbide dinitrate (ISORDIL) 10 MG tablet Take 1 tablet (10 mg total) by mouth 3 (three) times daily.    ? melatonin 5 MG TABS Take 1 tablet (5 mg total) by mouth at bedtime. 30 tablet 0  ? potassium chloride (KLOR-CON) 20 MEQ packet Place 20 mEq into feeding tube 2 (two) times daily. (Patient taking differently: Take 20 mEq by mouth 2 (two) times daily.) 60 each 2  ? risperiDONE (RISPERDAL M-TABS) 0.5 MG disintegrating tablet Take 1 tablet (0.5 mg total) by mouth at bedtime. 30 tablet 0  ? sacubitril-valsartan (ENTRESTO) 24-26 MG  Take 1 tablet by mouth 2 (two) times daily. 60 tablet 1  ? sacubitril-valsartan (ENTRESTO) 24-26 MG Take 1 tablet by mouth 2 (two) times daily. 60 tablet   ? sertraline (ZOLOFT) 50 MG tablet Take 1 tablet (50 mg total) by mouth daily. 30 tablet 0  ? acetaminophen (TYLENOL) 325 MG tablet Place 2 tablets (650 mg total) into feeding tube every 4 (four) hours as needed for mild pain (or temp > 37.5 C (99.5 F)).    ? methylphenidate (RITALIN) 5 MG tablet Place 1 tablet (5 mg total) into feeding tube 2 (two) times daily with breakfast and lunch. 60 tablet 0  ? Nutritional Supplements (FEEDING SUPPLEMENT, OSMOLITE 1.5 CAL,) LIQD Place 355 mLs into feeding tube 4 (four) times daily. 42600 mL 5  ? QUEtiapine (SEROQUEL) 300 MG tablet Take 300 mg by mouth at bedtime.    ? ?No facility-administered medications prior to visit.  ? ? ?PAST MEDICAL HISTORY: ?Past Medical History:  ?Diagnosis Date  ? Allergy   ? Anxiety   ? Arthritis   ? Bipolar 1 disorder ([place])   ? Chronic abdominal pain   ? Constipation   ? Depression   ? Heart failure ([place])   ? Hypertension   ? IBS (irritable bowel syndrome)   ? Learning disability   ? MDD (major depressive disorder)   ? Metabolic encephalopathy   ? Neuromuscular disorder ([place])   ? Rhabdomyolysis   ? Sepsis ([place])   ? Stroke [facility])   ? ? ?PAST SURGICAL HISTORY: ?Past Surgical History:  ?Procedure Laterality Date  ? ABDOMINAL HYSTERECTOMY    ? GALLBLADDER SURGERY    ? IR GASTROSTOMY TUBE MOD SED  08/07/2021  ? KNEE SURGERY    ? RIGHT/LEFT HEART CATH AND CORONARY ANGIOGRAPHY N/A 05/04/2020  ? Procedure: RIGHT/LEFT HEART CATH AND CORONARY ANGIOGRAPHY;  Surgeon: Belva Crome, MD;  Location: [facility];  Service: Cardiovascular;  Laterality: N/A;  ? ? ?FAMILY HISTORY: ?Family History  ?Problem Relation Age of Onset  ? Diabetes Mother   ? Suicidality Mother   ? Transient ischemic attack Mother   ? Heart disease Father   ? Depression Father   ? Stroke Father   ? ? ?SOCIAL HISTORY: ?Social History   ? ?Socioeconomic History  ? Marital status: Legally Separated  ?  Spouse name: Not on file  ? Number of children: 2  ? Years of education: Not on file  ? Highest education level: Not on file  ?Occupational History  ? Occupation: DISABLED  ?  Employer: UNEMPLOYED  ?Tobacco Use  ? Smoking status: Never  ? Smokeless tobacco: Never  ?Vaping Use  ? Vaping Use: Never used  ?Substance and Sexual Activity  ? Alcohol use: No  ? Drug use: Yes  ?  Types: Other-see comments, Marijuana  ?  Comment: opiates  ? Sexual activity: Not Currently  ?  Birth control/protection: Surgical  ?Other Topics Concern  ? Not on file  ?Social History Narrative  ? Not on file  ? ?Social Determinants of Health  ? ?Financial Resource Strain: Not on file  ?Food Insecurity: Not on file  ?Transportation Needs: Not on file  ?Physical Activity: Not on file  ?Stress: Not on file  ?Social Connections: Not on file  ?Intimate Partner Violence: Not on file  ? ? ? ?PHYSICAL EXAM ? ?GENERAL EXAM/CONSTITUTIONAL: ?Vitals:  ?Vitals:  ? 12/27/21 1100  ?BP: 95/62  ?Pulse: 76  ?Weight: 186 lb 6.4 oz (84.6 kg)  ?Height: '5\' 2"'$  (1.575 m)  ? ?Body mass index is 34.09 kg/m?. ?Wt Readings from Last 3 Encounters:  ?12/27/21 186 lb 6.4 oz (84.6 kg)  ?10/08/21 216 lb 0.8 oz (98 kg)  ?08/13/21 215 lb 13.3 oz (97.9 kg)  ? ?Patient is in no distress; well developed, nourished and groomed; neck is supple ? ?CARDIOVASCULAR: ?Examination of carotid arteries is normal; no carotid bruits ?Regular rate and rhythm, no murmurs ?Examination of peripheral vascular system by observation and palpation is normal ? ?EYES: ?Ophthalmoscopic exam of optic discs and posterior segments is normal; no papilledema or hemorrhages ?No results found. ? ?MUSCULOSKELETAL: ?Gait, strength, tone, movements noted in Neurologic exam below ? ?NEUROLOGIC: ?MENTAL STATUS:  ?   ? View : No data to display.  ?  ?  ?  ? ?awake, alert, oriented to person, place and time ?recent and remote memory intact ?normal  attention and concentration ?language fluent, comprehension intact, naming intact ?fund of knowledge appropriate ?Decr fluency; decr attention ? ?CRANIAL NERVE:  ?2nd - no papilledema on fundoscopic exam ?

## 2021-12-31 NOTE — Progress Notes (Unsigned)
Cardiology Office Note:    Date:  01/09/2022   ID:  [name], DOB May 14, 1966, MRN [id number]  PCP:  Nolene Ebbs, MD  Cardiologist:  Sanda Klein, MD  Electrophysiologist:  None   Referring MD: Penni Bombard, MD   Chief Complaint: follow-up of CHF  History of Present Illness:    Dorothy Alvarez is a 56 y.o. female with a history of normal coronary arteries on cardiac catheterization in 04/2020, non-ischemic cardiomyopathy/ chronic systolic CHF with EF of 38% on most recent Echo in 07/2021, multiple strokes, hypertension, hyperlipidemia, left renal mass noted on CT in 09/2021, possible bipolar disorder, depression/anxiety, and vascular dementia who is a patient of Dr. Sallyanne Kuster and presents today for follow-up of CHF.   Patient was admitted in 04/2020 with new onset CHF after presenting with dyspnea on exertion, orthopnea, and lower extremity edema. Echo showed LVEF of 25-30% with global hypokinesis, mild LVH, grade III diastolic dysfunction as well as mild MR and mildly elevated PASP. She underwent R/LHC which showed widely patent coronary arteries and moderate pulmonary hypertension (WHO Group II). She was diuresed and started on GDMT. Outpatient sleep study was recommended. She was lost to follow-up after this hospitalization and has a 100% no show rate in our office.  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  Brain MRI showed diffusion defect concerning for acute left PCA infarct. Further work-up by Neurology revealed bilateral multifocal stroke with infarcts in the left PCA, right cerebellum, bilateral frontal and right thalamus. Echo showed LVEF of at best 20% with global hypokinesis and mild LVH with LV trabeculation but no thrombus. RV normal in size with moderately reduced RV systolic function and severely elevated PASP of 65.7 mmHg. She required PEG tube due to dysphagia from stroke. She was was  started on Eliquis and a high-intensity statin. Initial plan was for TEE during admission to rule out endocarditis but patient was not felt stable enough for this given she was unresponsive and requiring restraints. There was concern that she would not be able to protect her airway and would require intubation. Cardiology was not formally consulted during this admission but she did require IV diuresis. She was never seen by Cardiology after this admission either.  She was most recently admitted to [facility] in 11/2021 after presenting with suicidal ideation and worsening depressive symptoms. [name], she fell out of a chair from the seated position. There was concern that she had some LOC for about 5 seconds and some seizure like activity so she was sent to the ED. Head CT showed no acute findings and was felt to be stable for discharge.  Patient presents today for follow-up. Here with daughter. She is dong well from a cardiac standpoint. She denies any chest pain, shortness of breath, orthopnea, PND, lower extremity edema, palpitations, lightheadedness, dizziness, or syncope. No significant deficits from recent stroke other than some short-term memory issues at times per daughter. Patient is compliant with all medications and is tolerating them well.  Patient was recently seen by Neurology who recommended patient see [place] to see if she could come off Eliquis. Per their office visit note on 12/27/2021, "if EF increased >30%, then can change Eliquis back to Aspirin '81mg'$  daily."  Past Medical History:  Diagnosis Date   Allergy    Anxiety    Arthritis    Bipolar 1 disorder ([place])  Chronic abdominal pain    Constipation    Depression    Heart failure (HCC)    Hypertension    IBS (irritable bowel syndrome)    Learning disability    MDD (major depressive disorder)    Metabolic encephalopathy    Neuromuscular disorder (HCC)    Rhabdomyolysis     Sepsis ([place])    Stroke [facility])     Past Surgical History:  Procedure Laterality Date   ABDOMINAL HYSTERECTOMY     GALLBLADDER SURGERY     IR GASTROSTOMY TUBE MOD SED  08/07/2021   KNEE SURGERY     RIGHT/LEFT HEART CATH AND CORONARY ANGIOGRAPHY N/A 05/04/2020   Procedure: RIGHT/LEFT HEART CATH AND CORONARY ANGIOGRAPHY;  Surgeon: Belva Crome, MD;  Location: [place] [facility];  Service: Cardiovascular;  Laterality: N/A;    Current Medications: Current Meds  Medication Sig   apixaban (ELIQUIS) 5 MG TABS tablet Take 1 tablet (5 mg total) by mouth 2 (two) times daily.   atorvastatin (LIPITOR) 80 MG tablet Take 1 tablet (80 mg total) by mouth daily.   carvedilol (COREG) 6.25 MG tablet Take 1 tablet (6.25 mg total) by mouth 2 (two) times daily with a meal.   furosemide (LASIX) 80 MG tablet Take 1 tablet (80 mg total) by mouth daily.   isosorbide dinitrate (ISORDIL) 10 MG tablet Take 1 tablet (10 mg total) by mouth 3 (three) times daily.   [EXPIRED] melatonin 5 MG TABS Take 1 tablet (5 mg total) by mouth at bedtime.   Nutritional Supplements (FEEDING SUPPLEMENT, OSMOLITE 1.5 CAL,) LIQD Place 355 mLs into feeding tube 4 (four) times daily.   risperiDONE (RISPERDAL M-TABS) 0.5 MG disintegrating tablet Take 1 tablet (0.5 mg total) by mouth at bedtime.   sacubitril-valsartan (ENTRESTO) 24-26 MG Take 1 tablet by mouth 2 (two) times daily.   sacubitril-valsartan (ENTRESTO) 24-26 MG Take 1 tablet by mouth 2 (two) times daily.   sertraline (ZOLOFT) 50 MG tablet Take 1 tablet (50 mg total) by mouth daily.   [DISCONTINUED] cloNIDine (CATAPRES) 0.1 MG tablet Take 1 tablet (0.1 mg total) by mouth 2 (two) times daily.     Allergies:   Ketorolac, Morphine and related, Nsaids, and Trazodone and nefazodone   Social History   Socioeconomic History   Marital status: Legally Separated    Spouse name: Not on file   Number of children: 2   Years of education: Not on file   Highest education level: Not on  file  Occupational History   Occupation: DISABLED    Employer: UNEMPLOYED  Tobacco Use   Smoking status: Never   Smokeless tobacco: Never  Vaping Use   Vaping Use: Never used  Substance and Sexual Activity   Alcohol use: No   Drug use: Yes    Types: Other-see comments, Marijuana    Comment: opiates   Sexual activity: Not Currently    Birth control/protection: Surgical  Other Topics Concern   Not on file  Social History Narrative   Not on file   Social Determinants of Health   Financial Resource Strain: Not on file  Food Insecurity: Not on file  Transportation Needs: Not on file  Physical Activity: Not on file  Stress: Not on file  Social Connections: Not on file     Family History: The patient's family history includes Depression in her father; Diabetes in her mother; Heart disease in her father; Stroke in her father; Suicidality in her mother; Transient ischemic attack in her mother.  ROS:   Please see the history of present illness.     EKGs/Labs/Other Studies Reviewed:    The following studies were reviewed today:  Right/Left Cardiac Catheterization 05/04/2020: Widely patent coronary arteries. Acute on chronic combined systolic and diastolic heart failure with mean wedge 18 mmHg and LVEDP 20 mmHg Moderate pulmonary hypertension, mean PAP 35 mmHg; . WHO Group II (Hemodynamic classification: Pulmonary capillary wedge pressure>15; pulmonary vascular resistance > 3 WU)   Recommendations: Guideline directed therapy for systolic heart failure. Consider obstructive sleep apnea as a contributor to pulmonary hypertension given the elevated vascular resistance.  Diagnostic Dominance: Right  _______________  Echocardiogram 07/31/2021: Impressions:  1. Left ventricular ejection fraction, by estimation, is at best 20%. The  left ventricle has severely decreased function. The left ventricle  demonstrates global hypokinesis. There is mild left ventricular  hypertrophy.  Left ventricular diastolic  parameters are indeterminate. There is LV trabeculation without thrombus.   2. Right ventricular systolic function is moderately reduced. The right  ventricular size is normal. There is severely elevated pulmonary artery  systolic pressure. The estimated right ventricular systolic pressure is  18.2 mmHg.   3. Left atrial size was mildly dilated.   4. The mitral valve is normal in structure. Mild mitral valve  regurgitation. No evidence of mitral stenosis.   5. Tricuspid valve regurgitation is moderate to severe.   6. The aortic valve is tricuspid. There is mild thickening of the aortic  valve. Aortic valve regurgitation is not visualized. Aortic valve  sclerosis is present, with no evidence of aortic valve stenosis.   7. The inferior vena cava is dilated in size with <50% respiratory  variability, suggesting right atrial pressure of 15 mmHg.   Comparison(s): A prior study was performed on 05/02/2020. Further decrease in LV function.  _______________  Carotid Ultrasounds 08/01/2021: Summary:  - Right Carotid: Velocities in the right ICA are consistent with a 1-39%  stenosis.  - Left Carotid: Velocities in the left ICA are consistent with a 1-39%  stenosis.  - Vertebrals:  Bilateral vertebral arteries demonstrate antegrade flow.  - Subclavians: Normal flow hemodynamics were seen in bilateral subclavian arteries.   EKG:  EKG ordered today.EKG personally reviewed and demonstrates normal sinus rhythm, rate 62 bpm, with Q waves in leads III and aVF and non-specific T wave changes. Left axis deviation. Normal PR and QRS interval. QTc 401 ms.  Recent Labs: 08/08/2021: Magnesium 2.3 08/11/2021: B Natriuretic Peptide 631.3 12/03/2021: ALT 24; Platelets 293; TSH 2.284 12/09/2021: BUN 9; Creatinine, Ser 0.90; Hemoglobin 13.3; Potassium 3.6; Sodium 143  Recent Lipid Panel    Component Value Date/Time   CHOL 211 (H) 07/31/2021 0344   TRIG 118 07/31/2021 0344   HDL  46 07/31/2021 0344   CHOLHDL 4.6 07/31/2021 0344   VLDL 24 07/31/2021 0344   LDLCALC 141 (H) 07/31/2021 0344    Physical Exam:    Vital Signs: BP 102/64   Pulse 62   Ht '5\' 2"'$  (1.575 m)   Wt 186 lb 9.6 oz (84.6 kg)   SpO2 98%   BMI 34.13 kg/m     Wt Readings from Last 3 Encounters:  01/08/22 186 lb 9.6 oz (84.6 kg)  12/27/21 186 lb 6.4 oz (84.6 kg)  10/08/21 216 lb 0.8 oz (98 kg)     General: 56 y.o. African-American female in no acute distress. HEENT: Normocephalic and atraumatic. Sclera clear. EOMs intact. Neck: Supple. No JVD. Heart: RRR. Distinct S1 and S2. No murmurs, gallops, or rubs.  Radial  pulses 2+ and equal bilaterally. Lungs: No increased work of breathing. Clear to ausculation bilaterally. No wheezes, rhonchi, or rales.  Abdomen: Soft, non-distended, and non-tender to palpation.  Extremities: No lower extremity edema.    Skin: Warm and dry. Neuro: Alert and oriented x3. No focal deficits. Psych: Normal affect. Responds appropriately.  Assessment:    1. Non-ischemic cardiomyopathy ([place])   2. Chronic combined systolic and diastolic CHF (congestive heart failure) ([place])   3. Primary hypertension   4. Hyperlipidemia, unspecified hyperlipidemia type   5. History of stroke     Plan:    Non-Ischemic Cardiomyopathy Chronic Combined CHF Initially diagnosed in 04/2020 when found to have LVEF of 25-30%. LHC at that time showed normal coronaries. Last Echo in 07/2021 showed LVEF of at best 20% with global hypokinesis and mild LVH with LV trabeculation but no thrombus. RV normal in size with moderately reduced RV systolic function and severely elevated PASP of 65.7 mmHg. - Euvolemic on exam.  - Continue Lasix '80mg'$  daily. - Continue Entresto 24-'26mg'$  twice daily. - Continue Coreg 6.'25mg'$  twice daily.  - Continue Isordil '10mg'$  three times daily. - Discussed adding [place] but patient was admitted with septic shock from emphysematous cystitis back in 07/2021 and cannot  remember if she has a history of recurrent UTIs before this so will hold off on this for now. - Will wean patient off Clonidine so that we can further optimize GDMT. Will try to add Spironolactone if BP will allow once she is off Clonidine. - Will repeat Echo to reassess LV function. If EF still <30%, will refer to EP for consideration of ICD. - Will discuss with MD about whether we should get cardiac MRI for further evaluation of her cardiomyopathy given severely reduced EF.  Hypertension BP soft but stable. - Continue medications for CHF as above. Also on Clonidine 0.1 mg twice daily. Will wean off Clonidine so that we can optimize GDMT for her CHF - will decrease to 0.'1mg'$  once daily at night for 1 week and then she can completely stop this.  Hyperlipidemia Lipid panel as high as 217 in 2020. Most recent lipid panel in 07/2021 at time of stroke: Total Cholesterol 211, Triglycerides 118, HDL 46, LDL 141.  - Started on Lipitor '80mg'$  daily at time of stroke. Continue. - Needs repeat lipid panel and LFTs but did not have time to discuss this today. Can discuss at follow-up visit.   Stroke Patient was admitted in 07/2021 with acute stroke and was found to have bilateral multifocal stroke with infarcts in the left PCA, right cerebellum, bilateral frontal and right thalamus. TTE showed LVEF of at best 20% with LV trabeculation but no thrombus. TEE was initially planned to rule out endocarditis but was not performed due to concern that patient would not be able to protect her airway and would require intubation. Neurology recommended Eliquis at that time given low EF with plans to switch back to antiplatelet therapy once EF >30%. - Continue Eliquis '5mg'$  twice daily. - Continue high-intensity statin. - Will repeat Echo as above. Discussed TEE vs TTE and patient preferred TTE.  - Also may need to consider Event Monitor to rule out atrial fibrillation given patient did not have this done following stroke.  Can discuss more at follow-up visit.    Disposition: Follow up with me in 2 weeks for further optimization of GDMT.    Medication Adjustments/Labs and Tests Ordered: Current medicines are reviewed at length with the patient today.  Concerns regarding medicines are outlined above.  Orders Placed This Encounter  Procedures   EKG 12-Lead   ECHOCARDIOGRAM COMPLETE   No orders of the defined types were placed in this encounter.   Patient Instructions  Medication Instructions:  Wean off clonidine to 0.1 mg in the evening for 1 week, then stop taking it.  *If you need a refill on your cardiac medications before your next appointment, please call your pharmacy*   Testing/Procedures: Your physician has requested that you have an echocardiogram. Echocardiography is a painless test that uses sound waves to create images of your heart. It provides your doctor with information about the size and shape of your heart and how well your heart's chambers and valves are working. This procedure takes approximately one hour. There are no restrictions for this procedure.   Follow-Up: At [facility], you and your health needs are our priority.  As part of our continuing mission to provide you with exceptional heart care, we have created designated Provider Care Teams.  These Care Teams include your primary Cardiologist (physician) and Advanced Practice Providers (APPs -  Physician Assistants and Nurse Practitioners) who all work together to provide you with the care you need, when you need it.  We recommend signing up for the patient portal called "MyChart".  Sign up information is provided on this After Visit Summary.  MyChart is used to connect with patients for Virtual Visits (Telemedicine).  Patients are able to view lab/test results, encounter notes, upcoming appointments, etc.  Non-urgent messages can be sent to your provider as well.   To learn more about what you can do with MyChart, go to  [email or web address].    Your next appointment:    Wednesday January 22, 2022 at 1:55 pm  The format for your next appointment:   In Person  Provider:   Sande Rives, PA-C          Signed, Darreld Mclean, PA-C  01/09/2022 1:08 AM    [place]

## 2022-01-08 ENCOUNTER — Ambulatory Visit (INDEPENDENT_AMBULATORY_CARE_PROVIDER_SITE_OTHER): Payer: Medicare HMO | Admitting: Student

## 2022-01-08 ENCOUNTER — Encounter: Payer: Self-pay | Admitting: Student

## 2022-01-08 VITALS — BP 102/64 | HR 62 | Ht 62.0 in | Wt 186.6 lb

## 2022-01-08 DIAGNOSIS — I10 Essential (primary) hypertension: Secondary | ICD-10-CM | POA: Diagnosis not present

## 2022-01-08 DIAGNOSIS — I428 Other cardiomyopathies: Secondary | ICD-10-CM | POA: Diagnosis not present

## 2022-01-08 DIAGNOSIS — I5042 Chronic combined systolic (congestive) and diastolic (congestive) heart failure: Secondary | ICD-10-CM | POA: Diagnosis not present

## 2022-01-08 DIAGNOSIS — Z8673 Personal history of transient ischemic attack (TIA), and cerebral infarction without residual deficits: Secondary | ICD-10-CM

## 2022-01-08 DIAGNOSIS — E785 Hyperlipidemia, unspecified: Secondary | ICD-10-CM | POA: Diagnosis not present

## 2022-01-08 NOTE — Patient Instructions (Signed)
Medication Instructions:  Wean off clonidine to 0.1 mg in the evening for 1 week, then stop taking it.  *If you need a refill on your cardiac medications before your next appointment, please call your pharmacy*   Testing/Procedures: Your physician has requested that you have an echocardiogram. Echocardiography is a painless test that uses sound waves to create images of your heart. It provides your doctor with information about the size and shape of your heart and how well your heart's chambers and valves are working. This procedure takes approximately one hour. There are no restrictions for this procedure.   Follow-Up: At [facility], you and your health needs are our priority.  As part of our continuing mission to provide you with exceptional heart care, we have created designated Provider Care Teams.  These Care Teams include your primary Cardiologist (physician) and Advanced Practice Providers (APPs -  Physician Assistants and Nurse Practitioners) who all work together to provide you with the care you need, when you need it.  We recommend signing up for the patient portal called "MyChart".  Sign up information is provided on this After Visit Summary.  MyChart is used to connect with patients for Virtual Visits (Telemedicine).  Patients are able to view lab/test results, encounter notes, upcoming appointments, etc.  Non-urgent messages can be sent to your provider as well.   To learn more about what you can do with MyChart, go to [email or web address].    Your next appointment:    Wednesday January 22, 2022 at 1:55 pm  The format for your next appointment:   In Person  Provider:   Sande Rives, PA-C

## 2022-01-09 ENCOUNTER — Encounter: Payer: Self-pay | Admitting: Student

## 2022-01-09 NOTE — Progress Notes (Signed)
Agree with both MRI and referral for ICD if EF still <35%.

## 2022-01-12 NOTE — Progress Notes (Deleted)
Cardiology Office Note:    Date:  01/12/2022   ID:  [name], DOB [birth year], MRN [id number]  PCP:  Nolene Ebbs, MD  Cardiologist:  Sanda Klein, MD  Electrophysiologist:  None   Referring MD: Nolene Ebbs, MD   Chief Complaint: follow-up of CHF  History of Present Illness:    Dorothy Alvarez is a 56 y.o. female with a history of normal coronary arteries on cardiac catheterization in 04/2020, non-ischemic cardiomyopathy/ chronic systolic CHF with EF of 78% on most recent Echo in 07/2021, multiple strokes, hypertension, hyperlipidemia, left renal mass noted on CT in 09/2021, possible bipolar disorder, depression/anxiety, and vascular dementia who is a patient of Dr. Sallyanne Kuster and presents today for follow-up of CHF.    Patient was admitted in 04/2020 with new onset CHF after presenting with dyspnea on exertion, orthopnea, and lower extremity edema. Echo showed LVEF of 25-30% with global hypokinesis, mild LVH, grade III diastolic dysfunction as well as mild MR and mildly elevated PASP. She underwent R/LHC which showed widely patent coronary arteries and moderate pulmonary hypertension (WHO Group II). She was diuresed and started on GDMT. Outpatient sleep study was recommended. She was lost to follow-up after this hospitalization and has a 100% no show rate in our office.   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  Brain MRI showed diffusion defect concerning for acute left PCA infarct. Further work-up by Neurology revealed bilateral multifocal stroke with infarcts in the left PCA, right cerebellum, bilateral frontal and right thalamus. Echo showed LVEF of at best 20% with global hypokinesis and mild LVH with LV trabeculation but no thrombus. RV normal in size with moderately reduced RV systolic function and severely elevated PASP of 65.7 mmHg. She required PEG tube due to dysphagia from stroke. She was was  started on Eliquis and a high-intensity statin. Initial plan was for TEE during admission to rule out endocarditis but patient was not felt stable enough for this given she was unresponsive and requiring restraints. There was concern that she would not be able to protect her airway and would require intubation. Cardiology was not formally consulted during this admission but she did require IV diuresis. She was never seen by Cardiology after this admission either.   She was most recently admitted to [facility] in 11/2021 after presenting with suicidal ideation and worsening depressive symptoms. [name], she fell out of a chair from the seated position. There was concern that she had some LOC for about 5 seconds and some seizure like activity so she was sent to the ED. Head CT showed no acute findings and was felt to be stable for discharge.  She was recently seen by me on 01/08/2022 at which time she was doing well from a cardiac standpoint. Plan was to wean Clonidine with close follow-up to continue to optimize GDMT. Repeat Echo was also ordered and showed ***.  Patient presents today for follow-up. ***  No chest pain, shortness of breath, orthopnea, PND, lower extremity edema, palpitations, lightheadedness, dizziness, syncope. ***   Non-Ischemic Cardiomyopathy Chronic Combined CHF Initially diagnosed in 04/2020. LHC at that time showed normal coronaries. LVEF as low as 20% in 07/2021. Recent Echo on 01/17/2022 showed ***  - Euvolemic on exam.  - Continue Lasix '80mg'$  daily. - Continue Entresto 24-'26mg'$  twice daily. - Continue Coreg 6.'25mg'$  twice daily.  - Continue Isordil '10mg'$  three times daily. -  Will start Spironolactone 12.5 every other day vs daily. *** - Considered adding Wilder Glade but patient was admitted with septic shock from emphysematous cystitis back in 07/2021 and cannot remember if she has a history of recurrent UTIs before this so will hold  off on this for now. *** - Continue daily weighs and sodium/fluid restrictions.  - Discussed with MD and will order cardiac MRI for further evaluation of cardiomyopathy. *** - ICD ***   Hypertension BP soft but stable. *** - Continue medications for CHF as above.    Hyperlipidemia Lipid panel as high as 217 in 2020. Most recent lipid panel in 07/2021 at time of stroke: Total Cholesterol 211, Triglycerides 118, HDL 46, LDL 141.  - Started on Lipitor '80mg'$  daily at time of stroke. Continue. - Will repeat lipid panel and LFTs. ***   Stroke Patient was admitted in 07/2021 with acute stroke and was found to have bilateral multifocal stroke with infarcts in the left PCA, right cerebellum, bilateral frontal and right thalamus. TTE showed LVEF of at best 20% with LV trabeculation but no thrombus. TEE was initially planned to rule out endocarditis but was not performed due to concern that patient would not be able to protect her airway and would require intubation. Neurology recommended Eliquis at that time given low EF with plans to switch back to antiplatelet therapy once EF >30%. Repeat Echo on 01/17/2022 showed *** - Continue Eliquis '5mg'$  twice daily. - Continue high-intensity statin. - Also may need to consider Event Monitor to rule out atrial fibrillation given patient did not have this done following stroke. Can discuss more at follow-up visit. ***  Past Medical History:  Diagnosis Date   Allergy    Anxiety    Arthritis    Bipolar 1 disorder (HCC)    Chronic abdominal pain    Constipation    Depression    Heart failure (HCC)    Hypertension    IBS (irritable bowel syndrome)    Learning disability    MDD (major depressive disorder)    Metabolic encephalopathy    Neuromuscular disorder (HCC)    Rhabdomyolysis    Sepsis ([place])    Stroke [facility])     Past Surgical History:  Procedure Laterality Date   ABDOMINAL HYSTERECTOMY     GALLBLADDER SURGERY     IR GASTROSTOMY TUBE MOD SED   08/07/2021   KNEE SURGERY     RIGHT/LEFT HEART CATH AND CORONARY ANGIOGRAPHY N/A 05/04/2020   Procedure: RIGHT/LEFT HEART CATH AND CORONARY ANGIOGRAPHY;  Surgeon: Belva Crome, MD;  Location: [facility];  Service: Cardiovascular;  Laterality: N/A;    Current Medications: No outpatient medications have been marked as taking for the 01/22/22 encounter (Appointment) with Darreld Mclean, PA-C.     Allergies:   Ketorolac, Morphine and related, Nsaids, and Trazodone and nefazodone   Social History   Socioeconomic History   Marital status: Legally Separated    Spouse name: Not on file   Number of children: 2   Years of education: Not on file   Highest education level: Not on file  Occupational History   Occupation: DISABLED    Employer: UNEMPLOYED  Tobacco Use   Smoking status: Never   Smokeless tobacco: Never  Vaping Use   Vaping Use: Never used  Substance and Sexual Activity   Alcohol use: No   Drug use: Yes    Types: Other-see comments, Marijuana    Comment: opiates   Sexual activity: Not Currently  Birth control/protection: Surgical  Other Topics Concern   Not on file  Social History Narrative   Not on file   Social Determinants of Health   Financial Resource Strain: Not on file  Food Insecurity: Not on file  Transportation Needs: Not on file  Physical Activity: Not on file  Stress: Not on file  Social Connections: Not on file     Family History: The patient's family history includes Depression in her father; Diabetes in her mother; Heart disease in her father; Stroke in her father; Suicidality in her mother; Transient ischemic attack in her mother.  ROS:   Please see the history of present illness.     EKGs/Labs/Other Studies Reviewed:    The following studies were reviewed today: Right/Left Cardiac Catheterization 05/04/2020: Widely patent coronary arteries. Acute on chronic combined systolic and diastolic heart failure with mean wedge 18 mmHg  and LVEDP 20 mmHg Moderate pulmonary hypertension, mean PAP 35 mmHg; . WHO Group II (Hemodynamic classification: Pulmonary capillary wedge pressure>15; pulmonary vascular resistance > 3 WU)   Recommendations: Guideline directed therapy for systolic heart failure. Consider obstructive sleep apnea as a contributor to pulmonary hypertension given the elevated vascular resistance.   Diagnostic Dominance: Right _______________   Echocardiogram 07/31/2021: Impressions:  1. Left ventricular ejection fraction, by estimation, is at best 20%. The  left ventricle has severely decreased function. The left ventricle  demonstrates global hypokinesis. There is mild left ventricular  hypertrophy. Left ventricular diastolic  parameters are indeterminate. There is LV trabeculation without thrombus.   2. Right ventricular systolic function is moderately reduced. The right  ventricular size is normal. There is severely elevated pulmonary artery  systolic pressure. The estimated right ventricular systolic pressure is  09.3 mmHg.   3. Left atrial size was mildly dilated.   4. The mitral valve is normal in structure. Mild mitral valve  regurgitation. No evidence of mitral stenosis.   5. Tricuspid valve regurgitation is moderate to severe.   6. The aortic valve is tricuspid. There is mild thickening of the aortic  valve. Aortic valve regurgitation is not visualized. Aortic valve  sclerosis is present, with no evidence of aortic valve stenosis.   7. The inferior vena cava is dilated in size with <50% respiratory  variability, suggesting right atrial pressure of 15 mmHg.   Comparison(s): A prior study was performed on 05/02/2020. Further decrease in LV function.  _______________   Carotid Ultrasounds 08/01/2021: Summary:  - Right Carotid: Velocities in the right ICA are consistent with a 1-39%  stenosis.  - Left Carotid: Velocities in the left ICA are consistent with a 1-39%  stenosis.  - Vertebrals:   Bilateral vertebral arteries demonstrate antegrade flow.  - Subclavians: Normal flow hemodynamics were seen in bilateral subclavian arteries. _______________  Echocardiogram 01/17/2022:  ***  EKG:  EKG not ordered today.   Recent Labs: 08/08/2021: Magnesium 2.3 08/11/2021: B Natriuretic Peptide 631.3 12/03/2021: ALT 24; Platelets 293; TSH 2.284 12/09/2021: BUN 9; Creatinine, Ser 0.90; Hemoglobin 13.3; Potassium 3.6; Sodium 143  Recent Lipid Panel    Component Value Date/Time   CHOL 211 (H) 07/31/2021 0344   TRIG 118 07/31/2021 0344   HDL 46 07/31/2021 0344   CHOLHDL 4.6 07/31/2021 0344   VLDL 24 07/31/2021 0344   LDLCALC 141 (H) 07/31/2021 0344    Physical Exam:    Vital Signs: There were no vitals taken for this visit.    Wt Readings from Last 3 Encounters:  01/08/22 186 lb 9.6 oz (84.6  kg)  12/27/21 186 lb 6.4 oz (84.6 kg)  10/08/21 216 lb 0.8 oz (98 kg)     General: 56 y.o. female in no acute distress. HEENT: Normocephalic and atraumatic. Sclera clear. EOMs intact. Neck: Supple. No carotid bruits. No JVD. Heart: *** RRR. Distinct S1 and S2. No murmurs, gallops, or rubs. Radial and distal pedal pulses 2+ and equal bilaterally. Lungs: No increased work of breathing. Clear to ausculation bilaterally. No wheezes, rhonchi, or rales.  Abdomen: Soft, non-distended, and non-tender to palpation. Bowel sounds present in all 4 quadrants.  MSK: Normal strength and tone for age. *** Extremities: No lower extremity edema.    Skin: Warm and dry. Neuro: Alert and oriented x3. No focal deficits. Psych: Normal affect. Responds appropriately.   Assessment:    No diagnosis found.  Plan:     Disposition: Follow up in ***   Medication Adjustments/Labs and Tests Ordered: Current medicines are reviewed at length with the patient today.  Concerns regarding medicines are outlined above.  No orders of the defined types were placed in this encounter.  No orders of the defined types  were placed in this encounter.   There are no Patient Instructions on file for this visit.   Signed, Darreld Mclean, PA-C  01/12/2022 3:03 PM    [facility]

## 2022-01-17 ENCOUNTER — Ambulatory Visit (INDEPENDENT_AMBULATORY_CARE_PROVIDER_SITE_OTHER): Payer: Medicare HMO

## 2022-01-17 DIAGNOSIS — I5042 Chronic combined systolic (congestive) and diastolic (congestive) heart failure: Secondary | ICD-10-CM | POA: Diagnosis not present

## 2022-01-17 DIAGNOSIS — I428 Other cardiomyopathies: Secondary | ICD-10-CM | POA: Diagnosis not present

## 2022-01-17 DIAGNOSIS — Z8673 Personal history of transient ischemic attack (TIA), and cerebral infarction without residual deficits: Secondary | ICD-10-CM

## 2022-01-17 DIAGNOSIS — E785 Hyperlipidemia, unspecified: Secondary | ICD-10-CM

## 2022-01-17 DIAGNOSIS — I10 Essential (primary) hypertension: Secondary | ICD-10-CM

## 2022-01-21 ENCOUNTER — Encounter: Payer: Self-pay | Admitting: Student

## 2022-01-21 DIAGNOSIS — I428 Other cardiomyopathies: Secondary | ICD-10-CM | POA: Insufficient documentation

## 2022-01-22 ENCOUNTER — Ambulatory Visit: Payer: Medicare HMO | Admitting: Student

## 2022-01-22 DIAGNOSIS — I428 Other cardiomyopathies: Secondary | ICD-10-CM

## 2022-01-24 LAB — ECHOCARDIOGRAM COMPLETE
AR max vel: 1.68 cm2
AV Area VTI: 1.77 cm2
AV Area mean vel: 1.77 cm2
AV Mean grad: 2 mmHg
AV Peak grad: 4.4 mmHg
Ao pk vel: 1.05 m/s
Area-P 1/2: 4.57 cm2
Calc EF: 57.4 %
S' Lateral: 2.62 cm
Single Plane A2C EF: 57.9 %
Single Plane A4C EF: 55.8 %

## 2022-02-09 NOTE — Progress Notes (Signed)
Cardiology Office Note:    Date:  02/11/2022   ID:  Dorothy Alvarez, DOB 1966/06/28, MRN [id number]  PCP:  Fleet Contras, MD  Cardiologist:  Thurmon Fair, MD  Electrophysiologist:  None   Referring MD: Fleet Contras, MD   Chief Complaint: follow-up of CHF  History of Present Illness:    Dorothy Alvarez is a 56 y.o. female with a history of normal coronary arteries on cardiac catheterization in 04/2020, non-ischemic cardiomyopathy/ chronic systolic CHF with EF of 20% on most recent Echo in 07/2021, multiple strokes, hypertension, hyperlipidemia, left renal mass noted on CT in 09/2021, possible bipolar disorder, depression/anxiety, and vascular dementia who is a patient of Dr. Royann Shivers and presents today for follow-up of CHF.    Patient was admitted in 04/2020 with new onset CHF after presenting with dyspnea on exertion, orthopnea, and lower extremity edema. Echo showed LVEF of 25-30% with global hypokinesis, mild LVH, grade III diastolic dysfunction as well as mild MR and mildly elevated PASP. She underwent R/LHC which showed widely patent coronary arteries and moderate pulmonary hypertension (WHO Group II). She was diuresed and started on GDMT. Outpatient sleep study was recommended. She was lost to follow-up after this hospitalization and has a 100% no show rate in our office.   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  Brain MRI showed diffusion defect concerning for acute left PCA infarct. Further work-up by Neurology revealed bilateral multifocal stroke with infarcts in the left PCA, right cerebellum, bilateral frontal and right thalamus. Echo showed LVEF of at best 20% with global hypokinesis and mild LVH with LV trabeculation but no thrombus. RV normal in size with moderately reduced RV systolic function and severely elevated PASP of 65.7 mmHg. She required PEG tube due to dysphagia from stroke. She was was  started on Eliquis and a high-intensity statin. Initial plan was for TEE during admission to rule out endocarditis but patient was not felt stable enough for this given she was unresponsive and requiring restraints. There was concern that she would not be able to protect her airway and would require intubation. Cardiology was not formally consulted during this admission but she did require IV diuresis. She was never seen by Cardiology after this admission either.   She was most recently admitted to [facility] in 11/2021 after presenting with suicidal ideation and worsening depressive symptoms. [name], she fell out of a chair from the seated position. There was concern that she had some LOC for about 5 seconds and some seizure like activity so she was sent to the ED. Head CT showed no acute findings and was felt to be stable for discharge.  Patient was last seen by me on 01/08/2022 at which time she was doing well and denied any cardiac symptoms. She was weaned off Clonidine in an effort to further optimize GDMT and Repeat Echo was ordered to reassess LV function. Echo showed LVEF of 50-55% with no regional wall motion abnormalities, severe LVH, and grade 1 diastolic dysfunction as well as normal RV with mildly reduced RV systolic function, severely dilated left atrium, and small pericardial effusion.   Patient presents today for follow-up. Here with daughter.  She is doing very well since last visit.  She denies any cardiac symptoms.  No chest pain, shortness of breath, orthopnea, PND, lower extremity edema, palpitations, lightheadedness, dizziness, syncope.  Her weight is stable and her BP  is well controlled after weaning off of Clonidine.  Past Medical History:  Diagnosis Date   Allergy    Anxiety    Arthritis    Bipolar 1 disorder (HCC)    Chronic abdominal pain    Chronic combined systolic and diastolic CHF (congestive heart failure) (HCC)     Constipation    Depression    Heart failure (HCC)    Hyperlipidemia    Hypertension    IBS (irritable bowel syndrome)    Learning disability    MDD (major depressive disorder)    Metabolic encephalopathy    Neuromuscular disorder (HCC)    Nonischemic cardiomyopathy (HCC)    Rhabdomyolysis    Sepsis (HCC)    Stroke [facility])     Past Surgical History:  Procedure Laterality Date   ABDOMINAL HYSTERECTOMY     GALLBLADDER SURGERY     IR GASTROSTOMY TUBE MOD SED  08/07/2021   KNEE SURGERY     RIGHT/LEFT HEART CATH AND CORONARY ANGIOGRAPHY N/A 05/04/2020   Procedure: RIGHT/LEFT HEART CATH AND CORONARY ANGIOGRAPHY;  Surgeon: Lyn Records, MD;  Location: [facility] INVASIVE CV LAB;  Service: Cardiovascular;  Laterality: N/A;    Current Medications: Current Meds  Medication Sig   apixaban (ELIQUIS) 5 MG TABS tablet Take 1 tablet (5 mg total) by mouth 2 (two) times daily.   atorvastatin (LIPITOR) 80 MG tablet Take 1 tablet (80 mg total) by mouth daily.   carvedilol (COREG) 6.25 MG tablet Take 1 tablet (6.25 mg total) by mouth 2 (two) times daily with a meal.   furosemide (LASIX) 80 MG tablet Take 1 tablet (80 mg total) by mouth daily.   isosorbide dinitrate (ISORDIL) 10 MG tablet Take 1 tablet (10 mg total) by mouth 3 (three) times daily.   sacubitril-valsartan (ENTRESTO) 24-26 MG Take 1 tablet by mouth 2 (two) times daily.   sacubitril-valsartan (ENTRESTO) 24-26 MG Take 1 tablet by mouth 2 (two) times daily.     Allergies:   Ketorolac, Morphine and related, Nsaids, and Trazodone and nefazodone   Social History   Socioeconomic History   Marital status: Legally Separated    Spouse name: Not on file   Number of children: 2   Years of education: Not on file   Highest education level: Not on file  Occupational History   Occupation: DISABLED    Employer: UNEMPLOYED  Tobacco Use   Smoking status: Never   Smokeless tobacco: Never  Vaping Use   Vaping Use: Never used  Substance and Sexual  Activity   Alcohol use: No   Drug use: Yes    Types: Other-see comments, Marijuana    Comment: opiates   Sexual activity: Not Currently    Birth control/protection: Surgical  Other Topics Concern   Not on file  Social History Narrative   Not on file   Social Determinants of Health   Financial Resource Strain: Not on file  Food Insecurity: Not on file  Transportation Needs: Not on file  Physical Activity: Not on file  Stress: Not on file  Social Connections: Not on file     Family History: The patient's family history includes Depression in her father; Diabetes in her mother; Heart disease in her father; Stroke in her father; Suicidality in her mother; Transient ischemic attack in her mother.  ROS:   Please see the history of present illness.     EKGs/Labs/Other Studies Reviewed:    The following studies were reviewed:  Right/Left Cardiac Catheterization 05/04/2020: Widely patent  coronary arteries. Acute on chronic combined systolic and diastolic heart failure with mean wedge 18 mmHg and LVEDP 20 mmHg Moderate pulmonary hypertension, mean PAP 35 mmHg; . WHO Group II (Hemodynamic classification: Pulmonary capillary wedge pressure>15; pulmonary vascular resistance > 3 WU)   Recommendations: Guideline directed therapy for systolic heart failure. Consider obstructive sleep apnea as a contributor to pulmonary hypertension given the elevated vascular resistance.   Diagnostic Dominance: Right  _______________   Echocardiogram 07/31/2021: Impressions:  1. Left ventricular ejection fraction, by estimation, is at best 20%. The  left ventricle has severely decreased function. The left ventricle  demonstrates global hypokinesis. There is mild left ventricular  hypertrophy. Left ventricular diastolic  parameters are indeterminate. There is LV trabeculation without thrombus.   2. Right ventricular systolic function is moderately reduced. The right  ventricular size is normal.  There is severely elevated pulmonary artery  systolic pressure. The estimated right ventricular systolic pressure is  65.7 mmHg.   3. Left atrial size was mildly dilated.   4. The mitral valve is normal in structure. Mild mitral valve  regurgitation. No evidence of mitral stenosis.   5. Tricuspid valve regurgitation is moderate to severe.   6. The aortic valve is tricuspid. There is mild thickening of the aortic  valve. Aortic valve regurgitation is not visualized. Aortic valve  sclerosis is present, with no evidence of aortic valve stenosis.   7. The inferior vena cava is dilated in size with <50% respiratory  variability, suggesting right atrial pressure of 15 mmHg.   Comparison(s): A prior study was performed on 05/02/2020. Further decrease in LV function.  _______________   Carotid Ultrasounds 08/01/2021: Summary:  - Right Carotid: Velocities in the right ICA are consistent with a 1-39%  stenosis.  - Left Carotid: Velocities in the left ICA are consistent with a 1-39%  stenosis.  - Vertebrals:  Bilateral vertebral arteries demonstrate antegrade flow.  - Subclavians: Normal flow hemodynamics were seen in bilateral subclavian arteries.  _______________  Echocardiogram 01/17/2022: Impressions:  1. Left ventricular ejection fraction, by estimation, is 50 to 55%. The  left ventricle has low normal function. The left ventricle has no regional  wall motion abnormalities. There is severe concentric left ventricular  hypertrophy. Left ventricular  diastolic parameters are consistent with Grade I diastolic dysfunction  (impaired relaxation).   2. Right ventricular systolic function is mildly reduced. The right  ventricular size is normal. There is normal pulmonary artery systolic  pressure.   3. Left atrial size was severely dilated.   4. A small pericardial effusion is present. The pericardial effusion is  anterior to the right ventricle.   5. The mitral valve is normal in structure.  Trivial mitral valve  regurgitation. No evidence of mitral stenosis.   6. The aortic valve is tricuspid. Aortic valve regurgitation is not  visualized. No aortic stenosis is present.   7. Aortic dilatation noted. There is borderline dilatation of the  ascending aorta, measuring 36 mm.   8. The inferior vena cava is normal in size with greater than 50%  respiratory variability, suggesting right atrial pressure of 3 mmHg.   Comparison(s): The left ventricular function has improved. EF 20%, mild  LVH, mod-sev TR, RVSP 65.77mmHg.   EKG:  EKG not ordered today.   Recent Labs: 08/08/2021: Magnesium 2.3 08/11/2021: B Natriuretic Peptide 631.3 12/03/2021: ALT 24; Platelets 293; TSH 2.284 12/09/2021: BUN 9; Creatinine, Ser 0.90; Hemoglobin 13.3; Potassium 3.6; Sodium 143  Recent Lipid Panel    Component  Value Date/Time   CHOL 211 (H) 07/31/2021 0344   TRIG 118 07/31/2021 0344   HDL 46 07/31/2021 0344   CHOLHDL 4.6 07/31/2021 0344   VLDL 24 07/31/2021 0344   LDLCALC 141 (H) 07/31/2021 0344    Physical Exam:    Vital Signs: BP 128/76   Pulse 80   Wt 185 lb 3.2 oz (84 kg)   SpO2 96%   BMI 33.87 kg/m     Wt Readings from Last 3 Encounters:  02/11/22 185 lb 3.2 oz (84 kg)  01/08/22 186 lb 9.6 oz (84.6 kg)  12/27/21 186 lb 6.4 oz (84.6 kg)     General: 56 y.o. African-American female in no acute distress. HEENT: Normocephalic and atraumatic. Sclera clear.  Neck: Supple. No JVD. Heart: RRR. Distinct S1 and S2. No murmurs, gallops, or rubs. Radial pulses 2+ and equal bilaterally. Lungs: No increased work of breathing. Clear to ausculation bilaterally. No wheezes, rhonchi, or rales.  Abdomen: Soft, non-distended, and non-tender to palpation.  Extremities: No lower extremity edema.    Skin: Warm and dry. Neuro: No focal deficits. Psych: Normal affect.   Assessment:    1. Nonischemic cardiomyopathy (HCC)   2. Chronic combined systolic and diastolic heart failure (HCC)   3.  Primary hypertension   4. Hyperlipidemia, unspecified hyperlipidemia type   5. History of stroke     Plan:    Non-Ischemic Cardiomyopathy Chronic Combined CHF Initially diagnosed in 04/2020 when found to have LVEF of 25-30%. LHC at that time showed normal coronaries. Last Echo in 07/2021 showed LVEF of at best 20% with global hypokinesis and mild LVH with LV trabeculation but no thrombus. RV normal in size with moderately reduced RV systolic function and severely elevated PASP of 65.7 mmHg. Repeat Echo on 01/17/2022 LVEF of 50-55% with no regional wall motion abnormalities, severe LVH, and grade 1 diastolic dysfunction as well as normal RV with mildly reduced RV systolic function, severely dilated left atrium, normal PASP, and small pericardial effusion.  - Euvolemic on exam.  - Continue Lasix 80mg  daily. - Continue Entresto 24-26mg  twice daily. - Continue Coreg 6.25mg  twice daily.  - Continue Isordil 10mg  three times daily. - Will recheck BMET today and if renal function stable, will increase Entresto to 49-51mg  twice daily and decrease Lasix to 40mg  daily. Will then plan to repeat CMET (along with lipid panel) in 1-2 weeks after increasing Entresto. - Will order cardiac MRI to further evaluation etiology of cardiomyopathy given EF was initially severely reduced and most recent Echo shows severe LVH. Discussed this with Dr. Royann Shivers after visit last month who was in agreement with this.   Hypertension BP well controlled. - Continue medications for CHF as above.   Hyperlipidemia Lipid panel as high as 217 in 2020. Most recent lipid panel in 07/2021 at time of stroke: Total Cholesterol 211, Triglycerides 118, HDL 46, LDL 141.  - Started on Lipitor 80mg  daily at time of stroke. Continue. - Patient is not fasting today. Will repeat lipid panel and CMET in 2 weeks.   Stroke Patient was admitted in 07/2021 with acute stroke and was found to have bilateral multifocal stroke with infarcts in the  left PCA, right cerebellum, bilateral frontal and right thalamus. TTE showed LVEF of at best 20% with LV trabeculation but no thrombus. TEE was initially planned to rule out endocarditis but was not performed due to concern that patient would not be able to protect her airway and would require intubation. Recent repeat TTE  showed normalization of EF.  - Continue high-intensity statin.  - Neurology recommended stopping Eliquis and restarting Aspirin 81mg  daily if  EF >30% on updated Echo. I will order Event Monitor to rule out atrial fibrillation given patient did not have this done following stroke. If this shows no evidence of atrial fibrillation/flutter, will stop Eliquis and restart Aspirin 81mg  daily.  Disposition: Follow up in 6 months.    Medication Adjustments/Labs and Tests Ordered: Current medicines are reviewed at length with the patient today.  Concerns regarding medicines are outlined above.  Orders Placed This Encounter  Procedures   MR CARDIAC MORPHOLOGY W WO CONTRAST   Basic metabolic panel   Comprehensive metabolic panel   Lipid panel   No orders of the defined types were placed in this encounter.   Patient Instructions  Medication Instructions:  No Changes *If you need a refill on your cardiac medications before your next appointment, please call your pharmacy*   Lab Work: BMET today. CMET,Lipid Panel 2 Weeks If you have labs (blood work) drawn today and your tests are completely normal, you will receive your results only by: MyChart Message (if you have MyChart) OR A paper copy in the mail If you have any lab test that is abnormal or we need to change your treatment, we will call you to review the results.   Testing/Procedures: [facility], [address].  Your physician has requested that you have a cardiac MRI. Cardiac MRI uses a computer to create images of your heart as its beating, producing both still and moving pictures of your heart and  major blood vessels. For further information please visit [email or web address]. Please follow the instruction sheet given to you today for more information.   [employer] Cardiac Event Monitor Instructions Your physician has requested you wear your cardiac event monitor for 30 days, (1-30). [employer] may call or text to confirm a shipping address. The monitor will be sent to a land address via UPS. [employer] will not ship a monitor to a PO BOX. It typically takes 3-5 days to receive your monitor after it has been enrolled. [employer] will assist with USPS tracking if your package is delayed. The telephone number for [employer] is [phone]. Once you have received your monitor, please review the enclosed instructions. Instruction tutorials can also be viewed under help and settings on the enclosed cell phone. Your monitor has already been registered assigning a specific monitor serial # to you.  Applying the monitor Remove cell phone from case and turn it on. The cell phone works as IT consultant and needs to be within [employer] of you at all times. The cell phone will need to be charged on a daily basis. We recommend you plug the cell phone into the enclosed charger at your bedside table every night.  Monitor batteries: You will receive two monitor batteries labelled #1 and #2. These are your recorders. Plug battery #2 onto the second connection on the enclosed charger. Keep one battery on the charger at all times. This will keep the monitor battery deactivated. It will also keep it fully charged for when you need to switch your monitor batteries. A small light will be blinking on the battery emblem when it is charging. The light on the battery emblem will remain on when the battery is fully charged.  Open package of a Monitor strip. Insert battery #1 into black hood on strip and gently squeeze monitor battery onto connection as indicated in instruction booklet. Set  aside while preparing  skin.  Choose location for your strip, vertical or horizontal, as indicated in the instruction booklet. Shave to remove all hair from location. There cannot be any lotions, oils, powders, or colognes on skin where monitor is to be applied. Wipe skin clean with enclosed Saline wipe. Dry skin completely.  Peel paper labeled #1 off the back of the Monitor strip exposing the adhesive. Place the monitor on the chest in the vertical or horizontal position shown in the instruction booklet. One arrow on the monitor strip must be pointing upward. Carefully remove paper labeled #2, attaching remainder of strip to your skin. Try not to create any folds or wrinkles in the strip as you apply it.  Firmly press and release the circle in the center of the monitor battery. You will hear a small beep. This is turning the monitor battery on. The heart emblem on the monitor battery will light up every 5 seconds if the monitor battery in turned on and connected to the patient securely. Do not push and hold the circle down as this turns the monitor battery off. The cell phone will locate the monitor battery. A screen will appear on the cell phone checking the connection of your monitor strip. This may read poor connection initially but change to good connection within the next minute. Once your monitor accepts the connection you will hear a series of 3 beeps followed by a climbing crescendo of beeps. A screen will appear on the cell phone showing the two monitor strip placement options. Touch the picture that demonstrates where you applied the monitor strip.  Your monitor strip and battery are waterproof. You are able to shower, bathe, or swim with the monitor on. They just ask you do not submerge deeper than 3 feet underwater. We recommend removing the monitor if you are swimming in a lake, river, or ocean.  Your monitor battery will need to be switched to a fully charged monitor battery approximately once a  week. The cell phone will alert you of an action which needs to be made.  On the cell phone, tap for details to reveal connection status, monitor battery status, and cell phone battery status. The green dots indicates your monitor is in good status. A red dot indicates there is something that needs your attention.  To record a symptom, click the circle on the monitor battery. In 30-60 seconds a list of symptoms will appear on the cell phone. Select your symptom and tap save. Your monitor will record a sustained or significant arrhythmi a regardless of you clicking the button. Some patients do not feel the heart rhythm irregularities. [employer] will notify us of any serious or critical events.  Refer to instruction booklet for instructions on switching batteries, changing strips, the Do not disturb or Pause features, or any additional questions.  Call [employer] at [phone], to confirm your monitor is transmitting and record your baseline. They will answer any questions you may have regarding the monitor instructions at that time.  Returning the monitor to [employer] Place all equipment back into blue box. Peel off strip of paper to expose adhesive and close box securely. There is a prepaid UPS shipping label on this box. Drop in a UPS drop box, or at a UPS facility like [employer]. You may also contact [employer] to arrange UPS to pick up monitor package at your home.    Follow-Up: At [facility], you and your health needs are our priority.  As part of  our continuing mission to provide you with exceptional heart care, we have created designated Provider Care Teams.  These Care Teams include your primary Cardiologist (physician) and Advanced Practice Providers (APPs -  Physician Assistants and Nurse Practitioners) who all work together to provide you with the care you need, when you need it.  We recommend signing up for the patient portal called "MyChart".  Sign up information is  provided on this After Visit Summary.  MyChart is used to connect with patients for Virtual Visits (Telemedicine).  Patients are able to view lab/test results, encounter notes, upcoming appointments, etc.  Non-urgent messages can be sent to your provider as well.   To learn more about what you can do with MyChart, go to [email or web address].    Your next appointment:   6 month(s)  The format for your next appointment:   In Person  Provider:   Marjie Skiff, PA-C  or, Thurmon Fair, MD  .      Important Information About Sugar         Signed, Corrin Parker, PA-C  02/11/2022 2:59 PM    [facility]

## 2022-02-11 ENCOUNTER — Encounter: Payer: Self-pay | Admitting: Student

## 2022-02-11 ENCOUNTER — Other Ambulatory Visit: Payer: Self-pay | Admitting: Student

## 2022-02-11 ENCOUNTER — Ambulatory Visit (INDEPENDENT_AMBULATORY_CARE_PROVIDER_SITE_OTHER): Payer: Medicare HMO | Admitting: Student

## 2022-02-11 VITALS — BP 128/76 | HR 80 | Wt 185.2 lb

## 2022-02-11 DIAGNOSIS — I639 Cerebral infarction, unspecified: Secondary | ICD-10-CM

## 2022-02-11 DIAGNOSIS — I428 Other cardiomyopathies: Secondary | ICD-10-CM

## 2022-02-11 DIAGNOSIS — I4891 Unspecified atrial fibrillation: Secondary | ICD-10-CM

## 2022-02-11 DIAGNOSIS — E785 Hyperlipidemia, unspecified: Secondary | ICD-10-CM

## 2022-02-11 DIAGNOSIS — I10 Essential (primary) hypertension: Secondary | ICD-10-CM

## 2022-02-11 DIAGNOSIS — Z8673 Personal history of transient ischemic attack (TIA), and cerebral infarction without residual deficits: Secondary | ICD-10-CM

## 2022-02-11 DIAGNOSIS — I5042 Chronic combined systolic (congestive) and diastolic (congestive) heart failure: Secondary | ICD-10-CM

## 2022-02-12 ENCOUNTER — Other Ambulatory Visit: Payer: Self-pay

## 2022-02-12 LAB — BASIC METABOLIC PANEL
BUN/Creatinine Ratio: 9 (ref 9–23)
BUN: 9 mg/dL (ref 6–24)
CO2: 28 mmol/L (ref 20–29)
Calcium: 9.5 mg/dL (ref 8.7–10.2)
Chloride: 103 mmol/L (ref 96–106)
Creatinine, Ser: 0.97 mg/dL (ref 0.57–1.00)
Glucose: 64 mg/dL — ABNORMAL LOW (ref 70–99)
Potassium: 4.7 mmol/L (ref 3.5–5.2)
Sodium: 143 mmol/L (ref 134–144)
eGFR: 68 mL/min/{1.73_m2} (ref >59)

## 2022-02-12 MED ORDER — FUROSEMIDE 40 MG PO TABS: 40.0000 mg | ORAL_TABLET | Freq: Every day | ORAL | 3 refills | Status: DC

## 2022-02-12 MED ORDER — ENTRESTO 49-51 MG PO TABS
1.0000 | ORAL_TABLET | Freq: Two times a day (BID) | ORAL | 3 refills | Status: DC
Start: 2022-02-12 — End: 2022-06-12

## 2022-02-15 ENCOUNTER — Emergency Department (HOSPITAL_COMMUNITY): Payer: Medicare HMO

## 2022-02-15 ENCOUNTER — Other Ambulatory Visit: Payer: Self-pay

## 2022-02-15 ENCOUNTER — Encounter (HOSPITAL_COMMUNITY): Payer: Self-pay

## 2022-02-15 ENCOUNTER — Emergency Department (HOSPITAL_COMMUNITY)
Admission: EM | Admit: 2022-02-15 | Discharge: 2022-02-15 | Disposition: A | Discharge status: Home / Self Care | Payer: Medicare HMO | Attending: Emergency Medicine | Admitting: Emergency Medicine

## 2022-02-15 DIAGNOSIS — I10 Essential (primary) hypertension: Secondary | ICD-10-CM | POA: Diagnosis not present

## 2022-02-15 DIAGNOSIS — R079 Chest pain, unspecified: Secondary | ICD-10-CM

## 2022-02-15 DIAGNOSIS — Z79899 Other long term (current) drug therapy: Secondary | ICD-10-CM | POA: Diagnosis not present

## 2022-02-15 DIAGNOSIS — I428 Other cardiomyopathies: Secondary | ICD-10-CM

## 2022-02-15 DIAGNOSIS — Z7901 Long term (current) use of anticoagulants: Secondary | ICD-10-CM | POA: Insufficient documentation

## 2022-02-15 DIAGNOSIS — I429 Cardiomyopathy, unspecified: Secondary | ICD-10-CM | POA: Insufficient documentation

## 2022-02-15 DIAGNOSIS — R739 Hyperglycemia, unspecified: Secondary | ICD-10-CM | POA: Insufficient documentation

## 2022-02-15 LAB — TROPONIN I (HIGH SENSITIVITY)
Troponin I (High Sensitivity): 10 ng/L (ref <18)
Troponin I (High Sensitivity): 8 ng/L (ref <18)

## 2022-02-15 LAB — BASIC METABOLIC PANEL
Anion gap: 11 (ref 5–15)
BUN: 7 mg/dL (ref 6–20)
CO2: 27 mmol/L (ref 22–32)
Calcium: 9.4 mg/dL (ref 8.9–10.3)
Chloride: 105 mmol/L (ref 98–111)
Creatinine, Ser: 1.02 mg/dL — ABNORMAL HIGH (ref 0.44–1.00)
GFR, Estimated: >60 mL/min (ref >60)
Glucose, Bld: 114 mg/dL — ABNORMAL HIGH (ref 70–99)
Potassium: 4.2 mmol/L (ref 3.5–5.1)
Sodium: 143 mmol/L (ref 135–145)

## 2022-02-15 LAB — CBC
HCT: 44.7 % (ref 36.0–46.0)
Hemoglobin: 15 g/dL (ref 12.0–15.0)
MCH: 32.2 pg (ref 26.0–34.0)
MCHC: 33.6 g/dL (ref 30.0–36.0)
MCV: 95.9 fL (ref 80.0–100.0)
Platelets: 303 10*3/uL (ref 150–400)
RBC: 4.66 MIL/uL (ref 3.87–5.11)
RDW: 12.6 % (ref 11.5–15.5)
WBC: 5.8 10*3/uL (ref 4.0–10.5)
nRBC: 0 % (ref 0.0–0.2)

## 2022-02-15 LAB — I-STAT BETA HCG BLOOD, ED (MC, WL, AP ONLY): I-stat hCG, quantitative: <5 m[IU]/mL (ref <5)

## 2022-02-15 LAB — MAGNESIUM: Magnesium: 1.9 mg/dL (ref 1.7–2.4)

## 2022-02-15 NOTE — Discharge Instructions (Addendum)
As we discussed, I do not see any abnormalities on your work-up of your heart and lungs today.  Your chest pain has resolved at the time of our reevaluation.  My suspicion is that you are having a throbbing chest sensation related to the additional heartbeats that we discussed called PVCs.  At this time we do not need to make any adjustments to her medication, however I would begin wearing the cardiac event monitor that you are provided and follow-up with your cardiologist at your earliest convenience for a follow-up.  Please return to the emergency department as needed for further evaluation if symptoms change or worsen.

## 2022-02-15 NOTE — ED Triage Notes (Signed)
Patient arrived by [facility] with complaint of chest pain since awakening this am. Non-radiating and no associated symptoms. NAD

## 2022-02-15 NOTE — ED Provider Notes (Signed)
[facility] EMERGENCY DEPARTMENT Provider Note   CSN: [id number] Arrival date & time: 02/15/22  1117     History  Chief Complaint  Patient presents with   Chest Pain    Dorothy Alvarez is a 56 y.o. female with an unfortunate recent past medical history of the last 3 years of nonischemic cardiomyopathy, multiple strokes, sepsis, also has history of hypertension, depression, anxiety, bipolar who presents with complaint of left-sided chest pain that started on awakening this morning.  Patient describes the chest pain as throbbing in nature.  She reports that she has had some occasional tingling of her left hand.  She reports the pain is not very severe.  She denies shortness of breath.  Initial presentation, before her husband called 911 patient did report that she felt lightheaded.  She just recently saw her cardiologist on 627, and they had discussed discontinuing her Eliquis and starting her on aspirin after she would wear a heart monitor for 30 days.  Patient has not begun wearing the event monitor yet, still taking Eliquis, denies missing any doses.  They discussed decreasing her Lasix down to 40 mg daily, patient and husband are unsure about this change but it does seem to be documented in her recent visit.  She additionally takes carvedilol, Lipitor, isosorbide bide dinitrate, as well as Entresto.   Chest Pain      Home Medications Prior to Admission medications   Medication Sig Start Date End Date Taking? Authorizing Provider  apixaban (ELIQUIS) 5 MG TABS tablet Take 1 tablet (5 mg total) by mouth 2 (two) times daily. 12/09/21   Massengill, Ovid Curd, MD  atorvastatin (LIPITOR) 80 MG tablet Take 1 tablet (80 mg total) by mouth daily. 12/10/21   Massengill, Ovid Curd, MD  carvedilol (COREG) 6.25 MG tablet Take 1 tablet (6.25 mg total) by mouth 2 (two) times daily with a meal. 12/09/21   Massengill, Ovid Curd, MD  furosemide (LASIX) 40 MG tablet Take 1 tablet (40 mg total) by mouth  daily for 120 doses. 02/12/22 06/12/22  Sande Rives E, PA-C  isosorbide dinitrate (ISORDIL) 10 MG tablet Take 1 tablet (10 mg total) by mouth 3 (three) times daily. 12/09/21   Massengill, Ovid Curd, MD  risperiDONE (RISPERDAL M-TABS) 0.5 MG disintegrating tablet Take 1 tablet (0.5 mg total) by mouth at bedtime. 12/09/21 01/08/22  Massengill, Ovid Curd, MD  sacubitril-valsartan (ENTRESTO) 49-51 MG Take 1 tablet by mouth 2 (two) times daily. 02/12/22   Sande Rives E, PA-C  sertraline (ZOLOFT) 50 MG tablet Take 1 tablet (50 mg total) by mouth daily. 12/10/21 01/09/22  Massengill, Ovid Curd, MD      Allergies    Ketorolac, Morphine and related, Nsaids, and Trazodone and nefazodone    Review of Systems   Review of Systems  Cardiovascular:  Positive for chest pain.  Neurological:  Positive for light-headedness.  All other systems reviewed and are negative.   Physical Exam Updated Vital Signs BP (!) 118/100   Pulse (!) 57   Temp 98.1 F (36.7 C) (Oral)   Resp 18   SpO2 100%  Physical Exam Vitals and nursing note reviewed.  Constitutional:      General: She is not in acute distress.    Appearance: Normal appearance.  HENT:     Head: Normocephalic and atraumatic.  Eyes:     General:        Right eye: No discharge.        Left eye: No discharge.  Cardiovascular:  Rate and Rhythm: Normal rate and regular rhythm.     Heart sounds: No murmur heard.    No friction rub. No gallop.     Comments: No TTP chest wall Pulmonary:     Effort: Pulmonary effort is normal.     Breath sounds: Normal breath sounds.  Abdominal:     General: Bowel sounds are normal.     Palpations: Abdomen is soft.  Musculoskeletal:     Comments: No significant lower extremity edema bilaterally  Skin:    General: Skin is warm and dry.     Capillary Refill: Capillary refill takes less than 2 seconds.  Neurological:     Mental Status: She is alert and oriented to person, place, and time.  Psychiatric:        Mood  and Affect: Mood normal.        Behavior: Behavior normal.     ED Results / Procedures / Treatments   Labs (all labs ordered are listed, but only abnormal results are displayed) Labs Reviewed  BASIC METABOLIC PANEL - Abnormal; Notable for the following components:      Result Value   Glucose, Bld 114 (*)    Creatinine, Ser 1.02 (*)    All other components within normal limits  CBC  MAGNESIUM  I-STAT BETA HCG BLOOD, ED ([facility], [facility], [facility] ONLY)  TROPONIN I (HIGH SENSITIVITY)  TROPONIN I (HIGH SENSITIVITY)    EKG EKG Interpretation  Date/Time:  Saturday February 15 2022 11:34:40 EDT Ventricular Rate:  80 PR Interval:  140 QRS Duration: 80 QT Interval:  410 QTC Calculation: 472 R Axis:   -39 Text Interpretation: Sinus rhythm with frequent Premature ventricular complexes in a pattern of bigeminy Possible Left atrial enlargement Left axis deviation Nonspecific ST and T wave abnormality nonspecific ST/T changes similar to April 2023, PVCs new Confirmed by Sherwood Gambler [id number]) on 02/15/2022 1:58:49 PM  Radiology DG Chest 2 View  Result Date: 02/15/2022 CLINICAL DATA:  Chest pain all over beginning this morning. EXAM: CHEST - 2 VIEW COMPARISON:  08/11/2021 and older studies. FINDINGS: Cardiac silhouette is normal in size and configuration. Normal mediastinal and hilar contours. Lungs are clear.  No pleural effusion or pneumothorax. Skeletal structures are intact. IMPRESSION: No active cardiopulmonary disease. Electronically Signed   By: Lajean Manes M.D.   On: 02/15/2022 11:52    Procedures Procedures    Medications Ordered in ED Medications - No data to display  ED Course/ Medical Decision Making/ A&P                           Medical Decision Making Amount and/or Complexity of Data Reviewed Labs: ordered. Radiology: ordered.   This patient is a 56 y.o. female who presents to the ED for concern of chest pain which feels like throbbing, this involves an extensive number of  treatment options, and is a complaint that carries with it a high risk of complications and morbidity. The emergent differential diagnosis prior to evaluation includes, but is not limited to,  ACS, AAS, PE, Mallory-Weiss, Boerhaave's, Pneumonia, acute bronchitis, asthma or COPD exacerbation, anxiety, MSK pain or traumatic injury to the chest, acid reflux versus other, additionally in this patient with new PVCs on EKG considered that she is feeling new  PVC beats as a throbbing chest sensation..   This is not an exhaustive differential.   Past Medical History / Co-morbidities / Social History: nonischemic cardiomyopathy, multiple strokes, sepsis, also has  history of hypertension, depression, anxiety, bipolar  Additional history: Chart reviewed. Pertinent results include: Extensively reviewed previous echocardiograms, recent outpatient cardiology visit from 3 days ago, lab work, imaging from recent previous emergency department visits  Physical Exam: Physical exam performed. The pertinent findings include: Patient with noted lack of confusion and requiring repeat explanations, she has a history of intellectual disability, literacy, as well as some neurologic deficits from previous strokes.  Her husband provided much of the history.  No noted facial droop or new unilateral weakness.  Normal heart and lung sounds, no signs of fluid overload with no evidence of peripheral edema, abdominal distention  Lab Tests: I ordered, and personally interpreted labs.  The pertinent results include: BMP unremarkable, very mild hyperglycemia, glucose 114, patient nonfasting.  Negative troponin x2.  Magnesium normal.  CBC unremarkable.   Imaging Studies: I ordered imaging studies including film chest x-ray. I independently visualized and interpreted imaging which showed no acute intrathoracic abnormality, notably no evidence of pulmonary edema, signs of heart failure exacerbation, or pneumonia. I agree with the  radiologist interpretation.   Cardiac Monitoring:  The patient was maintained on a cardiac monitor.  My attending physician Dr. Regenia Skeeter viewed and interpreted the cardiac monitored which showed an underlying rhythm of: Patient with some ST segment changes that are fairly similar to previous EKGs, however she has new pattern of PVCs in bigeminy, underlying rhythm sinus. I agree with this interpretation.  Disposition: After consideration of the diagnostic results and the patients response to treatment, I feel that especially as after considerable amount of time on the monitor patient shows no signs of ongoing PVCs and bigeminy, and with her description of chest pain which was throbbing in nature I am suspicious that she may have been feeling the PVCs, and associated this feeling as chest pain.  She is no longer having any chest pain, is in no acute distress.   emergency department workup does not suggest an emergent condition requiring admission or immediate intervention beyond what has been performed at this time. The plan is: Patient to begin wearing her event monitor and follow-up with her cardiologist.. The patient is safe for discharge and has been instructed to return immediately for worsening symptoms, change in symptoms or any other concerns.  I discussed this case with my attending physician Dr. Regenia Skeeter who cosigned this note including patient's presenting symptoms, physical exam, and planned diagnostics and interventions. Attending physician stated agreement with plan or made changes to plan which were implemented.    Final Clinical Impression(s) / ED Diagnoses Final diagnoses:  Chest pain, unspecified type  Nonischemic cardiomyopathy ([place])    Rx / DC Orders ED Discharge Orders          Ordered    Ambulatory referral to Cardiology       Comments: If you have not heard from the Cardiology office within the next 72 hours please call [phone].   02/15/22 1508               Kier Smead, Joesph Fillers, PA-C 02/15/22 1513    Sherwood Gambler, MD 02/15/22 2012

## 2022-02-16 ENCOUNTER — Other Ambulatory Visit: Payer: Self-pay | Admitting: Student

## 2022-02-17 ENCOUNTER — Telehealth: Payer: Self-pay | Admitting: *Deleted

## 2022-02-17 NOTE — Telephone Encounter (Signed)
This is Dr. Croitoru's pt 

## 2022-02-17 NOTE — Telephone Encounter (Signed)
Left a message for the patient to call back.   Croitoru, Dani Gobble, MD  Ricci Barker, RN; Sande Rives E, PA-C Please increase carvedilol to 12.5 mg twice daily and bring in for an earlier appt (within a month or so)

## 2022-02-21 MED ORDER — CARVEDILOL 12.5 MG PO TABS: 12.5000 mg | ORAL_TABLET | Freq: Two times a day (BID) | ORAL | 3 refills | Status: DC

## 2022-02-21 NOTE — Telephone Encounter (Signed)
Patient made aware. She will call back to schedule an appointment when she talks to her daughter.

## 2022-02-28 ENCOUNTER — Ambulatory Visit: Payer: Medicaid Other | Admitting: Cardiovascular Disease

## 2022-03-02 NOTE — Progress Notes (Unsigned)
Cardiology Clinic Note   [name] Date of Encounter: 03/03/2022  Primary Care Provider:  Nolene Ebbs, MD Primary Cardiologist:  Sanda Klein, MD  Patient Profile    Dorothy Alvarez 56 year old female presents the clinic today for follow-up evaluation of her chest discomfort.  Past Medical History    Past Medical History:  Diagnosis Date   Allergy    Anxiety    Arthritis    Bipolar 1 disorder (HCC)    Chronic abdominal pain    Chronic combined systolic and diastolic CHF (congestive heart failure) (HCC)    Constipation    Depression    Heart failure (HCC)    Hyperlipidemia    Hypertension    IBS (irritable bowel syndrome)    Learning disability    MDD (major depressive disorder)    Metabolic encephalopathy    Neuromuscular disorder (HCC)    Nonischemic cardiomyopathy (HCC)    Rhabdomyolysis    Sepsis ([place])    Stroke [facility])    Past Surgical History:  Procedure Laterality Date   ABDOMINAL HYSTERECTOMY     GALLBLADDER SURGERY     IR GASTROSTOMY TUBE MOD SED  08/07/2021   KNEE SURGERY     RIGHT/LEFT HEART CATH AND CORONARY ANGIOGRAPHY N/A 05/04/2020   Procedure: RIGHT/LEFT HEART CATH AND CORONARY ANGIOGRAPHY;  Surgeon: Belva Crome, MD;  Location: [facility];  Service: Cardiovascular;  Laterality: N/A;    Allergies  Allergies  Allergen Reactions   Ketorolac Other (See Comments)    Anxiety and confusion   Morphine And Related Other (See Comments)    Mental status changes (confusion and anxiety)   Nsaids Nausea Only   Trazodone And Nefazodone Other (See Comments)    Restlessness and opposite/paradoxical reaction/effect    History of Present Illness    Dorothy Alvarez has a PMH of normal coronary anatomy via cardiac catheterization 9/21, nonischemic cardiomyopathy, chronic systolic CHF with an EF of 20% on most recent echo 12/22, multiple CVA, hyperlipidemia, HTN, left renal mass noted on CT 2/23, possible bipolar disorder, depression,  anxiety, and vascular dementia.  She was admitted with CHF symptoms on 9/21.  She complained of dyspnea on exertion, orthopnea, lower extremity swelling, echocardiogram showed LVEF of 25-30% with global hypokinesis, mild LVH, grade 3 DD, and mild MR with mildly elevated PASP.  She underwent right and left heart cath which showed widely patent arteries and moderately elevated pulmonary hypertension numbers.  She was diuresed and started on GDMT.  Outpatient study was recommended.  She was lost to follow-up after hospitalization and 100% no-show rate to the cardiology office.  She was seen by Sande Rives, PA-C on 01/08/2022.  During that time she was doing well from a cardiac standpoint.  She has been weaned off clonidine in an effort to optimize GDMT.  Her repeat echocardiogram showed an LVEF of 50-55%, severe LVH, G1 DD, and normal RV with mildly reduced RV systolic function and severely dilated left atrium with small pericardial effusion.  She was seen in follow-up by Sande Rives, PA-C on 02/11/2022.  She presented for follow-up with her daughter.  She was doing well.  She denied cardiac symptoms.  She denied chest pain shortness of breath, PND, orthopnea, lower extremity swelling, palpitations, lightheadedness, presyncope and syncope.  Her weight was stable.  Her blood pressure was well controlled after weaning off clonidine.  She presented to the emergency department on 02/15/2022 with complaints of atypical chest pain.  She described her pain  as sharp in nature.  Her EKG showed sinus rhythm with frequent PAC complexes in a bigeminy pattern with possible left atrial enlargement 80 bpm.  She also reported occasional tingling in her left hand.  During her initial presentation before her husband called 911 she did report that she felt lightheaded.  Her Eliquis was discussed.  She had not yet started to wear her cardiac event monitor and was continued on Eliquis.  She had not missed any doses.  She  presents to the clinic today for follow-up evaluation with her daughter states she has noticed some increased urination since decreasing furosemide and increasing Entresto.  She brings her cardiac event monitor with her today.  Cardiac event monitor use.  Her daughter reports that she has lost around 10 pounds since she was last seen.  She reports that her appetite has been decreased.  We will have her come back tomorrow for fasting lipids and CMP.  I will continue her current medication regimen.  We will await results of cardiac event monitor.  She reports no further episodes of chest discomfort.  We reviewed PACs and her recent ED visit.  Today she denies chest pain, shortness of breath, lower extremity edema, fatigue, palpitations, melena, hematuria, hemoptysis, diaphoresis, weakness, presyncope, syncope, orthopnea, and PND.   Home Medications    Prior to Admission medications   Medication Sig Start Date End Date Taking? Authorizing Provider  apixaban (ELIQUIS) 5 MG TABS tablet Take 1 tablet (5 mg total) by mouth 2 (two) times daily. 12/09/21   Massengill, Ovid Curd, MD  atorvastatin (LIPITOR) 80 MG tablet Take 1 tablet (80 mg total) by mouth daily. 12/10/21   Massengill, Ovid Curd, MD  carvedilol (COREG) 12.5 MG tablet Take 1 tablet (12.5 mg total) by mouth 2 (two) times daily with a meal. 02/21/22   Croitoru, Mihai, MD  furosemide (LASIX) 40 MG tablet Take 1 tablet (40 mg total) by mouth daily for 120 doses. 02/12/22 06/12/22  Sande Rives E, PA-C  isosorbide dinitrate (ISORDIL) 10 MG tablet Take 1 tablet (10 mg total) by mouth 3 (three) times daily. 12/09/21   Massengill, Ovid Curd, MD  risperiDONE (RISPERDAL M-TABS) 0.5 MG disintegrating tablet Take 1 tablet (0.5 mg total) by mouth at bedtime. 12/09/21 01/08/22  Massengill, Ovid Curd, MD  sacubitril-valsartan (ENTRESTO) 49-51 MG Take 1 tablet by mouth 2 (two) times daily. 02/12/22   Sande Rives E, PA-C  sertraline (ZOLOFT) 50 MG tablet Take 1 tablet (50  mg total) by mouth daily. 12/10/21 01/09/22  Janine Limbo, MD    Family History    Family History  Problem Relation Age of Onset   Diabetes Mother    Suicidality Mother    Transient ischemic attack Mother    Heart disease Father    Depression Father    Stroke Father    She indicated that her mother is alive. She indicated that her father is deceased.  Social History    Social History   Socioeconomic History   Marital status: Legally Separated    Spouse name: Not on file   Number of children: 2   Years of education: Not on file   Highest education level: Not on file  Occupational History   Occupation: DISABLED    Employer: UNEMPLOYED  Tobacco Use   Smoking status: Never   Smokeless tobacco: Never  Vaping Use   Vaping Use: Never used  Substance and Sexual Activity   Alcohol use: No   Drug use: Yes    Types: Other-see comments,  Marijuana    Comment: opiates   Sexual activity: Not Currently    Birth control/protection: Surgical  Other Topics Concern   Not on file  Social History Narrative   Not on file   Social Determinants of Health   Financial Resource Strain: Not on file  Food Insecurity: Not on file  Transportation Needs: Not on file  Physical Activity: Not on file  Stress: Not on file  Social Connections: Not on file  Intimate Partner Violence: Not on file     Review of Systems    General:  No chills, fever, night sweats or weight changes.  Cardiovascular:  No chest pain, dyspnea on exertion, edema, orthopnea, palpitations, paroxysmal nocturnal dyspnea. Dermatological: No rash, lesions/masses Respiratory: No cough, dyspnea Urologic: No hematuria, dysuria Abdominal:   No nausea, vomiting, diarrhea, bright red blood per rectum, melena, or hematemesis Neurologic:  No visual changes, wkns, changes in mental status. All other systems reviewed and are otherwise negative except as noted above.  Physical Exam    VS:  BP 119/78   Pulse 74   Ht '5\' 4"'$   (1.626 m)   Wt 177 lb 6.4 oz (80.5 kg)   SpO2 98%   BMI 30.45 kg/m  , BMI Body mass index is 30.45 kg/m. GEN: Well nourished, well developed, in no acute distress. HEENT: normal. Neck: Supple, no JVD, carotid bruits, or masses. Cardiac: RRR, no murmurs, rubs, or gallops. No clubbing, cyanosis, edema.  Radials/DP/PT 2+ and equal bilaterally.  Respiratory:  Respirations regular and unlabored, clear to auscultation bilaterally. GI: Soft, nontender, nondistended, BS + x 4. MS: no deformity or atrophy. Skin: warm and dry, no rash. Neuro:  Strength and sensation are intact. Psych: Normal affect.  Accessory Clinical Findings    Recent Labs: 08/11/2021: B Natriuretic Peptide 631.3 12/03/2021: ALT 24; TSH 2.284 02/15/2022: BUN 7; Creatinine, Ser 1.02; Hemoglobin 15.0; Magnesium 1.9; Platelets 303; Potassium 4.2; Sodium 143   Recent Lipid Panel    Component Value Date/Time   CHOL 211 (H) 07/31/2021 0344   TRIG 118 07/31/2021 0344   HDL 46 07/31/2021 0344   CHOLHDL 4.6 07/31/2021 0344   VLDL 24 07/31/2021 0344   LDLCALC 141 (H) 07/31/2021 0344    ECG personally reviewed by me today-none today.  Right/Left Cardiac Catheterization 05/04/2020: Widely patent coronary arteries. Acute on chronic combined systolic and diastolic heart failure with mean wedge 18 mmHg and LVEDP 20 mmHg Moderate pulmonary hypertension, mean PAP 35 mmHg; . WHO Group II (Hemodynamic classification: Pulmonary capillary wedge pressure>15; pulmonary vascular resistance > 3 WU)   Recommendations: Guideline directed therapy for systolic heart failure. Consider obstructive sleep apnea as a contributor to pulmonary hypertension given the elevated vascular resistance.   Diagnostic Dominance: Right  _______________   Echocardiogram 07/31/2021: Impressions:  1. Left ventricular ejection fraction, by estimation, is at best 20%. The  left ventricle has severely decreased function. The left ventricle  demonstrates  global hypokinesis. There is mild left ventricular  hypertrophy. Left ventricular diastolic  parameters are indeterminate. There is LV trabeculation without thrombus.   2. Right ventricular systolic function is moderately reduced. The right  ventricular size is normal. There is severely elevated pulmonary artery  systolic pressure. The estimated right ventricular systolic pressure is  95.0 mmHg.   3. Left atrial size was mildly dilated.   4. The mitral valve is normal in structure. Mild mitral valve  regurgitation. No evidence of mitral stenosis.   5. Tricuspid valve regurgitation is moderate to severe.  6. The aortic valve is tricuspid. There is mild thickening of the aortic  valve. Aortic valve regurgitation is not visualized. Aortic valve  sclerosis is present, with no evidence of aortic valve stenosis.   7. The inferior vena cava is dilated in size with <50% respiratory  variability, suggesting right atrial pressure of 15 mmHg.   Comparison(s): A prior study was performed on 05/02/2020. Further decrease in LV function.   Assessment & Plan   1.  Chest discomfort-presented to the emergency department on 02/15/2022 with throbbing type chest discomfort.  She also described lightheadedness.  Denies syncope orthopnea and PND.  Reports compliance with apixaban and denies bleeding issues.  Lab work 02/15/2022 showed normal hemoglobin and hematocrit.  Creatinine slightly elevated at 1.02.  EKG at that time showed frequent PVCs and normal sinus rhythm.  Atypical in nature. No plans for further ischemic evaluation Await result of 14 day cardiac event monitor  Nonischemic cardiomyopathy-NYHA class I-2.  Has lost around 10 pounds since she was last seen in cardiology office.  Sedentary. No increased DOE. Continue furosemide, Entresto, carvedilol, Isodril Heart healthy low-sodium diet-May increase caloric intake Increase physical activity as tolerated Repeat CMP.  Chronic combined systolic and  diastolic CHF-euvolemic today.  No increased DOE or activity intolerance. Continue furosemide, Entresto, carvedilol, Isodril Heart healthy low-sodium diet Increase physical activity as tolerated  Essential hypertension-BP today 119/78.   Continue carvedilol, Entresto, Isodril Heart healthy low-sodium diet Increase physical activity as tolerated   Hyperlipidemia-LDL 141 on 12/22 Continue atorvastatin Heart healthy low-sodium diet-salty 6 given Increase physical activity as tolerated Repeat fasting lipids  History of CVA-memory deficits.  Admitted 12/22 with acute CVA and was found to have bilateral multifocal CVA with infarcts in left PCA, right cerebellum, bilateral frontal and right thalamus.  TEE showed LVEF of 20% with LV trabeculation and no thrombus.  Repeat echocardiogram showed normalization of her ejection fraction. Continue atorvastatin Follows with PCP, neurology We will await results of cardiac event monitor Continue apixaban with plan to restart aspirin and discontinue apixaban if no signs of A-fib/flutter.  Disposition: Follow-up in 2 months with Dr. Sallyanne Kuster or Sande Rives, PA-C.Marland Kitchen  Jossie Ng. Lameisha Schuenemann NP-C     03/03/2022, 2:31 PM [place] [address] Office [phone] Fax [phone]  Notice: This dictation was prepared with Dragon dictation along with smaller phrase technology. Any transcriptional errors that result from this process are unintentional and may not be corrected upon review.  I spent 14 minutes examining this patient, reviewing medications, and using patient centered shared decision making involving her cardiac care.  Prior to her visit I spent greater than 20 minutes reviewing her past medical history,  medications, and prior cardiac tests.

## 2022-03-03 ENCOUNTER — Encounter: Payer: Self-pay | Admitting: General Practice

## 2022-03-03 ENCOUNTER — Ambulatory Visit (INDEPENDENT_AMBULATORY_CARE_PROVIDER_SITE_OTHER): Payer: Medicare HMO | Admitting: General Practice

## 2022-03-03 VITALS — BP 119/78 | HR 74 | Ht 64.0 in | Wt 177.4 lb

## 2022-03-03 DIAGNOSIS — I10 Essential (primary) hypertension: Secondary | ICD-10-CM | POA: Diagnosis not present

## 2022-03-03 DIAGNOSIS — I428 Other cardiomyopathies: Secondary | ICD-10-CM

## 2022-03-03 DIAGNOSIS — R0789 Other chest pain: Secondary | ICD-10-CM

## 2022-03-03 DIAGNOSIS — E785 Hyperlipidemia, unspecified: Secondary | ICD-10-CM

## 2022-03-03 DIAGNOSIS — Z8673 Personal history of transient ischemic attack (TIA), and cerebral infarction without residual deficits: Secondary | ICD-10-CM

## 2022-03-03 NOTE — Patient Instructions (Signed)
Medication Instructions:  The current medical regimen is effective;  continue present plan and medications as directed. Please refer to the Current Medication list given to you today.   *If you need a refill on your cardiac medications before your next appointment, please call your pharmacy*  Lab Work:     [place]      If you have labs (blood work) drawn today and your tests are completely normal, you will receive your results only by: [place] (if you have MyChart) OR  A paper copy in the mail If you have any lab test that is abnormal or we need to change your treatment, we will call you to review the results.  Special Instructions INCREASE OVERALL CALORIC INTAKE  Healthy Fat Foods  [place]. Dairy- Full Fat Variety. Coconut and Coconut Oil. Dark Chocolate. Nuts. Olive Oil. Egg Yolks. Avocados. Fatty fish. Chia seeds. Dark chocolate. Eggs. Avocado. Flaxseed. Nuts. Nut and seed butter.  Follow-Up: Your next appointment:  1-2 month(s) In Person with Sanda Klein, MD  or Sande Rives, PA-C      At [facility], you and your health needs are our priority.  As part of our continuing mission to provide you with exceptional heart care, we have created designated Provider Care Teams.  These Care Teams include your primary Cardiologist (physician) and Advanced Practice Providers (APPs -  Physician Assistants and Nurse Practitioners) who all work together to provide you with the care you need, when you need it.  Important Information About Sugar

## 2022-03-04 ENCOUNTER — Ambulatory Visit (INDEPENDENT_AMBULATORY_CARE_PROVIDER_SITE_OTHER): Payer: Medicare HMO

## 2022-03-04 DIAGNOSIS — Z8673 Personal history of transient ischemic attack (TIA), and cerebral infarction without residual deficits: Secondary | ICD-10-CM

## 2022-03-04 DIAGNOSIS — I6389 Other cerebral infarction: Secondary | ICD-10-CM

## 2022-03-04 DIAGNOSIS — I428 Other cardiomyopathies: Secondary | ICD-10-CM

## 2022-03-04 DIAGNOSIS — I639 Cerebral infarction, unspecified: Secondary | ICD-10-CM

## 2022-03-04 DIAGNOSIS — I4891 Unspecified atrial fibrillation: Secondary | ICD-10-CM

## 2022-03-04 DIAGNOSIS — E785 Hyperlipidemia, unspecified: Secondary | ICD-10-CM

## 2022-03-04 DIAGNOSIS — I5042 Chronic combined systolic (congestive) and diastolic (congestive) heart failure: Secondary | ICD-10-CM

## 2022-03-04 DIAGNOSIS — I10 Essential (primary) hypertension: Secondary | ICD-10-CM

## 2022-03-05 ENCOUNTER — Telehealth: Payer: Self-pay | Admitting: Cardiovascular Disease

## 2022-03-05 LAB — COMPREHENSIVE METABOLIC PANEL
ALT: 16 IU/L (ref 0–32)
AST: 18 IU/L (ref 0–40)
Albumin/Globulin Ratio: 2 (ref 1.2–2.2)
Albumin: 4.3 g/dL (ref 3.8–4.9)
Alkaline Phosphatase: 77 IU/L (ref 44–121)
BUN/Creatinine Ratio: 8 — ABNORMAL LOW (ref 9–23)
BUN: 7 mg/dL (ref 6–24)
Bilirubin Total: 0.7 mg/dL (ref 0.0–1.2)
CO2: 24 mmol/L (ref 20–29)
Calcium: 9.9 mg/dL (ref 8.7–10.2)
Chloride: 107 mmol/L — ABNORMAL HIGH (ref 96–106)
Creatinine, Ser: 0.92 mg/dL (ref 0.57–1.00)
Globulin, Total: 2.2 g/dL (ref 1.5–4.5)
Glucose: 122 mg/dL — ABNORMAL HIGH (ref 70–99)
Potassium: 4.6 mmol/L (ref 3.5–5.2)
Sodium: 145 mmol/L — ABNORMAL HIGH (ref 134–144)
Total Protein: 6.5 g/dL (ref 6.0–8.5)
eGFR: 73 mL/min/{1.73_m2} (ref >59)

## 2022-03-05 LAB — LIPID PANEL
Chol/HDL Ratio: 3.6 ratio (ref 0.0–4.4)
Cholesterol, Total: 129 mg/dL (ref 100–199)
HDL: 36 mg/dL — ABNORMAL LOW (ref >39)
LDL Chol Calc (NIH): 77 mg/dL (ref 0–99)
Triglycerides: 80 mg/dL (ref 0–149)
VLDL Cholesterol Cal: 16 mg/dL (ref 5–40)

## 2022-03-05 NOTE — Telephone Encounter (Signed)
Calling with critical EKG results for pt. Call transferred

## 2022-03-05 NOTE — Telephone Encounter (Signed)
   Cardiac Monitor Alert  Date of alert:  03/05/2022   [name]  DOB: October 14, 1965  MRN: [id number]   [facility] Cardiologist: Sanda Klein, MD  [facility] EP:  None    Monitor Information: Cardiac Event Monitor [employer]  Reason:  Cryptogenic Stroke Ordering provider:  Sande Rives PA-C, Dr. Sallyanne Kuster to read  Alert  Ventricular Tachycardia 6 [place] with Rate 170BPM This is the 1st alert for this rhythm.   Sinus Rhythm with Bigeminal PVC's Rate 80 BPM 32 PVC's in 1 Minute   Next Cardiology Appointment   Date:  04/23/22  Provider:  Dr. Sallyanne Kuster  Patient does have Cardiac MRI scheduled for 7/28  The patient was contacted today.  She is asymptomatic. Arrhythmia, symptoms and history reviewed with Dr. Claiborne Billings DOD.   Plan:  Have patient to continue to wear monitor. Will forward to Dr. Loletha Grayer for him to review as well.    Other: Strips printed and given to primary nurse.   Chriss Driver, RN  03/05/2022 2:09 PM

## 2022-03-11 ENCOUNTER — Telehealth: Payer: Self-pay | Admitting: *Deleted

## 2022-03-11 NOTE — Telephone Encounter (Signed)
Patient states she is having a reaction to her [employer]ice Cardiac Event Monitor.  She complains of her hands and mouth burning. Expressed to patient we have never had a patient have that type of reaction to an event monitor, but I could have electrodes for sensitive skin shipped to her.  Patient does not want to continue monitor.  She has work it 7-8 days already.  Explained to patient we cannot insist she continue to wear the monitor, especially if she is having an allergic reaction. Patient is going to proceed with an early termination of her cardiac event monitor services.  Results will be imported for review as they are available.

## 2022-03-13 ENCOUNTER — Telehealth (HOSPITAL_COMMUNITY): Payer: Self-pay | Admitting: *Deleted

## 2022-03-13 NOTE — Telephone Encounter (Signed)
Attempted to call patient regarding upcoming cardiac MRI appointment. Left message on voicemail with name and callback number  Maxamus Colao RN Navigator Cardiac Imaging [facility] [phone] Office [phone] Cell  

## 2022-03-14 ENCOUNTER — Ambulatory Visit (HOSPITAL_COMMUNITY): Admission: RE | Admit: 2022-03-14 | Payer: Medicare HMO | Source: Ambulatory Visit

## 2022-03-24 ENCOUNTER — Other Ambulatory Visit: Payer: Self-pay

## 2022-03-24 ENCOUNTER — Telehealth: Payer: Self-pay | Admitting: Cardiovascular Disease

## 2022-03-24 MED ORDER — ASPIRIN 81 MG PO TBEC: 81.0000 mg | DELAYED_RELEASE_TABLET | Freq: Every day | ORAL | 3 refills | Status: DC

## 2022-03-24 MED ORDER — METOPROLOL TARTRATE 100 MG PO TABS: 100.0000 mg | ORAL_TABLET | Freq: Two times a day (BID) | ORAL | 6 refills | Status: DC

## 2022-03-24 NOTE — Telephone Encounter (Addendum)
Daughter said she spoke with RN just a bit ago regarding medications. She had no other questions or concerns at this time.

## 2022-03-24 NOTE — Telephone Encounter (Signed)
Pt  LATOYA IZZARD(DAUGHTER) informed of providers result & recommendations. Pt verbalized understanding. No further questions .

## 2022-03-24 NOTE — Telephone Encounter (Signed)
Pt's daughter is calling back to speak to someone about the conversation that was had with the pt earlier today in regards to the Cardiac Telemetry Monitoring. Requesting call back.

## 2022-04-03 ENCOUNTER — Telehealth: Payer: Self-pay | Admitting: Cardiovascular Disease

## 2022-04-03 NOTE — Telephone Encounter (Signed)
Let's have her stop the Metoprolol tartrate (Lopressor) '100mg'$  twice daily and switch to Metoprolol succinate (Toprol-XL) '150mg'$  once daily and then have her continue to monitor her BP/HR. If BP is consistently above 130/80 or if her HR is dropping below 50 bpm or she is symptomatic (lightheaded, dizzy, etc), she should let us know.  Thanks so much! Bacilio Abascal

## 2022-04-03 NOTE — Telephone Encounter (Signed)
Spoke with pt's daughter, Hubbard Robinson (ok per DPR) regarding pt's elevated blood pressure and low pulse that was noted over the last couple of days after medication changes were made on 8/7 per Sande Rives, PA after her monitor resulted.   Daughter reports blood pressure readings;  8/15: morning: 104/70 73bpm          afternoon: 148/83 53bpm  8/16: morning: 132/78 56bpm        Afternoon: 128/81 64bpm  8/17: morning: 130/74 58bpm         Afternoon: 136/80 55bpm  Despite blood pressure and heart rate readings pt states that she feels good and has not noted any abnormal feelings.  Will route to [place], PA to advise.

## 2022-04-03 NOTE — Telephone Encounter (Signed)
Pt c/o medication issue:  1. Name of Medication:    2. How are you currently taking this medication (dosage and times per day)?    3. Are you having a reaction (difficulty breathing--STAT)? No   4. What is your medication issue? Daughter is calling in because patient bp is still elevated and her pulse is low. After she been off two medication. Please advise

## 2022-04-04 ENCOUNTER — Other Ambulatory Visit: Payer: Self-pay | Admitting: Urology

## 2022-04-04 DIAGNOSIS — D49512 Neoplasm of unspecified behavior of left kidney: Secondary | ICD-10-CM

## 2022-04-07 ENCOUNTER — Telehealth: Payer: Self-pay | Admitting: *Deleted

## 2022-04-07 ENCOUNTER — Telehealth: Payer: Self-pay | Admitting: Cardiovascular Disease

## 2022-04-07 NOTE — Telephone Encounter (Signed)
Patient was switched from Eliquis to Aspirin given improvement in EF per recommendations from visit with Dr. Leta Baptist on 12/31/2021. She has no known CAD so OK to hold Aspirin from our point of view.  Thank you! Leetta Hendriks

## 2022-04-07 NOTE — Telephone Encounter (Signed)
Will route to callback team to find out when surgery is planned for. Still has cardiac MRI planned and has f/u with Dr. Sallyanne Kuster on 04/24/22. If surgery is planned after that date, makes sense to keep visit. Otherwise may need to review with Jesse/Dr. Croitoru to find out if OK to clear sooner. Please also touch base with patient to find out if she wants to reschedule cardiac MRI - was no show for this in 02/2022 per scheduling. Pharm points out that Eliquis was discontinued per 03/24/22 result note on event monitor so please make sure patient is no longer taking this (Eliquis was changed to ASA at that time).

## 2022-04-07 NOTE — Telephone Encounter (Signed)
Will route to pharm for input on anticoag then suspect arrangement of f/u visit would be most appropriate to review most recent concerns in telephone notes and f/u cMRI result.

## 2022-04-07 NOTE — Telephone Encounter (Signed)
Received fax from [facility] re:  Patient has proposed surgery- left robot assisted laparoscopic partial nephrectomy vs radical nephrectomy, general anesthesia, pending clearance but ASAP.  Placed on MD desk. Of note the letter states she is on Eliquis, but eliquis stopped and ASA started per 03/24/22 note- Darreld Mclean PA-C following Cardiac event monitor. Placed on MD desk.

## 2022-04-07 NOTE — Telephone Encounter (Signed)
Per Dr Leta Baptist ok to hold ASA from neurological standpoint but defer to cardiology  to complete their workup. Surgical clearance faxed to [facility], received confirmation.

## 2022-04-07 NOTE — Telephone Encounter (Signed)
Okay to hold aspirin for 5-7 days before the planned surgery.

## 2022-04-07 NOTE — Telephone Encounter (Signed)
Pt does not take anticoagulation according to her medication list. Eliquis was discontinued on 03/24/22.

## 2022-04-07 NOTE — Telephone Encounter (Signed)
I called [facility] and left a message for the surgery scheduler to call our office back with more information about the surgery date.  I also reached out to the patient who states that she is not sure when the surgery is scheduled for but is willing to the the MRI soon if there is a opening. Pt thanked me for the call.

## 2022-04-07 NOTE — Telephone Encounter (Signed)
   Pre-operative Risk Assessment    [name]  DOB: 29-Nov-1965 MRN: [id number]      Request for Surgical Clearance    Procedure:   Left robot assisted laparoscopic left partial nephrectomy versus radical nephrectomy   Date of Surgery:  Clearance TBD                                 Surgeon:  Dr. Rexene Alberts  Surgeon's Group or Practice Name:  [facility]  Phone number:  [phone] Fax number:  [phone]   Type of Clearance Requested:   - Medical  - Pharmacy:  Hold Apixaban (Eliquis) 2-3 days prior    Type of Anesthesia:  General    Additional requests/questions:    Beth[place], [place]   04/07/2022, 10:07 AM

## 2022-04-08 ENCOUNTER — Other Ambulatory Visit: Payer: Self-pay | Admitting: Urology

## 2022-04-08 NOTE — Telephone Encounter (Signed)
Sent this thread to [facility].

## 2022-04-08 NOTE — Telephone Encounter (Signed)
Dorothy Alvarez from [facility] calling back. She says right now they are looking at November 17, but it has not been scheduled.

## 2022-04-09 ENCOUNTER — Telehealth (HOSPITAL_BASED_OUTPATIENT_CLINIC_OR_DEPARTMENT_OTHER): Payer: Self-pay | Admitting: *Deleted

## 2022-04-09 NOTE — Telephone Encounter (Signed)
Spoke with patient's daughter regarding the Wednesday 04/16/22 11:00 am Cardiac MRI appointment---at [facility]---arrival time is 10:30 am 1st floor admissions for check in---informed her the nurse navigator will call on Monday 04/14/22 with further instructions.  She voiced her understanding

## 2022-04-09 NOTE — Telephone Encounter (Signed)
Cardiac MRI has been rescheduled for 04/16/22 per Satira Sark.

## 2022-04-09 NOTE — Telephone Encounter (Signed)
See previous notes; surgery is planned for Nov 2023. Pt has appt 04/23/22 with Dr. Sallyanne Kuster, Clearance to be addressed at appt. See notes about MRI. Looks like pt no showed for her cardiac MRI. I will send a message to [facility] to see if they may be able to help reschedule cardiac MRI.

## 2022-04-10 MED ORDER — METOPROLOL SUCCINATE ER 100 MG PO TB24: 150.0000 mg | ORAL_TABLET | Freq: Every day | ORAL | 1 refills | Status: DC

## 2022-04-10 NOTE — Telephone Encounter (Signed)
Spoke with pt's daugher, Laytoya with Callie's recommendations. New prescription for metoprolol succinate '150mg'$  once daily sent to pharmacy of choice. Instructions given regarding BP/HR. Pt will continue to keep a blood pressure log and bring with them to appointment with Dr. Loletha Grayer on 04/24/22. Daughter verbalizes understanding.

## 2022-04-10 NOTE — Addendum Note (Signed)
Addended by: Janith Lima on: 04/10/2022 08:08 AM   Modules accepted: Orders

## 2022-04-14 ENCOUNTER — Telehealth (HOSPITAL_COMMUNITY): Payer: Self-pay | Admitting: Emergency Medicine

## 2022-04-14 NOTE — Telephone Encounter (Signed)
Reaching out to patient to offer assistance regarding upcoming cardiac imaging study; pt verbalizes understanding of appt date/time, parking situation and where to check in, pre-test NPO status and medications ordered, and verified current allergies; name and call back number provided for further questions should they arise Marchia Bond RN Navigator Cardiac Imaging [facility] [phone] office [phone] cell  Holding lasix  Denies iv issues Denies claustro Denies metal  Arrival 1030 w/c entrance

## 2022-04-16 ENCOUNTER — Ambulatory Visit (HOSPITAL_COMMUNITY)
Admission: RE | Admit: 2022-04-16 | Discharge: 2022-04-16 | Disposition: A | Discharge status: Home / Self Care | Payer: Medicare Other | Source: Ambulatory Visit | Attending: Student | Admitting: Student

## 2022-04-16 ENCOUNTER — Other Ambulatory Visit: Payer: Self-pay | Admitting: Student

## 2022-04-16 DIAGNOSIS — I5042 Chronic combined systolic (congestive) and diastolic (congestive) heart failure: Secondary | ICD-10-CM

## 2022-04-16 DIAGNOSIS — Z8673 Personal history of transient ischemic attack (TIA), and cerebral infarction without residual deficits: Secondary | ICD-10-CM

## 2022-04-16 DIAGNOSIS — I428 Other cardiomyopathies: Secondary | ICD-10-CM

## 2022-04-16 DIAGNOSIS — E785 Hyperlipidemia, unspecified: Secondary | ICD-10-CM

## 2022-04-16 DIAGNOSIS — I10 Essential (primary) hypertension: Secondary | ICD-10-CM

## 2022-04-16 MED ORDER — GADOBUTROL 1 MMOL/ML IV SOLN
10.0000 mL | Freq: Once | INTRAVENOUS | Status: AC | PRN
Start: 2022-04-16 — End: 2022-04-16
  Administered 2022-04-16: 10 mL via INTRAVENOUS

## 2022-04-22 NOTE — Progress Notes (Unsigned)
Cardiology Office Note:    Date:  04/23/2022   ID:  Dorothy Alvarez, DOB 27-Apr-1966, MRN [id number]  PCP:  Nolene Ebbs, MD   [place] [facility] Cardiologist:  Sanda Klein, MD     Referring MD: Nolene Ebbs, MD   Chief Complaint  Patient presents with   Cardiomyopathy    History of Present Illness:    Dorothy Alvarez is a 56 y.o. female with a hx of congestive heart failure, here for follow-up after cardiac MRI.    She initially presented with severely depressed left ventricular systolic function in September 2021 (EF 25-30%, global hypokinesis, grade 3 diastolic dysfunction, mild LVH, mild MR), found to have normal coronary arteries by invasive angiography at that time.  She had a good initial response to guideline directed medical therapy and diuretics but was quickly lost to follow-up.    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  Note that there were also sequelae of previous strokes in the medial right occipital lobe, right posterior hippocampus and left frontoparietal region.  At the time of her stroke, LVEF was less than 20% and estimated systolic PA pressure was 65 mmHg.  Eliquis was prescribed for presumed embolic strokes.    Medical therapy was reinstituted (metoprolol succinate, Entresto) and on the echo from January 24, 2022 there was a substantial improvement in LV systolic function with ejection fraction of 50-55%, although there was worsening left ventricular hypertrophy.  Left heart filling pressures had improved and pulmonary artery pressure was estimated to be normal, around 21 mmHg.  Eliquis has been stopped due to improved left ventricular systolic function.  In August 2023 arrhythmia monitor showed very high  burden of ventricular ectopy (22% of beats were PVCs, rare short episodes of nonsustained VT).  Cardiac MRI performed 04/16/2022 shows findings highly suggestive of cardiac sarcoidosis with several areas of greater than 50% thickness scar involving the mid inferior wall, apical lateral wall and mid anterolateral wall, despite normal coronary arteries, calculated LVEF 42% (normal right ventricle with RVEF 53%).  Previous imaging studies have not shown any evidence of interstitial lung disease or lymphadenopathy.  She does not have a history of uveitis, salivary gland disease, significant rashes or joint problems.  She is here to review the findings of a cardiac MRI and discuss further management.  She is doing much better clinically.  She does not have any shortness of breath climbing a flight of stairs.  She does not have lower extremity edema, orthopnea, PND.  She denies any problems with chest pain at rest or with activity.  She is unaware of palpitations and has not had dizziness or syncope.  There have been no new focal neurologic complaints.  Additional medical problems include possible bipolar disorder, depression anxiety, including hospitalization at behavioral health for suicidal ideation in April 2023, left renal mass noted on CT February 2023,, confirmed on MRI April 2023 (4.3 x 4.5 cm exophytic, enhancing, suspicious for renal cell carcinoma), incidentally noted aortic atherosclerosis without dilation, hypertension, hyperlipidemia.  She has residual short-term memory loss ever since her stroke (she can drive a car, but is unable to remember driving instructions, has good long-term memory).  She is scheduled to undergo an MRI of the abdomen on September 11 with a tentatively scheduled partial nephrectomy versus  radical nephrectomy on November 17 with Dr. Rexene Alberts.  Past Medical History:  Diagnosis Date   Allergy    Anxiety    Arthritis    Bipolar 1 disorder (HCC)    Chronic abdominal  pain    Chronic combined systolic and diastolic CHF (congestive heart failure) (HCC)    Constipation    Depression    Heart failure (HCC)    Hyperlipidemia    Hypertension    IBS (irritable bowel syndrome)    Learning disability    MDD (major depressive disorder)    Metabolic encephalopathy    Neuromuscular disorder (HCC)    Nonischemic cardiomyopathy (HCC)    Rhabdomyolysis    Sepsis ([place])    Stroke [facility])     Past Surgical History:  Procedure Laterality Date   ABDOMINAL HYSTERECTOMY     GALLBLADDER SURGERY     IR GASTROSTOMY TUBE MOD SED  08/07/2021   KNEE SURGERY     RIGHT/LEFT HEART CATH AND CORONARY ANGIOGRAPHY N/A 05/04/2020   Procedure: RIGHT/LEFT HEART CATH AND CORONARY ANGIOGRAPHY;  Surgeon: Belva Crome, MD;  Location: [place] [facility];  Service: Cardiovascular;  Laterality: N/A;    Current Medications: Current Meds  Medication Sig   aspirin EC 81 MG tablet Take 1 tablet (81 mg total) by mouth daily. Swallow whole.   atorvastatin (LIPITOR) 80 MG tablet Take 1 tablet (80 mg total) by mouth daily.   furosemide (LASIX) 40 MG tablet Take 1 tablet (40 mg total) by mouth daily for 120 doses.   isosorbide dinitrate (ISORDIL) 10 MG tablet Take 1 tablet (10 mg total) by mouth 3 (three) times daily.   metoprolol succinate (TOPROL-XL) 100 MG 24 hr tablet Take 1.5 tablets (150 mg total) by mouth daily. Take with or immediately following a meal.   potassium chloride (KLOR-CON) 20 MEQ packet SMARTSIG:1 Packet(s) Gastro Tube Twice Daily   risperiDONE (RISPERDAL M-TABS) 0.5 MG disintegrating tablet Take 1 tablet (0.5 mg total) by mouth at bedtime.   sacubitril-valsartan (ENTRESTO) 49-51 MG Take 1 tablet by mouth 2 (two) times daily.   sertraline (ZOLOFT) 50 MG tablet Take 1 tablet (50 mg total) by mouth daily.     Allergies:   Ketorolac, Morphine and related, Nsaids, and Trazodone and nefazodone   Social History   Socioeconomic History   Marital status: Legally Separated     Spouse name: Not on file   Number of children: 2   Years of education: Not on file   Highest education level: Not on file  Occupational History   Occupation: DISABLED    Employer: UNEMPLOYED  Tobacco Use   Smoking status: Never   Smokeless tobacco: Never  Vaping Use   Vaping Use: Never used  Substance and Sexual Activity   Alcohol use: No   Drug use: Yes    Types: Other-see comments, Marijuana    Comment: opiates   Sexual activity: Not Currently    Birth control/protection: Surgical  Other Topics Concern   Not on file  Social History Narrative   Not on file   Social Determinants of Health   Financial Resource Strain: Not on file  Food Insecurity: Not on file  Transportation Needs: Not on file  Physical Activity: Not on file  Stress: Not on file  Social Connections: Not on file     Family History: The patient's family history includes Depression in her father; Diabetes in her mother; Heart disease in her father; Stroke in her father; Suicidality in her  mother; Transient ischemic attack in her mother.  ROS:   Please see the history of present illness.     All other systems reviewed and are negative.  EKGs/Labs/Other Studies Reviewed:    The following studies were reviewed today: Cardiac MRI 04/16/2022  1. Normal LV size with mild LV hypertrophy. LV EF 42%. Mid inferior severe hypokinesis to akinesis. Mid anterolateral hypokinesis, mid inferolateral akinesis, apical lateral akinesis. >50% wall thickness subendocardial late gadolinium enhancement (LGE) in the basal to mid inferior wall. >50% wall thickness subendocardial LGE in the mid anterolateral wall.   >50% wall thickness subendocardial LGE in the apical lateral wall.   2.  Normal RV size and systolic function, EF 95%.   3. Elevated ECV percentage is likely reflective of the areas of myocardial LGE.   4. LGE pattern as noted above. This looks like a coronary pattern (myocardial infarction) involving RCA and  LCx territories. 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  EKG:  EKG is  ordered today.  The ekg ordered today demonstrates Normal sinus rhythm and generalized low voltage QRS.  There are Q waves in inferior leads III and aVF, but not in lead II.  There is T wave inversion leads V4-V6, 2, 3, aVF.  Normal QTc 416 ms  Recent Labs: 08/11/2021: B Natriuretic Peptide 631.3 12/03/2021: TSH 2.284 02/15/2022: Hemoglobin 15.0; Magnesium 1.9; Platelets 303 03/04/2022: ALT 16; BUN 7; Creatinine, Ser 0.92; Potassium 4.6; Sodium 145  Recent Lipid Panel    Component Value Date/Time   CHOL 129 03/04/2022 0858   TRIG 80 03/04/2022 0858   HDL 36 (L) 03/04/2022 0858   CHOLHDL 3.6 03/04/2022 0858   CHOLHDL 4.6 07/31/2021 0344   VLDL 24 07/31/2021 0344   LDLCALC 77 03/04/2022 0858     Risk Assessment/Calculations:                Physical Exam:    VS:  BP 112/76 (BP Location: Left Arm, Patient Position: Sitting, Cuff Size: Normal)   Pulse 67   Ht '5\' 5"'$  (1.651 m)   Wt 185 lb 3.2 oz (84 kg)   SpO2 96%   BMI 30.82 kg/m     Wt Readings from Last 3 Encounters:  04/23/22 185 lb 3.2 oz (84 kg)  03/03/22 177 lb 6.4 oz (80.5 kg)  02/11/22 185 lb 3.2 oz (84 kg)     GEN: Borderline obese, well nourished, well developed in no acute distress HEENT: Normal NECK: No JVD; No carotid bruits LYMPHATICS: No lymphadenopathy CARDIAC: RRR, no murmurs, rubs, gallops RESPIRATORY:  Clear to auscultation without rales, wheezing or rhonchi  ABDOMEN: Soft, non-tender, non-distended MUSCULOSKELETAL:  No edema; No deformity  SKIN: Warm and dry NEUROLOGIC:  Alert and oriented x 3 PSYCHIATRIC:  Normal affect   ASSESSMENT:    1. Chronic combined systolic and diastolic heart failure ([place])   2. Nonischemic cardiomyopathy ([place])   3. NSVT (nonsustained ventricular tachycardia) ([place])   4. Left renal mass   5. History of stroke   6. Adjustment disorder with depressed mood    PLAN:    In  order of problems listed above:  CHF: Appears clinically euvolemic and feels well.  NYHA functional class I, currently.  On a relatively low dose of daily loop diuretic as well as Entresto and metoprolol and moderate doses.  Is receiving isosorbide dinitrate which was started in part with hydralazine, subsequently stopped.  I am not sure this is making much of a difference, we will continue  it for the time being since she is doing well.  Remarkable improvement in LVEF with medical therapy. CMP: In the setting of normal coronary arteries, the gadolinium enhancing scar is seen in different distributions on her cardiac MRI are strongly suggestive of cardiac sarcoidosis.  There are no extracardiac sites that would confirm this diagnosis (no evidence of lymphadenopathy, salivary gland involvement, uveitis, rashes, etc.).  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  We will schedule for FDG-PET scan to see if there is evidence of active cardiac inflammatory disorder, in which case she might benefit from immunosuppression/anti-inflammatory medications.  If immunosuppressive medications are necessary, would delay starting these until after she has undergone her nephrectomy. PVCs/NSVT: Very high burden of PVCs at 22%, but with only brief and rare episodes of nonsustained VT.  No history of syncope or symptomatic VT.  Suspect these are related to the scar as seen on cardiac MRI.  She is on a relatively high dose of beta-blocker and heart rate is relatively slow.  I do not think additional medications are necessary at this time.  Cannot fully exclude a contribution of PVC cardiomyopathy.  Consider antiarrhythmic therapy after we review the findings on FDG PET. Left renal mass: Highly suspicious for renal cell carcinoma.  Repeat MRI scheduled in a few days.  Tentatively scheduled for surgery on November 17.  Unless there is a change in her current clinical status, I think she is at low risk for major cardiovascular  complications with the planned surgery.  She has very well compensated heart failure.  Would strongly insist on no interruption in her beta-blockers in the perioperative period.  If Entresto interruption is required, this is reasonable. History of stroke: Different territories of ischemic stroke are seen, concerning for embolic events, possibly related to left ventricular thrombus during.  Of severely depressed systolic function.  No longer on anticoagulants due to improved left ventricular systolic function.  Atrial fibrillation has never been documented.  Only residual neurological deficit is short-term memory problems. Mood disorder: Appears very well compensated at this time.           Medication Adjustments/Labs and Tests Ordered: Current medicines are reviewed at length with the patient today.  Concerns regarding medicines are outlined above.  Orders Placed This Encounter  Procedures   EKG 12-Lead   No orders of the defined types were placed in this encounter.   Patient Instructions  Medication Instructions:  No changes *If you need a refill on your cardiac medications before your next appointment, please call your pharmacy*   Lab Work: None ordered If you have labs (blood work) drawn today and your tests are completely normal, you will receive your results only by: [place] (if you have MyChart) OR A paper copy in the mail If you have any lab test that is abnormal or we need to change your treatment, we will call you to review the results.   Testing/Procedures: We will be in touch about the PET scan to be done at [place]: At [facility], you and your health needs are our priority.  As part of our continuing mission to provide you with exceptional heart care, we have created designated Provider Care Teams.  These Care Teams include your primary Cardiologist (physician) and Advanced Practice Providers (APPs -  Physician Assistants and Nurse  Practitioners) who all work together to provide you with the care you need, when you need it.  We recommend signing up for the patient  portal called "MyChart".  Sign up information is provided on this After Visit Summary.  MyChart is used to connect with patients for Virtual Visits (Telemedicine).  Patients are able to view lab/test results, encounter notes, upcoming appointments, etc.  Non-urgent messages can be sent to your provider as well.   To learn more about what you can do with MyChart, go to [email or web address].    Your next appointment:   Keep follow up as planned  Important Information About Sugar         Signed, Sanda Klein, MD  04/23/2022 6:17 PM    [place]

## 2022-04-23 ENCOUNTER — Ambulatory Visit: Payer: Medicare Other | Attending: Cardiovascular Disease | Admitting: Cardiovascular Disease

## 2022-04-23 ENCOUNTER — Encounter: Payer: Self-pay | Admitting: Cardiovascular Disease

## 2022-04-23 VITALS — BP 112/76 | HR 67 | Ht 65.0 in | Wt 185.2 lb

## 2022-04-23 DIAGNOSIS — Z8673 Personal history of transient ischemic attack (TIA), and cerebral infarction without residual deficits: Secondary | ICD-10-CM

## 2022-04-23 DIAGNOSIS — I428 Other cardiomyopathies: Secondary | ICD-10-CM

## 2022-04-23 DIAGNOSIS — I4729 Other ventricular tachycardia: Secondary | ICD-10-CM | POA: Diagnosis not present

## 2022-04-23 DIAGNOSIS — N2889 Other specified disorders of kidney and ureter: Secondary | ICD-10-CM

## 2022-04-23 DIAGNOSIS — F4321 Adjustment disorder with depressed mood: Secondary | ICD-10-CM

## 2022-04-23 DIAGNOSIS — I5042 Chronic combined systolic (congestive) and diastolic (congestive) heart failure: Secondary | ICD-10-CM | POA: Diagnosis not present

## 2022-04-23 NOTE — Patient Instructions (Signed)
Medication Instructions:  No changes *If you need a refill on your cardiac medications before your next appointment, please call your pharmacy*   Lab Work: None ordered If you have labs (blood work) drawn today and your tests are completely normal, you will receive your results only by: [place] (if you have MyChart) OR A paper copy in the mail If you have any lab test that is abnormal or we need to change your treatment, we will call you to review the results.   Testing/Procedures: We will be in touch about the PET scan to be done at [place]: At [facility], you and your health needs are our priority.  As part of our continuing mission to provide you with exceptional heart care, we have created designated Provider Care Teams.  These Care Teams include your primary Cardiologist (physician) and Advanced Practice Providers (APPs -  Physician Assistants and Nurse Practitioners) who all work together to provide you with the care you need, when you need it.  We recommend signing up for the patient portal called "MyChart".  Sign up information is provided on this After Visit Summary.  MyChart is used to connect with patients for Virtual Visits (Telemedicine).  Patients are able to view lab/test results, encounter notes, upcoming appointments, etc.  Non-urgent messages can be sent to your provider as well.   To learn more about what you can do with MyChart, go to [email or web address].    Your next appointment:   Keep follow up as planned  Important Information About Sugar

## 2022-04-28 ENCOUNTER — Ambulatory Visit
Admission: RE | Admit: 2022-04-28 | Discharge: 2022-04-28 | Disposition: A | Discharge status: Home / Self Care | Payer: Medicare Other | Source: Ambulatory Visit | Attending: Urology | Admitting: Urology

## 2022-04-28 DIAGNOSIS — D49512 Neoplasm of unspecified behavior of left kidney: Secondary | ICD-10-CM

## 2022-04-28 MED ORDER — GADOBENATE DIMEGLUMINE 529 MG/ML IV SOLN
18.0000 mL | Freq: Once | INTRAVENOUS | Status: AC | PRN
  Administered 2022-04-28: 18 mL via INTRAVENOUS

## 2022-05-14 ENCOUNTER — Other Ambulatory Visit: Payer: Self-pay | Admitting: Student

## 2022-06-05 ENCOUNTER — Telehealth: Payer: Self-pay | Admitting: Cardiovascular Disease

## 2022-06-05 NOTE — Telephone Encounter (Signed)
Spoke to daughter.  Daughter -Phineas Real stated she was going over patient's medication list with pre-admission surgery.-for ( radical robotic assisted partial nephrectomy vs radical nephrectomy )   She realized patient she has not been given the patient the correct dose of Metoprolol succinate 150 mg  daily as prescribed back on 04/03/22 telephone message.( Change by Sande Rives PA)   Daughter  states patient has been taking  100 mg  metoprolol succinate 100 mg  daily. Daughter states she did not attend patient's visit with Dr Sallyanne Kuster on 04/23/22 her father accompanied the patient. Vital signs on that day was B/P112/76, pulse 67  She does not think he informed anyone of the difference because the daughter handle the medication.   RN asked for some reading blood pressure   Morning                  evening 05/25/22   158/99  p 61              133/6  p 63 05/26/22    127/82 p 55              123/78  p.65 05/27/22  151/99  p. 59             123/71  p.60 05/28/22   155/89  p.56             168/94 p.58 05/29/22   150/91  p.56             148/87 p.60 05/30/22   151/96 p 64              146/85 p 61 05/31/22    137/89 p 65              124/65  p.69 06/01/22    144/96  p 56             149/85 p. 59 06/02/22    145/84  p. 57            152/90  p.64 06/03/22    149/84  p. 57             156/94  p.64 06/04/22   135/94   p. 67             109/78   p.66 06/05/22   153/80  p.55  RN  informed Latoya to continue  with giving  Metoprolol 100 mg until   further notice will  Defer to Dr Sallyanne Kuster

## 2022-06-05 NOTE — Telephone Encounter (Signed)
Pt c/o medication issue:  1. Name of Medication: metoprolol succinate (TOPROL-XL) 100 MG 24 hr tablet  2. How are you currently taking this medication (dosage and times per day)? Take 1.5 tablets (150 mg total) by mouth daily. Take with or immediately following a meal.Patient taking differently: Take 100 mg by mouth daily. Take with or immediately following a meal.  3. Are you having a reaction (difficulty breathing--STAT)? no  4. What is your medication issue? Daughter calling in because she has been giving the patient one table but realize the prescription bottle says 1.5. calling to let the dr know to see if it would make a difference in anything

## 2022-06-05 NOTE — Telephone Encounter (Signed)
I would not increase the metoprolol to 150 mg so soon before her upcoming surgery, but we can do that once she has recovered from the surgery.

## 2022-06-06 NOTE — Telephone Encounter (Signed)
Called spoke to patient's daughter Phineas Real  Direction given to continue with 100 mg Metoprolol Xl until surgery then  once recovery increase start 2 weeks after surgery.  Latoya voiced understanding.

## 2022-06-08 NOTE — Patient Instructions (Signed)
SURGICAL WAITING ROOM VISITATION Patients having surgery or a procedure may have no more than 2 support people in the waiting area - these visitors may rotate in the visitor waiting room.   Children under the age of 19 must have an adult with them who is not the patient. If the patient needs to stay at the hospital during part of their recovery, the visitor guidelines for inpatient rooms apply.  PRE-OP VISITATION  Pre-op nurse will coordinate an appropriate time for 1 support person to accompany the patient in pre-op.  This support person may not rotate.  This visitor will be contacted when the time is appropriate for the visitor to come back in the pre-op area.  Please refer to the [facility] website for the visitor guidelines for Inpatients (after your surgery is over and you are in a regular room).  You are not required to quarantine at this time prior to your surgery. However, you must do this: Hand Hygiene often Do NOT share personal items Notify your provider if you are in close contact with someone who has COVID or you develop fever 100.4 or greater, new onset of sneezing, cough, sore throat, shortness of breath or body aches.  If you test positive for Covid or have been in contact with anyone that has tested positive in the last 10 days please notify you surgeon.    Your procedure is scheduled on:  Friday  June 13, 2022  Report to [facility] Main Entrance: [place] entrance where the [employer] is available.   Report to admitting at:  09:15   AM  +++++Call this number if you have any questions or problems the morning of surgery [phone]  Clear liquid diet the day before your surgery:  Clear Liquid Diet Water Black Coffee (sugar ok, NO MILK/CREAM OR CREAMERS)  Tea (sugar ok, NO MILK/CREAM OR CREAMERS) regular and decaf                             Plain Jell-O  with no fruit (NO RED)                                           Fruit ices (not with fruit pulp,  NO RED)                                     Popsicles (NO RED)                                                                  Juice: apple, WHITE grape, WHITE cranberry Sports drinks like Gatorade or Powerade (NO RED)   DO NOT EAT OR DRINK ANYTHING AFTER MIDNIGHT ON Thursday 06-12-22               FOLLOW BOWEL PREP AND ANY ADDITIONAL PRE OP INSTRUCTIONS YOU RECEIVED FROM YOUR SURGEON'S OFFICE!!!  MIRALAX:  Dissolve 17 grams in 4 ounces of water AND DRINK AT [place]       Oral Hygiene is also  important to reduce your risk of infection.        Remember - BRUSH YOUR TEETH THE MORNING OF SURGERY WITH YOUR REGULAR TOOTHPASTE  Do NOT smoke after Midnight the night before surgery.  Take ONLY these medicines the morning of surgery with A SIP OF WATER: Isosorbide, Sertraline (Zoloft), metoprolol                     You may not have any metal on your body including hair pins, jewelry, and body piercing  Do not wear make-up, lotions, powders, perfumes or deodorant  Do not wear nail polish including gel and S&S, artificial / acrylic nails, or any other type of covering on natural nails including finger and toenails. If you have artificial nails, gel coating, etc., that needs to be removed by a nail salon, Please have this removed prior to surgery. Not doing so may mean that your surgery could be cancelled or delayed if the Surgeon or anesthesia staff feels like they are unable to monitor you safely.   Do not shave 48 hours prior to surgery to avoid nicks in your skin which may contribute to postoperative infections.   Contacts, Hearing Aids, dentures or bridgework may not be worn into surgery.   You may bring a small overnight bag with you on the day of surgery, only pack items that are not valuable .[place] IS NOT RESPONSIBLE   FOR VALUABLES THAT ARE LOST OR STOLEN.   Do not bring your home medications to the hospital. The Pharmacy will dispense medications listed on  your medication list to you during your admission in the Hospital.   Please read over the following fact sheets you were given: IF YOU HAVE QUESTIONS ABOUT YOUR PRE-OP INSTRUCTIONS, PLEASE CALL [phone]  ([place])    - Preparing for Surgery Before surgery, you can play an important role.  Because skin is not sterile, your skin needs to be as free of germs as possible.  You can reduce the number of germs on your skin by washing with CHG (chlorahexidine gluconate) soap before surgery.  CHG is an antiseptic cleaner which kills germs and bonds with the skin to continue killing germs even after washing. Please DO NOT use if you have an allergy to CHG or antibacterial soaps.  If your skin becomes reddened/irritated stop using the CHG and inform your nurse when you arrive at Short Stay. Do not shave (including legs and underarms) for at least 48 hours prior to the first CHG shower.  You may shave your face/neck.  Please follow these instructions carefully:  1.  Shower with CHG Soap the night before surgery and the  morning of surgery.  2.  If you choose to wash your hair, wash your hair first as usual with your normal  shampoo.  3.  After you shampoo, rinse your hair and body thoroughly to remove the shampoo.                             4.  Use CHG as you would any other liquid soap.  You can apply chg directly to the skin and wash.  Gently with a scrungie or clean washcloth.  5.  Apply the CHG Soap to your body ONLY FROM THE NECK DOWN.   Do not use on face/ open  Wound or open sores. Avoid contact with eyes, ears mouth and genitals (private parts).                       Wash face,  Genitals (private parts) with your normal soap.             6.  Wash thoroughly, paying special attention to the area where your  surgery  will be performed.  7.  Thoroughly rinse your body with warm water from the neck down.  8.  DO NOT shower/wash with your normal soap after using and  rinsing off the CHG Soap.            9.  Pat yourself dry with a clean towel.            10.  Wear clean pajamas.            11.  Place clean sheets on your bed the night of your first shower and do not  sleep with pets.  ON THE DAY OF SURGERY : Do not apply any lotions/deodorants the morning of surgery.  Please wear clean clothes to the hospital/surgery center.    FAILURE TO FOLLOW THESE INSTRUCTIONS MAY RESULT IN THE CANCELLATION OF YOUR SURGERY  PATIENT SIGNATURE_________________________________  NURSE SIGNATURE__________________________________  ________________________________________________________________________           Adam Phenix    An incentive spirometer is a tool that can help keep your lungs clear and active. This tool measures how well you are filling your lungs with each breath. Taking long deep breaths may help reverse or decrease the chance of developing breathing (pulmonary) problems (especially infection) following: A long period of time when you are unable to move or be active. BEFORE THE PROCEDURE  If the spirometer includes an indicator to show your best effort, your nurse or respiratory therapist will set it to a desired goal. If possible, sit up straight or lean slightly forward. Try not to slouch. Hold the incentive spirometer in an upright position. INSTRUCTIONS FOR USE  Sit on the edge of your bed if possible, or sit up as far as you can in bed or on a chair. Hold the incentive spirometer in an upright position. Breathe out normally. Place the mouthpiece in your mouth and seal your lips tightly around it. Breathe in slowly and as deeply as possible, raising the piston or the ball toward the top of the column. Hold your breath for 3-5 seconds or for as long as possible. Allow the piston or ball to fall to the bottom of the column. Remove the mouthpiece from your mouth and breathe out normally. Rest for a few seconds and repeat Steps 1  through 7 at least 10 times every 1-2 hours when you are awake. Take your time and take a few normal breaths between deep breaths. The spirometer may include an indicator to show your best effort. Use the indicator as a goal to work toward during each repetition. After each set of 10 deep breaths, practice coughing to be sure your lungs are clear. If you have an incision (the cut made at the time of surgery), support your incision when coughing by placing a pillow or rolled up towels firmly against it. Once you are able to get out of bed, walk around indoors and cough well. You may stop using the incentive spirometer when instructed by your caregiver.  RISKS AND COMPLICATIONS Take your time so you do not get dizzy or  light-headed. If you are in pain, you may need to take or ask for pain medication before doing incentive spirometry. It is harder to take a deep breath if you are having pain. AFTER USE Rest and breathe slowly and easily. It can be helpful to keep track of a log of your progress. Your caregiver can provide you with a simple table to help with this. If you are using the spirometer at home, follow these instructions: [place] IF:  You are having difficultly using the spirometer. You have trouble using the spirometer as often as instructed. Your pain medication is not giving enough relief while using the spirometer. You develop fever of 100.5 F (38.1 C) or higher.                                                                                                    SEEK IMMEDIATE MEDICAL CARE IF:  You cough up bloody sputum that had not been present before. You develop fever of 102 F (38.9 C) or greater. You develop worsening pain at or near the incision site. MAKE SURE YOU:  Understand these instructions. Will watch your condition. Will get help right away if you are not doing well or get worse. Document Released: 12/15/2006 Document Revised: 10/27/2011 Document Reviewed:  02/15/2007 [facility] Patient Information 2014 [place], [place].

## 2022-06-08 NOTE — Progress Notes (Signed)
COVID Vaccine received:  '[x]'$  No '[]'$  Yes Date of any COVID positive Test in last 66 days:None  PCP - Nolene Ebbs, MD Cardiologist - Holland Falling, MD Pulmonology- Andrey Spearman, MD  Chest x-ray -  02-15-22 Epic EKG -  04-23-22  Epic Stress Test -  ECHO - 01-24-22  Epic Cardiac Cath - 05-04-2020  Epic MR cardiac - 04-16-2022  Epic  Pacemaker/ICD device     '[x]'$  N/A Spinal Cord Stimulator:'[x]'$  No '[]'$  Yes      (Remind patient to bring remote DOS) Other Implants:   Bowel Prep - Clears day prior to surgery, Miralax-  Dissolve 17 grams in 4 ounces of water AND DRINK AT NOON DAY BEFORE SURGERY     History of Sleep Apnea? '[x]'$  No '[]'$  Yes   Sleep Study Date:   CPAP used?- '[x]'$  No '[]'$  Yes  (Instruct to bring their mask & Tubing)  Does the patient monitor blood sugar? '[]'$  No '[]'$  Yes  '[x]'$  N/A  Blood Thinner Instructions: Eliquis (stopped 03-19-22 per Sande Rives, PA after monitor results, no A.fib)  Aspirin Instructions:ASA '81mg'$   Ok to hold 5-7 days per Dr. Sallyanne Kuster phone note 04-07-22  ERAS Protocol Ordered: '[x]'$  No  '[]'$  Yes  Comments: Husband or daughter needs to be with her, Patient has short term memory loss. Bipolar 1, vascular dementia,   Activity level: Patient can not climb a flight of stairs without difficulty; '[x]'$  No CP  '[x]'$  No SOB,  Patient can  not perform ADLs without assistance.   Anesthesia review  HF, Hx CVA, NICM, HTN,    Patient denies shortness of breath, fever, cough and chest pain at PAT appointment.  Patient verbalized understanding and agreement to the Pre-Surgical Instructions that were given to them at this PAT appointment. Patient was also educated of the need to review these PAT instructions again prior to his/her surgery.I reviewed the appropriate phone numbers to call if they have any and questions or concerns.

## 2022-06-10 ENCOUNTER — Encounter (HOSPITAL_COMMUNITY): Payer: Self-pay

## 2022-06-10 ENCOUNTER — Other Ambulatory Visit: Payer: Self-pay

## 2022-06-10 ENCOUNTER — Encounter (HOSPITAL_COMMUNITY)
Admission: RE | Admit: 2022-06-10 | Discharge: 2022-06-10 | Disposition: A | Discharge status: Home / Self Care | Payer: Medicare Other | Source: Ambulatory Visit | Attending: Urology | Admitting: Urology

## 2022-06-10 DIAGNOSIS — I110 Hypertensive heart disease with heart failure: Secondary | ICD-10-CM | POA: Insufficient documentation

## 2022-06-10 DIAGNOSIS — Z8673 Personal history of transient ischemic attack (TIA), and cerebral infarction without residual deficits: Secondary | ICD-10-CM | POA: Insufficient documentation

## 2022-06-10 DIAGNOSIS — N2889 Other specified disorders of kidney and ureter: Secondary | ICD-10-CM | POA: Insufficient documentation

## 2022-06-10 DIAGNOSIS — Z01812 Encounter for preprocedural laboratory examination: Secondary | ICD-10-CM | POA: Insufficient documentation

## 2022-06-10 DIAGNOSIS — I493 Ventricular premature depolarization: Secondary | ICD-10-CM | POA: Insufficient documentation

## 2022-06-10 DIAGNOSIS — I5042 Chronic combined systolic (congestive) and diastolic (congestive) heart failure: Secondary | ICD-10-CM | POA: Insufficient documentation

## 2022-06-10 HISTORY — DX: Other amnesia: R41.3

## 2022-06-10 LAB — CBC
HCT: 42.5 % (ref 36.0–46.0)
Hemoglobin: 13.6 g/dL (ref 12.0–15.0)
MCH: 31.1 pg (ref 26.0–34.0)
MCHC: 32 g/dL (ref 30.0–36.0)
MCV: 97.3 fL (ref 80.0–100.0)
Platelets: 317 10*3/uL (ref 150–400)
RBC: 4.37 MIL/uL (ref 3.87–5.11)
RDW: 12.1 % (ref 11.5–15.5)
WBC: 6.7 10*3/uL (ref 4.0–10.5)
nRBC: 0 % (ref 0.0–0.2)

## 2022-06-10 LAB — BASIC METABOLIC PANEL
Anion gap: 7 (ref 5–15)
BUN: 13 mg/dL (ref 6–20)
CO2: 30 mmol/L (ref 22–32)
Calcium: 9.3 mg/dL (ref 8.9–10.3)
Chloride: 108 mmol/L (ref 98–111)
Creatinine, Ser: 0.85 mg/dL (ref 0.44–1.00)
GFR, Estimated: >60 mL/min (ref >60)
Glucose, Bld: 76 mg/dL (ref 70–99)
Potassium: 4.2 mmol/L (ref 3.5–5.1)
Sodium: 145 mmol/L (ref 135–145)

## 2022-06-11 NOTE — Progress Notes (Signed)
Anesthesia Chart Review   Case: [id number] Date/Time: 06/13/22 1115   Procedure: XI ROBOTIC ASSITED PARTIAL NEPHRECTOMY VERSUS RADICAL NEPHRECTOMY (Left) - ONLY NEEDS 240 MIN   Anesthesia type: General   Pre-op diagnosis: LEFT RENAL MASS   Location[facility] ROOM 05 / [facility]   Surgeons: Janith Lima, MD       DISCUSSION:56 y.o. never smoker with h/o HTN, CHF, CVA, PVCs, left renal mass scheduled for above procedure 06/13/2022 with Dr. Rexene Alberts.   Pt last seen by cardiology 04/23/2022. Per OV note, "Left renal mass: Highly suspicious for renal cell carcinoma.  Repeat MRI scheduled in a few days.  Tentatively scheduled for surgery on November 17.  Unless there is a change in her current clinical status, I think she is at low risk for major cardiovascular complications with the planned surgery.  She has very well compensated heart failure.  Would strongly insist on no interruption in her beta-blockers in the perioperative period.  If Entresto interruption is required, this is reasonable."  Anticipate pt can proceed with planned procedure barring acute status change.   VS: BP 136/84 Comment: right arm sitting  Pulse 78   Temp 36.6 C (Oral)   Resp 16   Ht '5\' 6"'$  (1.676 m)   Wt 83.9 kg   SpO2 100%   BMI 29.86 kg/m   PROVIDERS: Nolene Ebbs, MD is PCP   Cardiologist - Holland Falling, MD  Neurology- Andrey Spearman, MD LABS: Labs reviewed: Acceptable for surgery. (all labs ordered are listed, but only abnormal results are displayed)  Labs Reviewed  CBC  BASIC METABOLIC PANEL  TYPE AND SCREEN     IMAGES:   EKG:   CV: Echo 01/17/2022 1. Left ventricular ejection fraction, by estimation, is 50 to 55%. The  left ventricle has low normal function. The left ventricle has no regional  wall motion abnormalities. There is severe concentric left ventricular  hypertrophy. Left ventricular  diastolic parameters are consistent with Grade I diastolic dysfunction  (impaired relaxation).    2. Right ventricular systolic function is mildly reduced. The right  ventricular size is normal. There is normal pulmonary artery systolic  pressure.   3. Left atrial size was severely dilated.   4. A small pericardial effusion is present. The pericardial effusion is  anterior to the right ventricle.   5. The mitral valve is normal in structure. Trivial mitral valve  regurgitation. No evidence of mitral stenosis.   6. The aortic valve is tricuspid. Aortic valve regurgitation is not  visualized. No aortic stenosis is present.   7. Aortic dilatation noted. There is borderline dilatation of the  ascending aorta, measuring 36 mm.   8. The inferior vena cava is normal in size with greater than 50%  respiratory variability, suggesting right atrial pressure of 3 mmHg.  Past Medical History:  Diagnosis Date   Allergy    Anxiety    Arthritis    Bipolar 1 disorder (HCC)    Chronic abdominal pain    Chronic combined systolic and diastolic CHF (congestive heart failure) (HCC)    Constipation    Depression    Heart failure (HCC)    Hyperlipidemia    Hypertension    IBS (irritable bowel syndrome)    Learning disability    MDD (major depressive disorder)    Memory loss    since her CVA   Metabolic encephalopathy    Neuromuscular disorder ([place])    Nonischemic cardiomyopathy (HCC)    Rhabdomyolysis  Sepsis ([place])    Stroke [facility])     Past Surgical History:  Procedure Laterality Date   ABDOMINAL HYSTERECTOMY     GALLBLADDER SURGERY     IR GASTROSTOMY TUBE MOD SED  08/07/2021   KNEE SURGERY     RIGHT/LEFT HEART CATH AND CORONARY ANGIOGRAPHY N/A 05/04/2020   Procedure: RIGHT/LEFT HEART CATH AND CORONARY ANGIOGRAPHY;  Surgeon: Belva Crome, MD;  Location: [place] CV LAB;  Service: Cardiovascular;  Laterality: N/A;    MEDICATIONS:  acetaminophen (TYLENOL) 325 MG tablet   aspirin EC 81 MG tablet   atorvastatin (LIPITOR) 80 MG tablet   furosemide (LASIX) 40 MG tablet    isosorbide dinitrate (ISORDIL) 10 MG tablet   MELATONIN PO   metoprolol succinate (TOPROL-XL) 100 MG 24 hr tablet   potassium chloride (KLOR-CON) 20 MEQ packet   risperiDONE (RISPERDAL M-TABS) 0.5 MG disintegrating tablet   risperiDONE (RISPERDAL) 0.5 MG tablet   sacubitril-valsartan (ENTRESTO) 49-51 MG   sertraline (ZOLOFT) 50 MG tablet   No current facility-administered medications for this encounter.     Konrad Felix Ward, PA-C [facility]-Surgical Testing [phone]

## 2022-06-12 ENCOUNTER — Other Ambulatory Visit: Payer: Self-pay | Admitting: Student

## 2022-06-12 ENCOUNTER — Encounter (HOSPITAL_COMMUNITY): Payer: Self-pay | Admitting: Urology

## 2022-06-12 NOTE — Op Note (Deleted)
Wrong note type

## 2022-06-12 NOTE — Anesthesia Preprocedure Evaluation (Addendum)
Anesthesia Evaluation  Patient identified by MRN, date of birth, ID band Patient awake    Reviewed: Allergy & Precautions, NPO status , Patient's Chart, lab work & pertinent test results  Airway Mallampati: I  TM Distance: >3 FB Neck ROM: Full    Dental  (+) Teeth Intact, Dental Advisory Given   Pulmonary neg pulmonary ROS,    breath sounds clear to auscultation       Cardiovascular hypertension, Pt. on medications and Pt. on home beta blockers +CHF   Rhythm:Regular Rate:Normal     Neuro/Psych PSYCHIATRIC DISORDERS Anxiety Depression Bipolar Disorder Dementia  Neuromuscular disease CVA    GI/Hepatic negative GI ROS, Neg liver ROS,   Endo/Other  negative endocrine ROS  Renal/GU negative Renal ROS     Musculoskeletal  (+) Arthritis ,   Abdominal Normal abdominal exam  (+)   Peds  Hematology negative hematology ROS (+)   Anesthesia Other Findings   Reproductive/Obstetrics                            Anesthesia Physical Anesthesia Plan  ASA: 3  Anesthesia Plan: General   Post-op Pain Management:    Induction: Intravenous  PONV Risk Score and Plan: 4 or greater and Ondansetron, Dexamethasone and Treatment may vary due to age or medical condition  Airway Management Planned: Oral ETT  Additional Equipment: None  Intra-op Plan:   Post-operative Plan: Extubation in OR  Informed Consent: I have reviewed the patients History and Physical, chart, labs and discussed the procedure including the risks, benefits and alternatives for the proposed anesthesia with the patient or authorized representative who has indicated his/her understanding and acceptance.     Dental advisory given  Plan Discussed with: CRNA  Anesthesia Plan Comments: (- 2 IV's)       Anesthesia Quick Evaluation

## 2022-06-12 NOTE — Telephone Encounter (Signed)
This is Dr. Croitoru's pt 

## 2022-06-12 NOTE — H&P (Deleted)
Wrong note type

## 2022-06-13 ENCOUNTER — Ambulatory Visit (HOSPITAL_COMMUNITY): Payer: Medicare Other | Admitting: Certified Registered Nurse Anesthetist

## 2022-06-13 ENCOUNTER — Ambulatory Visit (HOSPITAL_COMMUNITY): Payer: Medicare Other | Admitting: Physician Assistant

## 2022-06-13 ENCOUNTER — Encounter (HOSPITAL_COMMUNITY): Payer: Self-pay | Admitting: Urology

## 2022-06-13 ENCOUNTER — Inpatient Hospital Stay (HOSPITAL_COMMUNITY)
Admission: RE | Admit: 2022-06-13 | Discharge: 2022-06-16 | DRG: 658 | Disposition: A | Discharge status: Home / Self Care | Payer: Medicare Other | Attending: Urology | Admitting: Urology

## 2022-06-13 ENCOUNTER — Other Ambulatory Visit: Payer: Self-pay

## 2022-06-13 ENCOUNTER — Encounter (HOSPITAL_COMMUNITY)
Admission: RE | Disposition: A | Discharge status: Home / Self Care | Payer: Self-pay | Source: Home / Self Care | Attending: Urology

## 2022-06-13 DIAGNOSIS — I110 Hypertensive heart disease with heart failure: Secondary | ICD-10-CM

## 2022-06-13 DIAGNOSIS — Z8673 Personal history of transient ischemic attack (TIA), and cerebral infarction without residual deficits: Secondary | ICD-10-CM | POA: Diagnosis not present

## 2022-06-13 DIAGNOSIS — E669 Obesity, unspecified: Secondary | ICD-10-CM | POA: Diagnosis present

## 2022-06-13 DIAGNOSIS — Z833 Family history of diabetes mellitus: Secondary | ICD-10-CM

## 2022-06-13 DIAGNOSIS — N2889 Other specified disorders of kidney and ureter: Secondary | ICD-10-CM | POA: Diagnosis present

## 2022-06-13 DIAGNOSIS — R509 Fever, unspecified: Secondary | ICD-10-CM | POA: Diagnosis not present

## 2022-06-13 DIAGNOSIS — Z818 Family history of other mental and behavioral disorders: Secondary | ICD-10-CM

## 2022-06-13 DIAGNOSIS — Z8249 Family history of ischemic heart disease and other diseases of the circulatory system: Secondary | ICD-10-CM

## 2022-06-13 DIAGNOSIS — F418 Other specified anxiety disorders: Secondary | ICD-10-CM | POA: Diagnosis not present

## 2022-06-13 DIAGNOSIS — Z6829 Body mass index (BMI) 29.0-29.9, adult: Secondary | ICD-10-CM | POA: Diagnosis not present

## 2022-06-13 DIAGNOSIS — D3002 Benign neoplasm of left kidney: Principal | ICD-10-CM | POA: Diagnosis present

## 2022-06-13 DIAGNOSIS — Z90710 Acquired absence of both cervix and uterus: Secondary | ICD-10-CM | POA: Diagnosis not present

## 2022-06-13 DIAGNOSIS — Z01812 Encounter for preprocedural laboratory examination: Secondary | ICD-10-CM | POA: Diagnosis not present

## 2022-06-13 DIAGNOSIS — I509 Heart failure, unspecified: Secondary | ICD-10-CM | POA: Diagnosis present

## 2022-06-13 DIAGNOSIS — I5042 Chronic combined systolic (congestive) and diastolic (congestive) heart failure: Secondary | ICD-10-CM

## 2022-06-13 DIAGNOSIS — F319 Bipolar disorder, unspecified: Secondary | ICD-10-CM | POA: Diagnosis present

## 2022-06-13 DIAGNOSIS — Z823 Family history of stroke: Secondary | ICD-10-CM

## 2022-06-13 DIAGNOSIS — N200 Calculus of kidney: Principal | ICD-10-CM

## 2022-06-13 HISTORY — PX: ROBOTIC ASSITED PARTIAL NEPHRECTOMY: SHX6087

## 2022-06-13 LAB — TYPE AND SCREEN
ABO/RH(D): AB POS
Antibody Screen: NEGATIVE

## 2022-06-13 LAB — GLUCOSE, CAPILLARY: Glucose-Capillary: 105 mg/dL — ABNORMAL HIGH (ref 70–99)

## 2022-06-13 LAB — HEMOGLOBIN AND HEMATOCRIT, BLOOD
HCT: 43.7 % (ref 36.0–46.0)
Hemoglobin: 14.2 g/dL (ref 12.0–15.0)

## 2022-06-13 LAB — ABO/RH: ABO/RH(D): AB POS

## 2022-06-13 SURGERY — NEPHRECTOMY, PARTIAL, ROBOT-ASSISTED
Anesthesia: General | Laterality: Left

## 2022-06-13 MED ORDER — SODIUM CHLORIDE 0.45 % IV SOLN: INTRAVENOUS | Status: DC

## 2022-06-13 MED ORDER — PROPOFOL 10 MG/ML IV BOLUS
INTRAVENOUS | Status: DC | PRN
  Administered 2022-06-13: 110 mg via INTRAVENOUS

## 2022-06-13 MED ORDER — STERILE WATER FOR IRRIGATION IR SOLN
Status: DC | PRN
  Administered 2022-06-13: 1000 mL

## 2022-06-13 MED ORDER — ROCURONIUM BROMIDE 10 MG/ML (PF) SYRINGE
PREFILLED_SYRINGE | INTRAVENOUS | Status: DC | PRN
  Administered 2022-06-13: 30 mg via INTRAVENOUS
  Administered 2022-06-13: 20 mg via INTRAVENOUS
  Administered 2022-06-13: 10 mg via INTRAVENOUS
  Administered 2022-06-13 (×2): 30 mg via INTRAVENOUS
  Administered 2022-06-13: 70 mg via INTRAVENOUS

## 2022-06-13 MED ORDER — ONDANSETRON HCL 4 MG/2ML IJ SOLN
INTRAMUSCULAR | Status: AC
  Filled 2022-06-13: qty 2

## 2022-06-13 MED ORDER — PROPOFOL 1000 MG/100ML IV EMUL
INTRAVENOUS | Status: AC
  Filled 2022-06-13: qty 100

## 2022-06-13 MED ORDER — AMISULPRIDE (ANTIEMETIC) 5 MG/2ML IV SOLN
INTRAVENOUS | Status: AC
  Filled 2022-06-13: qty 4

## 2022-06-13 MED ORDER — FENTANYL CITRATE (PF) 100 MCG/2ML IJ SOLN
INTRAMUSCULAR | Status: DC | PRN
  Administered 2022-06-13: 50 ug via INTRAVENOUS
  Administered 2022-06-13: 100 ug via INTRAVENOUS
  Administered 2022-06-13 (×2): 50 ug via INTRAVENOUS
  Administered 2022-06-13: 100 ug via INTRAVENOUS

## 2022-06-13 MED ORDER — POLYETHYLENE GLYCOL 3350 17 G PO PACK: 17.0000 g | PACK | Freq: Every day | ORAL | Status: DC

## 2022-06-13 MED ORDER — ORAL CARE MOUTH RINSE: 15.0000 mL | Freq: Once | OROMUCOSAL | Status: AC

## 2022-06-13 MED ORDER — CHLORHEXIDINE GLUCONATE 0.12 % MT SOLN
15.0000 mL | Freq: Once | OROMUCOSAL | Status: AC
  Administered 2022-06-13: 15 mL via OROMUCOSAL

## 2022-06-13 MED ORDER — ATORVASTATIN CALCIUM 40 MG PO TABS
80.0000 mg | ORAL_TABLET | Freq: Every day | ORAL | Status: DC
  Administered 2022-06-14 – 2022-06-16 (×3): 80 mg via ORAL
  Filled 2022-06-13 (×4): qty 2

## 2022-06-13 MED ORDER — ROCURONIUM BROMIDE 10 MG/ML (PF) SYRINGE
PREFILLED_SYRINGE | INTRAVENOUS | Status: AC
  Filled 2022-06-13: qty 10

## 2022-06-13 MED ORDER — HYDROMORPHONE HCL 1 MG/ML IJ SOLN: 0.2500 mg | INTRAMUSCULAR | Status: DC | PRN

## 2022-06-13 MED ORDER — ISOSORBIDE DINITRATE 10 MG PO TABS
10.0000 mg | ORAL_TABLET | Freq: Three times a day (TID) | ORAL | Status: DC
  Administered 2022-06-13 – 2022-06-16 (×9): 10 mg via ORAL
  Filled 2022-06-13 (×9): qty 1

## 2022-06-13 MED ORDER — CEFAZOLIN SODIUM-DEXTROSE 2-4 GM/100ML-% IV SOLN
2.0000 g | Freq: Once | INTRAVENOUS | Status: AC
  Administered 2022-06-13: 2 g via INTRAVENOUS
  Filled 2022-06-13: qty 100

## 2022-06-13 MED ORDER — SERTRALINE HCL 50 MG PO TABS
50.0000 mg | ORAL_TABLET | Freq: Every day | ORAL | Status: DC
  Administered 2022-06-14 – 2022-06-16 (×3): 50 mg via ORAL
  Filled 2022-06-13 (×3): qty 1

## 2022-06-13 MED ORDER — RISPERIDONE 0.5 MG PO TABS
0.5000 mg | ORAL_TABLET | Freq: Every day | ORAL | Status: DC
  Administered 2022-06-13 – 2022-06-15 (×3): 0.5 mg via ORAL
  Filled 2022-06-13 (×3): qty 1

## 2022-06-13 MED ORDER — PHENYLEPHRINE 80 MCG/ML (10ML) SYRINGE FOR IV PUSH (FOR BLOOD PRESSURE SUPPORT)
PREFILLED_SYRINGE | INTRAVENOUS | Status: AC
  Filled 2022-06-13: qty 10

## 2022-06-13 MED ORDER — DIPHENHYDRAMINE HCL 12.5 MG/5ML PO ELIX
12.5000 mg | ORAL_SOLUTION | Freq: Four times a day (QID) | ORAL | Status: DC | PRN
  Administered 2022-06-14 – 2022-06-15 (×2): 25 mg via ORAL
  Filled 2022-06-13 (×2): qty 10

## 2022-06-13 MED ORDER — LACTATED RINGERS IV SOLN: INTRAVENOUS | Status: DC | PRN

## 2022-06-13 MED ORDER — PHENYLEPHRINE 80 MCG/ML (10ML) SYRINGE FOR IV PUSH (FOR BLOOD PRESSURE SUPPORT)
PREFILLED_SYRINGE | INTRAVENOUS | Status: DC | PRN
  Administered 2022-06-13 (×3): 80 ug via INTRAVENOUS

## 2022-06-13 MED ORDER — METOPROLOL SUCCINATE ER 100 MG PO TB24
100.0000 mg | ORAL_TABLET | Freq: Every day | ORAL | Status: DC
  Administered 2022-06-14 – 2022-06-16 (×3): 100 mg via ORAL
  Filled 2022-06-13 (×3): qty 1

## 2022-06-13 MED ORDER — DEXAMETHASONE SODIUM PHOSPHATE 4 MG/ML IJ SOLN
INTRAMUSCULAR | Status: DC | PRN
  Administered 2022-06-13: 5 mg via INTRAVENOUS

## 2022-06-13 MED ORDER — ACETAMINOPHEN 160 MG/5ML PO SOLN: 325.0000 mg | Freq: Once | ORAL | Status: DC | PRN

## 2022-06-13 MED ORDER — EPHEDRINE SULFATE-NACL 50-0.9 MG/10ML-% IV SOSY
PREFILLED_SYRINGE | INTRAVENOUS | Status: DC | PRN
  Administered 2022-06-13: 5 mg via INTRAVENOUS

## 2022-06-13 MED ORDER — TRAMADOL HCL 50 MG PO TABS
50.0000 mg | ORAL_TABLET | Freq: Four times a day (QID) | ORAL | Status: DC | PRN
  Administered 2022-06-15: 50 mg via ORAL
  Filled 2022-06-13: qty 1

## 2022-06-13 MED ORDER — FENTANYL CITRATE (PF) 250 MCG/5ML IJ SOLN
INTRAMUSCULAR | Status: AC
  Filled 2022-06-13: qty 5

## 2022-06-13 MED ORDER — ACETAMINOPHEN 325 MG PO TABS: 325.0000 mg | ORAL_TABLET | Freq: Once | ORAL | Status: DC | PRN

## 2022-06-13 MED ORDER — HEMOSTATIC AGENTS (NO CHARGE) OPTIME
TOPICAL | Status: DC | PRN
  Administered 2022-06-13: 1 via TOPICAL

## 2022-06-13 MED ORDER — PROMETHAZINE HCL 25 MG/ML IJ SOLN: 6.2500 mg | INTRAMUSCULAR | Status: DC | PRN

## 2022-06-13 MED ORDER — DEXAMETHASONE SODIUM PHOSPHATE 10 MG/ML IJ SOLN
INTRAMUSCULAR | Status: AC
  Filled 2022-06-13: qty 1

## 2022-06-13 MED ORDER — LACTATED RINGERS IV SOLN: INTRAVENOUS | Status: DC

## 2022-06-13 MED ORDER — SODIUM CHLORIDE (PF) 0.9 % IJ SOLN
INTRAMUSCULAR | Status: DC | PRN
  Administered 2022-06-13: 20 mL

## 2022-06-13 MED ORDER — DIPHENHYDRAMINE HCL 50 MG/ML IJ SOLN
12.5000 mg | Freq: Four times a day (QID) | INTRAMUSCULAR | Status: DC | PRN
  Administered 2022-06-13: 25 mg via INTRAVENOUS
  Filled 2022-06-13: qty 1

## 2022-06-13 MED ORDER — EPHEDRINE 5 MG/ML INJ
INTRAVENOUS | Status: AC
  Filled 2022-06-13: qty 5

## 2022-06-13 MED ORDER — LIDOCAINE HCL (PF) 2 % IJ SOLN
INTRAMUSCULAR | Status: DC | PRN
  Administered 2022-06-13: 1 mg/kg/h via INTRADERMAL

## 2022-06-13 MED ORDER — ACETAMINOPHEN 10 MG/ML IV SOLN: 1000.0000 mg | Freq: Once | INTRAVENOUS | Status: DC | PRN

## 2022-06-13 MED ORDER — BUPIVACAINE LIPOSOME 1.3 % IJ SUSP
INTRAMUSCULAR | Status: DC | PRN
  Administered 2022-06-13: 20 mL

## 2022-06-13 MED ORDER — FENTANYL CITRATE (PF) 100 MCG/2ML IJ SOLN
INTRAMUSCULAR | Status: AC
  Filled 2022-06-13: qty 2

## 2022-06-13 MED ORDER — HYDROMORPHONE HCL 1 MG/ML IJ SOLN: 0.5000 mg | INTRAMUSCULAR | Status: DC | PRN

## 2022-06-13 MED ORDER — ONDANSETRON HCL 4 MG/2ML IJ SOLN
4.0000 mg | INTRAMUSCULAR | Status: DC | PRN
  Administered 2022-06-13: 4 mg via INTRAVENOUS
  Filled 2022-06-13: qty 2

## 2022-06-13 MED ORDER — LIDOCAINE 2% (20 MG/ML) 5 ML SYRINGE
INTRAMUSCULAR | Status: DC | PRN
  Administered 2022-06-13: 70 mg via INTRAVENOUS

## 2022-06-13 MED ORDER — PROPOFOL 500 MG/50ML IV EMUL
INTRAVENOUS | Status: DC | PRN
  Administered 2022-06-13: 25 ug/kg/min via INTRAVENOUS

## 2022-06-13 MED ORDER — HYOSCYAMINE SULFATE 0.125 MG SL SUBL: 0.1250 mg | SUBLINGUAL_TABLET | SUBLINGUAL | Status: DC | PRN

## 2022-06-13 MED ORDER — TRAMADOL HCL 50 MG PO TABS: 50.0000 mg | ORAL_TABLET | Freq: Four times a day (QID) | ORAL | 0 refills | Status: DC | PRN

## 2022-06-13 MED ORDER — DOCUSATE SODIUM 100 MG PO CAPS: 100.0000 mg | ORAL_CAPSULE | Freq: Two times a day (BID) | ORAL | Status: DC

## 2022-06-13 MED ORDER — ONDANSETRON HCL 4 MG/2ML IJ SOLN
INTRAMUSCULAR | Status: DC | PRN
  Administered 2022-06-13: 4 mg via INTRAVENOUS

## 2022-06-13 MED ORDER — LACTATED RINGERS IR SOLN
Status: DC | PRN
  Administered 2022-06-13: 1000 mL

## 2022-06-13 MED ORDER — PHENYLEPHRINE HCL-NACL 20-0.9 MG/250ML-% IV SOLN
INTRAVENOUS | Status: DC | PRN
  Administered 2022-06-13: 25 ug/min via INTRAVENOUS

## 2022-06-13 MED ORDER — AMISULPRIDE (ANTIEMETIC) 5 MG/2ML IV SOLN
10.0000 mg | Freq: Once | INTRAVENOUS | Status: AC | PRN
  Administered 2022-06-13: 10 mg via INTRAVENOUS

## 2022-06-13 MED ORDER — SODIUM CHLORIDE (PF) 0.9 % IJ SOLN
INTRAMUSCULAR | Status: AC
  Filled 2022-06-13: qty 20

## 2022-06-13 MED ORDER — HYDRALAZINE HCL 20 MG/ML IJ SOLN
5.0000 mg | INTRAMUSCULAR | Status: DC | PRN
  Administered 2022-06-13: 5 mg via INTRAVENOUS

## 2022-06-13 MED ORDER — MEPERIDINE HCL 50 MG/ML IJ SOLN: 6.2500 mg | INTRAMUSCULAR | Status: DC | PRN

## 2022-06-13 MED ORDER — DOCUSATE SODIUM 100 MG PO CAPS
100.0000 mg | ORAL_CAPSULE | Freq: Two times a day (BID) | ORAL | Status: DC
  Administered 2022-06-13 – 2022-06-16 (×7): 100 mg via ORAL
  Filled 2022-06-13 (×7): qty 1

## 2022-06-13 MED ORDER — LIDOCAINE HCL (PF) 2 % IJ SOLN
INTRAMUSCULAR | Status: AC
  Filled 2022-06-13: qty 5

## 2022-06-13 MED ORDER — BUPIVACAINE LIPOSOME 1.3 % IJ SUSP
INTRAMUSCULAR | Status: AC
  Filled 2022-06-13: qty 20

## 2022-06-13 MED ORDER — HYDRALAZINE HCL 20 MG/ML IJ SOLN
INTRAMUSCULAR | Status: AC
  Filled 2022-06-13: qty 1

## 2022-06-13 MED ORDER — SUGAMMADEX SODIUM 200 MG/2ML IV SOLN
INTRAVENOUS | Status: DC | PRN
  Administered 2022-06-13: 200 mg via INTRAVENOUS

## 2022-06-13 MED ORDER — ACETAMINOPHEN 10 MG/ML IV SOLN
1000.0000 mg | Freq: Four times a day (QID) | INTRAVENOUS | Status: AC
  Administered 2022-06-13 – 2022-06-14 (×4): 1000 mg via INTRAVENOUS
  Filled 2022-06-13 (×4): qty 100

## 2022-06-13 SURGICAL SUPPLY — 80 items
ADH SKN CLS APL DERMABOND .7 (GAUZE/BANDAGES/DRESSINGS) ×1
AGENT HMST KT MTR STRL THRMB (HEMOSTASIS) ×1
APL ESCP 34 STRL LF DISP (HEMOSTASIS) ×1
APL LAPSCP 35 DL APL RGD (MISCELLANEOUS)
APL PRP STRL LF DISP 70% ISPRP (MISCELLANEOUS) ×1
APPLICATOR SURGIFLO ENDO (HEMOSTASIS) IMPLANT
APPLICATOR VISTASEAL 35 (MISCELLANEOUS) ×1 IMPLANT
BAG COUNTER SPONGE SURGICOUNT (BAG) IMPLANT
BAG LAPAROSCOPIC 12 15 PORT 16 (BASKET) IMPLANT
BAG RETRIEVAL 12/15 (BASKET) ×1
BAG SPNG CNTER NS LX DISP (BAG)
CHLORAPREP W/TINT 26 (MISCELLANEOUS) ×1 IMPLANT
CLIP LIGATING HEM O LOK PURPLE (MISCELLANEOUS) ×1 IMPLANT
CLIP LIGATING HEMO LOK XL GOLD (MISCELLANEOUS) IMPLANT
CLIP LIGATING HEMO O LOK GREEN (MISCELLANEOUS) ×2 IMPLANT
CLIP SUT LAPRA TY ABSORB (SUTURE) ×2 IMPLANT
COVER SURGICAL LIGHT HANDLE (MISCELLANEOUS) ×1 IMPLANT
COVER TIP SHEARS 8 DVNC (MISCELLANEOUS) ×1 IMPLANT
COVER TIP SHEARS 8MM DA VINCI (MISCELLANEOUS) ×1
CUTTER ECHEON FLEX ENDO 45 340 (ENDOMECHANICALS) IMPLANT
DERMABOND ADVANCED .7 DNX12 (GAUZE/BANDAGES/DRESSINGS) ×1 IMPLANT
DRAIN CHANNEL 15F RND FF 3/16 (WOUND CARE) IMPLANT
DRAPE ARM DVNC X/XI (DISPOSABLE) ×4 IMPLANT
DRAPE COLUMN DVNC XI (DISPOSABLE) ×1 IMPLANT
DRAPE DA VINCI XI ARM (DISPOSABLE) ×4
DRAPE DA VINCI XI COLUMN (DISPOSABLE) ×1
DRAPE INCISE IOBAN 66X45 STRL (DRAPES) ×1 IMPLANT
DRAPE SHEET LG 3/4 BI-LAMINATE (DRAPES) ×1 IMPLANT
ELECT PENCIL ROCKER SW 15FT (MISCELLANEOUS) ×1 IMPLANT
ELECT REM PT RETURN 15FT ADLT (MISCELLANEOUS) ×1 IMPLANT
EVACUATOR SILICONE 100CC (DRAIN) IMPLANT
GLOVE BIO SURGEON STRL SZ 6.5 (GLOVE) ×1 IMPLANT
GLOVE BIOGEL PI IND STRL 7.5 (GLOVE) ×2 IMPLANT
GLOVE SURG LX STRL 7.5 STRW (GLOVE) ×2 IMPLANT
GOWN SRG XL LVL 4 BRTHBL STRL (GOWNS) ×1 IMPLANT
GOWN STRL NON-REIN XL LVL4 (GOWNS) ×1
GOWN STRL REUS W/ TWL LRG LVL3 (GOWN DISPOSABLE) ×2 IMPLANT
GOWN STRL REUS W/TWL LRG LVL3 (GOWN DISPOSABLE) ×2
HEMOSTAT SURGICEL 4X8 (HEMOSTASIS) ×1 IMPLANT
HOLDER FOLEY CATH W/STRAP (MISCELLANEOUS) ×1 IMPLANT
IRRIG SUCT STRYKERFLOW 2 WTIP (MISCELLANEOUS) ×1
IRRIGATION SUCT STRKRFLW 2 WTP (MISCELLANEOUS) ×1 IMPLANT
KIT BASIN OR (CUSTOM PROCEDURE TRAY) ×1 IMPLANT
KIT TURNOVER KIT A (KITS) IMPLANT
LOOP VESSEL MAXI BLUE (MISCELLANEOUS) ×1 IMPLANT
MARKER SKIN DUAL TIP RULER LAB (MISCELLANEOUS) ×1 IMPLANT
NDL INSUFFLATION 14GA 120MM (NEEDLE) ×1 IMPLANT
NEEDLE INSUFFLATION 14GA 120MM (NEEDLE) ×1 IMPLANT
PAD POSITIONING PINK XL (MISCELLANEOUS) ×1 IMPLANT
PROTECTOR NERVE ULNAR (MISCELLANEOUS) ×2 IMPLANT
RELOAD STAPLE 45 2.6 WHT THIN (STAPLE) IMPLANT
SEAL CANN UNIV 5-8 DVNC XI (MISCELLANEOUS) ×4 IMPLANT
SEAL XI 5MM-8MM UNIVERSAL (MISCELLANEOUS) ×4
SET TUBE SMOKE EVAC HIGH FLOW (TUBING) ×1 IMPLANT
SOLUTION ELECTROLUBE (MISCELLANEOUS) ×1 IMPLANT
SPIKE FLUID TRANSFER (MISCELLANEOUS) ×1 IMPLANT
STAPLE RELOAD 45 WHT (STAPLE) ×4 IMPLANT
STAPLE RELOAD 45MM WHITE (STAPLE) ×4
SURGIFLO W/THROMBIN 8M KIT (HEMOSTASIS) IMPLANT
SUT ETHILON 3 0 PS 1 (SUTURE) ×1 IMPLANT
SUT MNCRL AB 4-0 PS2 18 (SUTURE) ×2 IMPLANT
SUT PDS AB 0 CT1 36 (SUTURE) IMPLANT
SUT PROLENE 4 0 RB 1 (SUTURE) ×1
SUT PROLENE 4-0 RB1 .5 CRCL 36 (SUTURE) ×1 IMPLANT
SUT VIC AB 2-0 SH 27 (SUTURE) ×6
SUT VIC AB 2-0 SH 27XBRD (SUTURE) ×6 IMPLANT
SUT VICRYL 0 UR6 27IN ABS (SUTURE) IMPLANT
SUT VLOC 3-0 9IN GRN (SUTURE) IMPLANT
SUT VLOC BARB 180 ABS3/0GR12 (SUTURE) ×2
SUTURE VLOC BRB 180 ABS3/0GR12 (SUTURE) ×2 IMPLANT
SYR CONTROL 10ML LL (SYRINGE) IMPLANT
SYS BAG RETRIEVAL 10MM (BASKET) ×1
SYSTEM BAG RETRIEVAL 10MM (BASKET) ×1 IMPLANT
TOWEL OR 17X26 10 PK STRL BLUE (TOWEL DISPOSABLE) ×1 IMPLANT
TOWEL OR NON WOVEN STRL DISP B (DISPOSABLE) ×1 IMPLANT
TRAY FOLEY MTR SLVR 16FR STAT (SET/KITS/TRAYS/PACK) ×1 IMPLANT
TRAY LAPAROSCOPIC (CUSTOM PROCEDURE TRAY) ×1 IMPLANT
TROCAR Z THREAD OPTICAL 12X100 (TROCAR) IMPLANT
TROCAR Z-THREAD OPTICAL 5X100M (TROCAR) IMPLANT
WATER STERILE IRR 1000ML POUR (IV SOLUTION) ×1 IMPLANT

## 2022-06-13 NOTE — Progress Notes (Signed)
  Transition of Care [facility]) Screening Note   Patient Details  Name: Dorothy Alvarez Date of Birth: [birth year]   [facility] of [facility]) CM/SW Contact:    Dessa Phi, RN Phone Number: 06/13/2022, 3:32 PM    Transition of Care Department [facility]) has reviewed patient and no TOC needs have been identified at this time. We will continue to monitor patient advancement through interdisciplinary progression rounds. If new patient transition needs arise, please place a TOC consult.

## 2022-06-13 NOTE — Transfer of Care (Signed)
Immediate Anesthesia Transfer of Care Note  Patient: Dorothy Alvarez  Procedure(s) Performed: XI ROBOTIC ASSITED RADICAL NEPHRECTOMY (Left)  Patient Location: PACU  Anesthesia Type:General  Level of Consciousness: awake and patient cooperative  Airway & Oxygen Therapy: Patient Spontanous Breathing and Patient connected to face mask  Post-op Assessment: Report given to RN and Post -op Vital signs reviewed and stable  Post vital signs: Reviewed and stable  Last Vitals:  Vitals Value Taken Time  BP 164/123 06/13/22 1101  Temp    Pulse 80 06/13/22 1101  Resp 22 06/13/22 1102  SpO2 91 % 06/13/22 1101  Vitals shown include unvalidated device data.  Last Pain:  Vitals:   06/13/22 0612  TempSrc:   PainSc: 0-No pain      Patients Stated Pain Goal: 3 (71/59/53 9672)  Complications: No notable events documented.

## 2022-06-13 NOTE — Op Note (Signed)
Operative Note  Preoperative diagnosis:  1.  Left renal mass  Postoperative diagnosis: 1.  Left renal mass  Procedure(s): 1.  Robotic assisted laparoscopic left radical nephrectomy with left adrenalectomy  Surgeon: Rexene Alberts, MD  Assistants:  Debbrah Alar, PA  An assistant was required for this surgical procedure.  The duties of the assistant included but were not limited to suctioning, passing suture, camera manipulation, retraction.  This procedure would not be able to be performed without an Environmental consultant.   Anesthesia:  General  Complications:  None  EBL:  231m  Specimens: 1.  ID Type Source Tests Collected by Time Destination  1 : Left Kidney Tissue PATH GU tumor resection SURGICAL PATHOLOGY GJanith Lima MD 06/13/2022 1029    Drains/Catheters: 1.  16 Fr foley catheter  Intraoperative findings:   Large superior pole renal mass measuring over 5cm with innumerable parasitic vessels. Possible involvement with left adrenal gland. Elected to proceed with left radical nephrectomy with adrenalectomy to ensure cancer control. 1 left renal artery and 1 left renal vein each taken with stapler.  Excellent hemostasis  Indication:  Dorothy Poudrieris a 56year old female with a left renal mass initially diagnosed in 2022.  MRI kidney 11/18/2021 with approximately 5 cm anterior to upper pole left renal mass consistent with renal cell carcinoma.  No evidence of renal vein involvement or metastatic disease.  We are planning on proceeding with intervention several months ago however she has multiple comorbidities and had delay in obtaining cardiac and neurology clearance.  She does have a history of CHF, multiple strokes, hypertension, bipolar disorder and obesity.  She also may obtain preoperative clearance from cardiology.  She is no longer taking Eliquis and remain on aspirin.  Repeat MRI kidney 04/28/2022 with increased size of a 5 cm solid enhancing left insert upper pole lower renal mass  compatible with renal cell carcinoma.  Again no evidence of renal vein tumor involvement or metastatic disease.  Plan is to proceed with either partial nephrectomy versus radical nephrectomy given the large size and complexity of the mass.  Description of procedure: The indications, alternatives, benefits and risks were discussed with the patient and informed consent was obtained.  The patient was brought onto the operating room table, positioned supine and secured to the bed with a safety strap.  All pressure points were carefully padded and pneumatic compression devices were placed on the lower extremities.  After the administration of intravenous antibiotics and general endotracheal anesthesia, a 16 French urethral catheter was inserted to drain the bladder.  The patient was repositioned in the right lateral decubitus with the left side elevated at a 70 degree angle with the right lower leg flexed and the left upper leg extended.  An axillary roll was positioned to protect the brachial plexus and a gel pad was placed to support the back.  Multiple pillows were used to pad beneath and between the lower extremities and to ensure adequate cushioning. The left arm was placed in an armrest and carefully padded. The patient was secured in place across the hips, chest and legs with foam padding and silk tape, and the table was flexed.  The patient was prepped and draped in the standard sterile manner.  The radiographic images were in the room.  Timeout was completed verifying the correct patient, surgical procedure, site and positioning, prior to beginning the procedure.  Pneumoperitoneum was introduced by placing a Veress needle just lateral to the rectus belly into the abdomen and insufflating with  CO2 to a pressure of 15 mmHg.  Initial pass was a Veress needle obtained and elevated pressure and the second pass was performed with optimal opening pressure less than 9 mm in room and insufflated without difficulty.  The camera trocar was placed approximately 7-1/2 cm inferior and 2 cm medially to the planned position of the left robotic arm which was approximately 2 fingerbreadths inferior to the costal margin.  The 0 degree lens was inserted under direct visualization.  The abdominal cavity was examined for any signs of injury, adhesions and identification of anatomic landmarks. We then placed our right robotic trocar, left robotic trocar and fourth arm trocar.  A 12 mm assistant port was placed periumbilically.  The robot was then docked.  She did have an adhesion from her prior G-tube site that was left in place as this was well out of the way of our procedure.  The white line of Toldt was incised and the colon was reflected medially, exposing Gerota's fascia. The splenorenal nad splenophrenic ligaments were incised to mobilize the spleen. The tail of the pancreas was mobilized medially.  Of note, the tail the pancreas was initially adhered to the large upper pole left renal mass and tedious dissection was performed to mobilize the pancreas without injury.  The retroperitoneal fascia overlying the renal vessels was carefully separated, exposing the underlying renal vein.  I was able to achieve an adequate lift using the fourth arm to elevate the kidney, further exposing the hilum.  The left renal vein and left renal artery were carefully dissected and mobilized.  I then use intraoperative ultrasound and then surveyed the large renal mass.  I did free Gerota's fascia around the mass.  The mass was quite tethered to the upper pole and the adrenal gland.  After mobilizing the pancreas off of the mass, I was concerned that there could be cancer in the adrenal gland.  The mass was very large over 5 cm and had parasitic vessels that are each carefully ligated.  At this point, we elected proceed with a left radical nephrectomy with adrenalectomy.  I did place a single white clip proximal in the left renal artery.  Next, A  laparoscopic battery-operated stapler with a vascular load was used to control and ligate the left renal artery.  A separate vascular load was used to secure the left renal vein.  We did have to ligate the gonadal vein in order to fully mobilize the hilum.  I then mobilized the left upper pole and used a battery operated stable to take the left adrenal vein and surrounding tissue.  The left ureter was divided between clips.   The kidney was placed in an Endo Catch bag.  The renal fossa and remainder of the operating field were inspected for bleeding or injury.  The insufflation pressure was reduced, again confirming the absence of bleeding.  Excellent hemostasis was obtained.  A mini-Gibson incision was made at the site of the fourth arm trocar.  The fascia was opened and the specimen was removed in a muscle-sparing fashion.  The ports were removed under direct vision.  Carter-Thomason device was used to close the 12 mm port.  All wounds were copiously irrigated and then infiltrated with Exparel.  The muscle was reapproximated using 0 Vicryl as a first layer. 1-0 PDS was used to close the fascia as a second layer.  The skin incisions were reapproximated with 4-0 Monocryl.  Dermabond was then applied on the skin.  At the  end of the procedure, all counts were correct.  The patient tolerated the procedure well and was taken to the recovery room in satisfactory condition.  Matt R. [facility]  Pager: [phone]

## 2022-06-13 NOTE — Anesthesia Postprocedure Evaluation (Signed)
Anesthesia Post Note  Patient: Tilley Faeth  Procedure(s) Performed: XI ROBOTIC ASSITED RADICAL NEPHRECTOMY (Left)     Patient location during evaluation: PACU Anesthesia Type: General Level of consciousness: awake and alert Pain management: pain level controlled Vital Signs Assessment: post-procedure vital signs reviewed and stable Respiratory status: spontaneous breathing, nonlabored ventilation, respiratory function stable and patient connected to nasal cannula oxygen Cardiovascular status: blood pressure returned to baseline and stable Postop Assessment: no apparent nausea or vomiting Anesthetic complications: no   No notable events documented.  Last Vitals:  Vitals:   06/13/22 1300 06/13/22 1332  BP: (!) 144/92 127/76  Pulse: 65 (!) 57  Resp:  18  Temp:  36.7 C  SpO2: 96% 100%                 Effie Berkshire

## 2022-06-13 NOTE — Anesthesia Procedure Notes (Signed)
Procedure Name: Intubation Date/Time: 06/13/2022 7:34 AM  Performed by: Claudia Desanctis, CRNAPre-anesthesia Checklist: Patient identified, Emergency Drugs available, Suction available and Patient being monitored Patient Re-evaluated:Patient Re-evaluated prior to induction Oxygen Delivery Method: Circle system utilized Preoxygenation: Pre-oxygenation with 100% oxygen Induction Type: IV induction Ventilation: Mask ventilation without difficulty Laryngoscope Size: Miller and 3 Grade View: Grade I Tube type: Oral Tube size: 7.0 mm Number of attempts: 1 Airway Equipment and Method: Stylet Placement Confirmation: ETT inserted through vocal cords under direct vision, positive ETCO2 and breath sounds checked- equal and bilateral Secured at: 21 cm Tube secured with: Tape Dental Injury: Teeth and Oropharynx as per pre-operative assessment

## 2022-06-13 NOTE — H&P (Signed)
Office Visit Report     06/03/2022   --------------------------------------------------------------------------------   Dorothy Alvarez     CC/HPI: Pt presents today for pre-operative history and physical exam in anticipation of robotic assisted lap left partial vs radical nephrectomy by Dr. Abner Greenspan on 06/13/22. She is doing well and is without complaint.   Surgical clearance obtained from cards and neurology. Ok per cards to stop ASA 5 days prior to surgery.   Pt denies F/C, HA, CP, SOB, N/V, diarrhea/constipation, back pain, flank pain, hematuria, and dysuria.      HX:   Dorothy Alvarez is a 56 year old female seen in followup today for a left renal mass.   1. Left renal mass:  -Of note, patient was admitted in 07/2021 with a past medical history of obesity, bipolar disorder, hypertension, CHF and found to be in septic shock due to Enterococcus faecalis as well as suffering multiple strokes. Acute ischemic stroke. She initially had dysphagia and had a G-tube placed which is since been removed. She has made a full functional recovery and denies any residual weakness or numbness. She is tolerating a regular diet.  -CT A/P without contrast 07/30/2021 demonstrated a 4.7 cm soft tissue mass interposed between the gastric fundus in the upper pole of the left kidney. At that time, differential including exophytic renal mass, gastric diverticulum or pancreatic tail mass.  -Follow-up 10/08/2021 CT A/P with IV contrast demonstrated 5.1 cm enhancing solid mass inseparable from the lateral margin the upper pole of the left kidney. There is also a 13 mm low-density lesion in the anterior aspect the left kidney. She also found to have a 4.2 cm structure with peripheral nodular enhancement in the right lobe of the liver suggesting hemangioma.  -Follow-up MRI abdomen 11/18/2021 demonstrated left renal mass measuring 4.5 cm with no renal vein involvement or metastatic disease. There was a more caudal focus of  hypoenhancement within the interpolar left kidney favored to represent scarring from prior ischemic insult.  -I initially attempted to schedule partial nephrectomy versus radical nephrectomy in 11/2021 however she did not receive clearance from neurology and cardiology.  -CXR 02/15/2022 NED  -She denies abdominal pain or flank pain. She denies gross hematuria. She denies a personal history of malignancy. She denies a family history of urinary malignancy.  -Creatinine on 10/08/2021 was 0.85 with estimated GFR 60.   Patient currently denies fever, chills, sweats, nausea, vomiting, abdominal or flank pain, gross hematuria or dysuria.   She does have a past medical history of obesity, bipolar disorder, hypertension, CHF, history of multiple strokes in 07/2021.  Echo in 07/2021 demonstrated ejection fraction less than 20%.  She has a history of a hysterectomy and G-tube placement which is since been removed.  She is currently taking Eliquis for recent history of stroke.     ALLERGIES: Morphine Sulfate - Itching Penicillin - doesn't remember reaction    MEDICATIONS: Atorvastatin Calcium 80 mg tablet  Carvedilol 6.25 mg tablet  Clonidine Hcl 0.1 mg tablet  Entresto 24 mg-26 mg tablet  Furosemide 80 mg tablet  Isosorbide Dinitrate 10 mg tablet  Potassium Chloride 20 meq packet  Quetiapine Fumarate 200 mg tablet     Notes: ASA '81mg'$  po QD   GU PSH: Cystoscopy Hydrodistention - 2011 Hysterectomy Unilat SO - 2011       PSH Notes: Cystoscopy With Dilation Of Bladder, Knee Arthroscopy, Hysterectomy   NON-GU PSH: None   GU PMH: Left renal neoplasm - 04/03/2022, - 11/26/2021, - 10/22/2021 Pelvic/perineal pain, Female pelvic  pain - 2014 Unspecified ovarian cysts, Ovarian cyst - 2014      PMH Notes:  2009-09-20 07:28:12 - Note: Normal Routine History And Physical Adult   NON-GU PMH: Hypokalemia, Hypokalemia - 2014 Personal history of other diseases of the circulatory system, History of  hypertension - 2014 Personal history of other mental and behavioral disorders, History of depression - 2014, History of depression, - 2014 Congestive heart failure Hypertension Stroke/TIA    FAMILY HISTORY: 1 Daughter - Runs in Family 1 son - Runs in Family Diabetes - Mother Family Health Status - Mother's Age - Runs In Family Family Health Status Number - Runs In Family Father Deceased At Age86 ___ - Runs In Family Hypertension - Runs in Family Stroke Syndrome - Father   SOCIAL HISTORY: Marital Status: Married Ethnicity: Not Hispanic Or Latino; Race: Black or African American Current Smoking Status: Patient has never smoked.   Tobacco Use Assessment Completed: Used Tobacco in last 30 days? Does not use smokeless tobacco. Has never drank.  Does not use drugs. Does not drink caffeine. Has not had a blood transfusion.     Notes: Never A Smoker, Caffeine Use, Occupation:, Marital History - Single, Alcohol Use, Tobacco Use   REVIEW OF SYSTEMS:    GU Review Female:   Patient denies frequent urination, hard to postpone urination, burning /pain with urination, get up at night to urinate, leakage of urine, stream starts and stops, trouble starting your stream, have to strain to urinate, and being pregnant.  Gastrointestinal (Upper):   Patient denies nausea, vomiting, and indigestion/ heartburn.  Gastrointestinal (Lower):   Patient denies diarrhea and constipation.  Constitutional:   Patient denies fever, night sweats, weight loss, and fatigue.  Skin:   Patient denies skin rash/ lesion and itching.  Eyes:   Patient denies double vision and blurred vision.  Ears/ Nose/ Throat:   Patient denies sore throat and sinus problems.  Hematologic/Lymphatic:   Patient denies swollen glands and easy bruising.  Cardiovascular:   Patient denies leg swelling and chest pains.  Respiratory:   Patient denies cough and shortness of breath.  Endocrine:   Patient denies excessive thirst.  Musculoskeletal:    Patient denies back pain and joint pain.  Neurological:   Patient denies headaches and dizziness.  Psychologic:   Patient denies depression and anxiety.   VITAL SIGNS:      06/03/2022 02:13 PM  Weight 180 lb / 81.65 kg  Height 66 in / 167.64 cm  BP 119/78 mmHg  Pulse 66 /min  Temperature 96.9 F / 36.0 C  BMI 29.0 kg/m   MULTI-SYSTEM PHYSICAL EXAMINATION:    Constitutional: Well-nourished. No physical deformities. Normally developed. Good grooming.  Neck: Neck symmetrical, not swollen. Normal tracheal position.  Respiratory: Normal breath sounds. No labored breathing, no use of accessory muscles.   Cardiovascular: Regular rate and rhythm. No murmur, no gallop.   Lymphatic: No enlargement of neck, axillae, groin.  Skin: No paleness, no jaundice, no cyanosis. No lesion, no ulcer, no rash.  Neurologic / Psychiatric: Oriented to time, oriented to place, oriented to person. No depression, no anxiety, no agitation.  Gastrointestinal: No mass, no tenderness, no rigidity, obese abdomen.   Eyes: Normal conjunctivae. Normal eyelids.  Ears, Nose, Mouth, and Throat: Left ear no scars, no lesions, no masses. Right ear no scars, no lesions, no masses. Nose no scars, no lesions, no masses. Normal hearing. Normal lips.  Musculoskeletal: Normal gait and station of head and neck.  Complexity of Data:  Records Review:   Previous Patient Records  Urine Test Review:   Urinalysis   06/03/22  Urinalysis  Urine Appearance Clear   Urine Color Yellow   Urine Glucose Neg mg/dL  Urine Bilirubin Neg mg/dL  Urine Ketones Neg mg/dL  Urine Specific Gravity 1.015   Urine Blood Neg ery/uL  Urine pH 6.0   Urine Protein Neg mg/dL  Urine Urobilinogen 0.2 mg/dL  Urine Nitrites Neg   Urine Leukocyte Esterase Neg leu/uL   PROCEDURES:          Urinalysis - 81003 Dipstick Dipstick Cont'd  Color: Yellow Bilirubin: Neg mg/dL  Appearance: Clear Ketones: Neg mg/dL  Specific Gravity: 1.015 Blood: Neg ery/uL   pH: 6.0 Protein: Neg mg/dL  Glucose: Neg mg/dL Urobilinogen: 0.2 mg/dL    Nitrites: Neg    Leukocyte Esterase: Neg leu/uL    ASSESSMENT:      ICD-10 Details  1 GU:   Left renal neoplasm - S50.539    PLAN:            Medications Stop Meds: Eliquis 5 mg tablet  Discontinue: 06/03/2022  - Reason: The medication cycle was completed.            Schedule Return Visit/Planned Activity: Keep Scheduled Appointment - Schedule Surgery          Document Letter(s):  Created for Patient: Clinical Summary         Notes:   There are no changes in the patients history or physical exam since last evaluation by Dr. Abner Greenspan. Pt is scheduled to undergo left RAL partial vs radical nephrectomy.   All pt's questions were answered to the best of my ability.          Next Appointment:      Next Appointment: 06/13/2022 11:30 AM    Appointment Type: Surgery     Location: [facility] [phone]    Provider: Rexene Alberts, M.D.    Reason for Visit: WL EXT REC LT RA LAP PARTIAL NEPHRECTOMY VS RADICAL NEPHRECTOMY WITH [facility]   Urology Preoperative H&P   Chief Complaint: Left renal mass  History of Present Illness: Dorothy Alvarez is a 56 y.o. female with a left renal mass here for left robotic assisted laparoscopic partial vs radical nephrectomy. Denies fevers, chills. Obtained cardiology and neurology clearance.    Past Medical History:  Diagnosis Date   Allergy    Anxiety    Arthritis    Bipolar 1 disorder (HCC)    Chronic abdominal pain    Chronic combined systolic and diastolic CHF (congestive heart failure) (HCC)    Constipation    Depression    Heart failure (HCC)    Hyperlipidemia    Hypertension    IBS (irritable bowel syndrome)    Learning disability    MDD (major depressive disorder)    Memory loss    since her CVA   Metabolic encephalopathy    Neuromuscular disorder (HCC)    Nonischemic cardiomyopathy (HCC)    Rhabdomyolysis    Sepsis ([place])    Stroke [facility])      Past Surgical History:  Procedure Laterality Date   ABDOMINAL HYSTERECTOMY     GALLBLADDER SURGERY     IR GASTROSTOMY TUBE MOD SED  08/07/2021   KNEE SURGERY     RIGHT/LEFT HEART CATH AND CORONARY ANGIOGRAPHY N/A 05/04/2020   Procedure: RIGHT/LEFT HEART CATH AND CORONARY ANGIOGRAPHY;  Surgeon: Belva Crome, MD;  Location: [facility] INVASIVE CV  LAB;  Service: Cardiovascular;  Laterality: N/A;    Allergies:  Allergies  Allergen Reactions   Ketorolac Other (See Comments)    Anxiety and confusion   Morphine And Related Other (See Comments)    Mental status changes (confusion and anxiety)   Nsaids Nausea Only   Penicillins Itching    Has patient had a PCN reaction causing immediate rash, facial/tongue/throat swelling, SOB or lightheadedness with hypotension: Yes Has patient had a PCN reaction causing severe rash involving mucus membranes or skin necrosis: No Has patient had a PCN reaction that required hospitalization No Has patient had a PCN reaction occurring within the last 10 years: No If all of the above answers are "NO", then may proceed with Cephalosporin use.    Trazodone And Nefazodone Other (See Comments)    Restlessness and opposite/paradoxical reaction/effect    Family History  Problem Relation Age of Onset   Diabetes Mother    Suicidality Mother    Transient ischemic attack Mother    Heart disease Father    Depression Father    Stroke Father     Social History:  reports that she has never smoked. She has never used smokeless tobacco. She reports current drug use. Drugs: Other-see comments and Marijuana. She reports that she does not drink alcohol.  ROS: A complete review of systems was performed.  All systems are negative except for pertinent findings as noted.  Physical Exam:  Vital signs in last 24 hours: Temp:  [98.3 F (36.8 C)] 98.3 F (36.8 C) (10/27 0537) Pulse Rate:  [66] 66 (10/27 0537) Resp:  [16] 16 (10/27 0537) BP: (168-180)/(95-106) 168/95  (10/27 0541) SpO2:  [100 %] 100 % (10/27 0537) Weight:  [83.9 kg] 83.9 kg (10/27 0612) Constitutional:  Alert and oriented, No acute distress Cardiovascular: Regular rate and rhythm Respiratory: Normal respiratory effort, Lungs clear bilaterally GI: Abdomen is soft, nontender, nondistended, no abdominal masses GU: No CVA tenderness Lymphatic: No lymphadenopathy Neurologic: Grossly intact, no focal deficits Psychiatric: Normal mood and affect  Laboratory Data:  Recent Labs    06/10/22 1030  WBC 6.7  HGB 13.6  HCT 42.5  PLT 317    Recent Labs    06/10/22 1030  NA 145  K 4.2  CL 108  GLUCOSE 76  BUN 13  CALCIUM 9.3  CREATININE 0.85     Results for orders placed or performed during the hospital encounter of 06/13/22 (from the past 24 hour(s))  ABO/Rh     Status: None   Collection Time: 06/13/22  6:08 AM  Result Value Ref Range   ABO/RH(D)      AB POS Performed at [facility], [place] [address], [place], [place] [address]    No results found for this or any previous visit (from the past 240 hour(s)).  Renal Function: Recent Labs    06/10/22 1030  CREATININE 0.85   Estimated Creatinine Clearance: 79.7 mL/min (by C-G formula based on SCr of 0.85 mg/dL).  Radiologic Imaging: No results found.  I independently reviewed the above imaging studies.  Assessment and Plan Amos Gaber is a 56 y.o. female with a left renal mass here for left robotic assisted laparoscopic partial vs radical nephrectomy.   We have discussed the risks of treatment in detail including but not limited to bleeding, infection, heart attack, stroke, death, venothromoboembolism, cancer recurrence, injury/damage to surrounding organs and structures, urine leak, the possibility of open surgical conversion for patients undergoing minimally invasive surgery, the risk of  developing chronic kidney disease and its associated implications, and the potential risk of end stage renal disease  possibly necessitating dialysis.     Matt R. Chief Walkup MD 06/13/2022, [facility] Pager: [phone]): [phone]

## 2022-06-13 NOTE — Discharge Instructions (Signed)

## 2022-06-14 ENCOUNTER — Encounter (HOSPITAL_COMMUNITY): Payer: Self-pay | Admitting: Urology

## 2022-06-14 LAB — HEMOGLOBIN AND HEMATOCRIT, BLOOD
HCT: 36.7 % (ref 36.0–46.0)
Hemoglobin: 12.1 g/dL (ref 12.0–15.0)

## 2022-06-14 LAB — BASIC METABOLIC PANEL
Anion gap: 8 (ref 5–15)
BUN: 16 mg/dL (ref 6–20)
CO2: 25 mmol/L (ref 22–32)
Calcium: 8.4 mg/dL — ABNORMAL LOW (ref 8.9–10.3)
Chloride: 109 mmol/L (ref 98–111)
Creatinine, Ser: 1.61 mg/dL — ABNORMAL HIGH (ref 0.44–1.00)
GFR, Estimated: 37 mL/min — ABNORMAL LOW (ref >60)
Glucose, Bld: 87 mg/dL (ref 70–99)
Potassium: 3 mmol/L — ABNORMAL LOW (ref 3.5–5.1)
Sodium: 142 mmol/L (ref 135–145)

## 2022-06-14 MED ORDER — ACETAMINOPHEN 325 MG PO TABS
650.0000 mg | ORAL_TABLET | Freq: Four times a day (QID) | ORAL | Status: DC | PRN
  Administered 2022-06-14 – 2022-06-15 (×2): 650 mg via ORAL
  Filled 2022-06-14 (×2): qty 2

## 2022-06-14 NOTE — Progress Notes (Signed)
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 Path pending.  2 - Low Grade Fever - Tm 100.4 POD 1 after uncomplicated nephrectomy. No localizing symptoms.  Today "Dorothy Alvarez" is stable. Low grade temp over night. No tachycardia. Cr 1.6. Good UOP   Objective: Vital signs in last 24 hours: Temp:  [97.8 F (36.6 C)-100.4 F (38 C)] 100 F (37.8 C) (10/28 0501) Pulse Rate:  [54-75] 66 (10/28 0501) Resp:  [10-20] 19 (10/28 0501) BP: (108-176)/(69-109) 124/69 (10/28 0501) SpO2:  [92 %-100 %] 92 % (10/28 0501) Last BM Date : 06/12/22  Intake/Output from previous day: 10/27 0701 - 10/28 0700 In: 3357.4 [I.V.:2957.4; IV Piggyback:400] Out: 1175 [Urine:1150; Blood:25] Intake/Output this shift: No intake/output data recorded.  NAD, AOx3, watching TV Non-labored breathign on RA, no cough/congestion RRR SNTND, recent surgical sites c/d/I, no drainage / erythema Foley in place with non-foul urine No c/c/e.   Lab Results:  Recent Labs    06/13/22 1257 06/14/22 0524  HGB 14.2 12.1  HCT 43.7 36.7   BMET Recent Labs    06/14/22 0524  NA 142  K 3.0*  CL 109  CO2 25  GLUCOSE 87  BUN 16  CREATININE 1.61*  CALCIUM 8.4*   PT/INR No results for input(s): "LABPROT", "INR" in the last 72 hours. ABG No results for input(s): "PHART", "HCO3" in the last 72 hours.  Invalid input(s): "PCO2", "PO2"  Studies/Results: No results found.  Anti-infectives: Anti-infectives (From admission, onward)    Start     Dose/Rate Route Frequency Ordered Stop   06/13/22 0530  ceFAZolin (ANCEF) IVPB 2g/100 mL premix        2 g 200 mL/hr over 30 Minutes Intravenous  Once 06/13/22 0527 06/13/22 0746       Assessment/Plan:  Ambulate, IS, DC foley, SLIV, Reg diet. Goals for DC discussed, likely tomorrow fi continues to progress and no fever progression. Consider CXR, UCX if temps rise, CT if SIRS symptoms develop.     Alexis Frock 06/14/2022

## 2022-06-14 NOTE — Progress Notes (Signed)
  Transition of Care [facility]) Screening Note   Patient Details  Name: Dorothy Alvarez Date of Birth: [birth year]   Transition of Care [facility]) CM/SW Contact:    Kimber Relic, LCSW Phone Number: 06/14/2022, 10:47 AM    Transition of Care Department [facility]) has reviewed patient and no TOC needs have been identified at this time. We will continue to monitor patient advancement through interdisciplinary progression rounds. If new patient transition needs arise, please place a TOC consult.

## 2022-06-15 ENCOUNTER — Inpatient Hospital Stay (HOSPITAL_COMMUNITY): Payer: Medicare Other

## 2022-06-15 LAB — CBC WITH DIFFERENTIAL/PLATELET
Abs Immature Granulocytes: 0.03 10*3/uL (ref 0.00–0.07)
Basophils Absolute: 0 10*3/uL (ref 0.0–0.1)
Basophils Relative: 0 %
Eosinophils Absolute: 0.3 10*3/uL (ref 0.0–0.5)
Eosinophils Relative: 3 %
HCT: 35.5 % — ABNORMAL LOW (ref 36.0–46.0)
Hemoglobin: 11.6 g/dL — ABNORMAL LOW (ref 12.0–15.0)
Immature Granulocytes: 0 %
Lymphocytes Relative: 15 %
Lymphs Abs: 1.7 10*3/uL (ref 0.7–4.0)
MCH: 31.2 pg (ref 26.0–34.0)
MCHC: 32.7 g/dL (ref 30.0–36.0)
MCV: 95.4 fL (ref 80.0–100.0)
Monocytes Absolute: 0.8 10*3/uL (ref 0.1–1.0)
Monocytes Relative: 7 %
Neutro Abs: 8.4 10*3/uL — ABNORMAL HIGH (ref 1.7–7.7)
Neutrophils Relative %: 75 %
Platelets: 244 10*3/uL (ref 150–400)
RBC: 3.72 MIL/uL — ABNORMAL LOW (ref 3.87–5.11)
RDW: 12.5 % (ref 11.5–15.5)
WBC: 11.3 10*3/uL — ABNORMAL HIGH (ref 4.0–10.5)
nRBC: 0 % (ref 0.0–0.2)

## 2022-06-15 LAB — URINALYSIS, ROUTINE W REFLEX MICROSCOPIC
Bilirubin Urine: NEGATIVE
Glucose, UA: NEGATIVE mg/dL
Hgb urine dipstick: NEGATIVE
Ketones, ur: NEGATIVE mg/dL
Leukocytes,Ua: NEGATIVE
Nitrite: NEGATIVE
Protein, ur: 30 mg/dL — AB
Specific Gravity, Urine: 1.02 (ref 1.005–1.030)
pH: 6.5 (ref 5.0–8.0)

## 2022-06-15 LAB — BASIC METABOLIC PANEL
Anion gap: 5 (ref 5–15)
BUN: 16 mg/dL (ref 6–20)
CO2: 26 mmol/L (ref 22–32)
Calcium: 8.4 mg/dL — ABNORMAL LOW (ref 8.9–10.3)
Chloride: 111 mmol/L (ref 98–111)
Creatinine, Ser: 1.48 mg/dL — ABNORMAL HIGH (ref 0.44–1.00)
GFR, Estimated: 41 mL/min — ABNORMAL LOW (ref >60)
Glucose, Bld: 99 mg/dL (ref 70–99)
Potassium: 3.1 mmol/L — ABNORMAL LOW (ref 3.5–5.1)
Sodium: 142 mmol/L (ref 135–145)

## 2022-06-15 LAB — URINALYSIS, MICROSCOPIC (REFLEX)

## 2022-06-15 MED ORDER — SODIUM CHLORIDE 0.9 % IV SOLN
1.0000 g | INTRAVENOUS | Status: DC
  Administered 2022-06-15 – 2022-06-16 (×2): 1 g via INTRAVENOUS
  Filled 2022-06-15 (×2): qty 10

## 2022-06-15 NOTE — Progress Notes (Signed)
Attempted to procure urine specimen but specimen was accidentally contaminated by patient. Will try again after she eats breakfast.

## 2022-06-15 NOTE — Progress Notes (Signed)
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 Path pending.  2 - Low Grade Fever - Tm 101.2 POD 2 after uncomplicated nephrectomy. No localizing symptoms. CXR, UCX, CBC pending.   Today "Dorothy Alvarez" is stable but had fever spike again last evening.    Objective: Vital signs in last 24 hours: Temp:  [98.3 F (36.8 C)-101.2 F (38.4 C)] 99.5 F (37.5 C) (10/29 0620) Pulse Rate:  [66-80] 80 (10/29 0620) Resp:  [16-18] 17 (10/29 0620) BP: (120-136)/(68-82) 135/80 (10/29 0620) SpO2:  [93 %-97 %] 94 % (10/29 0620) Last BM Date : 06/12/22  Intake/Output from previous day: 10/28 0701 - 10/29 0700 In: -  Out: 850 [Urine:850] Intake/Output this shift: No intake/output data recorded.  NAD, AOx3, watching TV Non-labored breathign on RA, no cough/congestion RRR SNTND, recent surgical sites c/d/I, no drainage / erythema Foley in place with non-foul urine No c/c/e.   Lab Results:  Recent Labs    06/13/22 1257 06/14/22 0524  HGB 14.2 12.1  HCT 43.7 36.7   BMET Recent Labs    06/14/22 0524  NA 142  K 3.0*  CL 109  CO2 25  GLUCOSE 87  BUN 16  CREATININE 1.61*  CALCIUM 8.4*   PT/INR No results for input(s): "LABPROT", "INR" in the last 72 hours. ABG No results for input(s): "PHART", "HCO3" in the last 72 hours.  Invalid input(s): "PCO2", "PO2"  Studies/Results: No results found.  Anti-infectives: Anti-infectives (From admission, onward)    Start     Dose/Rate Route Frequency Ordered Stop   06/15/22 0845  cefTRIAXone (ROCEPHIN) 1 g in sodium chloride 0.9 % 100 mL IVPB        1 g 200 mL/hr over 30 Minutes Intravenous Every 24 hours 06/15/22 0754     06/13/22 0530  ceFAZolin (ANCEF) IVPB 2g/100 mL premix        2 g 200 mL/hr over 30 Minutes Intravenous  Once 06/13/22 0527 06/13/22 0746       Assessment/Plan:   CXR, UCX, CBC, BMP today. Begin empiric rocephin. Consider CT if  SIRS symptoms develop. Remain in house until afebrile x 24 hours.    Dorothy Alvarez 06/15/2022

## 2022-06-16 LAB — BASIC METABOLIC PANEL
Anion gap: 4 — ABNORMAL LOW (ref 5–15)
BUN: 15 mg/dL (ref 6–20)
CO2: 26 mmol/L (ref 22–32)
Calcium: 8.5 mg/dL — ABNORMAL LOW (ref 8.9–10.3)
Chloride: 112 mmol/L — ABNORMAL HIGH (ref 98–111)
Creatinine, Ser: 1.26 mg/dL — ABNORMAL HIGH (ref 0.44–1.00)
GFR, Estimated: 50 mL/min — ABNORMAL LOW (ref >60)
Glucose, Bld: 85 mg/dL (ref 70–99)
Potassium: 3.9 mmol/L (ref 3.5–5.1)
Sodium: 142 mmol/L (ref 135–145)

## 2022-06-16 LAB — CBC WITH DIFFERENTIAL/PLATELET
Abs Immature Granulocytes: 0.04 10*3/uL (ref 0.00–0.07)
Basophils Absolute: 0 10*3/uL (ref 0.0–0.1)
Basophils Relative: 0 %
Eosinophils Absolute: 0.4 10*3/uL (ref 0.0–0.5)
Eosinophils Relative: 4 %
HCT: 34.2 % — ABNORMAL LOW (ref 36.0–46.0)
Hemoglobin: 11.1 g/dL — ABNORMAL LOW (ref 12.0–15.0)
Immature Granulocytes: 0 %
Lymphocytes Relative: 20 %
Lymphs Abs: 2.1 10*3/uL (ref 0.7–4.0)
MCH: 31 pg (ref 26.0–34.0)
MCHC: 32.5 g/dL (ref 30.0–36.0)
MCV: 95.5 fL (ref 80.0–100.0)
Monocytes Absolute: 0.8 10*3/uL (ref 0.1–1.0)
Monocytes Relative: 8 %
Neutro Abs: 7 10*3/uL (ref 1.7–7.7)
Neutrophils Relative %: 68 %
Platelets: 237 10*3/uL (ref 150–400)
RBC: 3.58 MIL/uL — ABNORMAL LOW (ref 3.87–5.11)
RDW: 12.4 % (ref 11.5–15.5)
WBC: 10.4 10*3/uL (ref 4.0–10.5)
nRBC: 0 % (ref 0.0–0.2)

## 2022-06-16 LAB — CULTURE, OB URINE: Culture: NO GROWTH

## 2022-06-16 MED ORDER — CIPROFLOXACIN HCL 500 MG PO TABS: 500.0000 mg | ORAL_TABLET | Freq: Two times a day (BID) | ORAL | 0 refills | Status: AC

## 2022-06-16 NOTE — Plan of Care (Signed)
  Problem: Health Behavior/Discharge Planning: Goal: Ability to manage health-related needs will improve Outcome: Progressing   Problem: Clinical Measurements: Goal: Ability to maintain clinical measurements within normal limits will improve Outcome: Progressing   Problem: Pain Managment: Goal: General experience of comfort will improve Outcome: Progressing   Problem: Safety: Goal: Ability to remain free from injury will improve Outcome: Progressing   Problem: Skin Integrity: Goal: Risk for impaired skin integrity will decrease Outcome: Progressing   

## 2022-06-16 NOTE — Progress Notes (Signed)
3 Days Post-Op Subjective: Denies pain. No CP or SOB. No nausea or emesis. Tolerating regular diet. Passing flatus and BM. Ambulating. Voiding.  Objective: Vital signs in last 24 hours: Temp:  [98.3 F (36.8 C)-99.3 F (37.4 C)] 98.5 F (36.9 C) (10/30 0557) Pulse Rate:  [72-77] 74 (10/30 0557) Resp:  [16-18] 18 (10/30 0557) BP: (139-150)/(76-92) 147/85 (10/30 0557) SpO2:  [96 %-100 %] 100 % (10/30 0557)  Intake/Output from previous day: 10/29 0701 - 10/30 0700 In: 460 [P.O.:360; IV Piggyback:100] Out: 1050 [Urine:1050] Intake/Output this shift: No intake/output data recorded. UOP:1L  Physical Exam:  General: Alert and oriented CV: RRR Lungs: Clear Abdomen: Soft, ND, ATTP; inc c/d/I Ext: NT, No erythema  Lab Results: Recent Labs    06/14/22 0524 06/15/22 0930 06/16/22 0448  HGB 12.1 11.6* 11.1*  HCT 36.7 35.5* 34.2*   BMET Recent Labs    06/15/22 0930 06/16/22 0448  NA 142 142  K 3.1* 3.9  CL 111 112*  CO2 26 26  GLUCOSE 99 85  BUN 16 15  CREATININE 1.48* 1.26*  CALCIUM 8.4* 8.5*     Studies/Results: DG Chest 2 View  Result Date: 06/15/2022 CLINICAL DATA:  Fever.  Two days postop. EXAM: CHEST - 2 VIEW COMPARISON:  February 15, 2022 FINDINGS: Air under the right and left sides of the hemidiaphragm are likely due to the recent postop status. The heart, hila, mediastinum are normal. No pneumothorax. Minimal atelectasis in the bases. No other acute abnormalities are noted. IMPRESSION: 1. Air under the right and left sides of the hemidiaphragm is likely due to the recent postop status. 2. Minimal atelectasis in the bases. Electronically Signed   By: Dorise Bullion III M.D.   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 Path pending.   2 - Low Grade Fever - Tm 100.4 POD 1 after uncomplicated nephrectomy. No localizing symptoms.Resolved. Afebrile for >24 hours.  -Pain control  prn -Diet as tolerated -Afebrile for >24 hours. Will discharge home on course of PO abx. -OOB, amb, IS   LOS: 3 days   Matt R. Danitra Payano MD 06/16/2022, 7:27 AM [facility]  Pager: [phone]

## 2022-06-16 NOTE — Discharge Summary (Addendum)
Date of admission: 06/13/2022  Date of discharge: 06/16/2022  Admission diagnosis: Left renal mass  Discharge diagnosis: Left renal mass  Secondary diagnoses: CHF, clinically undetermined  History and Physical: For full details, please see admission history and physical. Briefly, Dorothy Alvarez is a 56 y.o. year old patient with a left renal mass who underwent left robotic assisted laparoscopic radical nephrectomy on 06/13/2022.   Hospital Course: The patient recovered in the usual expected fashion.  She had her diet advanced slowly.  Initially managed with IV pain control, then transitioned to PO meds when she was tolerating oral intake.  Her labs were stable throughout the hospital course. She did have a low grade fever that resolved. CXR was unremarkable. She had no abdominal pain. She was discharged to home on POD#3.  At the time of discharge she was tolerating a regular diet, passing flatus, ambulating, had adequate pain control and was agreeable to discharge.  Follow up as scheduled.    Laboratory values:  Recent Labs    06/14/22 0524 06/15/22 0930 06/16/22 0448  HGB 12.1 11.6* 11.1*  HCT 36.7 35.5* 34.2*   Recent Labs    06/15/22 0930 06/16/22 0448  CREATININE 1.48* 1.26*    Disposition: Home  Discharge instruction: The patient was instructed to be ambulatory but told to refrain from heavy lifting, strenuous activity, or driving.   Discharge medications:  Allergies as of 06/16/2022       Reactions   Ketorolac Other (See Comments)   Anxiety and confusion   Morphine And Related Other (See Comments)   Mental status changes (confusion and anxiety)   Nsaids Nausea Only   Penicillins Itching   Has patient had a PCN reaction causing immediate rash, facial/tongue/throat swelling, SOB or lightheadedness with hypotension: Yes Has patient had a PCN reaction causing severe rash involving mucus membranes or skin necrosis: No Has patient had a PCN reaction that required  hospitalization No Has patient had a PCN reaction occurring within the last 10 years: No If all of the above answers are "NO", then may proceed with Cephalosporin use.   Trazodone And Nefazodone Other (See Comments)   Restlessness and opposite/paradoxical reaction/effect        Medication List     STOP taking these medications    aspirin EC 81 MG tablet       TAKE these medications    acetaminophen 325 MG tablet Commonly known as: TYLENOL Take 650 mg by mouth every 6 (six) hours as needed for moderate pain.   atorvastatin 80 MG tablet Commonly known as: LIPITOR Take 1 tablet (80 mg total) by mouth daily.   ciprofloxacin 500 MG tablet Commonly known as: Cipro Take 1 tablet (500 mg total) by mouth 2 (two) times daily for 5 days.   docusate sodium 100 MG capsule Commonly known as: COLACE Take 1 capsule (100 mg total) by mouth 2 (two) times daily.   Entresto 49-51 MG Generic drug: sacubitril-valsartan TAKE 1 TABLET BY MOUTH TWICE A DAY   furosemide 40 MG tablet Commonly known as: LASIX Take 1 tablet (40 mg total) by mouth daily for 120 doses.   isosorbide dinitrate 10 MG tablet Commonly known as: ISORDIL Take 1 tablet (10 mg total) by mouth 3 (three) times daily.   MELATONIN PO Take 1 tablet by mouth at bedtime as needed (sleep).   metoprolol succinate 100 MG 24 hr tablet Commonly known as: TOPROL-XL Take 1.5 tablets (150 mg total) by mouth daily. Take with or immediately following a  meal. What changed: how much to take   potassium chloride 20 MEQ packet Commonly known as: KLOR-CON Place 20 mEq into feeding tube 2 (two) times daily.   risperiDONE 0.5 MG disintegrating tablet Commonly known as: RISPERDAL M-TABS Take 1 tablet (0.5 mg total) by mouth at bedtime.   risperiDONE 0.5 MG tablet Commonly known as: RISPERDAL Take 0.5 mg by mouth at bedtime.   sertraline 50 MG tablet Commonly known as: ZOLOFT Take 1 tablet (50 mg total) by mouth daily.    traMADol 50 MG tablet Commonly known as: Ultram Take 1-2 tablets (50-100 mg total) by mouth every 6 (six) hours as needed for moderate pain or severe pain.        Followup:   Follow-up Information     Janith Lima, MD Follow up on 06/23/2022.   Specialty: Urology Why: at 11:15 Contact information: [place] [id number] [address] [facility]  Pager: [phone]

## 2022-06-16 NOTE — Progress Notes (Signed)
  Transition of Care [facility]) Screening Note   Patient Details  Name: Dorothy Alvarez Date of Birth: September 18, 1965   Transition of Care [facility]) CM/SW Contact:    Dessa Phi, RN Phone Number: 06/16/2022, 10:17 AM    Transition of Care Department [facility]) has reviewed patient and no TOC needs have been identified at this time. We will continue to monitor patient advancement through interdisciplinary progression rounds. If new patient transition needs arise, please place a TOC consult.

## 2022-06-16 NOTE — Care Management Important Message (Signed)
Important Message  Patient Details IM Letter given to the Patient. Name: Niveah Boerner MRN: [id number] Date of Birth: 1966-06-08   Medicare Important Message Given:  Yes     Kerin Salen 06/16/2022, 11:26 AM

## 2022-06-17 LAB — SURGICAL PATHOLOGY

## 2022-06-23 ENCOUNTER — Other Ambulatory Visit (HOSPITAL_COMMUNITY): Payer: Medicare HMO

## 2022-07-13 ENCOUNTER — Other Ambulatory Visit: Payer: Self-pay | Admitting: Cardiovascular Disease

## 2022-07-16 NOTE — Progress Notes (Signed)
Cardiology Office Note:    Date:  07/21/2022   ID:  Dorothy Alvarez, DOB April 16, 1966, MRN [id number]  PCP:  Nolene Ebbs, MD  Cardiologist:  Sanda Klein, MD  Electrophysiologist:  None   Referring MD: Nolene Ebbs, MD   Chief Complaint: follow-up of CHF  History of Present Illness:    Dorothy Alvarez is a 56 y.o. female with a history of normal coronary arteries on cardiac catheterization in 04/2020, non-ischemic cardiomyopathy/ chronic systolic CHF with EF of 42% on most recent Echo in 07/2021, multiple strokes, hypertension, hyperlipidemia, left renal mass noted on CT in 09/2021, possible bipolar disorder, depression/anxiety, and vascular dementia who is a patient of Dr. Sallyanne Kuster and presents today for follow-up of CHF.    Patient was admitted in 04/2020 with new onset CHF after presenting with dyspnea on exertion, orthopnea, and lower extremity edema. Echo showed LVEF of 25-30% with global hypokinesis, mild LVH, grade III diastolic dysfunction as well as mild MR and mildly elevated PASP. She underwent R/LHC which showed widely patent coronary arteries and moderate pulmonary hypertension (WHO Group II). She was diuresed and started on GDMT. Outpatient sleep study was recommended. She was lost to follow-up after this hospitalization and has a 100% no show rate in our office.   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  Brain MRI showed diffusion defect concerning for acute left PCA infarct. Further work-up by Neurology revealed bilateral multifocal stroke with infarcts in the left PCA, right cerebellum, bilateral frontal and right thalamus. Echo showed LVEF of at best 20% with global hypokinesis and mild LVH with LV trabeculation but no thrombus. RV normal in size with moderately reduced RV systolic function and severely elevated PASP of 65.7 mmHg. She required PEG tube due to dysphagia from stroke. She was was  started on Eliquis and a high-intensity statin. Initial plan was for TEE during admission to rule out endocarditis but patient was not felt stable enough for this given she was unresponsive and requiring restraints. There was concern that she would not be able to protect her airway and would require intubation. Cardiology was not formally consulted during this admission but she did require IV diuresis. She was never seen by Cardiology after this admission either.   She was admitted to the [facility] in 11/2021 after presenting with suicidal ideation and worsening depressive symptoms. [name], she fell out of a chair from the seated position. There was concern that she had some LOC for about 5 seconds and some seizure like activity so she was sent to the ED. Head CT showed no acute findings and was felt to be stable for discharge. Patient was seen by me for follow-up in 12/2021 at which time she was doing well and denied any cardiac symptoms. She was weaned off Clonidine in an effort to further optimize GDMT and repeat Echo was ordered to reassess LV function. Echo showed LVEF of 50-55% with no regional wall motion abnormalities, severe LVH, and grade 1 diastolic dysfunction as well as normal RV with mildly reduced RV systolic function, severely dilated left atrium, and small pericardial effusion. Cardiac MR was ordered for further evaluation of etiology of cardiomyopathy. Before this could be done, she was seen in the ED in 02/2022 for chest discomfort that she described as a throbbing sensation and lightheadedness. EKG showed frequent PVCs. High-sensitivity troponin was negative x2. Chest pain was felt  to be atypical and PVCs were felt to be the possible source. She was advised to follow-up with Cardiology. She denied any chest pain at follow-up visit. Event Monitor had already been ordered for work-up on stroke but she had not worn this yet. She was advised to  wear this and it showed frequent PVCs with a 22% burden commonly in bigeminy as well as one 9 beat run of NSVT but no atrial fibrillation. Therefore, Eliquis was stopped and he was restarted on Aspirin per Neurology's recommendation. 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 She was last seen by Dr. Sallyanne Kuster in 04/2022 at which time she was doing well from a cardiac standpoint. FDG-PET was ordered for further evaluation of cardiac sarcoidosis.   Since last visit, she underwent a robotic assisted laparoscopic left radical nephrectomy with left adrenalectomy on 06/13/2022 for a left renal mass concerning for cancer. Procedure went well with no apparent complications and she was able to be discharged on 06/16/2022.  Patient presents today for follow-up. Here with husband. Patient is doing well from a cardiac standpoint. She denies any chest pain, shortness of breath, orthopnea, PND, edema, palpitations, lightheadedness, dizziness, or syncope. Weight is down about 14 lbs since last visit in 04/2022. She has not had the FDG-PET scan done yet. She states [facility] called her to schedule this around the time of her kidney procedure in 05/2022 so they had to post-pone this. Husband states [facility] told them they would call back later to reschedule but have not heard anything yet.   Of note, patient does have some residual memory deficits since her stroked. She asked repetitive questions throughout visit.  Past Medical History:  Diagnosis Date   Allergy    Anxiety    Arthritis    Bipolar 1 disorder (HCC)    Chronic abdominal pain    Chronic combined systolic and diastolic CHF (congestive heart  failure) (HCC)    Constipation    Depression    Heart failure (HCC)    Hyperlipidemia    Hypertension    IBS (irritable bowel syndrome)    Learning disability    MDD (major depressive disorder)    Memory loss    since her CVA   Metabolic encephalopathy    Neuromuscular disorder (HCC)    Nonischemic cardiomyopathy (HCC)    Rhabdomyolysis    Sepsis ([place])    Stroke [facility])     Past Surgical History:  Procedure Laterality Date   ABDOMINAL HYSTERECTOMY     GALLBLADDER SURGERY     IR GASTROSTOMY TUBE MOD SED  08/07/2021   KNEE SURGERY     RIGHT/LEFT HEART CATH AND CORONARY ANGIOGRAPHY N/A 05/04/2020   Procedure: RIGHT/LEFT HEART CATH AND CORONARY ANGIOGRAPHY;  Surgeon: Belva Crome, MD;  Location: [facility];  Service: Cardiovascular;  Laterality: N/A;   ROBOTIC ASSITED PARTIAL NEPHRECTOMY Left 06/13/2022   Procedure: XI ROBOTIC ASSITED RADICAL NEPHRECTOMY;  Surgeon: Janith Lima, MD;  Location: [facility];  Service: Urology;  Laterality: Left;  ONLY NEEDS 240 MIN    Current Medications: Current Meds  Medication Sig   acetaminophen (TYLENOL) 325 MG tablet Take 650 mg by mouth every 6 (six) hours as needed for moderate pain.  atorvastatin (LIPITOR) 80 MG tablet Take 1 tablet (80 mg total) by mouth daily.   docusate sodium (COLACE) 100 MG capsule Take 1 capsule (100 mg total) by mouth 2 (two) times daily.   ezetimibe (ZETIA) 10 MG tablet Take 1 tablet (10 mg total) by mouth daily.   furosemide (LASIX) 40 MG tablet TAKE 1 TABLET (40 MG TOTAL) BY MOUTH DAILY   isosorbide dinitrate (ISORDIL) 10 MG tablet Take 1 tablet (10 mg total) by mouth 3 (three) times daily.   MELATONIN PO Take 1 tablet by mouth at bedtime as needed (sleep).   metoprolol succinate (TOPROL-XL) 100 MG 24 hr tablet Take 1.5 tablets (150 mg total) by mouth daily. Take with or immediately following a meal. (Patient taking differently: Take 100 mg by mouth daily. Take with or immediately following a meal.)    potassium chloride (KLOR-CON) 20 MEQ packet Place 20 mEq into feeding tube 2 (two) times daily.   risperiDONE (RISPERDAL) 0.5 MG tablet Take 0.5 mg by mouth at bedtime.   sacubitril-valsartan (ENTRESTO) 49-51 MG TAKE 1 TABLET BY MOUTH TWICE A DAY   traMADol (ULTRAM) 50 MG tablet Take 1-2 tablets (50-100 mg total) by mouth every 6 (six) hours as needed for moderate pain or severe pain.     Allergies:   Ketorolac, Morphine and related, Nsaids, Penicillins, and Trazodone and nefazodone   Social History   Socioeconomic History   Marital status: Legally Separated    Spouse name: Not on file   Number of children: 2   Years of education: Not on file   Highest education level: Not on file  Occupational History   Occupation: DISABLED    Employer: UNEMPLOYED  Tobacco Use   Smoking status: Never   Smokeless tobacco: Never  Vaping Use   Vaping Use: Never used  Substance and Sexual Activity   Alcohol use: No   Drug use: Yes    Types: Other-see comments, Marijuana    Comment: opiates   Sexual activity: Not Currently    Birth control/protection: Surgical  Other Topics Concern   Not on file  Social History Narrative   Not on file   Social Determinants of Health   Financial Resource Strain: Not on file  Food Insecurity: No Food Insecurity (06/13/2022)   Hunger Vital Sign    Worried About Running Out of Food in the Last Year: Never true    Ran Out of Food in the Last Year: Never true  Transportation Needs: No Transportation Needs (06/13/2022)   PRAPARE - Hydrologist (Medical): No    Lack of Transportation (Non-Medical): No  Physical Activity: Not on file  Stress: Not on file  Social Connections: Not on file     Family History: The patient's family history includes Depression in her father; Diabetes in her mother; Heart disease in her father; Stroke in her father; Suicidality in her mother; Transient ischemic attack in her mother.  ROS:   Please see  the history of present illness.     EKGs/Labs/Other Studies Reviewed:    The following studies were reviewed:  Right/Left Cardiac Catheterization 05/04/2020: Widely patent coronary arteries. Acute on chronic combined systolic and diastolic heart failure with mean wedge 18 mmHg and LVEDP 20 mmHg Moderate pulmonary hypertension, mean PAP 35 mmHg; . WHO Group II (Hemodynamic classification: Pulmonary capillary wedge pressure>15; pulmonary vascular resistance > 3 WU)   Recommendations: Guideline directed therapy for systolic heart failure. Consider obstructive sleep apnea as a contributor to  pulmonary hypertension given the elevated vascular resistance.   Diagnostic Dominance: Right  _______________   Echocardiogram 07/31/2021: Impressions:  1. Left ventricular ejection fraction, by estimation, is at best 20%. The  left ventricle has severely decreased function. The left ventricle  demonstrates global hypokinesis. There is mild left ventricular  hypertrophy. Left ventricular diastolic  parameters are indeterminate. There is LV trabeculation without thrombus.   2. Right ventricular systolic function is moderately reduced. The right  ventricular size is normal. There is severely elevated pulmonary artery  systolic pressure. The estimated right ventricular systolic pressure is  16.1 mmHg.   3. Left atrial size was mildly dilated.   4. The mitral valve is normal in structure. Mild mitral valve  regurgitation. No evidence of mitral stenosis.   5. Tricuspid valve regurgitation is moderate to severe.   6. The aortic valve is tricuspid. There is mild thickening of the aortic  valve. Aortic valve regurgitation is not visualized. Aortic valve  sclerosis is present, with no evidence of aortic valve stenosis.   7. The inferior vena cava is dilated in size with <50% respiratory  variability, suggesting right atrial pressure of 15 mmHg.   Comparison(s): A prior study was performed on 05/02/2020.  Further decrease in LV function.  _______________   Carotid Ultrasounds 08/01/2021: Summary:  - Right Carotid: Velocities in the right ICA are consistent with a 1-39%  stenosis.  - Left Carotid: Velocities in the left ICA are consistent with a 1-39%  stenosis.  - Vertebrals:  Bilateral vertebral arteries demonstrate antegrade flow.  - Subclavians: Normal flow hemodynamics were seen in bilateral subclavian arteries.  _______________   Echocardiogram 01/17/2022: Impressions:  1. Left ventricular ejection fraction, by estimation, is 50 to 55%. The  left ventricle has low normal function. The left ventricle has no regional  wall motion abnormalities. There is severe concentric left ventricular  hypertrophy. Left ventricular  diastolic parameters are consistent with Grade I diastolic dysfunction  (impaired relaxation).   2. Right ventricular systolic function is mildly reduced. The right  ventricular size is normal. There is normal pulmonary artery systolic  pressure.   3. Left atrial size was severely dilated.   4. A small pericardial effusion is present. The pericardial effusion is  anterior to the right ventricle.   5. The mitral valve is normal in structure. Trivial mitral valve  regurgitation. No evidence of mitral stenosis.   6. The aortic valve is tricuspid. Aortic valve regurgitation is not  visualized. No aortic stenosis is present.   7. Aortic dilatation noted. There is borderline dilatation of the  ascending aorta, measuring 36 mm.   8. The inferior vena cava is normal in size with greater than 50%  respiratory variability, suggesting right atrial pressure of 3 mmHg.   Comparison(s): The left ventricular function has improved. EF 20%, mild  LVH, mod-sev TR, RVSP 65.61mHg.  _______________  Event Monitor 02/2022:   The dominant rhythm is normal sinus, with normal circadian variation.   Very frequent premature ventricular contractions are seen, representing 22% of all the  captured beats.  Most of the PVCs have the same morphology.  They commonly occur in a pattern of bigeminy which sometimes was present for prolonged periods of time.   There is a single episode of 9 beat, 6-second nonsustained ventricular tachycardia.  The QRS complex morphology during nonsustained ventricular tachycardia is different from the dominant PVC morphology.   There are rare premature atrial contractions, there is no evidence of complex atrial arrhythmia,  including no evidence of atrial fibrillation.   There is no evidence of significant bradycardia.   No symptom-related recordings were reported.   Abnormal 7-day event monitor due to the presence of very frequent PVCs (22% of all beats), commonly occurring in a pattern of bigeminy.  A single 9 beat episode of nonsustained ventricular tachycardia was seen. _______________  Cardiac MRI 04/16/2022: Impressions: 1. Normal LV size with mild LV hypertrophy. Regional wall motion abnormalities as noted above. 2.  Normal RV size and systolic function, EF 60%. 3. Elevated ECV percentage is likely reflective of the areas of myocardial LGE. 4. LGE pattern as noted above. This looks like a coronary pattern (myocardial infarction) involving RCA and LCx territories. 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   EKG:  EKG not ordered today.   Recent Labs: 08/11/2021: B Natriuretic Peptide 631.3 12/03/2021: TSH 2.284 02/15/2022: Magnesium 1.9 03/04/2022: ALT 16 06/16/2022: BUN 15; Creatinine, Ser 1.26; Hemoglobin 11.1; Platelets 237; Potassium 3.9; Sodium 142  Recent Lipid Panel    Component Value Date/Time   CHOL 129 03/04/2022 0858   TRIG 80 03/04/2022 0858   HDL 36 (L) 03/04/2022 0858   CHOLHDL 3.6 03/04/2022 0858   CHOLHDL 4.6 07/31/2021 0344   VLDL 24 07/31/2021 0344   LDLCALC 77 03/04/2022 0858    Physical Exam:    Vital Signs: BP 126/64   Pulse 64   Ht '5\' 6"'$  (1.676 m)   Wt 171 lb 6.4 oz (77.7 kg)   SpO2 98%    BMI 27.66 kg/m     Wt Readings from Last 3 Encounters:  07/21/22 171 lb 6.4 oz (77.7 kg)  06/13/22 185 lb (83.9 kg)  06/10/22 185 lb (83.9 kg)     General: 56 y.o. African-American female in no acute distress. HEENT: Normocephalic and atraumatic. Sclera clear. Neck: Supple. No carotid bruits. No JVD. Heart: RRR. Distinct S1 and S2. No murmurs, gallops, or rubs. Radial and pulses 2+ and equal bilaterally. Lungs: No increased work of breathing. Clear to ausculation bilaterally. No wheezes, rhonchi, or rales.  Abdomen: Soft, non-distended, and non-tender to palpation.  Extremities: No lower extremity edema.    Skin: Warm and dry. Neuro: No focal deficits. Psych: Normal affect.   Assessment:    1. Non-ischemic cardiomyopathy ([place])   2. Chronic combined systolic and diastolic CHF (congestive heart failure) ([place])   3. Frequent PVCs   4. Primary hypertension   5. Hyperlipidemia, unspecified hyperlipidemia type   6. Cerebrovascular accident (CVA), unspecified mechanism ([place])   7. S/p left nephrectomy and adrenalecotmy     Plan:    Non-Ischemic Cardiomyopathy Chronic Combined CHF Initially diagnosed in 04/2020 when found to have LVEF of 25-30%. LHC at that time showed normal coronaries. Last Echo in 07/2021 showed LVEF of at best 20% with global hypokinesis and mild LVH with LV trabeculation but no thrombus. RV normal in size with moderately reduced RV systolic function and severely elevated PASP of 65.7 mmHg. Repeat Echo on 01/17/2022 LVEF of 50-55% with no regional wall motion abnormalities, severe LVH, and grade 1 diastolic dysfunction as well as normal RV with mildly reduced RV systolic function, severely dilated left atrium, normal PASP, and small pericardial effusion. 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 FDG-PET was ordered for further evaluation of cardiac sarcoidosis.  - Euvolemic on exam.  - Continue Lasix '40mg'$  daily. - Continue Entresto 49-'51mg'$  twice daily. - Continue Toprol-XL '150mg'$  daily. - Continue Isordil '10mg'$  three times daily. - Cardiac MRI was strongly suggestive of cardiac sarcoidosis. However, can not rule out high PVC burden as source of cardiomyopathy. FDG-PET pending as above. We do not do this study here in [place] so patient will have this done at [facility]. She has not had this done yet. [facility] called her to schedule this at the end of October but this was around the time of her kidney procedure so she was unable to do this. Husband states [facility] said they would call back at a later time to schedule this. Asked patient and husband to let us know if they have not heard back from them by the end of the year.    Frequent PVC Monitor in 02/2022 showed frequent PVCs with a 22% burden commonly in bigeminy as well as one 9 beat run of NSVT but no atrial fibrillation. - Asymptomatic with this. No obvious ectopy on exam. - Continue Toprol-XL '150mg'$  daily. - Can consider antiarrhythmic therapy after we review the findings on FDG-PET.  Hypertension BP well controlled. - Continue medications for CHF as above.   Hyperlipidemia Lipid panel as high as 217 in 2020. Most recent lipid panel in 02/2022 at time of stroke: Total Cholesterol 123, Triglycerides 80, HDL 36, LDL 77.  LDL goal <70 given history of stroke. - Continue Lipitor '80mg'$  daily. - Will start Zetia '10mg'$  daily.  - Will repeat lipid panel and LFTs in 2-3 months.   Stroke Patient was admitted in 07/2021 with acute stroke and was found to have bilateral multifocal stroke with infarcts in the left PCA, right cerebellum, bilateral frontal and right thalamus. TTE showed LVEF of at best 20%  with LV trabeculation but no thrombus. TEE was initially planned to rule out endocarditis but was not performed due to concern that patient would not be able to protect her airway and would require intubation. Recent repeat TTE showed normalization of EF. Monitor showed no atrial fibrillation. - She has some memory issues since stroke but no other residual deficits. - Continue aspirin and high-intensity statin.  - Followed by Neurology.  S/p Left Nephrectomy and Adrenalectomy  Patient underwent left radical nephrectomy and adrenalectomy on 06/13/2022 for renal mass concerning for malignancy. 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 Tumor was confined within the renal capsul and margins were negative. - Doing well since surgery. - Will repeat BMET today to ensure renal function s/p on Lasix and Enresto.  Disposition: Follow up in 4 months.   Medication Adjustments/Labs and Tests Ordered: Current medicines are reviewed at length with the patient today.  Concerns regarding medicines are outlined above.  Orders Placed This Encounter  Procedures   Lipid panel   Hepatic function panel   Basic metabolic panel   Meds ordered this encounter  Medications   ezetimibe (ZETIA) 10 MG tablet    Sig: Take 1 tablet (10 mg total) by mouth daily.    Dispense:  90 tablet    Refill:  3    Patient Instructions  Medication Instructions:  Start Zetia 10 mg ( Take 1 Tablet Daily). *If you need a refill on your cardiac medications before your next appointment, please call your pharmacy*   Lab Work: BMET Today  Lipid Panel, Hepatic Panel 2-3 Months If you have  labs (blood work) drawn today and your tests are completely normal, you will receive your results only by: [place] (if you have MyChart) OR A paper copy in the mail If you have any lab test that is abnormal or we need to change your treatment, we will call you to review the results.   Testing/Procedures: No  Testing   Follow-Up: At [facility], you and your health needs are our priority.  As part of our continuing mission to provide you with exceptional heart care, we have created designated Provider Care Teams.  These Care Teams include your primary Cardiologist (physician) and Advanced Practice Providers (APPs -  Physician Assistants and Nurse Practitioners) who all work together to provide you with the care you need, when you need it.  We recommend signing up for the patient portal called "MyChart".  Sign up information is provided on this After Visit Summary.  MyChart is used to connect with patients for Virtual Visits (Telemedicine).  Patients are able to view lab/test results, encounter notes, upcoming appointments, etc.  Non-urgent messages can be sent to your provider as well.   To learn more about what you can do with MyChart, go to [email or web address].    Your next appointment:   4 month(s)  The format for your next appointment:   In Person  Provider:   Sanda Klein, MD        Signed, Darreld Mclean, PA-C  07/21/2022 3:34 PM    [place]

## 2022-07-21 ENCOUNTER — Encounter: Payer: Self-pay | Admitting: Student

## 2022-07-21 ENCOUNTER — Ambulatory Visit: Payer: Medicare Other | Attending: Student | Admitting: Student

## 2022-07-21 ENCOUNTER — Other Ambulatory Visit: Payer: Self-pay | Admitting: Student

## 2022-07-21 VITALS — BP 126/64 | HR 64 | Ht 66.0 in | Wt 171.4 lb

## 2022-07-21 DIAGNOSIS — I493 Ventricular premature depolarization: Secondary | ICD-10-CM | POA: Diagnosis not present

## 2022-07-21 DIAGNOSIS — I428 Other cardiomyopathies: Secondary | ICD-10-CM

## 2022-07-21 DIAGNOSIS — E785 Hyperlipidemia, unspecified: Secondary | ICD-10-CM

## 2022-07-21 DIAGNOSIS — I10 Essential (primary) hypertension: Secondary | ICD-10-CM

## 2022-07-21 DIAGNOSIS — I639 Cerebral infarction, unspecified: Secondary | ICD-10-CM

## 2022-07-21 DIAGNOSIS — I5042 Chronic combined systolic (congestive) and diastolic (congestive) heart failure: Secondary | ICD-10-CM

## 2022-07-21 DIAGNOSIS — Z905 Acquired absence of kidney: Secondary | ICD-10-CM

## 2022-07-21 MED ORDER — EZETIMIBE 10 MG PO TABS: 10.0000 mg | ORAL_TABLET | Freq: Every day | ORAL | 3 refills | Status: DC

## 2022-07-21 NOTE — Patient Instructions (Signed)
Medication Instructions:  Start Zetia 10 mg ( Take 1 Tablet Daily). *If you need a refill on your cardiac medications before your next appointment, please call your pharmacy*   Lab Work: BMET Today  Lipid Panel, Hepatic Panel 2-3 Months If you have labs (blood work) drawn today and your tests are completely normal, you will receive your results only by: [place] (if you have MyChart) OR A paper copy in the mail If you have any lab test that is abnormal or we need to change your treatment, we will call you to review the results.   Testing/Procedures: No Testing   Follow-Up: At [facility], you and your health needs are our priority.  As part of our continuing mission to provide you with exceptional heart care, we have created designated Provider Care Teams.  These Care Teams include your primary Cardiologist (physician) and Advanced Practice Providers (APPs -  Physician Assistants and Nurse Practitioners) who all work together to provide you with the care you need, when you need it.  We recommend signing up for the patient portal called "MyChart".  Sign up information is provided on this After Visit Summary.  MyChart is used to connect with patients for Virtual Visits (Telemedicine).  Patients are able to view lab/test results, encounter notes, upcoming appointments, etc.  Non-urgent messages can be sent to your provider as well.   To learn more about what you can do with MyChart, go to [email or web address].    Your next appointment:   4 month(s)  The format for your next appointment:   In Person  Provider:   Sanda Klein, MD

## 2022-07-23 LAB — BASIC METABOLIC PANEL
BUN/Creatinine Ratio: 16 (ref 9–23)
BUN: 25 mg/dL — ABNORMAL HIGH (ref 6–24)
CO2: 24 mmol/L (ref 20–29)
Calcium: 9.9 mg/dL (ref 8.7–10.2)
Chloride: 100 mmol/L (ref 96–106)
Creatinine, Ser: 1.53 mg/dL — ABNORMAL HIGH (ref 0.57–1.00)
Glucose: 85 mg/dL (ref 70–99)
Potassium: 4.6 mmol/L (ref 3.5–5.2)
Sodium: 141 mmol/L (ref 134–144)
eGFR: 39 mL/min/{1.73_m2} — ABNORMAL LOW (ref >59)

## 2022-10-18 ENCOUNTER — Emergency Department (HOSPITAL_COMMUNITY): Payer: 59

## 2022-10-18 ENCOUNTER — Emergency Department (HOSPITAL_COMMUNITY)
Admission: EM | Admit: 2022-10-18 | Discharge: 2022-10-18 | Disposition: A | Discharge status: Home / Self Care | Payer: 59 | Attending: Emergency Medicine | Admitting: Emergency Medicine

## 2022-10-18 ENCOUNTER — Other Ambulatory Visit: Payer: Self-pay

## 2022-10-18 ENCOUNTER — Encounter (HOSPITAL_COMMUNITY): Payer: Self-pay | Admitting: *Deleted

## 2022-10-18 DIAGNOSIS — M25512 Pain in left shoulder: Secondary | ICD-10-CM | POA: Diagnosis not present

## 2022-10-18 DIAGNOSIS — M549 Dorsalgia, unspecified: Secondary | ICD-10-CM | POA: Insufficient documentation

## 2022-10-18 DIAGNOSIS — M542 Cervicalgia: Secondary | ICD-10-CM | POA: Diagnosis not present

## 2022-10-18 DIAGNOSIS — Z8673 Personal history of transient ischemic attack (TIA), and cerebral infarction without residual deficits: Secondary | ICD-10-CM | POA: Diagnosis not present

## 2022-10-18 DIAGNOSIS — Z79899 Other long term (current) drug therapy: Secondary | ICD-10-CM | POA: Diagnosis not present

## 2022-10-18 DIAGNOSIS — M79605 Pain in left leg: Secondary | ICD-10-CM | POA: Insufficient documentation

## 2022-10-18 DIAGNOSIS — Y92410 Unspecified street and highway as the place of occurrence of the external cause: Secondary | ICD-10-CM | POA: Diagnosis not present

## 2022-10-18 DIAGNOSIS — M79602 Pain in left arm: Secondary | ICD-10-CM | POA: Insufficient documentation

## 2022-10-18 DIAGNOSIS — R519 Headache, unspecified: Secondary | ICD-10-CM | POA: Diagnosis not present

## 2022-10-18 MED ORDER — ACETAMINOPHEN 500 MG PO TABS
1000.0000 mg | ORAL_TABLET | Freq: Once | ORAL | Status: AC
  Administered 2022-10-18: 1000 mg via ORAL
  Filled 2022-10-18: qty 2
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
## 2022-11-24 ENCOUNTER — Ambulatory Visit: Payer: 59 | Attending: Cardiovascular Disease | Admitting: Cardiovascular Disease

## 2022-12-13 ENCOUNTER — Other Ambulatory Visit: Payer: Self-pay | Admitting: Cardiovascular Disease

## 2023-03-17 ENCOUNTER — Other Ambulatory Visit: Payer: Self-pay | Admitting: Internal Medicine

## 2023-04-16 ENCOUNTER — Other Ambulatory Visit: Payer: Self-pay | Admitting: Cardiovascular Disease

## 2023-05-17 ENCOUNTER — Other Ambulatory Visit: Payer: Self-pay | Admitting: Student

## 2023-05-27 ENCOUNTER — Telehealth: Payer: Self-pay | Admitting: Physician Assistant

## 2023-05-27 ENCOUNTER — Telehealth: Payer: Self-pay | Admitting: Student

## 2023-05-27 NOTE — Telephone Encounter (Signed)
Called to patient. Message states "This mailbox is currently not accepting messages". Samples are not available

## 2023-05-27 NOTE — Telephone Encounter (Signed)
Patient calling the office for samples of medication:   1.  What medication and dosage are you requesting samples for? Ezetimibe  2.  Are you currently out of this medication? Yes, the pharmacist said she can not get any more medicine until 06-29-23, because of her insurance

## 2023-05-27 NOTE — Telephone Encounter (Signed)
Wrong provider

## 2023-05-27 NOTE — Telephone Encounter (Signed)
Called pt and pt's pharmacy and the pharmacy stated that pt picked a 90 day supply on 04/15/23 and will not be able to get anymore medication until the middle of November 2024. I inform pt of this and explained that pt could get more medication if she paid out of pocket for it. I advised the pt that if she has any other problems, questions or concerns, to give our office a call back. Pt verbalized understanding.

## 2023-05-27 NOTE — Telephone Encounter (Signed)
Patient calling the office for samples of medication:     1.  What medication and dosage are you requesting samples for? Ezetimibe   2.  Are you currently out of this medication? Yes, the pharmacist said she can not get any more medicine until 06-29-23, because of her insurance         [facility] wants to know if she is unable to get enough medicine until 06-29-23, will it be alright, if she does not take it?

## 2023-05-28 NOTE — Telephone Encounter (Signed)
Advised patient there are no samples.  It is "too soon" to pick up as she received a 90 day supply in August.  She is advised to be on the medication for 3 months consistently before having labs done. She states understanding.

## 2023-06-15 ENCOUNTER — Other Ambulatory Visit: Payer: Self-pay | Admitting: Cardiovascular Disease

## 2023-07-07 ENCOUNTER — Other Ambulatory Visit: Payer: Self-pay | Admitting: Cardiovascular Disease

## 2023-07-18 ENCOUNTER — Other Ambulatory Visit: Payer: Self-pay | Admitting: Cardiovascular Disease

## 2023-07-21 ENCOUNTER — Telehealth: Payer: Self-pay | Admitting: Cardiovascular Disease

## 2023-07-21 ENCOUNTER — Other Ambulatory Visit: Payer: Self-pay

## 2023-07-21 MED ORDER — ENTRESTO 49-51 MG PO TABS: 1.0000 | ORAL_TABLET | Freq: Two times a day (BID) | ORAL | 0 refills | Status: DC

## 2023-07-21 NOTE — Telephone Encounter (Signed)
*  STAT* If patient is at the pharmacy, call can be transferred to refill team.   1. Which medications need to be refilled? (please list name of each medication and dose if known)   sacubitril-valsartan (ENTRESTO) 49-51 MG   2. Would you like to learn more about the convenience, safety, & potential cost savings by using the [facility]?   3. Are you open to using the [facility] (Type [facility]. ).  4. Which pharmacy/location (including street and city if local pharmacy) is medication to be sent to?  [id number] - [place], [place] - [address]   5. Do they need a 30 day or 90 day supply?   90 day  Husband (Archie) stated patient only has 1 tablet left.  Patient has appointment scheduled on 12/10.

## 2023-07-21 NOTE — Telephone Encounter (Signed)
Significant other states patient is completely out of medication and he will call back to schedule follow up when he is at home and has access to patient's schedule.

## 2023-07-21 NOTE — Telephone Encounter (Signed)
*  STAT* If patient is at the pharmacy, call can be transferred to refill team.   1. Which medications need to be refilled? (please list name of each medication and dose if known)  sacubitril-valsartan (ENTRESTO) 49-51 MG  2. Which pharmacy/location (including street and city if local pharmacy) is medication to be sent to? [id number] - [place], [place] - [address]  3. Do they need a 30 day or 90 day supply?   90 day supply

## 2023-07-21 NOTE — Telephone Encounter (Signed)
 RX sent to requested Pharmacy

## 2023-07-23 NOTE — Progress Notes (Unsigned)
Cardiology Clinic Note   [name] Date of Encounter: 07/23/2023  Primary Care Provider:  Fleet Contras, MD Primary Cardiologist:  Thurmon Fair, MD  Patient Profile    Dorothy Alvarez 57 year old female presents the clinic today for follow-up evaluation of her chest discomfort.  Past Medical History    Past Medical History:  Diagnosis Date   Allergy    Anxiety    Arthritis    Bipolar 1 disorder (HCC)    Chronic abdominal pain    Chronic combined systolic and diastolic CHF (congestive heart failure) (HCC)    Constipation    Depression    Heart failure (HCC)    Hyperlipidemia    Hypertension    IBS (irritable bowel syndrome)    Learning disability    MDD (major depressive disorder)    Memory loss    since her CVA   Metabolic encephalopathy    Neuromuscular disorder (HCC)    Nonischemic cardiomyopathy (HCC)    Rhabdomyolysis    Sepsis (HCC)    Stroke [facility])    Past Surgical History:  Procedure Laterality Date   ABDOMINAL HYSTERECTOMY     GALLBLADDER SURGERY     IR GASTROSTOMY TUBE MOD SED  08/07/2021   KNEE SURGERY     RIGHT/LEFT HEART CATH AND CORONARY ANGIOGRAPHY N/A 05/04/2020   Procedure: RIGHT/LEFT HEART CATH AND CORONARY ANGIOGRAPHY;  Surgeon: Lyn Records, MD;  Location: [facility];  Service: Cardiovascular;  Laterality: N/A;   ROBOTIC ASSITED PARTIAL NEPHRECTOMY Left 06/13/2022   Procedure: XI ROBOTIC ASSITED RADICAL NEPHRECTOMY;  Surgeon: Jannifer Hick, MD;  Location: [facility];  Service: Urology;  Laterality: Left;  ONLY NEEDS 240 MIN    Allergies  Allergies  Allergen Reactions   Ketorolac Other (See Comments)    Anxiety and confusion   Morphine And Codeine Other (See Comments)    Mental status changes (confusion and anxiety)   Nsaids Nausea Only   Penicillins Itching    Has patient had a PCN reaction causing immediate rash, facial/tongue/throat swelling, SOB or lightheadedness with hypotension: Yes Has patient had a PCN  reaction causing severe rash involving mucus membranes or skin necrosis: No Has patient had a PCN reaction that required hospitalization No Has patient had a PCN reaction occurring within the last 10 years: No If all of the above answers are "NO", then may proceed with Cephalosporin use.    Trazodone And Nefazodone Other (See Comments)    Restlessness and opposite/paradoxical reaction/effect    History of Present Illness    Dorothy Alvarez has a PMH of normal coronary anatomy via cardiac catheterization 9/21, nonischemic cardiomyopathy, chronic systolic CHF with an EF of 20% on most recent echo 12/22, multiple CVA, hyperlipidemia, HTN, left renal mass noted on CT 2/23, possible bipolar disorder, depression, anxiety, and vascular dementia.  She was admitted with CHF symptoms on 9/21.  She complained of dyspnea on exertion, orthopnea, lower extremity swelling, echocardiogram showed LVEF of 25-30% with global hypokinesis, mild LVH, grade 3 DD, and mild MR with mildly elevated PASP.  She underwent right and left heart cath which showed widely patent arteries and moderately elevated pulmonary hypertension numbers.  She was diuresed and started on GDMT.  Outpatient study was recommended.  She was lost to follow-up after hospitalization and 100% no-show rate to the cardiology office.  She was seen by Marjie Skiff, PA-C on 01/08/2022.  During that time she was doing well from a cardiac standpoint.  She has  been weaned off clonidine in an effort to optimize GDMT.  Her repeat echocardiogram showed an LVEF of 50-55%, severe LVH, G1 DD, and normal RV with mildly reduced RV systolic function and severely dilated left atrium with small pericardial effusion.  She was seen in follow-up by Marjie Skiff, PA-C on 02/11/2022.  She presented for follow-up with her daughter.  She was doing well.  She denied cardiac symptoms.  She denied chest pain shortness of breath, PND, orthopnea, lower extremity swelling,  palpitations, lightheadedness, presyncope and syncope.  Her weight was stable.  Her blood pressure was well controlled after weaning off clonidine.  She presented to the emergency department on 02/15/2022 with complaints of atypical chest pain.  She described her pain as sharp in nature.  Her EKG showed sinus rhythm with frequent PAC complexes in a bigeminy pattern with possible left atrial enlargement 80 bpm.  She also reported occasional tingling in her left hand.  During her initial presentation before her husband called 911 she did report that she felt lightheaded.  Her Eliquis was discussed.  She had not yet started to wear her cardiac event monitor and was continued on Eliquis.  She had not missed any doses.  She presented to the clinic 03/03/22 for follow-up evaluation with her daughter  and stated she had noticed some increased urination since decreasing furosemide and increasing Entresto.  She brought in her cardiac event monitor.  Her daughter reported that she had lost around 10 pounds since she was last seen.  She reported that her appetite had been decreased.  I continued her current medication regimen planned lab work.  Planned to wait on her cardiac event monitor. She reported no further episodes of chest discomfort.  We reviewed PACs and her ED visit.  She was seen in follow-up by Marjie Skiff, PA-C 07/21/2022.  She reported that she had undergone robotic assisted laparoscopic radical left nephrectomy with left adrenalectomy 06/13/2022.  Procedure was completed for left renal mass concerning for CVA.  Her procedure went well and she was discharged on 06/16/2022.  She presented for follow-up with her husband.  She remained stable from a cardiac standpoint.  She denied chest pain and shortness of breath.  Her weight was down about 14 pounds since her 9/23 visit.  She had not had her FDG-PET scan.  She reported that [facility] had contacted her to schedule the appointment around the same time as her  renal surgery.  They were awaiting return call from [facility].  She did have some residual memory deficits since her stroke.  She did ask repetitive questions throughout the visit.  She presents to the clinic today for follow-up evaluation and states***.  Today she denies chest pain, shortness of breath, lower extremity edema, fatigue, palpitations, melena, hematuria, hemoptysis, diaphoresis, weakness, presyncope, syncope, orthopnea, and PND.   Home Medications    Prior to Admission medications   Medication Sig Start Date End Date Taking? Authorizing Provider  apixaban (ELIQUIS) 5 MG TABS tablet Take 1 tablet (5 mg total) by mouth 2 (two) times daily. 12/09/21   Massengill, Harrold Donath, MD  atorvastatin (LIPITOR) 80 MG tablet Take 1 tablet (80 mg total) by mouth daily. 12/10/21   Massengill, Harrold Donath, MD  carvedilol (COREG) 12.5 MG tablet Take 1 tablet (12.5 mg total) by mouth 2 (two) times daily with a meal. 02/21/22   Croitoru, Mihai, MD  furosemide (LASIX) 40 MG tablet Take 1 tablet (40 mg total) by mouth daily for 120 doses. 02/12/22 06/12/22  Irene Limbo,  Callie E, PA-C  isosorbide dinitrate (ISORDIL) 10 MG tablet Take 1 tablet (10 mg total) by mouth 3 (three) times daily. 12/09/21   Massengill, Harrold Donath, MD  risperiDONE (RISPERDAL M-TABS) 0.5 MG disintegrating tablet Take 1 tablet (0.5 mg total) by mouth at bedtime. 12/09/21 01/08/22  Massengill, Harrold Donath, MD  sacubitril-valsartan (ENTRESTO) 49-51 MG Take 1 tablet by mouth 2 (two) times daily. 02/12/22   Marjie Skiff E, PA-C  sertraline (ZOLOFT) 50 MG tablet Take 1 tablet (50 mg total) by mouth daily. 12/10/21 01/09/22  Phineas Inches, MD    Family History    Family History  Problem Relation Age of Onset   Diabetes Mother    Suicidality Mother    Transient ischemic attack Mother    Heart disease Father    Depression Father    Stroke Father    She indicated that her mother is alive. She indicated that her father is deceased.  Social History     Social History   Socioeconomic History   Marital status: Legally Separated    Spouse name: Not on file   Number of children: 2   Years of education: Not on file   Highest education level: Not on file  Occupational History   Occupation: DISABLED    Employer: UNEMPLOYED  Tobacco Use   Smoking status: Never   Smokeless tobacco: Never  Vaping Use   Vaping status: Never Used  Substance and Sexual Activity   Alcohol use: No   Drug use: Yes    Types: Other-see comments, Marijuana    Comment: opiates   Sexual activity: Not Currently    Birth control/protection: Surgical  Other Topics Concern   Not on file  Social History Narrative   Not on file   Social Determinants of Health   Financial Resource Strain: Not on file  Food Insecurity: No Food Insecurity (06/13/2022)   Hunger Vital Sign    Worried About Running Out of Food in the Last Year: Never true    Ran Out of Food in the Last Year: Never true  Transportation Needs: No Transportation Needs (06/13/2022)   PRAPARE - Administrator, Civil Service (Medical): No    Lack of Transportation (Non-Medical): No  Physical Activity: Not on file  Stress: Not on file  Social Connections: Not on file  Intimate Partner Violence: Not At Risk (06/13/2022)   Humiliation, Afraid, Rape, and Kick questionnaire    Fear of Current or Ex-Partner: No    Emotionally Abused: No    Physically Abused: No    Sexually Abused: No     Review of Systems    General:  No chills, fever, night sweats or weight changes.  Cardiovascular:  No chest pain, dyspnea on exertion, edema, orthopnea, palpitations, paroxysmal nocturnal dyspnea. Dermatological: No rash, lesions/masses Respiratory: No cough, dyspnea Urologic: No hematuria, dysuria Abdominal:   No nausea, vomiting, diarrhea, bright red blood per rectum, melena, or hematemesis Neurologic:  No visual changes, wkns, changes in mental status. All other systems reviewed and are otherwise  negative except as noted above.  Physical Exam    VS:  There were no vitals taken for this visit. , BMI There is no height or weight on file to calculate BMI. GEN: Well nourished, well developed, in no acute distress. HEENT: normal. Neck: Supple, no JVD, carotid bruits, or masses. Cardiac: RRR, no murmurs, rubs, or gallops. No clubbing, cyanosis, edema.  Radials/DP/PT 2+ and equal bilaterally.  Respiratory:  Respirations regular and unlabored, clear to  auscultation bilaterally. GI: Soft, nontender, nondistended, BS + x 4. MS: no deformity or atrophy. Skin: warm and dry, no rash. Neuro:  Strength and sensation are intact. Psych: Normal affect.  Accessory Clinical Findings    Recent Labs: 03/17/2023: ALT 18; BUN 20; Creat 1.43; Hemoglobin 12.9; Platelets 210; Potassium 4.2; Sodium 144; TSH 2.52   Recent Lipid Panel    Component Value Date/Time   CHOL 125 03/17/2023 0000   CHOL 129 03/04/2022 0858   TRIG 68 03/17/2023 0000   HDL 54 03/17/2023 0000   HDL 36 (L) 03/04/2022 0858   CHOLHDL 2.3 03/17/2023 0000   VLDL 24 07/31/2021 0344   LDLCALC 57 03/17/2023 0000    ECG personally reviewed by me today-none today.  Right/Left Cardiac Catheterization 05/04/2020: Widely patent coronary arteries. Acute on chronic combined systolic and diastolic heart failure with mean wedge 18 mmHg and LVEDP 20 mmHg Moderate pulmonary hypertension, mean PAP 35 mmHg; . WHO Group II (Hemodynamic classification: Pulmonary capillary wedge pressure>15; pulmonary vascular resistance > 3 WU)   Recommendations: Guideline directed therapy for systolic heart failure. Consider obstructive sleep apnea as a contributor to pulmonary hypertension given the elevated vascular resistance.   Diagnostic Dominance: Right  _______________   Echocardiogram 07/31/2021: Impressions:  1. Left ventricular ejection fraction, by estimation, is at best 20%. The  left ventricle has severely decreased function. The left  ventricle  demonstrates global hypokinesis. There is mild left ventricular  hypertrophy. Left ventricular diastolic  parameters are indeterminate. There is LV trabeculation without thrombus.   2. Right ventricular systolic function is moderately reduced. The right  ventricular size is normal. There is severely elevated pulmonary artery  systolic pressure. The estimated right ventricular systolic pressure is  65.7 mmHg.   3. Left atrial size was mildly dilated.   4. The mitral valve is normal in structure. Mild mitral valve  regurgitation. No evidence of mitral stenosis.   5. Tricuspid valve regurgitation is moderate to severe.   6. The aortic valve is tricuspid. There is mild thickening of the aortic  valve. Aortic valve regurgitation is not visualized. Aortic valve  sclerosis is present, with no evidence of aortic valve stenosis.   7. The inferior vena cava is dilated in size with <50% respiratory  variability, suggesting right atrial pressure of 15 mmHg.   Comparison(s): A prior study was performed on 05/02/2020. Further decrease in LV function.   Echocardiogram 01/17/2022  1. Left ventricular ejection fraction, by estimation, is 50 to 55%. The  left ventricle has low normal function. The left ventricle has no regional  wall motion abnormalities. There is severe concentric left ventricular  hypertrophy. Left ventricular  diastolic parameters are consistent with Grade I diastolic dysfunction  (impaired relaxation).   2. Right ventricular systolic function is mildly reduced. The right  ventricular size is normal. There is normal pulmonary artery systolic  pressure.   3. Left atrial size was severely dilated.   4. A small pericardial effusion is present. The pericardial effusion is  anterior to the right ventricle.   5. The mitral valve is normal in structure. Trivial mitral valve  regurgitation. No evidence of mitral stenosis.   6. The aortic valve is tricuspid. Aortic valve  regurgitation is not  visualized. No aortic stenosis is present.   7. Aortic dilatation noted. There is borderline dilatation of the  ascending aorta, measuring 36 mm.   8. The inferior vena cava is normal in size with greater than 50%  respiratory variability, suggesting right  atrial pressure of 3 mmHg.   Comparison(s): The left ventricular function has improved. EF 20%, mild  LVH, mod-sev TR, RVSP 65.18mmHg.  _______________   Event Monitor 02/2022:   The dominant rhythm is normal sinus, with normal circadian variation.   Very frequent premature ventricular contractions are seen, representing 22% of all the captured beats.  Most of the PVCs have the same morphology.  They commonly occur in a pattern of bigeminy which sometimes was present for prolonged periods of time.   There is a single episode of 9 beat, 6-second nonsustained ventricular tachycardia.  The QRS complex morphology during nonsustained ventricular tachycardia is different from the dominant PVC morphology.   There are rare premature atrial contractions, there is no evidence of complex atrial arrhythmia, including no evidence of atrial fibrillation.   There is no evidence of significant bradycardia.   No symptom-related recordings were reported.   Abnormal 7-day event monitor due to the presence of very frequent PVCs (22% of all beats), commonly occurring in a pattern of bigeminy.  A single 9 beat episode of nonsustained ventricular tachycardia was seen. _______________   Cardiac MRI 04/16/2022: Impressions: 1. Normal LV size with mild LV hypertrophy. Regional wall motion abnormalities as noted above. 2.  Normal RV size and systolic function, EF 53%. 3. Elevated ECV percentage is likely reflective of the areas of myocardial LGE. 4. LGE pattern as noted above. This looks like a coronary pattern (myocardial infarction) involving RCA and LCx territories. 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     Assessment & Plan   1.Nonischemic cardiomyopathy-weight stable.  No lower extremity edema.  Cardiac MRI 8/23 showed EF of 53%, LGE pattern was felt to be likely a coronary pattern involving RCA and circumflex.  It was felt that if coronary arteries were normal cardiac sarcoidosis should be considered.  [facility]PET was previously pending at [facility] for [facility] evaluation.   No increased DOE. Continue furosemide, Entresto, metoprolol, Isodril Heart healthy low-sodium diet-May increase caloric intake Increase physical activity as tolerated Repeat CMP.  Chronic combined systolic and diastolic CHF-euvolemic today.  No increased DOE or activity intolerance. Continue furosemide, Entresto, metoprolol, Isodril Heart healthy low-sodium diet Increase physical activity as tolerated  Essential hypertension-BP today 11***9/78.   Continue metoprolol, Entresto, Isodril Heart healthy low-sodium diet Increase physical activity as tolerated  Chest discomfort-she previously presented to the emergency department on 7/1***/2023 with throbbing type chest discomfort.  She also described lightheadedness.  Denies syncope orthopnea and PND.  Reports compliance with apixaban and denies bleeding issues.  Lab work 02/15/2022 showed normal hemoglobin and hematocrit.  Creatinine slightly elevated at 1.02.  EKG at that time showed frequent PVCs and normal sinus rhythm.  Atypical in nature.  Cardiac event monitor 03/19/2022 showed frequent premature ventricular complexes with 22% burden, no evidence of significant bradycardia, and a single episode of NSVT. No plans for further ischemic evaluation Await result of 14 day cardiac event monitor  Hyperlipidemia-LDL 141 on 12/22 Continue atorvastatin, ezetimibe Heart healthy low-sodium diet-salty 6 given Increase physical activity as tolerated Repeat fasting lipids  History of CVA-memory deficits.  Admitted 12/22 with acute CVA and was found to have bilateral multifocal CVA with  infarcts in left PCA, right cerebellum, bilateral frontal and right thalamus.  TEE showed LVEF of 20% with LV trabeculation and no thrombus.  Repeat echocardiogram showed normalization of her ejection fraction. Continue atorvastatin, ezetimibe Follows with PCP, neurology We will await results of cardiac event monitor   Disposition: Follow-up in 12  months with Dr. Royann Shivers or Marjie Skiff, PA-C or me.  Thomasene Ripple. Dericka Ostenson NP-C     07/23/2023, 11:54 AM [facility] [address] Office [phone] Fax [phone]  Notice: This dictation was prepared with Dragon dictation along with smaller phrase technology. Any transcriptional errors that result from this process are unintentional and may not be corrected upon review.  I spent 14*** minutes examining this patient, reviewing medications, and using patient centered shared decision making involving her cardiac care.  Prior to her visit I spent greater than 20 minutes reviewing her past medical history,  medications, and prior cardiac tests.

## 2023-07-28 ENCOUNTER — Encounter: Payer: Self-pay | Admitting: General Practice

## 2023-07-28 ENCOUNTER — Ambulatory Visit: Payer: 59 | Attending: General Practice | Admitting: General Practice

## 2023-07-28 VITALS — BP 126/74 | HR 55 | Ht 65.0 in | Wt 224.6 lb

## 2023-07-28 DIAGNOSIS — I10 Essential (primary) hypertension: Secondary | ICD-10-CM

## 2023-07-28 DIAGNOSIS — I428 Other cardiomyopathies: Secondary | ICD-10-CM | POA: Diagnosis not present

## 2023-07-28 DIAGNOSIS — I639 Cerebral infarction, unspecified: Secondary | ICD-10-CM

## 2023-07-28 DIAGNOSIS — R0789 Other chest pain: Secondary | ICD-10-CM

## 2023-07-28 DIAGNOSIS — E785 Hyperlipidemia, unspecified: Secondary | ICD-10-CM

## 2023-07-28 DIAGNOSIS — I5042 Chronic combined systolic (congestive) and diastolic (congestive) heart failure: Secondary | ICD-10-CM | POA: Diagnosis not present

## 2023-07-28 NOTE — Patient Instructions (Signed)
Medication Instructions:  The current medical regimen is effective;  continue present plan and medications as directed. Please refer to the Current Medication list given to you today.  *If you need a refill on your cardiac medications before your next appointment, please call your pharmacy*  Lab Work: NONE  Other Instructions WALK MORE 10 MINUTES OR MORE PLEASE READ AND FOLLOW ATTACHED  SALTY 6  CONTINUE DAILY WEIGHTS  Follow-Up: At [facility], you and your health needs are our priority.  As part of our continuing mission to provide you with exceptional heart care, we have created designated Provider Care Teams.  These Care Teams include your primary Cardiologist (physician) and Advanced Practice Providers (APPs -  Physician Assistants and Nurse Practitioners) who all work together to provide you with the care you need, when you need it.  Your next appointment:   12 month(s)  Provider:   Thurmon Fair, MD

## 2023-08-01 ENCOUNTER — Other Ambulatory Visit: Payer: Self-pay | Admitting: Cardiovascular Disease

## 2023-08-11 ENCOUNTER — Other Ambulatory Visit: Payer: Self-pay | Admitting: Cardiovascular Disease

## 2023-11-22 ENCOUNTER — Other Ambulatory Visit: Payer: Self-pay | Admitting: Student

## 2024-04-22 ENCOUNTER — Telehealth: Payer: Self-pay | Admitting: Cardiovascular Disease

## 2024-04-22 DIAGNOSIS — I5042 Chronic combined systolic (congestive) and diastolic (congestive) heart failure: Secondary | ICD-10-CM

## 2024-04-22 DIAGNOSIS — I428 Other cardiomyopathies: Secondary | ICD-10-CM

## 2024-04-22 MED ORDER — FUROSEMIDE 40 MG PO TABS: 40.0000 mg | ORAL_TABLET | Freq: Every day | ORAL | 3 refills | Status: AC | PRN

## 2024-04-22 MED ORDER — POTASSIUM CHLORIDE 20 MEQ PO PACK: 20.0000 meq | PACK | Freq: Every day | ORAL | 3 refills | Status: AC | PRN

## 2024-04-22 NOTE — Telephone Encounter (Signed)
 Returned call to [employer] (dpr), she also got her father on the line. They report that the patient has not taken Lasix  or potassium in over a year.  Between the two of them, they reports the patient's feet and ankles were swollen yesterday, but it is much better today. Pt rode in the truck with husband yesterday for about 9 hours. Pt's weight has been staying steady at about 223. Heard patient in the background also- she denies shortness of breath, no redness, and no tenderness in legs.   Gave them the information below and will send in the medication to their preferred pharmacy. Appointment scheduled with APP for 05/04/24.  Looked in notes and found from 07/31/2022 from Callie Goodrich, PA-C- (Medication list was never changed) Let's have her decrease her Lasix  to 40mg  only as needed for weight gain (3lbs in 1 day or 5lbs in 1 week) or worsening lower extremity edema(SWELLING). She should also decrease her potassium supplement to 20 mEq only as needed for when she takes the Lasix . If patient notices that her weight is going up despite PRN Lasix , she should let us  know.    Given address of new location. They verbalized understanding of all information.

## 2024-04-22 NOTE — Telephone Encounter (Signed)
 Vicci Rollo SAUNDERS, PA-C to [facility] Triage    04/22/24  3:36 PM Agree with med changes- of note, does not look like patient has had labs since 02/2023. Please order a BMP to be collected Monday morning    Thanks  KJ    Called patient's DPR. Informed her to have patient come in on Monday for a BMET. Will send [employer] locations.

## 2024-04-22 NOTE — Telephone Encounter (Signed)
 Pt c/o swelling/edema: STAT if pt has developed SOB within 24 hours  If swelling, where is the swelling located? Feet and ankles  How much weight have you gained and in what time span? NA  Have you gained 2 pounds in a day or 5 pounds in a week? Yes  Do you have a log of your daily weights (if so, list)? 200 lbs  Are you currently taking a fluid pill? No- on current medication list but was told to stop taking  Are you currently SOB? No  Have you traveled recently in a car or plane for an extended period of time?

## 2024-05-03 NOTE — Progress Notes (Addendum)
 Cardiology Clinic Note   [name] Date of Encounter: 05/04/2024  Primary Care Provider:  Shelda Atlas, MD Primary Cardiologist:  Jerel Balding, MD  Patient Profile    Dorothy Alvarez 58 year old female presents the clinic today for follow-up evaluation of her lower extremity swelling.   Past Medical History    Past Medical History:  Diagnosis Date   Allergy    Anxiety    Arthritis    Bipolar 1 disorder (HCC)    Chronic abdominal pain    Chronic combined systolic and diastolic CHF (congestive heart failure) (HCC)    Constipation    Depression    Heart failure (HCC)    Hyperlipidemia    Hypertension    IBS (irritable bowel syndrome)    Learning disability    MDD (major depressive disorder)    Memory loss    since her CVA   Metabolic encephalopathy    Neuromuscular disorder (HCC)    Nonischemic cardiomyopathy (HCC)    Rhabdomyolysis    Sepsis (HCC)    Stroke [facility])    Past Surgical History:  Procedure Laterality Date   ABDOMINAL HYSTERECTOMY     GALLBLADDER SURGERY     IR GASTROSTOMY TUBE MOD SED  08/07/2021   KNEE SURGERY     RIGHT/LEFT HEART CATH AND CORONARY ANGIOGRAPHY N/A 05/04/2020   Procedure: RIGHT/LEFT HEART CATH AND CORONARY ANGIOGRAPHY;  Surgeon: Claudene Victory ORN, MD;  Location: [facility] INVASIVE CV LAB;  Service: Cardiovascular;  Laterality: N/A;   ROBOTIC ASSITED PARTIAL NEPHRECTOMY Left 06/13/2022   Procedure: XI ROBOTIC ASSITED RADICAL NEPHRECTOMY;  Surgeon: Selma Donnice SAUNDERS, MD;  Location: [facility] OR[facility];  Service: Urology;  Laterality: Left;  ONLY NEEDS 240 MIN    Allergies  Allergies  Allergen Reactions   Ketorolac  Other (See Comments)    Anxiety and confusion   Morphine And Codeine Other (See Comments)    Mental status changes (confusion and anxiety)   Nsaids Nausea Only   Penicillins Itching    Has patient had a PCN reaction causing immediate rash, facial/tongue/throat swelling, SOB or lightheadedness with hypotension: Yes Has patient  had a PCN reaction causing severe rash involving mucus membranes or skin necrosis: No Has patient had a PCN reaction that required hospitalization No Has patient had a PCN reaction occurring within the last 10 years: No If all of the above answers are NO, then may proceed with Cephalosporin use.    Trazodone  And Nefazodone Other (See Comments)    Restlessness and opposite/paradoxical reaction/effect    History of Present Illness    Dorothy Alvarez has a PMH of normal coronary anatomy via cardiac catheterization 9/21, nonischemic cardiomyopathy, chronic systolic CHF with an EF of 20% on most recent echo 12/22, multiple CVA, hyperlipidemia, HTN, left renal mass noted on CT 2/23, possible bipolar disorder, depression, anxiety, and vascular dementia.  She was admitted with CHF symptoms on 9/21.  She complained of dyspnea on exertion, orthopnea, lower extremity swelling, echocardiogram showed LVEF of 25-30% with global hypokinesis, mild LVH, grade 3 DD, and mild MR with mildly elevated PASP.  She underwent right and left heart cath which showed widely patent arteries and moderately elevated pulmonary hypertension numbers.  She was diuresed and started on GDMT.  Outpatient study was recommended.  She was lost to follow-up after hospitalization and 100% no-show rate to the cardiology office.  She was seen by Aline Door, PA-C on 01/08/2022.  During that time she was doing well from a cardiac standpoint.  She has been weaned off clonidine  in an effort to optimize GDMT.  Her repeat echocardiogram showed an LVEF of 50-55%, severe LVH, G1 DD, and normal RV with mildly reduced RV systolic function and severely dilated left atrium with small pericardial effusion.  She was seen in follow-up by Aline Door, PA-C on 02/11/2022.  She presented for follow-up with her daughter.  She was doing well.  She denied cardiac symptoms.  She denied chest pain shortness of breath, PND, orthopnea, lower extremity swelling,  palpitations, lightheadedness, presyncope and syncope.  Her weight was stable.  Her blood pressure was well controlled after weaning off clonidine .  She presented to the emergency department on 02/15/2022 with complaints of atypical chest pain.  She described her pain as sharp in nature.  Her EKG showed sinus rhythm with frequent PAC complexes in a bigeminy pattern with possible left atrial enlargement 80 bpm.  She also reported occasional tingling in her left hand.  During her initial presentation before her husband called 911 she did report that she felt lightheaded.  Her Eliquis  was discussed.  She had not yet started to wear her cardiac event monitor and was continued on Eliquis .  She had not missed any doses.  She presented to the clinic 03/03/22 for follow-up evaluation with her daughter  and stated she had noticed some increased urination since decreasing furosemide  and increasing Entresto .  She brought in her cardiac event monitor.  Her daughter reported that she had lost around 10 pounds since she was last seen.  She reported that her appetite had been decreased.  I continued her current medication regimen planned lab work.  Planned to wait on her cardiac event monitor. She reported no further episodes of chest discomfort.  We reviewed PACs and her ED visit.  She was seen in follow-up by Aline Door, PA-C 07/21/2022.  She reported that she had undergone robotic assisted laparoscopic radical left nephrectomy with left adrenalectomy 06/13/2022.  Procedure was completed for left renal mass concerning for CVA.  Her procedure went well and she was discharged on 06/16/2022.  She presented for follow-up with her husband.  She remained stable from a cardiac standpoint.  She denied chest pain and shortness of breath.  Her weight was down about 14 pounds since her 9/23 visit.  She had not had her FDG-PET scan.  She reported that [facility] had contacted her to schedule the appointment around the same time as her  renal surgery.  They were awaiting return call from [facility]  She did have some residual memory deficits since her stroke.  She did ask repetitive questions throughout the visit.  She presented to the clinic 07/28/23 for follow-up evaluation and stated she had been doing well from a cardiac standpoint.  She presented with her husband.  She was fairly sedentary.  She reported compliance with her medications.  Her blood pressure had been somewhat elevated at home.  In the clinic  her blood pressure was 126/74.  Her husband questioned her digital cuff.  I asked them to bring their cuff to the pharmacy or their next doctor's appointment for verification.  They expressed understanding.  I reviewed her previous cardiac visits.  We reviewed her  lab work.  Her LDL cholesterol was noted to be 57 on 03/17/2023.  I asked her to continue her medication regimen and planned follow-up in 12 months.  I encouraged her to walk daily.  She contacted nurse triage line on 04/22/2024.  She reported increased lower extremity swelling.  She noted that she had not taken Lasix  or potassium in over a year.  Her Lasix  had been reduced to 40 mg and instructions were to take as needed with 20 mill equivalents of potassium as needed.  She presents to the clinic today for evaluation and states her swelling decreased without as needed medication.  She presented with her husband who reported that after a long ride in his truck she developed swelling.  We reviewed the importance of physical activity, lower extremity compression, and as needed medication.  They expressed understanding.  She continues to have memory impairment from previous CVA.  They ask for recommendations on GYN and PCP providers.  We will provide these.  I will continue her current medication regimen.  Her blood pressure today is 126/82.  Her heart rate is 62.  She is well compensated.  Today she denies chest pain, shortness of breath, lower extremity edema, fatigue,  palpitations, melena, hematuria, hemoptysis, diaphoresis, weakness, presyncope, syncope, orthopnea, and PND.   Home Medications    Prior to Admission medications   Medication Sig Start Date End Date Taking? Authorizing Provider  apixaban  (ELIQUIS ) 5 MG TABS tablet Take 1 tablet (5 mg total) by mouth 2 (two) times daily. 12/09/21   Massengill, Rankin, MD  atorvastatin  (LIPITOR ) 80 MG tablet Take 1 tablet (80 mg total) by mouth daily. 12/10/21   Massengill, Rankin, MD  carvedilol  (COREG ) 12.5 MG tablet Take 1 tablet (12.5 mg total) by mouth 2 (two) times daily with a meal. 02/21/22   Croitoru, Mihai, MD  furosemide  (LASIX ) 40 MG tablet Take 1 tablet (40 mg total) by mouth daily for 120 doses. 02/12/22 06/12/22  Goodrich, Callie E, PA-C  isosorbide  dinitrate (ISORDIL ) 10 MG tablet Take 1 tablet (10 mg total) by mouth 3 (three) times daily. 12/09/21   Massengill, Rankin, MD  risperiDONE  (RISPERDAL  M-TABS) 0.5 MG disintegrating tablet Take 1 tablet (0.5 mg total) by mouth at bedtime. 12/09/21 01/08/22  Massengill, Rankin, MD  sacubitril -valsartan  (ENTRESTO ) 49-51 MG Take 1 tablet by mouth 2 (two) times daily. 02/12/22   Goodrich, Callie E, PA-C  sertraline  (ZOLOFT ) 50 MG tablet Take 1 tablet (50 mg total) by mouth daily. 12/10/21 01/09/22  Johny Rankin, MD    Family History    Family History  Problem Relation Age of Onset   Diabetes Mother    Suicidality Mother    Transient ischemic attack Mother    Heart disease Father    Depression Father    Stroke Father    She indicated that her mother is alive. She indicated that her father is deceased.  Social History    Social History   Socioeconomic History   Marital status: Legally Separated    Spouse name: Not on file   Number of children: 2   Years of education: Not on file   Highest education level: Not on file  Occupational History   Occupation: DISABLED    Employer: UNEMPLOYED  Tobacco Use   Smoking status: Never   Smokeless tobacco:  Never  Vaping Use   Vaping status: Never Used  Substance and Sexual Activity   Alcohol use: No   Drug use: Yes    Types: Other-see comments, Marijuana    Comment: opiates   Sexual activity: Not Currently    Birth control/protection: Surgical  Other Topics Concern   Not on file  Social History Narrative   Not on file   Social Drivers of Health   Financial Resource Strain: Not on file  Food Insecurity: No Food Insecurity (06/13/2022)   Hunger Vital Sign    Worried About Running Out of Food in the Last Year: Never true    Ran Out of Food in the Last Year: Never true  Transportation Needs: No Transportation Needs (06/13/2022)   PRAPARE - Administrator, Civil Service (Medical): No    Lack of Transportation (Non-Medical): No  Physical Activity: Not on file  Stress: Not on file  Social Connections: Not on file  Intimate Partner Violence: Not At Risk (06/13/2022)   Humiliation, Afraid, Rape, and Kick questionnaire    Fear of Current or Ex-Partner: No    Emotionally Abused: No    Physically Abused: No    Sexually Abused: No     Review of Systems    General:  No chills, fever, night sweats or weight changes.  Cardiovascular:  No chest pain, dyspnea on exertion, edema, orthopnea, palpitations, paroxysmal nocturnal dyspnea. Dermatological: No rash, lesions/masses Respiratory: No cough, dyspnea Urologic: No hematuria, dysuria Abdominal:   No nausea, vomiting, diarrhea, bright red blood per rectum, melena, or hematemesis Neurologic:  No visual changes, wkns, changes in mental status. All other systems reviewed and are otherwise negative except as noted above.  Physical Exam    VS:  BP 126/82   Pulse 61   Ht 5' 5 (1.651 m)   Wt 232 lb 6.4 oz (105.4 kg)   SpO2 98%   BMI 38.67 kg/m  , BMI Body mass index is 38.67 kg/m. GEN: Well nourished, well developed, in no acute distress. HEENT: normal. Neck: Supple, no JVD, carotid bruits, or masses. Cardiac: RRR, no  murmurs, rubs, or gallops. No clubbing, cyanosis, edema.  Radials/DP/PT 2+ and equal bilaterally.  Respiratory:  Respirations regular and unlabored, clear to auscultation bilaterally. GI: Soft, nontender, nondistended, BS + x 4. MS: no deformity or atrophy. Skin: warm and dry, no rash. Neuro:  Strength and sensation are intact. Psych: Normal affect.  Accessory Clinical Findings    Recent Labs: No results found for requested labs within last 365 days.   Recent Lipid Panel    Component Value Date/Time   CHOL 125 03/17/2023 0000   CHOL 129 03/04/2022 0858   TRIG 68 03/17/2023 0000   HDL 54 03/17/2023 0000   HDL 36 (L) 03/04/2022 0858   CHOLHDL 2.3 03/17/2023 0000   VLDL 24 07/31/2021 0344   LDLCALC 57 03/17/2023 0000    ECG personally reviewed by me today-none today.  Right/Left Cardiac Catheterization 05/04/2020: Widely patent coronary arteries. Acute on chronic combined systolic and diastolic heart failure with mean wedge 18 mmHg and LVEDP 20 mmHg Moderate pulmonary hypertension, mean PAP 35 mmHg; . WHO Group II (Hemodynamic classification: Pulmonary capillary wedge pressure>15; pulmonary vascular resistance > 3 WU)   Recommendations: Guideline directed therapy for systolic heart failure. Consider obstructive sleep apnea as a contributor to pulmonary hypertension given the elevated vascular resistance.   Diagnostic Dominance: Right  _______________   Echocardiogram 07/31/2021: Impressions:  1. Left ventricular ejection fraction, by estimation, is at best 20%. The  left ventricle has severely decreased function. The left ventricle  demonstrates global hypokinesis. There is mild left ventricular  hypertrophy. Left ventricular diastolic  parameters are indeterminate. There is LV trabeculation without thrombus.   2. Right ventricular systolic function is moderately reduced. The right  ventricular size is normal. There is severely elevated pulmonary artery  systolic  pressure. The estimated right ventricular systolic pressure is  65.7 mmHg.   3. Left  atrial size was mildly dilated.   4. The mitral valve is normal in structure. Mild mitral valve  regurgitation. No evidence of mitral stenosis.   5. Tricuspid valve regurgitation is moderate to severe.   6. The aortic valve is tricuspid. There is mild thickening of the aortic  valve. Aortic valve regurgitation is not visualized. Aortic valve  sclerosis is present, with no evidence of aortic valve stenosis.   7. The inferior vena cava is dilated in size with <50% respiratory  variability, suggesting right atrial pressure of 15 mmHg.   Comparison(s): A prior study was performed on 05/02/2020. Further decrease in LV function.   Echocardiogram 01/17/2022  1. Left ventricular ejection fraction, by estimation, is 50 to 55%. The  left ventricle has low normal function. The left ventricle has no regional  wall motion abnormalities. There is severe concentric left ventricular  hypertrophy. Left ventricular  diastolic parameters are consistent with Grade I diastolic dysfunction  (impaired relaxation).   2. Right ventricular systolic function is mildly reduced. The right  ventricular size is normal. There is normal pulmonary artery systolic  pressure.   3. Left atrial size was severely dilated.   4. A small pericardial effusion is present. The pericardial effusion is  anterior to the right ventricle.   5. The mitral valve is normal in structure. Trivial mitral valve  regurgitation. No evidence of mitral stenosis.   6. The aortic valve is tricuspid. Aortic valve regurgitation is not  visualized. No aortic stenosis is present.   7. Aortic dilatation noted. There is borderline dilatation of the  ascending aorta, measuring 36 mm.   8. The inferior vena cava is normal in size with greater than 50%  respiratory variability, suggesting right atrial pressure of 3 mmHg.   Comparison(s): The left ventricular function  has improved. EF 20%, mild  LVH, mod-sev TR, RVSP 65.26mmHg.  _______________   Event Monitor 02/2022:   The dominant rhythm is normal sinus, with normal circadian variation.   Very frequent premature ventricular contractions are seen, representing 22% of all the captured beats.  Most of the PVCs have the same morphology.  They commonly occur in a pattern of bigeminy which sometimes was present for prolonged periods of time.   There is a single episode of 9 beat, 6-second nonsustained ventricular tachycardia.  The QRS complex morphology during nonsustained ventricular tachycardia is different from the dominant PVC morphology.   There are rare premature atrial contractions, there is no evidence of complex atrial arrhythmia, including no evidence of atrial fibrillation.   There is no evidence of significant bradycardia.   No symptom-related recordings were reported.   Abnormal 7-day event monitor due to the presence of very frequent PVCs (22% of all beats), commonly occurring in a pattern of bigeminy.  A single 9 beat episode of nonsustained ventricular tachycardia was seen. _______________   Cardiac MRI 04/16/2022: Impressions: 1. Normal LV size with mild LV hypertrophy. Regional wall motion abnormalities as noted above. 2.  Normal RV size and systolic function, EF 53%. 3. Elevated ECV percentage is likely reflective of the areas of myocardial LGE. 4. LGE pattern as noted above. This looks like a coronary pattern (myocardial infarction) involving RCA and LCx territories. 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    Assessment & Plan   1.Chronic combined systolic and diastolic CHF-weight today 232.4.  Euvolemic.  Continues with no increased DOE or activity intolerance. Continue furosemide , Entresto , metoprolol , Isodril Heart healthy low-sodium diet-reviewed Increase physical activity as  tolerated Daily weights, maintain weight log Elevate lower extremities when not  active Lower extremity support stockings-support stocking sheet given  Nonischemic cardiomyopathy-somewhat physically active.  Stable.  Well compensated.  Cardiac MRI 8/23 showed EF of 53%, LGE pattern was felt to be likely a coronary pattern involving RCA and circumflex.  It was felt that if coronary arteries were normal cardiac sarcoidosis should be considered.  FDG-PET was previously pending at [facility] for further evaluation.   Continue furosemide , Entresto , metoprolol , Isodril Heart healthy low-sodium diet Increase physical activity as tolerated  Essential hypertension-BP today 126/82.  Continue metoprolol , Entresto , Isodril Heart healthy low-sodium diet Increase physical activity as tolerated  Hyperlipidemia-LDL 57 on 7/24.  Continue atorvastatin , ezetimibe  High-fiber diet Increase physical activity as tolerated Follows with PCP  History of CVA-memory deficits.  Admitted 12/22 with acute CVA and was found to have bilateral multifocal CVA with infarcts in left PCA, right cerebellum, bilateral frontal and right thalamus.  TEE showed LVEF of 20% with LV trabeculation and no thrombus.  Repeat echocardiogram showed normalization of her ejection fraction. Continue atorvastatin , ezetimibe  Follows with PCP, neurology    Disposition: Follow-up in  with Dr. Francyne or Callie Goodrich, PA-C or me in 9-12 months.  Josefa HERO. Lendy Dittrich NP-C     05/04/2024, 3:43 PM [facility] [address] Office [phone] Fax [phone]  Notice: This dictation was prepared with Dragon dictation along with smaller phrase technology. Any transcriptional errors that result from this process are unintentional and may not be corrected upon review.  I spent 14 minutes examining this patient, reviewing medications, and using patient centered shared decision making involving her cardiac care.  Prior to her visit I spent greater than 20 minutes reviewing her past medical  history,  medications, and prior cardiac tests.

## 2024-05-04 ENCOUNTER — Ambulatory Visit: Attending: General Practice | Admitting: General Practice

## 2024-05-04 ENCOUNTER — Encounter: Payer: Self-pay | Admitting: General Practice

## 2024-05-04 VITALS — BP 126/82 | HR 61 | Ht 65.0 in | Wt 232.4 lb

## 2024-05-04 DIAGNOSIS — I428 Other cardiomyopathies: Secondary | ICD-10-CM

## 2024-05-04 DIAGNOSIS — E785 Hyperlipidemia, unspecified: Secondary | ICD-10-CM | POA: Diagnosis not present

## 2024-05-04 DIAGNOSIS — I639 Cerebral infarction, unspecified: Secondary | ICD-10-CM

## 2024-05-04 DIAGNOSIS — Z7689 Persons encountering health services in other specified circumstances: Secondary | ICD-10-CM

## 2024-05-04 DIAGNOSIS — I5042 Chronic combined systolic (congestive) and diastolic (congestive) heart failure: Secondary | ICD-10-CM

## 2024-05-04 DIAGNOSIS — I10 Essential (primary) hypertension: Secondary | ICD-10-CM | POA: Diagnosis not present

## 2024-05-04 NOTE — Patient Instructions (Signed)
Medication Instructions:  Your physician recommends that you continue on your current medications as directed. Please refer to the Current Medication list given to you today.  *If you need a refill on your cardiac medications before your next appointment, please call your pharmacy*  Lab Work: NONE If you have labs (blood work) drawn today and your tests are completely normal, you will receive your results only by: MyChart Message (if you have MyChart) OR A paper copy in the mail If you have any lab test that is abnormal or we need to change your treatment, we will call you to review the results.  Testing/Procedures: NONE  Follow-Up: At [facility], you and your health needs are our priority.  As part of our continuing mission to provide you with exceptional heart care, our providers are all part of one team.  This team includes your primary Cardiologist (physician) and Advanced Practice Providers or APPs (Physician Assistants and Nurse Practitioners) who all work together to provide you with the care you need, when you need it.  Your next appointment:   9 month(s)  Provider:   Jerel Balding, MD or Josefa Beauvais, NP        Other Instructions Exercise regularly as told by your doctor. Make sure to weight daily and keep a weight log.   Moderate-intensity exercise is any activity that gets you moving enough to burn at least three times more energy (calories) than if you were sitting. Examples of moderate exercise include: Walking a mile in 15 minutes. Doing light yard work. Biking at an easy pace. Most people should get at least 150 minutes of moderate-intensity exercise a week to maintain their body weight.  Increase your water  intake: Maintain hydration    [employer]  Outlet Store  Training and development officer for [employer] Mailing Address:  [address];   [address]  [place], [place] [address]  Tel [phone] Fx [phone]     High Quality  Legwear for Today's Active Lifestyles Maximum Compression at the ankle. Compression lessens gradually up the leg.   We manufacture a wide range of compression hosiery for men and women in  different styles, constructions and levels of support.  How Compression Hosiery Works Regulatory affairs officer, [employer]. compression hosiery works by applying graduated pressure to the  muscles and veins in the legs.  When the calf muscle contracts such as during walking  the compression hosiery will "give" and then return to its original position. By doing so  the hosiery is assists your body's circulatory wellness.  The result is increased leg health and vitality.   Maximum Compression at the ankle Compression lessens gradually up the leg  We Offer: Sheer & Opaque Stockings       COLORS:  Nude, black, white and misc. prints Below Knee Thigh High Pantyhose  High Quality Legwear for Today's Active Lifestyles We manufacture a wide range of compression hosiery for men and women in different styles, construction sand levels of support.  Socks:                     Sheer & Opaque   Compression Levels Include:                                  Stockings Men's               Below Knee  8-15 mmHg   Women's         Thigh High                 15-20 mmHg  Unisex             Pantyhose                 20-30 mmHg                                                                      30-40 mmHg  4 Simple Ways to Order   Email  [email or web address] Mail/Email orders are subject to processing and handling charges. Allow 7-10 days for receipt.  Phone [phone]  Please allow 24 hours for return call.   In Person  We recommend calling prior to your visit to confirm store hours as they may change due to holiday, weather, and maintenance.   By Mail When placing an order, please have the following information available. Our representatives are available to assist.     Measurements    THIGH      in.   CALF         in.   ANKLE     in.    Compression  8-15 mmHg 15-20 mmHg**   20-30 mmHg 30-40 mmHg   WOMEN'S MEN'S  Shoe Size Sock Size Shoe Size Sock Size  4 - 5 Small 7.5 and Under Small  5.5 - 7.5 Medium 8 - 10 Medium  8 - 10 Large 10.5 - 12 Large  10.5 and Over X-Large 12.5 and Over X-Large   Knee High Size Chart  Length from CALF MEASUREMENT  floor to bend   in knee. 11 12 13 14 15 16 17 18 19 20  21" 22"  14 S S S S M M L L L XL XL XL  15 S S S M M L L L XL XL XXL XXL  16 S S M M M L L L XL XXL XXL XXL  17 S M M M M L L XL XL XXL XXL XXL  18 M M M M L L L XL XL XXL XXL XXL  19 M M M M L L XL XL XL XXL XXL XXL   Thigh High Circumference Sizing Chart                 S M L XL XXL  ANKLE 6.5 - 8 8 - 9.5 9.5 - 11 11 - 12.5 12.5 - 14  CALF 10.5 - 14.5 11.5 - 15.5 12.5 - 17 13.5 - 17.5 14.5 - 19.5  THIGH 15.5 - 22 17.5 - 24 19.5 - 26 22 - 28 26 - 32  HIP UP TO 40 UP TO 44 UP TO 48' UP TO 52 UP TO 56   Pantyhose Size Chart  Height Petite Medium Tall X-Tall Queen Queen +   Weight Weight Weight Weight Weight Weight  4'11 95-130 135      5'0 95-125 130-145   170-185   5'1 90-120 125-155 160-165  170-195   5'2 90-115 120-145 150-165  170-195   5'3 90-110 115-140 145-165  170-200 200-225  5'4 100-105 110-135 140-160 165 170-200  200-225  5'5 100 105-130 135-160 165 170-200 200-225  5'6  110-125 130-155 160-165 170-200 200-225  5'7  110-120 125-150 155-165 170-200 195-225  5'8   120-145 150-165 170-200 190-225  5'9   125-140 145-170 175-190 185-220  5'10   125-135 140-185  185-215  5'11   130-135 140-185  190-210             

## 2024-05-06 NOTE — Progress Notes (Signed)
 Dorothy Alvarez, if the FDG-PET was never done at [facility], we can now do here with out PET scanner at [facility]. Please reach out to Camie Shutter or Merle to schedule

## 2024-06-06 ENCOUNTER — Telehealth: Payer: Self-pay | Admitting: Cardiovascular Disease

## 2024-06-06 MED ORDER — ISOSORBIDE DINITRATE 10 MG PO TABS: 10.0000 mg | ORAL_TABLET | Freq: Three times a day (TID) | ORAL | 2 refills | Status: AC

## 2024-06-06 NOTE — Telephone Encounter (Signed)
*  STAT* If patient is at the pharmacy, call can be transferred to refill team.   1. Which medications need to be refilled? (please list name of each medication and dose if known)   isosorbide  dinitrate (ISORDIL ) 10 MG tablet     2. Would you like to learn more about the convenience, safety, & potential cost savings by using the [facility]? No    3. Are you open to using the [facility] (Type [facility]. No    4. Which pharmacy/location (including street and city if local pharmacy) is medication to be sent to? [id number] [place], [place] - [address]     5. Do they need a 30 day or 90 day supply? 90 day   Pt is out of medication.  Pts PCP said to call this office since it is a cardio drug. Please advise.

## 2024-08-14 ENCOUNTER — Other Ambulatory Visit: Payer: Self-pay | Admitting: Cardiovascular Disease

## 2024-10-27 ENCOUNTER — Encounter: Payer: Self-pay | Admitting: Obstetrics and Gynecology
